# Patient Record
Sex: Female | Born: 1990 | Race: Black or African American | Hispanic: No | Marital: Single | Smoking: Former smoker
Health system: Southern US, Community
[De-identification: ages and names within clinical notes are randomized; demographics above are authoritative.]

## PROBLEM LIST (undated history)

## (undated) DIAGNOSIS — F419 Anxiety disorder, unspecified: Secondary | ICD-10-CM

## (undated) DIAGNOSIS — M722 Plantar fascial fibromatosis: Secondary | ICD-10-CM

## (undated) DIAGNOSIS — E282 Polycystic ovarian syndrome: Secondary | ICD-10-CM

## (undated) DIAGNOSIS — K219 Gastro-esophageal reflux disease without esophagitis: Secondary | ICD-10-CM

## (undated) DIAGNOSIS — M5416 Radiculopathy, lumbar region: Secondary | ICD-10-CM

## (undated) DIAGNOSIS — D649 Anemia, unspecified: Secondary | ICD-10-CM

## (undated) DIAGNOSIS — R51 Headache: Secondary | ICD-10-CM

## (undated) DIAGNOSIS — R519 Headache, unspecified: Secondary | ICD-10-CM

## (undated) DIAGNOSIS — F32A Depression, unspecified: Secondary | ICD-10-CM

## (undated) HISTORY — PX: SMALL INTESTINE SURGERY: SHX150

## (undated) HISTORY — PX: FOOT SURGERY: SHX648

## (undated) HISTORY — PX: WISDOM TOOTH EXTRACTION: SHX21

## (undated) HISTORY — PX: HERNIA REPAIR: SHX51

---
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
## 2008-05-24 ENCOUNTER — Emergency Department: Payer: Self-pay | Admitting: Emergency Medicine

## 2008-07-10 ENCOUNTER — Emergency Department: Payer: Self-pay | Admitting: Emergency Medicine

## 2008-09-01 ENCOUNTER — Emergency Department: Payer: Self-pay | Admitting: Emergency Medicine

## 2009-07-21 ENCOUNTER — Emergency Department: Payer: Self-pay | Admitting: Emergency Medicine

## 2009-07-21 ENCOUNTER — Emergency Department: Payer: Self-pay | Admitting: Internal Medicine

## 2009-08-03 ENCOUNTER — Ambulatory Visit: Payer: Self-pay | Admitting: Family Medicine

## 2009-08-24 ENCOUNTER — Ambulatory Visit: Payer: Self-pay | Admitting: Physician Assistant

## 2009-09-09 ENCOUNTER — Emergency Department: Payer: Self-pay | Admitting: Emergency Medicine

## 2009-11-28 ENCOUNTER — Emergency Department: Payer: Self-pay | Admitting: Emergency Medicine

## 2010-02-13 ENCOUNTER — Observation Stay: Payer: Self-pay | Admitting: Advanced Practice Midwife

## 2010-03-30 ENCOUNTER — Inpatient Hospital Stay: Payer: Self-pay | Admitting: Obstetrics and Gynecology

## 2010-07-08 ENCOUNTER — Emergency Department (HOSPITAL_COMMUNITY): Payer: Medicaid Other

## 2010-07-08 ENCOUNTER — Emergency Department (HOSPITAL_COMMUNITY)
Admission: EM | Admit: 2010-07-08 | Discharge: 2010-07-08 | Disposition: A | Discharge status: Home / Self Care | Payer: Medicaid Other | Attending: Emergency Medicine | Admitting: Emergency Medicine

## 2010-07-08 DIAGNOSIS — R059 Cough, unspecified: Secondary | ICD-10-CM | POA: Insufficient documentation

## 2010-07-08 DIAGNOSIS — R079 Chest pain, unspecified: Secondary | ICD-10-CM | POA: Insufficient documentation

## 2010-07-08 DIAGNOSIS — IMO0001 Reserved for inherently not codable concepts without codable children: Secondary | ICD-10-CM | POA: Insufficient documentation

## 2010-07-08 DIAGNOSIS — J40 Bronchitis, not specified as acute or chronic: Secondary | ICD-10-CM | POA: Insufficient documentation

## 2010-07-08 DIAGNOSIS — R070 Pain in throat: Secondary | ICD-10-CM | POA: Insufficient documentation

## 2010-07-08 DIAGNOSIS — R05 Cough: Secondary | ICD-10-CM | POA: Insufficient documentation

## 2010-07-08 DIAGNOSIS — J3489 Other specified disorders of nose and nasal sinuses: Secondary | ICD-10-CM | POA: Insufficient documentation

## 2010-07-08 LAB — RAPID STREP SCREEN (MED CTR MEBANE ONLY): Streptococcus, Group A Screen (Direct): NEGATIVE

## 2010-08-15 ENCOUNTER — Emergency Department: Payer: Self-pay | Admitting: Internal Medicine

## 2011-03-03 ENCOUNTER — Emergency Department: Payer: Self-pay | Admitting: Emergency Medicine

## 2011-03-03 LAB — CBC
HCT: 40.2 % (ref 35.0–47.0)
HGB: 13.4 g/dL (ref 12.0–16.0)
MCV: 87 fL (ref 80–100)
RDW: 15.2 % — ABNORMAL HIGH (ref 11.5–14.5)
WBC: 13.7 10*3/uL — ABNORMAL HIGH (ref 3.6–11.0)

## 2011-03-03 LAB — URINALYSIS, COMPLETE
Bilirubin,UR: NEGATIVE
Blood: NEGATIVE
Ketone: NEGATIVE
Ph: 6 (ref 4.5–8.0)
Protein: 30
Squamous Epithelial: 2

## 2011-03-03 LAB — PREGNANCY, URINE: Pregnancy Test, Urine: NEGATIVE m[IU]/mL

## 2011-03-03 LAB — COMPREHENSIVE METABOLIC PANEL
Anion Gap: 10 (ref 7–16)
BUN: 9 mg/dL (ref 7–18)
Bilirubin,Total: 0.2 mg/dL (ref 0.2–1.0)
Chloride: 108 mmol/L — ABNORMAL HIGH (ref 98–107)
Creatinine: 0.69 mg/dL (ref 0.60–1.30)
EGFR (African American): >60
Potassium: 4.1 mmol/L (ref 3.5–5.1)
Sodium: 143 mmol/L (ref 136–145)
Total Protein: 7.8 g/dL (ref 6.4–8.2)

## 2011-03-03 LAB — HCG, QUANTITATIVE, PREGNANCY: Beta Hcg, Quant.: <1 m[IU]/mL — ABNORMAL LOW

## 2014-02-20 ENCOUNTER — Encounter: Payer: Self-pay | Admitting: Podiatry

## 2014-02-20 ENCOUNTER — Ambulatory Visit (INDEPENDENT_AMBULATORY_CARE_PROVIDER_SITE_OTHER): Payer: Commercial Managed Care - PPO | Admitting: Podiatry

## 2014-02-20 VITALS — BP 128/71 | HR 92 | Resp 16 | Ht 69.0 in | Wt 270.0 lb

## 2014-02-20 DIAGNOSIS — L600 Ingrowing nail: Secondary | ICD-10-CM

## 2014-02-20 MED ORDER — NEOMYCIN-POLYMYXIN-HC 3.5-10000-1 OT SOLN: OTIC | Status: DC

## 2014-02-20 NOTE — Patient Instructions (Signed)

## 2014-02-20 NOTE — Progress Notes (Signed)
   Subjective:    Patient ID: Crystal Diaz, female    DOB: Aug 10, 1990, 24 y.o.   MRN: [id number]  HPI Comments: Left great toenail has been painful for two weeks. Been peeling at it, the whole toenail hurts, the littlest pressure on it is very painful, even the sheet to touch it      Review of Systems  All other systems reviewed and are negative.      Objective:   Physical Exam: I have reviewed her past medical history medications allergies surgery social history and review of systems. Pulses are strongly palpable bilateral. Neurologic sensorium is intact per Semmes-Weinstein monofilament. Deep tendon reflexes are intact bilaterally muscle strength +5 over 5 dorsiflexion plantar flexors and inverters and everters all intrinsic musculature is intact. Orthopedic evaluation demonstrates all joints distal to the ankle level range of motion without crepitation. Cutaneous evaluation demonstrates supple well-hydrated cutis with a sharply incurvated nail margin along the tibial border of the hallux right close erythema and pain on palpation. There is no purulence no malodor no granulation tissue.        Assessment & Plan:  Assessment: Ingrown nail paronychia abscess hallux right.  Plan: Chemical matrixectomy was performed today after local anesthesia was achieved. She tolerated this procedure well. She was given both oral and written home-going instructions for the care and soaking of this toe. I will follow-up with her 1 week.  Remember to ask her about the painful lesion on the plantar aspect of the left foot.

## 2014-02-21 ENCOUNTER — Telehealth: Payer: Self-pay | Admitting: Podiatry

## 2014-02-21 NOTE — Telephone Encounter (Signed)
Spoke to [name] , she stated that her toe woke her up in the middle of the night with the toe throbbing. She soaked the toe last night in plain water and neosporin. Told ms Sarno she needs to soak the toe in betadine and also get the drops from the pharmacy, told her to take tylenol or ibuprofen for pain

## 2014-02-21 NOTE — Telephone Encounter (Signed)
Patient saw Dr. Al CorpusHyatt yesterday and is stating that her toe is still numb but that she is also in a lot of pain.

## 2014-02-27 ENCOUNTER — Ambulatory Visit: Payer: Commercial Managed Care - PPO | Admitting: Podiatry

## 2014-03-01 ENCOUNTER — Ambulatory Visit (INDEPENDENT_AMBULATORY_CARE_PROVIDER_SITE_OTHER): Payer: Commercial Managed Care - PPO | Admitting: Podiatry

## 2014-03-01 ENCOUNTER — Ambulatory Visit (INDEPENDENT_AMBULATORY_CARE_PROVIDER_SITE_OTHER): Payer: Commercial Managed Care - PPO

## 2014-03-01 VITALS — BP 134/82 | HR 106 | Resp 16

## 2014-03-01 DIAGNOSIS — M722 Plantar fascial fibromatosis: Secondary | ICD-10-CM

## 2014-03-01 DIAGNOSIS — L600 Ingrowing nail: Secondary | ICD-10-CM

## 2014-03-01 MED ORDER — METHYLPREDNISOLONE (PAK) 4 MG PO TABS: ORAL_TABLET | ORAL | Status: DC

## 2014-03-01 MED ORDER — MELOXICAM 15 MG PO TABS: 15.0000 mg | ORAL_TABLET | Freq: Every day | ORAL | Status: DC

## 2014-03-01 NOTE — Progress Notes (Signed)
Mr. Yetta BarreJones presents today for follow-up of matrixectomy hallux right. She denies fever chills nausea vomiting muscle aches and pains. Continues to sent to an Betadine in warm water. She continues to apply Cortisporin otic twice daily and cover. She's also complaining of bilateral heel pain. She states this been going on for quite some time and seems to be intractable.  Objective: Vital signs are stable she's alert and oriented 3 pulses are palpable bilateral pain. Hallux right does demonstrate a well healing surgical to right. No erythema no granulation tissue no malodor. She does have pain on palpation medial calcaneal tubercles bilateral. Radiographic evaluation does demonstrate a soft tissue increase in density at the plantar fascial calcaneal insertion site. This is indicative of plantar fasciitis.  Assessment: Well-healing surgical to hallux right. Plantar fasciitis bilateral.  Plan: We discussed the etiology pathology conservative versus surgical therapies. At this point I injected her bilateral heels today with Kenalog and local anesthetic. Dispensed the plantar fascial brace bilaterally and a night splint to her worst foot. We did also discussed stretching exercises ice therapy shoe gear modifications. We wrote a prescription for a Medrol Dosepak to be followed by meloxicam. For her right great toe we will discontinue Betadine and start with Epson salt warm water soaks covered in the day and leave open at night. She will continue Cortisporin Otic. I will follow-up with her in 1 month.

## 2014-03-29 ENCOUNTER — Ambulatory Visit: Payer: Commercial Managed Care - PPO | Admitting: Podiatry

## 2015-02-28 ENCOUNTER — Encounter: Payer: Self-pay | Admitting: Physician Assistant

## 2015-02-28 ENCOUNTER — Ambulatory Visit: Payer: Self-pay | Admitting: Physician Assistant

## 2015-02-28 VITALS — BP 124/80 | HR 84 | Temp 98.5°F

## 2015-02-28 DIAGNOSIS — R05 Cough: Secondary | ICD-10-CM

## 2015-02-28 DIAGNOSIS — R059 Cough, unspecified: Secondary | ICD-10-CM

## 2015-02-28 LAB — POCT INFLUENZA A/B
INFLUENZA B, POC: NEGATIVE
Influenza A, POC: NEGATIVE

## 2015-02-28 MED ORDER — AZITHROMYCIN 250 MG PO TABS: ORAL_TABLET | ORAL | Status: DC

## 2015-02-28 MED ORDER — HYDROCOD POLST-CPM POLST ER 10-8 MG/5ML PO SUER: 5.0000 mL | Freq: Two times a day (BID) | ORAL | Status: DC | PRN

## 2015-02-28 NOTE — Progress Notes (Signed)
S: C/o runny nose and congestion with dry cough, some chest tightness,  for 3 days, ?fever, chills, denies cp/sob, v/d; mucus was green this am but clear throughout the day, cough is sporadic,   Using otc meds: mucinex  O: PE: vitals wnl, nad, perrl eomi, normocephalic, tms dull, nasal mucosa red and swollen, throat injected, neck supple no lymph, lungs c t a, cv rrr, neuro intact, flu swab neg  A:  Acute  uri   P: zpack, tussionex; nrdrink fluids, continue regular meds , use otc meds of choice, return if not improving in 5 days, return earlier if worsening

## 2015-05-01 DIAGNOSIS — H52223 Regular astigmatism, bilateral: Secondary | ICD-10-CM | POA: Diagnosis not present

## 2015-06-19 ENCOUNTER — Encounter: Payer: Self-pay | Admitting: Obstetrics and Gynecology

## 2015-10-17 DIAGNOSIS — Z7689 Persons encountering health services in other specified circumstances: Secondary | ICD-10-CM | POA: Diagnosis not present

## 2015-10-17 DIAGNOSIS — Z01419 Encounter for gynecological examination (general) (routine) without abnormal findings: Secondary | ICD-10-CM | POA: Diagnosis not present

## 2015-10-18 DIAGNOSIS — Z13220 Encounter for screening for lipoid disorders: Secondary | ICD-10-CM | POA: Diagnosis not present

## 2015-10-18 DIAGNOSIS — Z131 Encounter for screening for diabetes mellitus: Secondary | ICD-10-CM | POA: Diagnosis not present

## 2015-10-18 DIAGNOSIS — Z1321 Encounter for screening for nutritional disorder: Secondary | ICD-10-CM | POA: Diagnosis not present

## 2015-10-18 DIAGNOSIS — Z113 Encounter for screening for infections with a predominantly sexual mode of transmission: Secondary | ICD-10-CM | POA: Diagnosis not present

## 2015-10-31 DIAGNOSIS — E282 Polycystic ovarian syndrome: Secondary | ICD-10-CM | POA: Diagnosis not present

## 2015-11-01 DIAGNOSIS — Z1321 Encounter for screening for nutritional disorder: Secondary | ICD-10-CM | POA: Diagnosis not present

## 2015-11-01 DIAGNOSIS — Z131 Encounter for screening for diabetes mellitus: Secondary | ICD-10-CM | POA: Diagnosis not present

## 2015-11-01 DIAGNOSIS — Z136 Encounter for screening for cardiovascular disorders: Secondary | ICD-10-CM | POA: Diagnosis not present

## 2015-11-01 DIAGNOSIS — Z113 Encounter for screening for infections with a predominantly sexual mode of transmission: Secondary | ICD-10-CM | POA: Diagnosis not present

## 2015-11-01 DIAGNOSIS — Z13220 Encounter for screening for lipoid disorders: Secondary | ICD-10-CM | POA: Diagnosis not present

## 2015-11-06 DIAGNOSIS — E282 Polycystic ovarian syndrome: Secondary | ICD-10-CM | POA: Diagnosis not present

## 2015-11-06 DIAGNOSIS — Z3049 Encounter for surveillance of other contraceptives: Secondary | ICD-10-CM | POA: Diagnosis not present

## 2016-02-05 DIAGNOSIS — E282 Polycystic ovarian syndrome: Secondary | ICD-10-CM | POA: Diagnosis not present

## 2016-02-05 DIAGNOSIS — E559 Vitamin D deficiency, unspecified: Secondary | ICD-10-CM | POA: Diagnosis not present

## 2016-02-11 DIAGNOSIS — Z3009 Encounter for other general counseling and advice on contraception: Secondary | ICD-10-CM | POA: Diagnosis not present

## 2016-05-19 DIAGNOSIS — Z309 Encounter for contraceptive management, unspecified: Secondary | ICD-10-CM | POA: Diagnosis not present

## 2016-06-12 DIAGNOSIS — H52223 Regular astigmatism, bilateral: Secondary | ICD-10-CM | POA: Diagnosis not present

## 2016-06-26 ENCOUNTER — Ambulatory Visit: Payer: Medicaid Other | Admitting: Internal Medicine

## 2016-07-01 ENCOUNTER — Ambulatory Visit: Payer: Self-pay | Admitting: Internal Medicine

## 2016-08-07 DIAGNOSIS — R102 Pelvic and perineal pain: Secondary | ICD-10-CM | POA: Diagnosis not present

## 2016-08-07 DIAGNOSIS — E559 Vitamin D deficiency, unspecified: Secondary | ICD-10-CM | POA: Diagnosis not present

## 2016-08-07 DIAGNOSIS — D509 Iron deficiency anemia, unspecified: Secondary | ICD-10-CM | POA: Diagnosis not present

## 2016-09-10 DIAGNOSIS — N912 Amenorrhea, unspecified: Secondary | ICD-10-CM | POA: Diagnosis not present

## 2016-09-18 DIAGNOSIS — Z3009 Encounter for other general counseling and advice on contraception: Secondary | ICD-10-CM | POA: Diagnosis not present

## 2016-09-23 DIAGNOSIS — N921 Excessive and frequent menstruation with irregular cycle: Secondary | ICD-10-CM | POA: Diagnosis not present

## 2016-11-04 DIAGNOSIS — R635 Abnormal weight gain: Secondary | ICD-10-CM | POA: Diagnosis not present

## 2016-12-11 ENCOUNTER — Other Ambulatory Visit (HOSPITAL_COMMUNITY): Payer: Self-pay | Admitting: Surgery

## 2016-12-11 ENCOUNTER — Other Ambulatory Visit: Payer: Self-pay | Admitting: Surgery

## 2016-12-17 ENCOUNTER — Ambulatory Visit
Admission: RE | Admit: 2016-12-17 | Discharge: 2016-12-17 | Disposition: A | Discharge status: Home / Self Care | Payer: 59 | Source: Ambulatory Visit | Attending: Surgery | Admitting: Surgery

## 2016-12-17 ENCOUNTER — Encounter: Payer: Self-pay | Admitting: Dietician

## 2016-12-17 ENCOUNTER — Encounter: Payer: 59 | Attending: Surgery | Admitting: Dietician

## 2016-12-17 VITALS — Ht 70.0 in | Wt 313.2 lb

## 2016-12-17 DIAGNOSIS — E282 Polycystic ovarian syndrome: Secondary | ICD-10-CM | POA: Diagnosis not present

## 2016-12-17 DIAGNOSIS — F17200 Nicotine dependence, unspecified, uncomplicated: Secondary | ICD-10-CM | POA: Insufficient documentation

## 2016-12-17 DIAGNOSIS — K449 Diaphragmatic hernia without obstruction or gangrene: Secondary | ICD-10-CM | POA: Insufficient documentation

## 2016-12-17 DIAGNOSIS — Z6841 Body Mass Index (BMI) 40.0 and over, adult: Secondary | ICD-10-CM | POA: Insufficient documentation

## 2016-12-17 DIAGNOSIS — N2889 Other specified disorders of kidney and ureter: Secondary | ICD-10-CM | POA: Insufficient documentation

## 2016-12-17 DIAGNOSIS — Z713 Dietary counseling and surveillance: Secondary | ICD-10-CM | POA: Diagnosis not present

## 2016-12-17 DIAGNOSIS — Z800 Family history of malignant neoplasm of digestive organs: Secondary | ICD-10-CM | POA: Diagnosis not present

## 2016-12-17 DIAGNOSIS — Z01818 Encounter for other preprocedural examination: Secondary | ICD-10-CM | POA: Diagnosis not present

## 2016-12-17 NOTE — Progress Notes (Signed)
Nutritional Assessment   Date: 12/17/16    Proposed Surgery: Roux-en-Y  Re: Crystal Diaz    DOB:  March 25, 1990   MRN: [id number] MD: Elgie Collard RD:  Clint Bolder RD, LDN  Height _0   Weight 313.2lb  BMI 44.94  Upper IBW/% UIBW 152%  Patient's goal weight 200#  Medical History Obesity, GERD, see chart  Medications/Supplements Phentermine  Previous Surgeries Wisdom teeth  Drug Allergies None known per patient  Food Allergies None known per patient  Drink Alcohol Rarely  Smoke Cigarettes Occasionally, trying to quit  Physical Activity Not at this time but plans to start in the future  Weight History Childhood: overweight but lean, describes always being larger and weighing than "everybody else" in her classes Adolescence: overweight Early adulthood: first attempt at weight loss via [employer] diet and exercise program. Was losing weight on this program until she became pregnant; stopped stopped exercising at this time  Dieting/Weight Loss History First attempt at weight reduction was in her early 77s, joined [employer] and started exercising. Reported some weight loss success until she became pregnant, at which time she stopped the program and stopped exercising. Unable to lose weight after pregnancy. 2015 started seeing a weight loss doctor who prescribed Phentermine and meal plan. Co-pay became too large and she stopped going. Current MD has restarted her on Phentermine. Currently trying to follow a low-carb diet but admits that this past month she has returned to some less- nutritious eating habits. She feels that no matter what she tries she is unable to lose weight and does not have the energy to exercise at her current weight.   Dietary Recall/Food and Fluid: Ms Dalto lives with her son and fiancee.  She and her fiancee perform the food shopping and her fiancee prepares most meals.  Dining out: 7 meals/ week  Typical daily eating pattern: 1-2 meals and 1-2 snacks Breakfast: ice  cream, bacon or bacon sandwich, eggs, [employer]ik[employer] yogurt Snack: nuts, chips 6-10x/month Lunch: skips or has a snack item like chips or ice cream.  Snack: n/a Supper: fiancee has been cooking at home. Doesn't always finish the meal due to lack of hunger. Foods are often fried but they are trying to use the air fry more often. Typical items include Mac n' cheese, gravy and other "soul foods" + vegetables Snack: ice cream Beverages: water, some soda, sweet tea  Psychosocial:                                                                                                                    She denies binge eating, emotional eating or other disordered eating behaviors. Does report feelings of depression due to her weight.  Instruction:  Ms. Carns has been instructed on the following: . Pre-op dietary changes, particularly beverage choices and eliminating caffeine and carbonated beverages.  . Pre-op liver reduction diet.  . Overview of post-op diet changes, including importance of hydration and adequate protein. Marland Kitchen She was commended for the changes she has made thus far  She was provided nutrition education materials including: Pre-op goals, Pre-op diet for liver reduction, Protein drink options . She has researched this procedure and its complications by: confiding in friends who have had weight loss surgery . She voices understanding of the instruction listed above  Summary: Ms. Hacker appears very motivated for surgery, as she has already started to make diet and lifestyle changes to achieve weight loss.  She will continue to work on quitting smoking, eating more regularly and eliminating sugar sweetened beverages. Due to these factors and solid support from friends and family, from a nutrition standpoint, she is ready to proceed with the bariatric surgery process.                                                                                                                   Plan: The patient is requested to participate in pre- and post-operative classes, as well as a post-op RD visit 2-3 weeks after surgery.

## 2017-01-21 DIAGNOSIS — Z01419 Encounter for gynecological examination (general) (routine) without abnormal findings: Secondary | ICD-10-CM | POA: Diagnosis not present

## 2017-01-21 DIAGNOSIS — Z124 Encounter for screening for malignant neoplasm of cervix: Secondary | ICD-10-CM | POA: Diagnosis not present

## 2017-01-27 ENCOUNTER — Ambulatory Visit (INDEPENDENT_AMBULATORY_CARE_PROVIDER_SITE_OTHER): Payer: 59 | Admitting: Psychiatry

## 2017-01-27 DIAGNOSIS — F509 Eating disorder, unspecified: Secondary | ICD-10-CM

## 2017-01-29 ENCOUNTER — Ambulatory Visit (INDEPENDENT_AMBULATORY_CARE_PROVIDER_SITE_OTHER): Payer: 59 | Admitting: Psychiatry

## 2017-01-29 DIAGNOSIS — F509 Eating disorder, unspecified: Secondary | ICD-10-CM | POA: Diagnosis not present

## 2017-02-27 ENCOUNTER — Encounter: Payer: 59 | Attending: Surgery

## 2017-02-27 VITALS — Wt 314.5 lb

## 2017-02-27 DIAGNOSIS — E282 Polycystic ovarian syndrome: Secondary | ICD-10-CM | POA: Insufficient documentation

## 2017-02-27 DIAGNOSIS — F17200 Nicotine dependence, unspecified, uncomplicated: Secondary | ICD-10-CM | POA: Insufficient documentation

## 2017-02-27 DIAGNOSIS — Z713 Dietary counseling and surveillance: Secondary | ICD-10-CM | POA: Insufficient documentation

## 2017-02-27 DIAGNOSIS — Z6841 Body Mass Index (BMI) 40.0 and over, adult: Secondary | ICD-10-CM | POA: Diagnosis not present

## 2017-02-27 DIAGNOSIS — Z800 Family history of malignant neoplasm of digestive organs: Secondary | ICD-10-CM | POA: Insufficient documentation

## 2017-02-27 NOTE — Progress Notes (Signed)
Pre-Operative Nutrition Class:  Appt start time: 0900   End time:  1000.  Patient was seen on 02/27/17 for Pre-Operative Bariatric Surgery Education at [facility]   Surgery date: 03/17/17 Surgery type: Roux-en-Y Start weight at [facility]: 313.2# Weight today: 314.5.0#   Samples given per MNT protocol. Patient educated on appropriate usage: Cele[employer] Vitamins Multivitamin Lot # not listed on packaging Exp: not listed on packaging  [employer]ele[employer] Vitamins Calcium Citrate Lot # not listed on packaging Exp: not listed on packaging  [place] Lot # [id number] Exp: 06/08/17  [employer] Protein Clear Lot # [id number] Exp: 12/31/17  The following the learning objectives were met by the patient during this course:  Identify Pre-Op Dietary Goals and will begin 2 weeks pre-operatively  Identify appropriate sources of fluids and proteins   State protein recommendations and appropriate sources pre and post-operatively  Identify Post-Operative Dietary Goals and will follow for 2 weeks post-operatively  Identify appropriate multivitamin and calcium sources  Describe the need for physical activity post-operatively and will follow MD recommendations  State when to call healthcare provider regarding medication questions or post-operative complications  Handouts given during class include:  Pre-Op Bariatric Surgery Diet Handout  Protein Shake Handout  Post-Op Bariatric Surgery Nutrition Handout  [facility] Program Information Flyer  Support Group Information Flyer  [facility] Bariatric Supplements Price List  Follow-Up Plan: Patient will follow-up at [facility] 2 weeks post operatively for diet advancement per MD.

## 2017-03-04 ENCOUNTER — Ambulatory Visit: Payer: Self-pay | Admitting: Surgery

## 2017-03-04 DIAGNOSIS — Z0000 Encounter for general adult medical examination without abnormal findings: Secondary | ICD-10-CM | POA: Diagnosis not present

## 2017-03-04 NOTE — H&P (Signed)
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  She has had recent diagnosis of GERD and reflux.  She also has some polycystic ovary issues.  We have discussed sleeve and a pass in detail and she chose to move forward with a gastric bypass.  This is scheduled for March 5.  Informed consent was obtained and questions were answered in the office when she was seen on February 20. No past medical history on file.  No past surgical history on file.  Current Outpatient Medications  Medication Sig Dispense Refill  . azithromycin (ZITHROMAX) 250 MG tablet 2 pills today then 1 pill a day for 4 days (Patient not taking: Reported on 12/17/2016) 6 tablet 0  . chlorpheniramine-HYDROcodone (TUSSIONEX PENNKINETIC ER) 10-8 MG/5ML SUER Take 5 mLs by mouth every 12 (twelve) hours as needed for cough. (Patient not taking: Reported on 12/17/2016) 150 mL 0  . ibuprofen (ADVIL,MOTRIN) 200 MG tablet Take 200 mg by mouth every 8 (eight) hours as needed for mild pain.     . MedroxyPROGESTERone Acetate 150 MG/ML SUSY Inject 150 mg into the skin every 3 (three) months.   3  . meloxicam (MOBIC) 15 MG tablet Take 1 tablet (15 mg total) by mouth daily. (Patient not taking: Reported on 02/28/2015) 30 tablet 3  . methylPREDNIsolone (MEDROL DOSPACK) 4 MG tablet follow package directions 21 tablet 0  . neomycin-polymyxin-hydrocortisone (CORTISPORIN) otic solution 1- 2 drops  to the toe after soaking twice daily (Patient not taking: Reported on 02/28/2015) 10 mL 1   No current facility-administered medications for this visit.    Patient has no known allergies. No family history on file. Social History:   reports that she has been smoking.  She has quit using smokeless tobacco. Her alcohol and drug histories are not on file.   REVIEW OF SYSTEMS : Negative except for  GERD and POS  Physical Exam:   There were no vitals taken for this visit. There is no height or weight on file to calculate BMI.  Gen:  WDWN AAF NAD  Neurological: Alert and oriented to person, place, and time. Motor and sensory function is grossly intact  Head: Normocephalic and atraumatic.  Eyes: Conjunctivae are normal. Pupils are equal, round, and reactive to light. No scleral icterus.  Neck: Normal range of motion. Neck supple. No tracheal deviation or thyromegaly present.  Cardiovascular:  SR without murmurs or gallops.  No carotid bruits Breast:  Not examined Respiratory: Effort normal.  No respiratory distress. No chest wall tenderness. Breath sounds normal.  No wheezes, rales or rhonchi.  Abdomen:  nontender and obese GU:  Not examined Musculoskeletal: Normal range of motion. Extremities are nontender. No cyanosis, edema or clubbing noted Lymphadenopathy: No cervical, preauricular, postauricular or axillary adenopathy is present Skin: Skin is warm and dry. No rash noted. No diaphoresis. No erythema. No pallor. Pscyh: Normal mood and affect. Behavior is normal. Judgment and thought content normal.   LABORATORY RESULTS: No results found for this or any previous visit (from the past 48 hour(s)).   RADIOLOGY RESULTS: No results found.  Problem List: There are no active problems to display for this patient.   Assessment & Plan: Morbid obesity BMI 44 for lap Roux en Y gastric bypass    Matt B. Daphine DeutscherMartin, MD, [facility]  [facility] [phone]  beeper [phone]  03/04/2017 11:20 AM

## 2017-03-04 NOTE — H&P (View-Only) (Signed)
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  She has had recent diagnosis of GERD and reflux.  She also has some polycystic ovary issues.  We have discussed sleeve and a pass in detail and she chose to move forward with a gastric bypass.  This is scheduled for March 5.  Informed consent was obtained and questions were answered in the office when she was seen on February 20. No past medical history on file.  No past surgical history on file.  Current Outpatient Medications  Medication Sig Dispense Refill  . azithromycin (ZITHROMAX) 250 MG tablet 2 pills today then 1 pill a day for 4 days (Patient not taking: Reported on 12/17/2016) 6 tablet 0  . chlorpheniramine-HYDROcodone (TUSSIONEX PENNKINETIC ER) 10-8 MG/5ML SUER Take 5 mLs by mouth every 12 (twelve) hours as needed for cough. (Patient not taking: Reported on 12/17/2016) 150 mL 0  . ibuprofen (ADVIL,MOTRIN) 200 MG tablet Take 200 mg by mouth every 8 (eight) hours as needed for mild pain.     . MedroxyPROGESTERone Acetate 150 MG/ML SUSY Inject 150 mg into the skin every 3 (three) months.   3  . meloxicam (MOBIC) 15 MG tablet Take 1 tablet (15 mg total) by mouth daily. (Patient not taking: Reported on 02/28/2015) 30 tablet 3  . methylPREDNIsolone (MEDROL DOSPACK) 4 MG tablet follow package directions 21 tablet 0  . neomycin-polymyxin-hydrocortisone (CORTISPORIN) otic solution 1- 2 drops  to the toe after soaking twice daily (Patient not taking: Reported on 02/28/2015) 10 mL 1   No current facility-administered medications for this visit.    Patient has no known allergies. No family history on file. Social History:   reports that she has been smoking.  She has quit using smokeless tobacco. Her alcohol and drug histories are not on file.   REVIEW OF SYSTEMS : Negative except for  GERD and POS  Physical Exam:   There were no vitals taken for this visit. There is no height or weight on file to calculate BMI.  Gen:  WDWN AAF NAD  Neurological: Alert and oriented to person, place, and time. Motor and sensory function is grossly intact  Head: Normocephalic and atraumatic.  Eyes: Conjunctivae are normal. Pupils are equal, round, and reactive to light. No scleral icterus.  Neck: Normal range of motion. Neck supple. No tracheal deviation or thyromegaly present.  Cardiovascular:  SR without murmurs or gallops.  No carotid bruits Breast:  Not examined Respiratory: Effort normal.  No respiratory distress. No chest wall tenderness. Breath sounds normal.  No wheezes, rales or rhonchi.  Abdomen:  nontender and obese GU:  Not examined Musculoskeletal: Normal range of motion. Extremities are nontender. No cyanosis, edema or clubbing noted Lymphadenopathy: No cervical, preauricular, postauricular or axillary adenopathy is present Skin: Skin is warm and dry. No rash noted. No diaphoresis. No erythema. No pallor. Pscyh: Normal mood and affect. Behavior is normal. Judgment and thought content normal.   LABORATORY RESULTS: No results found for this or any previous visit (from the past 48 hour(s)).   RADIOLOGY RESULTS: No results found.  Problem List: There are no active problems to display for this patient.   Assessment & Plan: Morbid obesity BMI 44 for lap Roux en Y gastric bypass    Matt B. Daphine DeutscherMartin, MD, [facility]  [facility] [phone]  beeper [phone]  03/04/2017 11:20 AM

## 2017-03-09 ENCOUNTER — Encounter (HOSPITAL_COMMUNITY): Payer: Self-pay

## 2017-03-09 NOTE — Pre-Procedure Instructions (Signed)
The following are in epic:  KUB 12/17/2016 CXR 12/17/2016

## 2017-03-09 NOTE — Patient Instructions (Signed)
Your procedure is scheduled on: Tuesday, March 17, 2017   Surgery Time:  7:30AM-11:30AM   Report to [facility] [place] Main  Entrance   Arrive by 5:30 AM  pick up the phone at the front desk dial [phone], have a seat in the lobby and a staff member from Short stay will come and escort you to Short Stay Department   Call this number if you have problems the morning of surgery [phone]   Do not eat food or drink liquids :After Midnight.   Do NOT smoke after Midnight   Complete one Ensure drink the morning of surgery 3 hours prior to scheduled surgery ( 4:30AM).   Take these medicines the morning of surgery with A SIP OF WATER: None              You may not have any metal on your body including hair pins, jewelry, and body piercings             Do not wear make-up, lotions, powders, perfumes/cologne, or deodorant             Do not wear nail polish.  Do not shave  48 hours prior to surgery.               Do not bring valuables to the hospital. [facility] IS NOT             RESPONSIBLE   FOR VALUABLES.   Contacts, dentures or bridgework may not be worn into surgery.   Leave suitcase in the car. After surgery it may be brought to your room.   Special Instructions: Bring a copy of your healthcare power of attorney and living will documents         the day of surgery if you haven't scanned them in before.              Please read over the following fact sheets you were given:   [facility] - Preparing for Surgery Before surgery, you can play an important role.  Because skin is not sterile, your skin needs to be as free of germs as possible.  You can reduce the number of germs on your skin by washing with CHG (chlorahexidine gluconate) soap before surgery.  CHG is an antiseptic cleaner which kills germs and bonds with the skin to continue killing germs even after washing. Please DO NOT use if you have an allergy to CHG or antibacterial soaps.  If your skin becomes  reddened/irritated stop using the CHG and inform your nurse when you arrive at Short Stay. Do not shave (including legs and underarms) for at least 48 hours prior to the first CHG shower.  You may shave your face/neck.  Please follow these instructions carefully:  1.  Shower with CHG Soap the night before surgery and the  morning of surgery.  2.  If you choose to wash your hair, wash your hair first as usual with your normal  shampoo.  3.  After you shampoo, rinse your hair and body thoroughly to remove the shampoo.                             4.  Use CHG as you would any other liquid soap.  You can apply chg directly to the skin and wash.  Gently with a scrungie or clean washcloth.  5.  Apply the CHG Soap to your body ONLY FROM  THE NECK DOWN.   Do   not use on face/ open                           Wound or open sores. Avoid contact with eyes, ears mouth and   genitals (private parts).                       Wash face,  Genitals (private parts) with your normal soap.             6.  Wash thoroughly, paying special attention to the area where your    surgery  will be performed.  7.  Thoroughly rinse your body with warm water from the neck down.  8.  DO NOT shower/wash with your normal soap after using and rinsing off the CHG Soap.                9.  Pat yourself dry with a clean towel.            10.  Wear clean pajamas.            11.  Place clean sheets on your bed the night of your first shower and do not  sleep with pets. Day of Surgery : Do not apply any lotions/deodorants the morning of surgery.  Please wear clean clothes to the hospital/surgery center.  FAILURE TO FOLLOW THESE INSTRUCTIONS MAY RESULT IN THE CANCELLATION OF YOUR SURGERY  PATIENT SIGNATURE_________________________________  NURSE SIGNATURE__________________________________  ________________________________________________________________________   Crystal MireIncentive Spirometer  An incentive spirometer is a tool that can help  keep your lungs clear and active. This tool measures how well you are filling your lungs with each breath. Taking long deep breaths may help reverse or decrease the chance of developing breathing (pulmonary) problems (especially infection) following:  A long period of time when you are unable to move or be active. BEFORE THE PROCEDURE   If the spirometer includes an indicator to show your best effort, your nurse or respiratory therapist will set it to a desired goal.  If possible, sit up straight or lean slightly forward. Try not to slouch.  Hold the incentive spirometer in an upright position. INSTRUCTIONS FOR USE  1. Sit on the edge of your bed if possible, or sit up as far as you can in bed or on a chair. 2. Hold the incentive spirometer in an upright position. 3. Breathe out normally. 4. Place the mouthpiece in your mouth and seal your lips tightly around it. 5. Breathe in slowly and as deeply as possible, raising the piston or the ball toward the top of the column. 6. Hold your breath for 3-5 seconds or for as long as possible. Allow the piston or ball to fall to the bottom of the column. 7. Remove the mouthpiece from your mouth and breathe out normally. 8. Rest for a few seconds and repeat Steps 1 through 7 at least 10 times every 1-2 hours when you are awake. Take your time and take a few normal breaths between deep breaths. 9. The spirometer may include an indicator to show your best effort. Use the indicator as a goal to work toward during each repetition. 10. After each set of 10 deep breaths, practice coughing to be sure your lungs are clear. If you have an incision (the cut made at the time of surgery), support your incision when coughing by placing a pillow or rolled up  towels firmly against it. Once you are able to get out of bed, walk around indoors and cough well. You may stop using the incentive spirometer when instructed by your caregiver.  RISKS AND COMPLICATIONS  Take your  time so you do not get dizzy or light-headed.  If you are in pain, you may need to take or ask for pain medication before doing incentive spirometry. It is harder to take a deep breath if you are having pain. AFTER USE  Rest and breathe slowly and easily.  It can be helpful to keep track of a log of your progress. Your caregiver can provide you with a simple table to help with this. If you are using the spirometer at home, follow these instructions: SEEK MEDICAL CARE IF:   You are having difficultly using the spirometer.  You have trouble using the spirometer as often as instructed.  Your pain medication is not giving enough relief while using the spirometer.  You develop fever of 100.5 F (38.1 C) or higher. SEEK IMMEDIATE MEDICAL CARE IF:   You cough up bloody sputum that had not been present before.  You develop fever of 102 F (38.9 C) or greater.  You develop worsening pain at or near the incision site. MAKE SURE YOU:   Understand these instructions.  Will watch your condition.  Will get help right away if you are not doing well or get worse. Document Released: 05/12/2006 Document Revised: 03/24/2011 Document Reviewed: 07/13/2006 Exit[employer] Patient Information 2014 Exit[employer], [place].   ________________________________________________________________________  WHAT IS A BLOOD TRANSFUSION? Blood Transfusion Information  A transfusion is the replacement of blood or some of its parts. Blood is made up of multiple cells which provide different functions.  Red blood cells carry oxygen and are used for blood loss replacement.  White blood cells fight against infection.  Platelets control bleeding.  Plasma helps clot blood.  Other blood products are available for specialized needs, such as hemophilia or other clotting disorders. BEFORE THE TRANSFUSION  Who gives blood for transfusions?   Healthy volunteers who are fully evaluated to make sure their blood is safe. This  is blood bank blood. Transfusion therapy is the safest it has ever been in the practice of medicine. Before blood is taken from a donor, a complete history is taken to make sure that person has no history of diseases nor engages in risky social behavior (examples are intravenous drug use or sexual activity with multiple partners). The donor's travel history is screened to minimize risk of transmitting infections, such as malaria. The donated blood is tested for signs of infectious diseases, such as HIV and hepatitis. The blood is then tested to be sure it is compatible with you in order to minimize the chance of a transfusion reaction. If you or a relative donates blood, this is often done in anticipation of surgery and is not appropriate for emergency situations. It takes many days to process the donated blood. RISKS AND COMPLICATIONS Although transfusion therapy is very safe and saves many lives, the main dangers of transfusion include:   Getting an infectious disease.  Developing a transfusion reaction. This is an allergic reaction to something in the blood you were given. Every precaution is taken to prevent this. The decision to have a blood transfusion has been considered carefully by your caregiver before blood is given. Blood is not given unless the benefits outweigh the risks. AFTER THE TRANSFUSION  Right after receiving a blood transfusion, you will usually feel much  better and more energetic. This is especially true if your red blood cells have gotten low (anemic). The transfusion raises the level of the red blood cells which carry oxygen, and this usually causes an energy increase.  The nurse administering the transfusion will monitor you carefully for complications. HOME CARE INSTRUCTIONS  No special instructions are needed after a transfusion. You may find your energy is better. Speak with your caregiver about any limitations on activity for underlying diseases you may have. SEEK  MEDICAL CARE IF:   Your condition is not improving after your transfusion.  You develop redness or irritation at the intravenous (IV) site. SEEK IMMEDIATE MEDICAL CARE IF:  Any of the following symptoms occur over the next 12 hours:  Shaking chills.  You have a temperature by mouth above 102 F (38.9 C), not controlled by medicine.  Chest, back, or muscle pain.  People around you feel you are not acting correctly or are confused.  Shortness of breath or difficulty breathing.  Dizziness and fainting.  You get a rash or develop hives.  You have a decrease in urine output.  Your urine turns a dark color or changes to pink, red, or brown. Any of the following symptoms occur over the next 10 days:  You have a temperature by mouth above 102 F (38.9 C), not controlled by medicine.  Shortness of breath.  Weakness after normal activity.  The white part of the eye turns yellow (jaundice).  You have a decrease in the amount of urine or are urinating less often.  Your urine turns a dark color or changes to pink, red, or brown. Document Released: 12/28/1999 Document Revised: 03/24/2011 Document Reviewed: 08/16/2007 [facility] Patient Information 2014 [place], [place].  _______________________________________________________________________

## 2017-03-10 ENCOUNTER — Other Ambulatory Visit: Payer: Self-pay

## 2017-03-10 ENCOUNTER — Encounter (HOSPITAL_COMMUNITY): Payer: Self-pay

## 2017-03-10 ENCOUNTER — Encounter (HOSPITAL_COMMUNITY)
Admission: RE | Admit: 2017-03-10 | Discharge: 2017-03-10 | Disposition: A | Discharge status: Home / Self Care | Payer: 59 | Source: Ambulatory Visit | Attending: Surgery | Admitting: Surgery

## 2017-03-10 DIAGNOSIS — Z01812 Encounter for preprocedural laboratory examination: Secondary | ICD-10-CM | POA: Diagnosis not present

## 2017-03-10 HISTORY — DX: Radiculopathy, lumbar region: M54.16

## 2017-03-10 HISTORY — DX: Anemia, unspecified: D64.9

## 2017-03-10 HISTORY — DX: Polycystic ovarian syndrome: E28.2

## 2017-03-10 HISTORY — DX: Plantar fascial fibromatosis: M72.2

## 2017-03-10 HISTORY — DX: Gastro-esophageal reflux disease without esophagitis: K21.9

## 2017-03-10 HISTORY — DX: Anxiety disorder, unspecified: F41.9

## 2017-03-10 HISTORY — DX: Headache: R51

## 2017-03-10 HISTORY — DX: Headache, unspecified: R51.9

## 2017-03-10 LAB — CBC WITH DIFFERENTIAL/PLATELET
Basophils Absolute: 0 10*3/uL (ref 0.0–0.1)
Basophils Relative: 0 %
EOS ABS: 0 10*3/uL (ref 0.0–0.7)
EOS PCT: 0 %
HCT: 38.1 % (ref 36.0–46.0)
Hemoglobin: 12.8 g/dL (ref 12.0–15.0)
LYMPHS ABS: 2.9 10*3/uL (ref 0.7–4.0)
Lymphocytes Relative: 22 %
MCH: 28.5 pg (ref 26.0–34.0)
MCHC: 33.6 g/dL (ref 30.0–36.0)
MCV: 84.9 fL (ref 78.0–100.0)
Monocytes Absolute: 0.9 10*3/uL (ref 0.1–1.0)
Monocytes Relative: 6 %
Neutro Abs: 9.6 10*3/uL — ABNORMAL HIGH (ref 1.7–7.7)
Neutrophils Relative %: 72 %
Platelets: 341 10*3/uL (ref 150–400)
RBC: 4.49 MIL/uL (ref 3.87–5.11)
RDW: 15 % (ref 11.5–15.5)
WBC: 13.4 10*3/uL — AB (ref 4.0–10.5)

## 2017-03-10 LAB — COMPREHENSIVE METABOLIC PANEL
ALT: 18 U/L (ref 14–54)
ANION GAP: 11 (ref 5–15)
AST: 24 U/L (ref 15–41)
Albumin: 4 g/dL (ref 3.5–5.0)
Alkaline Phosphatase: 49 U/L (ref 38–126)
BUN: 15 mg/dL (ref 6–20)
CHLORIDE: 108 mmol/L (ref 101–111)
CO2: 22 mmol/L (ref 22–32)
CREATININE: 0.82 mg/dL (ref 0.44–1.00)
Calcium: 9.4 mg/dL (ref 8.9–10.3)
GFR calc non Af Amer: >60 mL/min (ref >60)
Glucose, Bld: 92 mg/dL (ref 65–99)
Potassium: 4 mmol/L (ref 3.5–5.1)
Sodium: 141 mmol/L (ref 135–145)
Total Bilirubin: 0.6 mg/dL (ref 0.3–1.2)
Total Protein: 7.8 g/dL (ref 6.5–8.1)

## 2017-03-10 LAB — ABO/RH: ABO/RH(D): B POS

## 2017-03-16 NOTE — Anesthesia Preprocedure Evaluation (Signed)
Anesthesia Evaluation  Patient identified by MRN, date of birth, ID band Patient awake    Reviewed: Allergy & Precautions, H&P , Patient's Chart, lab work & pertinent test results, reviewed documented beta blocker date and time   Airway Mallampati: II  TM Distance: >3 FB Neck ROM: full    Dental no notable dental hx.    Pulmonary former smoker,    Pulmonary exam normal breath sounds clear to auscultation       Cardiovascular  Rhythm:regular Rate:Normal     Neuro/Psych    GI/Hepatic   Endo/Other    Renal/GU      Musculoskeletal   Abdominal   Peds  Hematology   Anesthesia Other Findings   Reproductive/Obstetrics                             Anesthesia Physical Anesthesia Plan  ASA: III  Anesthesia Plan: General   Post-op Pain Management:    Induction: Intravenous  PONV Risk Score and Plan: 3 and Scopolamine patch - Pre-op, Dexamethasone, Ondansetron and Treatment may vary due to age or medical condition  Airway Management Planned: Oral ETT  Additional Equipment:   Intra-op Plan:   Post-operative Plan: Extubation in OR  Informed Consent: I have reviewed the patients History and Physical, chart, labs and discussed the procedure including the risks, benefits and alternatives for the proposed anesthesia with the patient or authorized representative who has indicated his/her understanding and acceptance.   Dental Advisory Given  Plan Discussed with: CRNA and Surgeon  Anesthesia Plan Comments: (  Consider Glide)        Anesthesia Quick Evaluation

## 2017-03-17 ENCOUNTER — Encounter (HOSPITAL_COMMUNITY)
Admission: RE | Disposition: A | Discharge status: Home / Self Care | Payer: Self-pay | Source: Ambulatory Visit | Attending: Surgery

## 2017-03-17 ENCOUNTER — Inpatient Hospital Stay (HOSPITAL_COMMUNITY): Payer: 59 | Admitting: Anesthesiology

## 2017-03-17 ENCOUNTER — Encounter (HOSPITAL_COMMUNITY): Payer: Self-pay | Admitting: *Deleted

## 2017-03-17 ENCOUNTER — Inpatient Hospital Stay (HOSPITAL_COMMUNITY)
Admission: RE | Admit: 2017-03-17 | Discharge: 2017-03-19 | DRG: 621 | Disposition: A | Discharge status: Home / Self Care | Payer: 59 | Source: Ambulatory Visit | Attending: Surgery | Admitting: Surgery

## 2017-03-17 ENCOUNTER — Other Ambulatory Visit: Payer: Self-pay

## 2017-03-17 DIAGNOSIS — Z9884 Bariatric surgery status: Secondary | ICD-10-CM

## 2017-03-17 DIAGNOSIS — E282 Polycystic ovarian syndrome: Secondary | ICD-10-CM | POA: Diagnosis present

## 2017-03-17 DIAGNOSIS — Z87891 Personal history of nicotine dependence: Secondary | ICD-10-CM

## 2017-03-17 DIAGNOSIS — Z6841 Body Mass Index (BMI) 40.0 and over, adult: Secondary | ICD-10-CM

## 2017-03-17 DIAGNOSIS — K219 Gastro-esophageal reflux disease without esophagitis: Secondary | ICD-10-CM | POA: Diagnosis present

## 2017-03-17 DIAGNOSIS — Z79899 Other long term (current) drug therapy: Secondary | ICD-10-CM

## 2017-03-17 DIAGNOSIS — G4733 Obstructive sleep apnea (adult) (pediatric): Secondary | ICD-10-CM | POA: Diagnosis not present

## 2017-03-17 HISTORY — PX: GASTRIC ROUX-EN-Y: SHX5262

## 2017-03-17 LAB — CBC
HCT: 35.7 % — ABNORMAL LOW (ref 36.0–46.0)
HEMOGLOBIN: 12 g/dL (ref 12.0–15.0)
MCH: 28.4 pg (ref 26.0–34.0)
MCHC: 33.6 g/dL (ref 30.0–36.0)
MCV: 84.4 fL (ref 78.0–100.0)
PLATELETS: 311 10*3/uL (ref 150–400)
RBC: 4.23 MIL/uL (ref 3.87–5.11)
RDW: 15.2 % (ref 11.5–15.5)
WBC: 19.6 10*3/uL — AB (ref 4.0–10.5)

## 2017-03-17 LAB — TYPE AND SCREEN
ABO/RH(D): B POS
ANTIBODY SCREEN: NEGATIVE

## 2017-03-17 LAB — CREATININE, SERUM
Creatinine, Ser: 0.81 mg/dL (ref 0.44–1.00)
GFR calc non Af Amer: >60 mL/min (ref >60)

## 2017-03-17 LAB — PREGNANCY, URINE: PREG TEST UR: NEGATIVE

## 2017-03-17 SURGERY — LAPAROSCOPIC ROUX-EN-Y GASTRIC BYPASS WITH UPPER ENDOSCOPY
Anesthesia: General | Site: Abdomen

## 2017-03-17 MED ORDER — SCOPOLAMINE 1 MG/3DAYS TD PT72
1.0000 | MEDICATED_PATCH | TRANSDERMAL | Status: DC
  Administered 2017-03-17: 1.5 mg via TRANSDERMAL
  Filled 2017-03-17: qty 1

## 2017-03-17 MED ORDER — KETAMINE HCL 10 MG/ML IJ SOLN
INTRAMUSCULAR | Status: AC
  Filled 2017-03-17: qty 1

## 2017-03-17 MED ORDER — TISSEEL VH 10 ML EX KIT
PACK | CUTANEOUS | Status: DC | PRN
  Administered 2017-03-17: 1

## 2017-03-17 MED ORDER — FENTANYL CITRATE (PF) 100 MCG/2ML IJ SOLN
INTRAMUSCULAR | Status: AC
  Filled 2017-03-17: qty 2

## 2017-03-17 MED ORDER — HEPARIN SODIUM (PORCINE) 5000 UNIT/ML IJ SOLN
5000.0000 [IU] | INTRAMUSCULAR | Status: AC
  Administered 2017-03-17: 5000 [IU] via SUBCUTANEOUS
  Filled 2017-03-17: qty 1

## 2017-03-17 MED ORDER — CHLORHEXIDINE GLUCONATE CLOTH 2 % EX PADS: 6.0000 | MEDICATED_PAD | Freq: Once | CUTANEOUS | Status: DC

## 2017-03-17 MED ORDER — MORPHINE SULFATE (PF) 4 MG/ML IV SOLN
1.0000 mg | INTRAVENOUS | Status: DC | PRN
  Administered 2017-03-17: 2 mg via INTRAVENOUS
  Administered 2017-03-17: 3 mg via INTRAVENOUS
  Administered 2017-03-17 – 2017-03-18 (×3): 2 mg via INTRAVENOUS
  Filled 2017-03-17 (×4): qty 1

## 2017-03-17 MED ORDER — LIDOCAINE HCL 2 % IJ SOLN
INTRAMUSCULAR | Status: AC
  Filled 2017-03-17: qty 20

## 2017-03-17 MED ORDER — PROPOFOL 10 MG/ML IV BOLUS
INTRAVENOUS | Status: DC | PRN
  Administered 2017-03-17: 300 mg via INTRAVENOUS

## 2017-03-17 MED ORDER — PROPOFOL 10 MG/ML IV BOLUS
INTRAVENOUS | Status: AC
  Filled 2017-03-17: qty 20

## 2017-03-17 MED ORDER — KCL IN DEXTROSE-NACL 20-5-0.45 MEQ/L-%-% IV SOLN
INTRAVENOUS | Status: DC
  Administered 2017-03-17 – 2017-03-19 (×6): via INTRAVENOUS
  Filled 2017-03-17 (×5): qty 1000

## 2017-03-17 MED ORDER — ACETAMINOPHEN 500 MG PO TABS
1000.0000 mg | ORAL_TABLET | ORAL | Status: AC
  Administered 2017-03-17: 1000 mg via ORAL
  Filled 2017-03-17: qty 2

## 2017-03-17 MED ORDER — OXYCODONE HCL 5 MG/5ML PO SOLN
5.0000 mg | ORAL | Status: DC | PRN
  Administered 2017-03-18 (×3): 10 mg via ORAL
  Administered 2017-03-18: 5 mg via ORAL
  Administered 2017-03-19 (×3): 10 mg via ORAL
  Filled 2017-03-17 (×3): qty 10
  Filled 2017-03-17: qty 5
  Filled 2017-03-17 (×3): qty 10

## 2017-03-17 MED ORDER — FENTANYL CITRATE (PF) 250 MCG/5ML IJ SOLN
INTRAMUSCULAR | Status: AC
  Filled 2017-03-17: qty 5

## 2017-03-17 MED ORDER — ONDANSETRON HCL 4 MG/2ML IJ SOLN: 4.0000 mg | INTRAMUSCULAR | Status: DC | PRN

## 2017-03-17 MED ORDER — ACETAMINOPHEN 160 MG/5ML PO SOLN
650.0000 mg | ORAL | Status: DC | PRN
  Administered 2017-03-18 – 2017-03-19 (×3): 650 mg via ORAL
  Filled 2017-03-17 (×3): qty 20.3

## 2017-03-17 MED ORDER — BUPIVACAINE LIPOSOME 1.3 % IJ SUSP
20.0000 mL | Freq: Once | INTRAMUSCULAR | Status: AC
Start: 2017-03-17 — End: 2017-03-17
  Administered 2017-03-17: 20 mL
  Filled 2017-03-17: qty 20

## 2017-03-17 MED ORDER — ONDANSETRON HCL 4 MG/2ML IJ SOLN
INTRAMUSCULAR | Status: DC | PRN
  Administered 2017-03-17: 4 mg via INTRAVENOUS

## 2017-03-17 MED ORDER — CELECOXIB 200 MG PO CAPS
400.0000 mg | ORAL_CAPSULE | ORAL | Status: AC
  Administered 2017-03-17: 400 mg via ORAL
  Filled 2017-03-17: qty 2

## 2017-03-17 MED ORDER — 0.9 % SODIUM CHLORIDE (POUR BTL) OPTIME
TOPICAL | Status: DC | PRN
  Administered 2017-03-17: 1000 mL

## 2017-03-17 MED ORDER — SODIUM CHLORIDE 0.9 % IJ SOLN
INTRAMUSCULAR | Status: DC | PRN
  Administered 2017-03-17: 10 mL

## 2017-03-17 MED ORDER — CEFOTETAN DISODIUM-DEXTROSE 2-2.08 GM-%(50ML) IV SOLR
2.0000 g | INTRAVENOUS | Status: AC
  Administered 2017-03-17: 2 g via INTRAVENOUS
  Filled 2017-03-17: qty 50

## 2017-03-17 MED ORDER — ROCURONIUM BROMIDE 10 MG/ML (PF) SYRINGE
PREFILLED_SYRINGE | INTRAVENOUS | Status: AC
  Filled 2017-03-17: qty 5

## 2017-03-17 MED ORDER — LABETALOL HCL 5 MG/ML IV SOLN
INTRAVENOUS | Status: DC | PRN
  Administered 2017-03-17 (×2): 2.5 mg via INTRAVENOUS

## 2017-03-17 MED ORDER — LABETALOL HCL 5 MG/ML IV SOLN
INTRAVENOUS | Status: AC
  Filled 2017-03-17: qty 4

## 2017-03-17 MED ORDER — GABAPENTIN 250 MG/5ML PO SOLN
200.0000 mg | Freq: Two times a day (BID) | ORAL | Status: DC
  Administered 2017-03-17 – 2017-03-19 (×5): 200 mg via ORAL
  Filled 2017-03-17 (×4): qty 5

## 2017-03-17 MED ORDER — LACTATED RINGERS IV SOLN
INTRAVENOUS | Status: DC | PRN
  Administered 2017-03-17 (×2): via INTRAVENOUS
  Administered 2017-03-17: 12:00:00

## 2017-03-17 MED ORDER — FENTANYL CITRATE (PF) 100 MCG/2ML IJ SOLN
25.0000 ug | INTRAMUSCULAR | Status: DC | PRN
  Administered 2017-03-17: 50 ug via INTRAVENOUS
  Administered 2017-03-17: 25 ug via INTRAVENOUS

## 2017-03-17 MED ORDER — FENTANYL CITRATE (PF) 250 MCG/5ML IJ SOLN
INTRAMUSCULAR | Status: DC | PRN
  Administered 2017-03-17 (×4): 50 ug via INTRAVENOUS
  Administered 2017-03-17: 100 ug via INTRAVENOUS
  Administered 2017-03-17 (×3): 50 ug via INTRAVENOUS

## 2017-03-17 MED ORDER — FENTANYL CITRATE (PF) 100 MCG/2ML IJ SOLN
INTRAMUSCULAR | Status: AC
  Filled 2017-03-17: qty 4

## 2017-03-17 MED ORDER — PREMIER PROTEIN SHAKE
2.0000 [oz_av] | ORAL | Status: DC
  Administered 2017-03-18 – 2017-03-19 (×15): 2 [oz_av] via ORAL

## 2017-03-17 MED ORDER — DEXAMETHASONE SODIUM PHOSPHATE 10 MG/ML IJ SOLN
INTRAMUSCULAR | Status: DC | PRN
  Administered 2017-03-17: 7 mg via INTRAVENOUS

## 2017-03-17 MED ORDER — MORPHINE SULFATE (PF) 4 MG/ML IV SOLN
INTRAVENOUS | Status: AC
  Filled 2017-03-17: qty 1

## 2017-03-17 MED ORDER — LACTATED RINGERS IR SOLN
Status: DC | PRN
  Administered 2017-03-17: 2000 mL

## 2017-03-17 MED ORDER — SUGAMMADEX SODIUM 500 MG/5ML IV SOLN
INTRAVENOUS | Status: AC
  Filled 2017-03-17: qty 5

## 2017-03-17 MED ORDER — ONDANSETRON HCL 4 MG/2ML IJ SOLN
INTRAMUSCULAR | Status: AC
  Filled 2017-03-17: qty 2

## 2017-03-17 MED ORDER — LIDOCAINE 2% (20 MG/ML) 5 ML SYRINGE
INTRAMUSCULAR | Status: AC
  Filled 2017-03-17: qty 5

## 2017-03-17 MED ORDER — ACETAMINOPHEN 10 MG/ML IV SOLN
INTRAVENOUS | Status: AC
  Filled 2017-03-17: qty 100

## 2017-03-17 MED ORDER — MIDAZOLAM HCL 5 MG/5ML IJ SOLN
INTRAMUSCULAR | Status: DC | PRN
  Administered 2017-03-17: 2 mg via INTRAVENOUS

## 2017-03-17 MED ORDER — ACETAMINOPHEN 10 MG/ML IV SOLN
1000.0000 mg | Freq: Once | INTRAVENOUS | Status: DC | PRN
  Administered 2017-03-17: 1000 mg via INTRAVENOUS

## 2017-03-17 MED ORDER — DEXAMETHASONE SODIUM PHOSPHATE 4 MG/ML IJ SOLN: 4.0000 mg | INTRAMUSCULAR | Status: DC

## 2017-03-17 MED ORDER — FENTANYL CITRATE (PF) 100 MCG/2ML IJ SOLN
25.0000 ug | INTRAMUSCULAR | Status: AC | PRN
  Administered 2017-03-17: 50 ug via INTRAVENOUS
  Administered 2017-03-17 (×6): 25 ug via INTRAVENOUS

## 2017-03-17 MED ORDER — ROCURONIUM BROMIDE 50 MG/5ML IV SOSY
PREFILLED_SYRINGE | INTRAVENOUS | Status: DC | PRN
  Administered 2017-03-17 (×2): 20 mg via INTRAVENOUS
  Administered 2017-03-17: 30 mg via INTRAVENOUS
  Administered 2017-03-17: 70 mg via INTRAVENOUS
  Administered 2017-03-17 (×3): 20 mg via INTRAVENOUS

## 2017-03-17 MED ORDER — SUCCINYLCHOLINE CHLORIDE 200 MG/10ML IV SOSY
PREFILLED_SYRINGE | INTRAVENOUS | Status: AC
  Filled 2017-03-17: qty 10

## 2017-03-17 MED ORDER — APREPITANT 40 MG PO CAPS
40.0000 mg | ORAL_CAPSULE | ORAL | Status: AC
  Administered 2017-03-17: 40 mg via ORAL
  Filled 2017-03-17: qty 1

## 2017-03-17 MED ORDER — PANTOPRAZOLE SODIUM 40 MG IV SOLR
40.0000 mg | Freq: Every day | INTRAVENOUS | Status: DC
  Administered 2017-03-17 – 2017-03-18 (×2): 40 mg via INTRAVENOUS
  Filled 2017-03-17 (×2): qty 40

## 2017-03-17 MED ORDER — GABAPENTIN 300 MG PO CAPS
300.0000 mg | ORAL_CAPSULE | ORAL | Status: AC
  Administered 2017-03-17: 300 mg via ORAL
  Filled 2017-03-17: qty 1

## 2017-03-17 MED ORDER — LIDOCAINE 2% (20 MG/ML) 5 ML SYRINGE
INTRAMUSCULAR | Status: DC | PRN
  Administered 2017-03-17: 100 mg via INTRAVENOUS

## 2017-03-17 MED ORDER — KETAMINE HCL 10 MG/ML IJ SOLN
INTRAMUSCULAR | Status: DC | PRN
  Administered 2017-03-17: 33 mg via INTRAVENOUS

## 2017-03-17 MED ORDER — LIDOCAINE 2% (20 MG/ML) 5 ML SYRINGE
INTRAMUSCULAR | Status: DC | PRN
  Administered 2017-03-17: 1.5 mg/kg/h via INTRAVENOUS

## 2017-03-17 MED ORDER — TISSEEL VH 10 ML EX KIT
PACK | CUTANEOUS | Status: AC
  Filled 2017-03-17: qty 1

## 2017-03-17 MED ORDER — SODIUM CHLORIDE 0.9 % IJ SOLN
INTRAMUSCULAR | Status: AC
  Filled 2017-03-17: qty 20

## 2017-03-17 MED ORDER — DEXAMETHASONE SODIUM PHOSPHATE 10 MG/ML IJ SOLN
INTRAMUSCULAR | Status: AC
  Filled 2017-03-17: qty 1

## 2017-03-17 MED ORDER — HEPARIN SODIUM (PORCINE) 5000 UNIT/ML IJ SOLN
5000.0000 [IU] | Freq: Three times a day (TID) | INTRAMUSCULAR | Status: DC
  Administered 2017-03-17 – 2017-03-19 (×6): 5000 [IU] via SUBCUTANEOUS
  Filled 2017-03-17 (×5): qty 1

## 2017-03-17 MED ORDER — MIDAZOLAM HCL 2 MG/2ML IJ SOLN
INTRAMUSCULAR | Status: AC
  Filled 2017-03-17: qty 2

## 2017-03-17 MED ORDER — SUGAMMADEX SODIUM 500 MG/5ML IV SOLN
INTRAVENOUS | Status: DC | PRN
  Administered 2017-03-17: 300 mg via INTRAVENOUS

## 2017-03-17 SURGICAL SUPPLY — 65 items
APPLICATOR COTTON TIP 6IN STRL (MISCELLANEOUS) ×4 IMPLANT
APPLIER CLIP ROT 10 11.4 M/L (STAPLE)
APPLIER CLIP ROT 13.4 12 LRG (CLIP)
BENZOIN TINCTURE PRP APPL 2/3 (GAUZE/BANDAGES/DRESSINGS) IMPLANT
BLADE SURG 15 STRL LF DISP TIS (BLADE) ×1 IMPLANT
BLADE SURG 15 STRL SS (BLADE) ×1
CABLE HIGH FREQUENCY MONO STRZ (ELECTRODE) IMPLANT
CLIP APPLIE ROT 10 11.4 M/L (STAPLE) IMPLANT
CLIP APPLIE ROT 13.4 12 LRG (CLIP) IMPLANT
CLIP SUT LAPRA TY ABSORB (SUTURE) ×2 IMPLANT
DERMABOND ADVANCED (GAUZE/BANDAGES/DRESSINGS) ×1
DERMABOND ADVANCED .7 DNX12 (GAUZE/BANDAGES/DRESSINGS) ×1 IMPLANT
DEVICE SUT QUICK LOAD TK 5 (STAPLE) ×4 IMPLANT
DEVICE SUT TI-KNOT TK 5X26 (MISCELLANEOUS) ×2 IMPLANT
DEVICE SUTURE ENDOST 10MM (ENDOMECHANICALS) ×2 IMPLANT
DISSECTOR BLUNT TIP ENDO 5MM (MISCELLANEOUS) ×2 IMPLANT
DRAIN PENROSE 18X1/4 LTX STRL (WOUND CARE) ×2 IMPLANT
GAUZE SPONGE 4X4 12PLY STRL (GAUZE/BANDAGES/DRESSINGS) IMPLANT
GAUZE SPONGE 4X4 16PLY XRAY LF (GAUZE/BANDAGES/DRESSINGS) ×2 IMPLANT
GLOVE BIOGEL M 8.0 STRL (GLOVE) ×2 IMPLANT
GOWN STRL REUS W/TWL XL LVL3 (GOWN DISPOSABLE) ×8 IMPLANT
HANDLE STAPLE EGIA 4 XL (STAPLE) ×2 IMPLANT
HOVERMATT SINGLE USE (MISCELLANEOUS) ×2 IMPLANT
KIT BASIN OR (CUSTOM PROCEDURE TRAY) ×2 IMPLANT
KIT GASTRIC LAVAGE 34FR ADT (SET/KITS/TRAYS/PACK) ×2 IMPLANT
MARKER SKIN DUAL TIP RULER LAB (MISCELLANEOUS) ×2 IMPLANT
NEEDLE SPNL 22GX3.5 QUINCKE BK (NEEDLE) ×2 IMPLANT
PACK CARDIOVASCULAR III (CUSTOM PROCEDURE TRAY) ×2 IMPLANT
RELOAD EGIA 45 MED/THCK PURPLE (STAPLE) IMPLANT
RELOAD EGIA 45 TAN VASC (STAPLE) IMPLANT
RELOAD EGIA 60 MED/THCK PURPLE (STAPLE) ×4 IMPLANT
RELOAD EGIA 60 TAN VASC (STAPLE) IMPLANT
RELOAD ENDO STITCH 2.0 (ENDOMECHANICALS) ×9
RELOAD TRI 45 ART MED THCK PUR (STAPLE) IMPLANT
RELOAD TRI 60 ART MED THCK PUR (STAPLE) ×4 IMPLANT
SCISSORS LAP 5X45 EPIX DISP (ENDOMECHANICALS) ×2 IMPLANT
SEALANT SURGICAL APPL DUAL CAN (MISCELLANEOUS) ×2 IMPLANT
SET IRRIG TUBING LAPAROSCOPIC (IRRIGATION / IRRIGATOR) ×2 IMPLANT
SHEARS HARMONIC ACE PLUS 45CM (MISCELLANEOUS) ×2 IMPLANT
SLEEVE ADV FIXATION 12X100MM (TROCAR) ×4 IMPLANT
SLEEVE ADV FIXATION 5X100MM (TROCAR) IMPLANT
SOLUTION ANTI FOG 6CC (MISCELLANEOUS) ×2 IMPLANT
STAPLER VISISTAT 35W (STAPLE) ×2 IMPLANT
STRIP CLOSURE SKIN 1/2X4 (GAUZE/BANDAGES/DRESSINGS) IMPLANT
SUT RELOAD ENDO STITCH 2 48X1 (ENDOMECHANICALS) ×5
SUT RELOAD ENDO STITCH 2.0 (ENDOMECHANICALS) ×4
SUT SURGIDAC NAB ES-9 0 48 120 (SUTURE) ×2 IMPLANT
SUT VIC AB 2-0 SH 27 (SUTURE) ×1
SUT VIC AB 2-0 SH 27X BRD (SUTURE) ×1 IMPLANT
SUT VIC AB 4-0 SH 18 (SUTURE) ×2 IMPLANT
SUTURE RELOAD END STTCH 2 48X1 (ENDOMECHANICALS) ×5 IMPLANT
SUTURE RELOAD ENDO STITCH 2.0 (ENDOMECHANICALS) ×4 IMPLANT
SYR 10ML ECCENTRIC (SYRINGE) ×2 IMPLANT
SYR 20CC LL (SYRINGE) ×4 IMPLANT
SYR 50ML LL SCALE MARK (SYRINGE) ×2 IMPLANT
TOWEL OR 17X26 10 PK STRL BLUE (TOWEL DISPOSABLE) ×4 IMPLANT
TOWEL OR NON WOVEN STRL DISP B (DISPOSABLE) ×2 IMPLANT
TRAY FOLEY W/METER SILVER 16FR (SET/KITS/TRAYS/PACK) ×2 IMPLANT
TROCAR ADV FIXATION 12X100MM (TROCAR) ×2 IMPLANT
TROCAR ADV FIXATION 5X100MM (TROCAR) ×2 IMPLANT
TROCAR BLADELESS OPT 5 100 (ENDOMECHANICALS) ×2 IMPLANT
TROCAR XCEL 12X100 BLDLESS (ENDOMECHANICALS) ×2 IMPLANT
TUBING CONNECTING 10 (TUBING) ×4 IMPLANT
TUBING ENDO SMARTCAP PENTAX (MISCELLANEOUS) ×2 IMPLANT
TUBING INSUF HEATED (TUBING) ×2 IMPLANT

## 2017-03-17 NOTE — Anesthesia Postprocedure Evaluation (Signed)
Anesthesia Post Note  Patient: Crystal CritchleySherika M Diaz  Procedure(s) Performed: LAPAROSCOPIC ROUX-EN-Y GASTRIC BYPASS WITH HIATAL HERNIA REPAIR AND UPPER ENDOSCOPY (N/A Abdomen)     Patient location during evaluation: PACU Anesthesia Type: General Level of consciousness: awake and alert Pain management: pain level controlled Vital Signs Assessment: post-procedure vital signs reviewed and stable Respiratory status: spontaneous breathing, nonlabored ventilation, respiratory function stable and patient connected to nasal cannula oxygen Cardiovascular status: blood pressure returned to baseline and stable Postop Assessment: no apparent nausea or vomiting Anesthetic complications: no    Last Vitals:  Vitals:   03/17/17 1500 03/17/17 1515  BP: (!) 144/88 (!) 142/89  Pulse: (!) 102 94  Resp: 18 20  Temp: 37.1 C 36.7 C  SpO2: 100% 100%    Last Pain:  Vitals:   03/17/17 1515  TempSrc: Oral  PainSc:                  Jiles GarterJACKSON,[name] EDWARD

## 2017-03-17 NOTE — Interval H&P Note (Signed)
History and Physical Interval Note:  03/17/2017 7:02 AM  Crystal Diaz  has presented today for surgery, with the diagnosis of MORBID OBESITY  The various methods of treatment have been discussed with the patient and family. After consideration of risks, benefits and other options for treatment, the patient has consented to  Procedure(s): LAPAROSCOPIC ROUX-EN-Y GASTRIC BYPASS WITH UPPER ENDOSCOPY (N/A) as a surgical intervention .  The patient's history has been reviewed, patient examined, no change in status, stable for surgery.  I have reviewed the patient's chart and labs.  Questions were answered to the patient's satisfaction.     Valarie MerinoMatthew B Sussie Minor

## 2017-03-17 NOTE — Transfer of Care (Signed)
Immediate Anesthesia Transfer of Care Note  Patient: Crystal CritchleySherika M Diaz  Procedure(s) Performed: LAPAROSCOPIC ROUX-EN-Y GASTRIC BYPASS WITH HIATAL HERNIA REPAIR AND UPPER ENDOSCOPY (N/A Abdomen)  Patient Location: PACU  Anesthesia Type:General  Level of Consciousness: awake and alert   Airway & Oxygen Therapy: Patient Spontanous Breathing, Patient connected to face mask oxygen and c/o pain, whimpering.  Post-op Assessment: Report given to RN and Post -op Vital signs reviewed and stable  Post vital signs: Reviewed and stable  Last Vitals:  Vitals:   03/17/17 0609  BP: 132/82  Pulse: 100  Resp: 18  Temp: 36.6 C  SpO2: 100%    Last Pain:  Vitals:   03/17/17 0609  TempSrc: Oral  PainSc: 6       Patients Stated Pain Goal: 4 (03/17/17 16100609)  Complications: No apparent anesthesia complications

## 2017-03-17 NOTE — Anesthesia Procedure Notes (Signed)
Procedure Name: Intubation Date/Time: 03/17/2017 7:32 AM Performed by: Maxwell Caul, CRNA Pre-anesthesia Checklist: Patient identified, Emergency Drugs available, Suction available and Patient being monitored Patient Re-evaluated:Patient Re-evaluated prior to induction Oxygen Delivery Method: Circle system utilized Preoxygenation: Pre-oxygenation with 100% oxygen Induction Type: IV induction Ventilation: Mask ventilation without difficulty Laryngoscope Size: Mac and 4 Grade View: Grade I Tube type: Oral Tube size: 7.5 mm Number of attempts: 1 Airway Equipment and Method: Stylet and Oral airway Placement Confirmation: ETT inserted through vocal cords under direct vision,  positive ETCO2 and breath sounds checked- equal and bilateral Secured at: 21 cm Tube secured with: Tape Dental Injury: Teeth and Oropharynx as per pre-operative assessment

## 2017-03-17 NOTE — Progress Notes (Signed)
Patient anxious and panicky when BNC came into room.  Family at bedside.  Patient  states she could not take deep breath and was in pain.  We did some slow breaths together and cleared the room. Patient sats at 100% on 2 LNC. Anxiety decreased. Discussed breathing techniques with patient when feeling anxious.  Discussed post op day goals with patient including ambulation, IS, diet progression, pain, and nausea control.  Bedside RN made aware of patient pain. Questions answered.

## 2017-03-17 NOTE — Op Note (Signed)
Preoperative diagnosis: Roux-en-Y gastric bypass Postoperative diagnosis: Same   Procedure: Upper endoscopy   Surgeon: Berna Buehelsea A Delcia Spitzley, M.D.  Anesthesia: Gen.   Indications for procedure: This patient was undergoing a Roux-en-Y gastric bypass.   Description of procedure: The endoscopy was placed in the mouth and into the oropharynx and under endoscopic vision it was advanced to the esophagogastric junction. The pouch was insufflated and no bleeding or bubbles were seen. The GEJ was identified at 35cm from the teeth. The GJ anastomosis was identified at 40cm. It was patent and hemostatic. No bleeding or leaks were detected. The scope was withdrawn without difficulty.   Berna Buehelsea A Deandra Gadson, M.D. General, Bariatric, & Minimally Invasive Surgery [facility]

## 2017-03-17 NOTE — Op Note (Signed)
Surgeon: Pollyann SavoyMatt B. Daphine DeutscherMartin, MD, FACS Asst:  Phylliss Blakeshelsea Connor, MD Anesthesia: General endotracheal Drains: None  Procedure: Laparoscopic Roux en Y gastric bypass with 40 cm BP limb and 100 cm Roux limb, antecolic, antegastric, candy cane to the left.  Closure of Peterson's defect. Upper endoscopy.   Description of Procedure:  [facility] and given general anesthesia.  The abdomen was prepped with [employer] and draped sterilely.  A time out was performed.    The operation began by identifying the ligament of Treitz. I measured 40 cm downstream and divided the bowel with a 6 cm Covidian stapler.  I sutured a Penrose drain along the Roux limb end.  I measured a 1 meter (100 cm) Roux limb and then placed the distal bowels to the BP limb side by side and performed a stapled jejunojejunostomy. The common defect was closed from either end with 4-0 Vicryl using the Endo Stitch. The mesenteric defect was closed with a running 2-0 silk using the Endo Stitch. Tisseel was applied to the suture line.  The omentum was divided with the harmonic scalpel.  The Nathanson retractor was inserted in the left lateral segment of liver was retracted. The foregut dissection ensued.  Because of her GER Maricela CuretLaura Peters, CRNA passed the balloon tip calibration tube and with 10 cc of air this was positive.  A posterior dissection was performed and a single suture was placed with the Endostitch and secured with a Ty Knot.  The calibration tube test was repeated and was then negative.  The pouch was created with purple loads of the Covidien stapler (6 cm) the first with TRS* and then without and the rest with TRS.   The Roux limb was then brought up with the candycane pointed left and a back row of sutures of 2-0 Vicryl were placed. I opened along the right side of each structure and inserted the 4.5 cm stapler to create the gastrojejunostomy. The common defect was closed from either end with 2-0 Vicryl and a second  row was placed anterior to that the Ewald tube acting as a stent across the anastomosis. The Penrose drain was removed. Peterson's defect was closed with 2-0 silk.   Endoscopy was performed by Dr. Fredricka Bonineonnor and no bubbles were seen and the anastomosis was visulized.    The incisions were injected with Exparel and were closed with 4-0 Monocryl and Dermabond.    The patient was taken to the recovery room in satisfactory condition.  Matt B. Daphine DeutscherMartin, MD, FACS

## 2017-03-17 NOTE — Discharge Instructions (Signed)
° ° ° °GASTRIC BYPASS/SLEEVE ° Home Care Instructions ° ° These instructions are to help you care for yourself when you go home. ° °Call: If you have any problems. °• Call [phone] and ask for the surgeon on call °• If you need immediate help, come to the ER at [facility].  °• Tell the ER staff that you are a new post-op gastric bypass or gastric sleeve patient °  °Signs and symptoms to report: • Severe vomiting or nausea °o If you cannot keep down clear liquids for longer than 1 day, call your surgeon  °• Abdominal pain that does not get better after taking your pain medication °• Fever over 100.4° F with chills °• Heart beating over 100 beats a minute °• Shortness of breath at rest °• Chest pain °•  Redness, swelling, drainage, or foul odor at incision (surgical) sites °•  If your incisions open or pull apart °• Swelling or pain in calf (lower leg) °• Diarrhea (Loose bowel movements that happen often), frequent watery, uncontrolled bowel movements °• Constipation, (no bowel movements for 3 days) if this happens: Pick one °o Milk of Magnesia, 2 tablespoons by mouth, 3 times a day for 2 days if needed °o Stop taking Milk of Magnesia once you have a bowel movement °o Call your doctor if constipation continues °Or °o Miralax  (instead of Milk of Magnesia) following the label instructions °o Stop taking Miralax once you have a bowel movement °o Call your doctor if constipation continues °• Anything you think is not normal °  °Normal side effects after surgery: • Unable to sleep at night or unable to focus °• Irritability or moody °• Being tearful (crying) or depressed °These are common complaints, possibly related to your anesthesia medications that put you to sleep, stress of surgery, and change in lifestyle.  This usually goes away a few weeks after surgery.  If these feelings continue, call your primary care doctor. °  °Wound Care: You may have surgical glue, steri-strips, or staples over your incisions after  surgery °• Surgical glue:  Looks like a clear film over your incisions and will wear off a little at a time °• Steri-strips: Strips of tape over your incisions. You may notice a yellowish color on the skin under the steri-strips. This is used to make the   steri-strips stick better. Do not pull the steri-strips off - let them fall off °• Staples: Staples may be removed before you leave the hospital °o If you go home with staples, call [facility], [phone] at for an appointment with your surgeon’s nurse to have staples removed 10 days after surgery. °• Showering: You may shower two (2) days after your surgery unless your surgeon tells you differently °o Wash gently around incisions with warm soapy water, rinse well, and gently pat dry  °o No tub baths until staples are removed, steri-strips fall off or glue is gone.  °  °Medications: • Medications should be liquid or crushed if larger than the size of a dime °• Extended release pills (medication that release a little bit at a time through the day) should NOT be crushed or cut. (examples include XL, ER, DR, SR) °• Depending on the size and number of medications you take, you may need to space (take a few throughout the day)/change the time you take your medications so that you do not over-fill your pouch (smaller stomach) °• Make sure you follow-up with your primary care doctor to   make medication changes needed during rapid weight loss and life-style changes °• If you have diabetes, follow up with the doctor that orders your diabetes medication(s) within one week after surgery and check your blood sugar regularly. °• Do not drive while taking prescription pain medication  °• It is ok to take Tylenol by the bottle instructions with your pain medicine or instead of your pain medicine as needed.  DO NOT TAKE NSAIDS (EXAMPLES OF NSAIDS:  IBUPROFREN/ NAPROXEN)  °Diet:                    First 2 Weeks ° You will see the dietician t about two (2) weeks  after your surgery. The dietician will increase the types of foods you can eat if you are handling liquids well: °• If you have severe vomiting or nausea and cannot keep down clear liquids lasting longer than 1 day, call your surgeon @ ([phone]) °Protein Shake °• Drink at least 2 ounces of shake 5-6 times per day °• Each serving of protein shakes (usually 8 - 12 ounces) should have: °o 15 grams of protein  °o And no more than 5 grams of carbohydrate  °• Goal for protein each day: °o Men = 80 grams per day °o Women = 60 grams per day °• Protein powder may be added to fluids such as non-fat milk or Lactaid milk or unsweetened Soy/Almond milk (limit to 35 grams added protein powder per serving) ° °Hydration °• Slowly increase the amount of water and other clear liquids as tolerated (See Acceptable Fluids) °• Slowly increase the amount of protein shake as tolerated  °•  Sip fluids slowly and throughout the day.  Do not use straws. °• May use sugar substitutes in small amounts (no more than 6 - 8 packets per day; i.e. Splenda) ° °Fluid Goal °• The first goal is to drink at least 8 ounces of protein shake/drink per day (or as directed by the nutritionist); some examples of protein shakes are Syntrax Nectar, Adkins Advantage, EAS Edge HP, and Unjury. See handout from pre-op Bariatric Education Class: °o Slowly increase the amount of protein shake you drink as tolerated °o You may find it easier to slowly sip shakes throughout the day °o It is important to get your proteins in first °• Your fluid goal is to drink 64 - 100 ounces of fluid daily °o It may take a few weeks to build up to this °• 32 oz (or more) should be clear liquids  °And  °• 32 oz (or more) should be full liquids (see below for examples) °• Liquids should not contain sugar, caffeine, or carbonation ° °Clear Liquids: °• Water or Sugar-free flavored water (i.e. Fruit H2O, Propel) °• Decaffeinated coffee or tea (sugar-free) °• Crystal Lite, Wyler’s Lite,  Minute Maid Lite °• Sugar-free Jell-O °• Bouillon or broth °• Sugar-free Popsicle:   *Less than 20 calories each; Limit 1 per day ° °Full Liquids: °Protein Shakes/Drinks + 2 choices per day of other full liquids °• Full liquids must be: °o No More Than 15 grams of Carbs per serving  °o No More Than 3 grams of Fat per serving °• Strained low-fat cream soup (except Cream of Potato or Tomato) °• Non-Fat milk °• Fat-free Lactaid Milk °• Unsweetened Soy Or Unsweetened Almond Milk °• Low Sugar yogurt (Dannon Lite & Fit, Greek yogurt; Oikos Triple Zero; Chobani Simply 100; Yoplait 100 calorie Greek - No Fruit on the Bottom) ° °  °Vitamins   and Minerals • Start 1 day after surgery unless otherwise directed by your surgeon °• 2 Chewable Bariatric Specific Multivitamin / Multimineral Supplement with iron (Example: Bariatric Advantage Multi EA) °• Chewable Calcium with Vitamin D-3 °(Example: 3 Chewable Calcium Plus 600 with Vitamin D-3) °o Take 500 mg three (3) times a day for a total of 1500 mg each day °o Do not take all 3 doses of calcium at one time as it may cause constipation, and you can only absorb 500 mg  at a time  °o Do not mix multivitamins containing iron with calcium supplements; take 2 hours apart °• Menstruating women and those with a history of anemia (a blood disease that causes weakness) may need extra iron °o Talk with your doctor to see if you need more iron °• Do not stop taking or change any vitamins or minerals until you talk to your dietitian or surgeon °• Your Dietitian and/or surgeon must approve all vitamin and mineral supplements °  °Activity and Exercise: Limit your physical activity as instructed by your doctor.  It is important to continue walking at home.  During this time, use these guidelines: °• Do not lift anything greater than ten (10) pounds for at least two (2) weeks °• Do not go back to work or drive until your surgeon says you can °• You may have sex when you feel comfortable  °o It is  VERY important for female patients to use a reliable birth control method; fertility often increases after surgery  °o All hormonal birth control will be ineffective for 30 days after surgery due to medications given during surgery a barrier method must be used. °o Do not get pregnant for at least 18 months °• Start exercising as soon as your doctor tells you that you can °o Make sure your doctor approves any physical activity °• Start with a simple walking program °• Walk 5-15 minutes each day, 7 days per week.  °• Slowly increase until you are walking 30-45 minutes per day °Consider joining our BELT program. [phone] or email belt@[employer] °  °Special Instructions Things to remember: °• Use your CPAP when sleeping if this applies to you ° °• [facility] has two free Bariatric Surgery Support Groups that meet monthly °o The 3rd Thursday of each month, 6 pm, [facility]  °o The 2nd Friday of each month, 11:45 am in the private dining room in the basement of [facility] °• It is very important to keep all follow up appointments with your surgeon, dietitian, primary care physician, and behavioral health practitioner °• Routine follow up schedule with your surgeon include appointments at 2-3 weeks, 6-8 weeks, 6 months, and 1 year at a minimum.  Your surgeon may request to see you more often.   °o After the first year, please follow up with your bariatric surgeon and dietitian at least once a year in order to maintain best weight loss results °[facility]: [phone] °[facility]: [phone] °Bariatric Nurse Coordinator: [phone] °  °   Reviewed and Endorsed  °by [facility] Patient Education Committee, June, 2016 °Edits Approved: Aug, 2018 ° ° ° °

## 2017-03-18 LAB — CBC WITH DIFFERENTIAL/PLATELET
Basophils Absolute: 0 10*3/uL (ref 0.0–0.1)
Basophils Relative: 0 %
Eosinophils Absolute: 0 10*3/uL (ref 0.0–0.7)
Eosinophils Relative: 0 %
HEMATOCRIT: 34.2 % — AB (ref 36.0–46.0)
HEMOGLOBIN: 11.3 g/dL — AB (ref 12.0–15.0)
LYMPHS ABS: 3.4 10*3/uL (ref 0.7–4.0)
Lymphocytes Relative: 18 %
MCH: 28.3 pg (ref 26.0–34.0)
MCHC: 33 g/dL (ref 30.0–36.0)
MCV: 85.5 fL (ref 78.0–100.0)
Monocytes Absolute: 1.7 10*3/uL — ABNORMAL HIGH (ref 0.1–1.0)
Monocytes Relative: 9 %
NEUTROS ABS: 13.4 10*3/uL — AB (ref 1.7–7.7)
NEUTROS PCT: 73 %
Platelets: 308 10*3/uL (ref 150–400)
RBC: 4 MIL/uL (ref 3.87–5.11)
RDW: 15.3 % (ref 11.5–15.5)
WBC: 18.5 10*3/uL — ABNORMAL HIGH (ref 4.0–10.5)

## 2017-03-18 NOTE — Plan of Care (Signed)
Nutrition Education Note  Received consult for diet education per DROP protocol. Pt expressed interest in liquid forms of the multivitamin with iron.  Discussed 2 week post op diet with pt. Emphasized that liquids must be non carbonated, non caffeinated, and sugar free. Fluid goals discussed. Pt to follow up with outpatient bariatric RD for further diet progression after 2 weeks. Multivitamins and minerals also reviewed. Teach back method used, pt expressed understanding, expect good compliance.   Diet: First 2 Weeks  You will see the nutritionist about two (2) weeks after your surgery. The nutritionist will increase the types of foods you can eat if you are handling liquids well:  If you have severe vomiting or nausea and cannot handle clear liquids lasting longer than 1 day, call your surgeon  Protein Shake  Drink at least 2 ounces of shake 5-6 times per day  Each serving of protein shakes (usually 8 - 12 ounces) should have a minimum of:  15 grams of protein  And no more than 5 grams of carbohydrate  Goal for protein each day:  Men = 80 grams per day  Women = 60 grams per day  Protein powder may be added to fluids such as non-fat milk or Lactaid milk or Soy milk (limit to 35 grams added protein powder per serving)   Hydration  Slowly increase the amount of water and other clear liquids as tolerated (See Acceptable Fluids)  Slowly increase the amount of protein shake as tolerated  Sip fluids slowly and throughout the day  May use sugar substitutes in small amounts (no more than 6 - 8 packets per day; i.e. Splenda)   Fluid Goal  The first goal is to drink at least 8 ounces of protein shake/drink per day (or as directed by the nutritionist); some examples of protein shakes are Premier Protein, ITT IndustriesSyntrax Nectar, Dillard'sdkins Advantage, EAS Edge HP, and Unjury. See handout from pre-op Bariatric Education Class:  Slowly increase the amount of protein shake you drink as tolerated  You may find it  easier to slowly sip shakes throughout the day  It is important to get your proteins in first  Your fluid goal is to drink 64 - 100 ounces of fluid daily  It may take a few weeks to build up to this  32 oz (or more) should be clear liquids  And  32 oz (or more) should be full liquids (see below for examples)  Liquids should not contain sugar, caffeine, or carbonation   Clear Liquids:  Water or Sugar-free flavored water (i.e. Fruit H2O, Propel)  Decaffeinated coffee or tea (sugar-free)  Crystal Lite, Wyler?s Lite, Minute Maid Lite  Sugar-free Jell-O  Bouillon or broth  Sugar-free Popsicle: *Less than 20 calories each; Limit 1 per day   Full Liquids:  Protein Shakes/Drinks + 2 choices per day of other full liquids  Full liquids must be:  No More Than 12 grams of Carbs per serving  No More Than 3 grams of Fat per serving  Strained low-fat cream soup  Non-Fat milk  Fat-free Lactaid Milk  Sugar-free yogurt (Dannon Lite & Fit, AustriaGreek yogurt, Oikos Zero)   Crystal FrancoLindsey Deval Mroczka, MS, RD, LDN [facility] Clinical Dietitian Pager: [phone] After Hours Pager: [phone]

## 2017-03-18 NOTE — Progress Notes (Signed)
Patient alert and oriented, Post op day 1.  Provided support and encouragement.  Encouraged pulmonary toilet, ambulation and small sips of liquids.  All questions answered.  Will continue to monitor. 

## 2017-03-18 NOTE — Progress Notes (Signed)
Patient ID: Crystal Diaz, female   DOB: 01-04-1991, 27 y.o.   MRN: [id number] [facility] Progress Note:   1 Day Post-Op  Subjective: Mental status is clear.  Slept OK and had been up walking some Objective: Vital signs in last 24 hours: Temp:  [98 F (36.7 C)-99.1 F (37.3 C)] 98.4 F (36.9 C) (03/06 1316) Pulse Rate:  [77-102] 81 (03/06 1316) Resp:  [16-20] 16 (03/06 1316) BP: (123-144)/(68-89) 134/87 (03/06 1316) SpO2:  [96 %-100 %] 96 % (03/06 1316) Weight:  [139.5 kg (307 lb 8.7 oz)] 139.5 kg (307 lb 8.7 oz) (03/06 0453)  Intake/Output from previous day: 03/05 0701 - 03/06 0700 In: 4066.7 [P.O.:450; I.V.:3066.7; IV Piggyback:550] Out: 7408 [Urine:3150; Blood:25] Intake/Output this shift: No intake/output data recorded.  Physical Exam: Work of breathing is not labored.  Incisions covered with Dermabond  Lab Results:  Results for orders placed or performed during the hospital encounter of 03/17/17 (from the past 48 hour(s))  Pregnancy, urine STAT morning of surgery     Status: None   Collection Time: 03/17/17  5:50 AM  Result Value Ref Range   Preg Test, Ur NEGATIVE NEGATIVE    Comment:        THE SENSITIVITY OF THIS METHODOLOGY IS >20 mIU/mL. Performed at [facility], [place] [address], [place], [place] [address]   CBC     Status: Abnormal   Collection Time: 03/17/17  4:15 PM  Result Value Ref Range   WBC 19.6 (H) 4.0 - 10.5 K/uL   RBC 4.23 3.87 - 5.11 MIL/uL   Hemoglobin 12.0 12.0 - 15.0 g/dL   HCT 35.7 (L) 36.0 - 46.0 %   MCV 84.4 78.0 - 100.0 fL   MCH 28.4 26.0 - 34.0 pg   MCHC 33.6 30.0 - 36.0 g/dL   RDW 15.2 11.5 - 15.5 %   Platelets 311 150 - 400 K/uL    Comment: Performed at [facility], [place] [address] [place], [place] [address]  Creatinine, serum     Status: None   Collection Time: 03/17/17  4:15 PM  Result Value Ref Range   Creatinine, Ser 0.81 0.44 - 1.00 mg/dL   GFR calc non Af Amer >60 >60 mL/min    GFR calc Af Amer >60 >60 mL/min    Comment: (NOTE) The eGFR has been calculated using the CKD EPI equation. This calculation has not been validated in all clinical situations. eGFR's persistently <60 mL/min signify possible Chronic Kidney Disease. Performed at [facility], [place] [address], [place], [place] [address]   CBC WITH DIFFERENTIAL     Status: Abnormal   Collection Time: 03/18/17  5:10 AM  Result Value Ref Range   WBC 18.5 (H) 4.0 - 10.5 K/uL   RBC 4.00 3.87 - 5.11 MIL/uL   Hemoglobin 11.3 (L) 12.0 - 15.0 g/dL   HCT 34.2 (L) 36.0 - 46.0 %   MCV 85.5 78.0 - 100.0 fL   MCH 28.3 26.0 - 34.0 pg   MCHC 33.0 30.0 - 36.0 g/dL   RDW 15.3 11.5 - 15.5 %   Platelets 308 150 - 400 K/uL   Neutrophils Relative % 73 %   Neutro Abs 13.4 (H) 1.7 - 7.7 K/uL   Lymphocytes Relative 18 %   Lymphs Abs 3.4 0.7 - 4.0 K/uL   Monocytes Relative 9 %   Monocytes Absolute 1.7 (H) 0.1 - 1.0 K/uL   Eosinophils Relative 0 %   Eosinophils  Absolute 0.0 0.0 - 0.7 K/uL   Basophils Relative 0 %   Basophils Absolute 0.0 0.0 - 0.1 K/uL    Comment: Performed at [facility], [place] [address], [place], [place] [address]    Radiology/Results: No results found.  Anti-infectives: Anti-infectives (From admission, onward)   Start     Dose/Rate Route Frequency Ordered Stop   03/17/17 0549  cefoTEtan in Dextrose 5% (CEFOTAN) IVPB 2 g     2 g Intravenous On call to O.R. 03/17/17 0549 03/17/17 0808      Assessment/Plan: Problem List: Patient Active Problem List   Diagnosis Date Noted  . 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   1 Day Post-Op    LOS: 1 day   Matt B. Hassell Done, MD, [facility] [phone] beeper [phone]  03/18/2017 2:59 PM

## 2017-03-19 LAB — CBC WITH DIFFERENTIAL/PLATELET
BASOS ABS: 0 10*3/uL (ref 0.0–0.1)
BASOS PCT: 0 %
Eosinophils Absolute: 0.1 10*3/uL (ref 0.0–0.7)
Eosinophils Relative: 1 %
HEMATOCRIT: 34.9 % — AB (ref 36.0–46.0)
HEMOGLOBIN: 11.2 g/dL — AB (ref 12.0–15.0)
Lymphocytes Relative: 25 %
Lymphs Abs: 2.8 10*3/uL (ref 0.7–4.0)
MCH: 27.8 pg (ref 26.0–34.0)
MCHC: 32.1 g/dL (ref 30.0–36.0)
MCV: 86.6 fL (ref 78.0–100.0)
Monocytes Absolute: 1.3 10*3/uL — ABNORMAL HIGH (ref 0.1–1.0)
Monocytes Relative: 11 %
NEUTROS ABS: 7.3 10*3/uL (ref 1.7–7.7)
NEUTROS PCT: 63 %
Platelets: 301 10*3/uL (ref 150–400)
RBC: 4.03 MIL/uL (ref 3.87–5.11)
RDW: 15.4 % (ref 11.5–15.5)
WBC: 11.4 10*3/uL — ABNORMAL HIGH (ref 4.0–10.5)

## 2017-03-19 NOTE — Progress Notes (Signed)
Discharge instructions discussed with patient and family, verbalized agreement and understanding 

## 2017-03-19 NOTE — Progress Notes (Signed)
Nutrition Note  Followed-up with patient regarding question about liquid forms of the bariatric multivitamin. No liquid bariatric multivitamins are available that provide the recommended amounts of vitamin/minerals needed. Patient expressed understanding. Provided strategies to help take chewable MVI such as mixing with shake or yogurt.  Crystal FrancoLindsey Jemila Camille, MS, RD, LDN [facility] Clinical Dietitian Pager: [phone] After Hours Pager: [phone]

## 2017-03-19 NOTE — Consult Note (Signed)
   [facility]   03/19/2017  Alvin CritchleySherika M Faith 1990/03/20 [id number]   Spoke with Ms. Yetta BarreJones at bedside, prior to hospital discharge, on behalf of [facility] Care Management program for [place] employees/dependents with [facility] [employer] [facility].   Made Ms. Yetta BarreJones aware that she will receive post hospital discharge call.   Provided 24-hr nurse advice line magnet.   Appreciative of visit.   Raiford NobleAtika Lena Gores, MSN-Ed, RN,BSN [facility] Hospital Liaison [phone]

## 2017-03-19 NOTE — Discharge Summary (Signed)
Physician Discharge Summary  Patient ID: Alvin CritchleySherika M Wegman MRN: [id number] DOB/AGE: 11-Dec-1990 27 y.o.  PCP: Cassell ClementKnowles-Jonas, Lynde, MD  Admit date: 03/17/2017 Discharge date: 03/19/2017  Admission Diagnoses:  Morbid obesity   Discharge Diagnoses:  same  Principal Problem:   post lap roux en Y gastric bypass Active Problems:   S/P gastric bypass   Surgery:  Lap roux en Y gastric bypass  Discharged Condition: improved  Hospital Course:   Had surgery on Tuesday.  Begun on liquids and advanced until discharge .  Up and about.  REady for discharge on Thursday.    Consults: none  Significant Diagnostic Studies: none    Discharge Exam: Blood pressure (!) 121/95, pulse 89, temperature 98.4 F (36.9 C), temperature source Oral, resp. rate 18, height 5\' 9"  (1.753 m), weight (!) 140.3 kg (309 lb 3.2 oz), SpO2 94 %. Incisions OK  Disposition: 01-Home or Self Care  Discharge Instructions    Ambulate hourly while awake   Complete by:  As directed    Call MD for:  difficulty breathing, headache or visual disturbances   Complete by:  As directed    Call MD for:  persistant dizziness or light-headedness   Complete by:  As directed    Call MD for:  persistant nausea and vomiting   Complete by:  As directed    Call MD for:  redness, tenderness, or signs of infection (pain, swelling, redness, odor or green/yellow discharge around incision site)   Complete by:  As directed    Call MD for:  severe uncontrolled pain   Complete by:  As directed    Call MD for:  temperature >101 F   Complete by:  As directed    Diet bariatric full liquid   Complete by:  As directed    Incentive spirometry   Complete by:  As directed    Perform hourly while awake      Follow-up Information    [facility] Go on 04/01/2017.   Specialty:  General Surgery Why:  at 0930 with Dr Wenda LowMatt Gawain Crombie.  Please arrive 15 minutes early for your appointment Contact information: [address] [place] [address] [phone]        [facility] Follow up.   Specialty:  General Surgery Contact information: [address] [place] [address] [phone]           Signed: Valarie MerinoMatthew B Nelson Noone 03/19/2017, 1:20 PM

## 2017-03-19 NOTE — Progress Notes (Signed)
Patient alert and oriented, Post op day 2.  Provided support and encouragement.  Encouraged pulmonary toilet, ambulation and small sips of liquids. Tolerating protein shakes.  All questions answered.  Will continue to monitor.

## 2017-03-19 NOTE — Progress Notes (Signed)
Patient alert and oriented, pain is controlled. Patient is tolerating fluids, advanced to protein shake today, patient is tolerating well. Reviewed Gastric Bypass discharge instructions with patient and patient is able to articulate understanding. Provided information on BELT program, Support Group and [facility] outpatient pharmacy. All questions answered, will continue to monitor.    

## 2017-03-23 ENCOUNTER — Encounter: Payer: Self-pay | Admitting: *Deleted

## 2017-03-23 ENCOUNTER — Other Ambulatory Visit: Payer: Self-pay | Admitting: *Deleted

## 2017-03-23 ENCOUNTER — Telehealth (HOSPITAL_COMMUNITY): Payer: Self-pay

## 2017-03-23 NOTE — Telephone Encounter (Signed)
Patient called to discuss post bariatric surgery follow up questions.  See below:  1.  Tell me about your pain and pain management?taking pain medication regularly, called office for refill.  Encouraged patient to use tylenol.  Pain described as when drinking a cramping muscular pain,  Some c/o stiff neck encouraged to alternate heat/ice.  Medication does relieve discomfort  2.  Let's talk about fluid intake.  How much total fluid are you taking in?38 ounces of fluid.  Enouraged patient to try to get in water and protein before adding in cream soups etc to help with fluid volume  3.  How much protein have you taken in the last 2 days?60 grams of proten  4.  Have you had nausea?  Tell me about when have experienced nausea and what you did to help?denies nausea  5.  Has the frequency or color changed with your urine?urinating frequenly light in color  6.  Tell me what your incisions look like?look good  7.  Have you been passing gas? BM?had BM Saturday, discussed Miralax and MOM for constipation  8.  If a problem or question were to arise who would you call?  Do you know contact numbers for [facility], [facility], and [facility]?patient aware of how to call for all services  9.  How has the walking going?ambulating frequently out and about today  10.  How are your vitamins and calcium going?  How are you taking them?taking vtiamins twice per day and calcium 3x per day

## 2017-03-23 NOTE — Patient Outreach (Signed)
[facility]) Care Management  03/23/2017  Crystal CritchleySherika M Talavera Sep 11, 1990 [id number]   Subjective: Telephone call to patient's home / mobile number, spoke with patient, and HIPAA verified.  [facility]ed [facility]MR Transition of care follow up, patient voiced understanding, and is in agreement to follow up.  Patient is doing okay, having more pain on the left side than expected, and has spoken with the surgeons office.   Surgeon's office has increased pain medications, given recommendations regarding liquid diet, and pain management.   Patient advised to call back if recommendation did not decrease pain and is aware of signs / symptoms to report to MD.  Patient states she is able to manage self care and has assistance as needed with activities of daily living / home management.  Patient voices understanding of medical diagnosis, surgery, and treatment plan.  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  Patient has questions regarding FMLA, pay while on leave, advised to follow up with [facility], and [employer] regarding questions.   Patient voiced understanding and states she will follow up.   Patient states she does not have any education material, transition of care, care coordination, disease management, disease monitoring, transportation, community resource, or pharmacy needs at this time.   States she is very appreciative of the follow up and is in agreement to receive [facility] information.      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   Patient also has polycystic ovary issues.      Assessment: Received UMR Transition of care referral on 03/06/17.    Transition of care follow up completed, no care management needs, and will proceed with case closure.       Plan: RNCM will send patient successful outreach letter, [facility] pamph[facility], and magnet. RNCM will send case closure due to follow up completed / no care management needs request to Iverson AlaminLaura Greeson at [facility].       Shannon Kirkendall H. Gardiner Barefootooper RN, BSN, CCM [facility] Care Management [facility] Telephonic CM Phone: [phone] Fax: [phone]

## 2017-03-25 ENCOUNTER — Encounter: Payer: Self-pay | Admitting: Dietician

## 2017-03-26 ENCOUNTER — Other Ambulatory Visit: Payer: Self-pay | Admitting: *Deleted

## 2017-03-26 NOTE — Patient Outreach (Addendum)
[facility]) Care Management  03/26/2017  Crystal Diaz 11/01/1990 [id number]   Subjective: Telephone call to patient's home / mobile number, spoke with patient, and HIPAA verified.  Discussed [facility] Care Management UMR EMMI General Discharge follow up, patient voiced understanding, and is in agreement to follow up.  States she remembers speaking with this RNCM in the past. Patient states she is doing good, [facility], has some anxiety regarding her surgery and the liquid diet.   States she is planning to follow up with psychiatrist through the surgeon's office that was discussed when she completed her preoperative screening, and who stated they would refer her to a psychiatrist for short term anxiety management/ treatment if needed. States she is aware of the Employee Assistance Counseling Program (EACP) and declined to reach out to this resource at this time.   Patient denies harm to self or others.   States she will follow up with surgeon's office for additional assistance as needed for full liquid diet strategies and to access behavioral health resources through bariatric surgery program.  Patient states she does not have any education material, transition of care, care coordination, disease management, disease monitoring, transportation, community resource, or pharmacy needs at this time.   States she is very appreciative of the follow up and is in agreement to receive [facility] Care Management information.     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   Patient also has polycystic ovary issues.      Assessment: Received UMR Transition of care referral on 03/06/17.   Received EMMI General Discharge Red alert for patient answering yes to the following questions: Lost interest in things, and sad/  hopeless/anxious/ empty?  Transition of care follow up completed, EMMI follow up completed, no care management needs, and will proceed with case closure.       Plan: RNCM has sent patient successful outreach letter, [facility] pamphlet, and magnet. RNCM will send case closure due to follow up completed / no care management needs request to Iverson AlaminLaura Greeson at [facility] Care Management.       Crystal Lesh H. Gardiner Barefootooper RN, BSN, CCM [facility] Care Management [facility] Telephonic CM Phone: [phone] Fax: [phone]

## 2017-04-01 ENCOUNTER — Encounter: Payer: 59 | Attending: Surgery | Admitting: Dietician

## 2017-04-01 ENCOUNTER — Encounter: Payer: Self-pay | Admitting: Dietician

## 2017-04-01 VITALS — Wt 290.1 lb

## 2017-04-01 DIAGNOSIS — F17200 Nicotine dependence, unspecified, uncomplicated: Secondary | ICD-10-CM | POA: Insufficient documentation

## 2017-04-01 DIAGNOSIS — Z800 Family history of malignant neoplasm of digestive organs: Secondary | ICD-10-CM | POA: Diagnosis not present

## 2017-04-01 DIAGNOSIS — Z6841 Body Mass Index (BMI) 40.0 and over, adult: Secondary | ICD-10-CM | POA: Diagnosis not present

## 2017-04-01 DIAGNOSIS — Z713 Dietary counseling and surveillance: Secondary | ICD-10-CM | POA: Insufficient documentation

## 2017-04-01 DIAGNOSIS — E282 Polycystic ovarian syndrome: Secondary | ICD-10-CM | POA: Insufficient documentation

## 2017-04-01 DIAGNOSIS — Z9884 Bariatric surgery status: Secondary | ICD-10-CM

## 2017-04-01 NOTE — Progress Notes (Signed)
Follow-up visit:  2 Weeks Post-Operative Roux-en-Y Surgery  Medical Nutrition Therapy:  Appt start time: 1345 end time:  1430.  Primary concerns today: Post-operative Bariatric Surgery Nutrition Management.  Preferred Learning Style:  No preference indicated   Learning Readiness:   Change in progress  24-hr recall: B (AM): Premier Protein Shake Snks: Sugar-free popsicles, sugar-free yogurt, broths L (PM): Premier Protein Shake  Fluid intake: ~53oz Estimated total protein intake: 60-65g  Medications: Zofran, Protonix and Advil prn Supplementation: Bariatric Advantage MVI, usually BID. Tums Calcium 500mg  TID  Using straws: no Drinking while eating: no Hair loss: none Carbonated beverages: none N/V/D/C: tightness / pressure in upper chest and occasional nausea Dumping syndrome: none  Recent physical activity:  Light walking. She has purchased a [employer] membership and plans to start going 2-3x/wk with her friend.  Progress Towards Goal(s):  Some progress.  Patient has been having trouble swallowing her bariatric MVI. She has switched to Bariatric Advantage brand and continues to report some discomfort and nausea but continues to take them at least 2x/day. Has had no issues with Calcium supplements. She does not feel hungry but "wants to eat." She has been struggling with cooking for her 497 y/o son without being tempted to taste foods or lick the spoon. She is eager to start incorporating solid foods so that she can enjoy meal times with her son again. Plans to get a smart watch which will help her track her steps and remind her when to drink, as she sometimes struggles to meet daily fluid recommendations with the suggested sipping technique. She plans to start exercising this week now that she has had her follow up visit with her doctor.  Handouts given during visit include:  Diet Stages 3-7 Post-Op Progression Sheets  Quick tips for common complaints   Nutritional Diagnosis:   Inadequate fluid intake As related to recent bariatric surgery.  As evidenced by patient report of forgetting to drink fluids consistently throughout the day and finding it difficult to sip fluids slowly.    Intervention:  Nutrition education provided on dietary guidelines for post- bariatric surgery diet progression. Patient has entered stage III and is ready to start incorporating soft protein sources. She has had no c/o other than the occasional slight tightness to her chest.  Teaching Method Utilized: Visual Auditory  Barriers to learning/adherence to lifestyle change: none  Demonstrated degree of understanding via:  Teach Back   Monitoring/Evaluation:  Dietary intake, exercise, lap band fills, and body weight. Follow up in 2 months for 2-3 month post-op visit: May 15th.

## 2017-04-21 DIAGNOSIS — Z3049 Encounter for surveillance of other contraceptives: Secondary | ICD-10-CM | POA: Diagnosis not present

## 2017-05-15 ENCOUNTER — Telehealth: Payer: Self-pay | Admitting: Dietician

## 2017-05-15 ENCOUNTER — Encounter: Payer: Self-pay | Admitting: Dietician

## 2017-05-15 NOTE — Telephone Encounter (Signed)
Returned call to patient with concerns about multivitamins. She has been taking bariatric multivitamins as prescribed, but recently has begun having trouble tolerating them, triggering gag reflex when she tries to swallow after chewing. She will try to dissolve in 2-3oz liquid to take, and if this doesn't work, will take regular chewable multivitamins and supplement with B12, iron, and other vitamins as necessary. Encouraged patient to call back with any other questions or concerns.

## 2017-05-19 DIAGNOSIS — Z9884 Bariatric surgery status: Secondary | ICD-10-CM | POA: Diagnosis not present

## 2017-05-26 ENCOUNTER — Encounter (HOSPITAL_COMMUNITY): Payer: Self-pay

## 2017-05-27 ENCOUNTER — Ambulatory Visit: Payer: Self-pay | Admitting: Dietician

## 2017-05-27 ENCOUNTER — Telehealth: Payer: Self-pay | Admitting: Dietician

## 2017-05-27 ENCOUNTER — Other Ambulatory Visit: Payer: Self-pay | Admitting: Surgery

## 2017-05-27 ENCOUNTER — Ambulatory Visit (HOSPITAL_COMMUNITY)
Admission: RE | Admit: 2017-05-27 | Discharge: 2017-05-27 | Disposition: A | Discharge status: Home / Self Care | Payer: 59 | Source: Ambulatory Visit | Attending: Surgery | Admitting: Surgery

## 2017-05-27 DIAGNOSIS — E860 Dehydration: Secondary | ICD-10-CM | POA: Insufficient documentation

## 2017-05-27 MED ORDER — DEXTROSE 5 % IN LACTATED RINGERS IV BOLUS
1000.0000 mL | Freq: Once | INTRAVENOUS | Status: AC
  Administered 2017-05-27: 1000 mL via INTRAVENOUS

## 2017-05-27 MED ORDER — POTASSIUM CHLORIDE IN NACL 20-0.45 MEQ/L-% IV SOLN: INTRAVENOUS | Status: DC

## 2017-05-27 MED ORDER — SODIUM CHLORIDE 0.9 % IV SOLN: INTRAVENOUS | Status: DC

## 2017-05-27 MED ORDER — SODIUM CHLORIDE 0.9 % IV BOLUS: 1000.0000 mL | Freq: Once | INTRAVENOUS | Status: DC

## 2017-05-27 NOTE — Progress Notes (Signed)
PATIENT CARE CENTER NOTE  Diagnosis: Dehydration   Provider: Dr. Daphine Deutscher   Procedure: 1 liter fluid bolus   Note: Patient received bolus of 1 liter Dextrose 5% Lactated Ringers. Patient tolerated bolus well with no adverse reaction. Vitals taken and stable. Discharge information given to patient. Patient alert, oriented and ambulatory at discharge.

## 2017-05-27 NOTE — Discharge Instructions (Signed)
Today (05/27/17), you received a 1 liter bolus of fluids (D5 LR).    Dehydration, Adult Dehydration is when there is not enough fluid or water in your body. This happens when you lose more fluids than you take in. Dehydration can range from mild to very bad. It should be treated right away to keep it from getting very bad. Symptoms of mild dehydration may include:  Thirst.  Dry lips.  Slightly dry mouth.  Dry, warm skin.  Dizziness. Symptoms of moderate dehydration may include:  Very dry mouth.  Muscle cramps.  Dark pee (urine). Pee may be the color of tea.  Your body making less pee.  Your eyes making fewer tears.  Heartbeat that is uneven or faster than normal (palpitations).  Headache.  Light-headedness, especially when you stand up from sitting.  Fainting (syncope). Symptoms of very bad dehydration may include:  Changes in skin, such as: ? Cold and clammy skin. ? Blotchy (mottled) or pale skin. ? Skin that does not quickly return to normal after being lightly pinched and let go (poor skin turgor).  Changes in body fluids, such as: ? Feeling very thirsty. ? Your eyes making fewer tears. ? Not sweating when body temperature is high, such as in hot weather. ? Your body making very little pee.  Changes in vital signs, such as: ? Weak pulse. ? Pulse that is more than 100 beats a minute when you are sitting still. ? Fast breathing. ? Low blood pressure.  Other changes, such as: ? Sunken eyes. ? Cold hands and feet. ? Confusion. ? Lack of energy (lethargy). ? Trouble waking up from sleep. ? Short-term weight loss. ? Unconsciousness. Follow these instructions at home:  If told by your doctor, drink an ORS: ? Make an ORS by using instructions on the package. ? Start by drinking small amounts, about  cup (120 mL) every 5-10 minutes. ? Slowly drink more until you have had the amount that your doctor said to have.  Drink enough clear fluid to keep your  pee clear or pale yellow. If you were told to drink an ORS, finish the ORS first, then start slowly drinking clear fluids. Drink fluids such as: ? Water. Do not drink only water by itself. Doing that can make the salt (sodium) level in your body get too low (hyponatremia). ? Ice chips. ? Fruit juice that you have added water to (diluted). ? Low-calorie sports drinks.  Avoid: ? Alcohol. ? Drinks that have a lot of sugar. These include high-calorie sports drinks, fruit juice that does not have water added, and soda. ? Caffeine. ? Foods that are greasy or have a lot of fat or sugar.  Take over-the-counter and prescription medicines only as told by your doctor.  Do not take salt tablets. Doing that can make the salt level in your body get too high (hypernatremia).  Eat foods that have minerals (electrolytes). Examples include bananas, oranges, potatoes, tomatoes, and spinach.  Keep all follow-up visits as told by your doctor. This is important. Contact a doctor if:  You have belly (abdominal) pain that: ? Gets worse. ? Stays in one area (localizes).  You have a rash.  You have a stiff neck.  You get angry or annoyed more easily than normal (irritability).  You are more sleepy than normal.  You have a harder time waking up than normal.  You feel: ? Weak. ? Dizzy. ? Very thirsty.  You have peed (urinated) only a small amount of very  dark pee during 6-8 hours. Get help right away if:  You have symptoms of very bad dehydration.  You cannot drink fluids without throwing up (vomiting).  Your symptoms get worse with treatment.  You have a fever.  You have a very bad headache.  You are throwing up or having watery poop (diarrhea) and it: ? Gets worse. ? Does not go away.  You have blood or something green (bile) in your throw-up.  You have blood in your poop (stool). This may cause poop to look black and tarry.  You have not peed in 6-8 hours.  You pass out  (faint).  Your heart rate when you are sitting still is more than 100 beats a minute.  You have trouble breathing. This information is not intended to replace advice given to you by your health care provider. Make sure you discuss any questions you have with your health care provider. Document Released: 10/26/2008 Document Revised: 07/20/2015 Document Reviewed: 02/23/2015 Elsevier Interactive Patient Education  2018 [employer].

## 2017-05-27 NOTE — Telephone Encounter (Signed)
call was made regarding missed appointment today. left a message for her to call to reschedule if desired.

## 2017-06-03 ENCOUNTER — Encounter: Payer: 59 | Attending: Surgery | Admitting: Dietician

## 2017-06-03 ENCOUNTER — Encounter: Payer: Self-pay | Admitting: Dietician

## 2017-06-03 ENCOUNTER — Ambulatory Visit: Payer: Self-pay | Admitting: Dietician

## 2017-06-03 VITALS — Wt 263.8 lb

## 2017-06-03 DIAGNOSIS — Z713 Dietary counseling and surveillance: Secondary | ICD-10-CM | POA: Insufficient documentation

## 2017-06-03 DIAGNOSIS — Z6841 Body Mass Index (BMI) 40.0 and over, adult: Secondary | ICD-10-CM | POA: Diagnosis not present

## 2017-06-03 DIAGNOSIS — E282 Polycystic ovarian syndrome: Secondary | ICD-10-CM | POA: Insufficient documentation

## 2017-06-03 DIAGNOSIS — F17200 Nicotine dependence, unspecified, uncomplicated: Secondary | ICD-10-CM | POA: Diagnosis not present

## 2017-06-03 DIAGNOSIS — Z9884 Bariatric surgery status: Secondary | ICD-10-CM

## 2017-06-03 DIAGNOSIS — Z800 Family history of malignant neoplasm of digestive organs: Secondary | ICD-10-CM | POA: Diagnosis not present

## 2017-06-03 DIAGNOSIS — Z6838 Body mass index (BMI) 38.0-38.9, adult: Secondary | ICD-10-CM

## 2017-06-03 DIAGNOSIS — E6609 Other obesity due to excess calories: Secondary | ICD-10-CM

## 2017-06-03 NOTE — Progress Notes (Signed)
Follow-up visit:  8 Weeks Post-Operative Bariatric Roux-en-Y Surgery  Medical Nutrition Therapy:  Appt start time: 1300 end time:  1400.  Primary concerns today: Post-operative Bariatric Surgery Nutrition Management.  Preferred Learning Style:  No preference indicated  Learning Readiness:  Change in progress  24-hr recall: B (AM): eggs, bacon Snk (AM): nuts  L (PM): marinated chicken, green beans, broccoli, beef stew, protein shake occasionally Snk (PM): none  D (PM): similar to lunch Snk (PM): none  Fluid intake: 25-50oz Estimated total protein intake: 30-50g  Medications: Protonix prn, Zofran prn Supplementation: Calcium Carbonate, Bariatric MVI  CBW: 263.8# Starting wt at [facility]: 313.2#  Using straws: did not discuss Drinking while eating: yes, only a couple of sips Hair loss: none Carbonated beverages: none N/V/D/C: nausea, lower chest pain/pressure Dumping syndrome: none  Recent physical activity:  Did not discuss today  Progress Towards Goal(s):  In progress. Patient c/o lack of hunger and frequent pain/tightness which deters her from eating. She does not feel that she is eating or drinking enough and describes heightened anxiety around eating and thinking about eating. Fears throwing up. Received IV fluids (05/27/17) and reported feeling better x1 day. Currently she has been drinking cold water, 1/3 servings of Sunny-D and diet lemonade from [employer]. She has not had any protein drinks lately d/t them leaving a gritty feeling in her mouth, and she can no longer stomach the Premier Protein drinks but knows that she still needs to prioritize protein. She reports to be taking a few sips of fluid with meals as it helps her from gagging; denies that drinking at meal times makes her feel full. States bariatric MVI are all about the same size which causes a gag-reflex. Tried adding MVI to applesauce and doesn't care for the texture but was able to swallow without reflex. She  has joined a bariatric support group d/t feeling discouraged about her progress. She is interested in receiving counseling to address her anxiety.  Handouts given during visit include:  Post-Op Diet Stage 4 (current stage)  Bariatric Vitamins Recommendations/ Guidelines   Nutritional Diagnosis:  NI-5.11.1 Predicted suboptimal nutrient intake As related to bariatric surgery.  As evidenced by patient report of not taking vitamins as prescribed d/t inability to swallow them without gagging.    Intervention:  Nutrition education provided based on dietary guidelines for post-bariatric surgery diet progression. She is technically in stage 4 and is ready to incorporate non-starchy vegetables, however she is not currently meeting protein / fluid needs. She will try to incorporate more smoothies that include protein, vegetables and a small amount of fruit in order to consume more calories and fluids d/t never feeling hungry and frequent feelings of pain/pressure. She may try the bariatric patch vitamins after more research is done on their efficacy and safety.  Teaching Method Utilized: Visual Auditory  Barriers to learning/adherence to lifestyle change: fear of pain and emesis leading to fear of eating  Demonstrated degree of understanding via:  Teach Back   Monitoring/Evaluation:  Dietary intake, exercise, lap band fills, and body weight. Recommended to follow up in 4 months for 6 month post-op visit. Email and phone number provided to schedule this appointment.

## 2017-06-16 ENCOUNTER — Other Ambulatory Visit (HOSPITAL_COMMUNITY): Payer: Self-pay | Admitting: Surgery

## 2017-06-16 ENCOUNTER — Other Ambulatory Visit: Payer: Self-pay | Admitting: Surgery

## 2017-06-16 DIAGNOSIS — Z9884 Bariatric surgery status: Secondary | ICD-10-CM

## 2017-06-17 ENCOUNTER — Other Ambulatory Visit: Payer: Self-pay | Admitting: Surgery

## 2017-06-17 DIAGNOSIS — Z9884 Bariatric surgery status: Secondary | ICD-10-CM

## 2017-06-25 ENCOUNTER — Ambulatory Visit: Admission: RE | Admit: 2017-06-25 | Payer: 59 | Source: Ambulatory Visit

## 2017-07-28 DIAGNOSIS — H52223 Regular astigmatism, bilateral: Secondary | ICD-10-CM | POA: Diagnosis not present

## 2017-08-05 DIAGNOSIS — N921 Excessive and frequent menstruation with irregular cycle: Secondary | ICD-10-CM | POA: Diagnosis not present

## 2017-09-09 DIAGNOSIS — Z09 Encounter for follow-up examination after completed treatment for conditions other than malignant neoplasm: Secondary | ICD-10-CM | POA: Diagnosis not present

## 2017-11-05 DIAGNOSIS — Z3049 Encounter for surveillance of other contraceptives: Secondary | ICD-10-CM | POA: Diagnosis not present

## 2017-11-16 ENCOUNTER — Ambulatory Visit: Payer: 59 | Admitting: Primary Care

## 2017-11-16 DIAGNOSIS — Z0289 Encounter for other administrative examinations: Secondary | ICD-10-CM

## 2017-11-25 ENCOUNTER — Ambulatory Visit (INDEPENDENT_AMBULATORY_CARE_PROVIDER_SITE_OTHER): Payer: 59 | Admitting: Primary Care

## 2017-11-25 ENCOUNTER — Encounter: Payer: Self-pay | Admitting: Primary Care

## 2017-11-25 VITALS — BP 118/68 | HR 83 | Temp 98.2°F | Ht 69.0 in | Wt 218.8 lb

## 2017-11-25 DIAGNOSIS — Z9884 Bariatric surgery status: Secondary | ICD-10-CM

## 2017-11-25 DIAGNOSIS — F411 Generalized anxiety disorder: Secondary | ICD-10-CM | POA: Diagnosis not present

## 2017-11-25 NOTE — Assessment & Plan Note (Signed)
Following with bariatric surgery in the outpatient setting. Check labs today including CBC, vitamin B12, vitamin D, folate.  Commended her on her weight loss, encouraged her to continue.

## 2017-11-25 NOTE — Progress Notes (Signed)
Subjective:    Patient ID: Alvin CritchleySherika M Broers, female    DOB: April 10, 1990, 27 y.o.   MRN: [id number]  HPI  Ms. Yetta BarreJones is a 27 year old female who presents today to establish care and discuss the problems mentioned below. Will obtain old records.  1) Gastric Bypass: Status post lap roux en Y gastric bypass in March 2019. Weight just after bypass of 309 pounds.   She is currently following with bariatric surgery in the outpatient setting, Dr. Daphine DeutscherMartin, last visit was a few weeks ago. She is managed on pantoprazole 40 mg daily and is compliant.  She thought she was to undergo lab work, but no labs were ordered.  She has had no follow-up labs since surgery.  Wt Readings from Last 3 Encounters:  11/25/17 218 lb 12 oz (99.2 kg)  06/03/17 263 lb 12.8 oz (119.7 kg)  04/01/17 290 lb 1.6 oz (131.6 kg)   2) Anxiety: Symptoms of tearfulness, feeling anxious/nervous, daily worry, irritability, mild fatigue. Present since early 2019. She was once following with psychiatry prior to her surgery and was told that she had anxiety.   GAD 7 score of 17 and PHQ 9 score of 8 today.  She does not wish to treat anxiety with medication.  She has never seen a therapist.  Review of Systems  Constitutional: Positive for fatigue.  Eyes: Negative for visual disturbance.  Respiratory: Negative for shortness of breath.   Cardiovascular: Negative for chest pain.  Neurological: Negative for dizziness and headaches.  Psychiatric/Behavioral:       See HPI       Past Medical History:  Diagnosis Date  . Anemia   . Anxiety   . GERD (gastroesophageal reflux disease)   . Headache   . Lumbar radiculopathy   . PCOS (polycystic ovarian syndrome)   . Plantar fasciitis, bilateral      Social History   Socioeconomic History  . Marital status: Single    Spouse name: Not on file  . Number of children: Not on file  . Years of education: Not on file  . Highest education level: Not on file  Occupational History  . Not on  file  Social Needs  . Financial resource strain: Not on file  . Food insecurity:    Worry: Not on file    Inability: Not on file  . Transportation needs:    Medical: Not on file    Non-medical: Not on file  Tobacco Use  . Smoking status: Former Smoker    Packs/day: 0.25    Years: 4.00    Pack years: 1.00    Types: Cigarettes    Last attempt to quit: 01/19/2017    Years since quitting: 0.8  . Smokeless tobacco: Never Used  Substance and Sexual Activity  . Alcohol use: Yes    Alcohol/week: 0.0 standard drinks    Comment: occ  . Drug use: No  . Sexual activity: Not on file  Lifestyle  . Physical activity:    Days per week: Not on file    Minutes per session: Not on file  . Stress: Not on file  Relationships  . Social connections:    Talks on phone: Not on file    Gets together: Not on file    Attends religious service: Not on file    Active member of club or organization: Not on file    Attends meetings of clubs or organizations: Not on file    Relationship status: Not on  file  . Intimate partner violence:    Fear of current or ex partner: Not on file    Emotionally abused: Not on file    Physically abused: Not on file    Forced sexual activity: Not on file  Other Topics Concern  . Not on file  Social History Narrative   Single. In a relationship.    One son.   Works as Geographical information systems officer.    Enjoys spending time with her son, traveling.    Past Surgical History:  Procedure Laterality Date  . FOOT SURGERY Right   . 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   Procedure: LAPAROSCOPIC ROUX-EN-Y GASTRIC BYPASS WITH HIATAL HERNIA REPAIR AND UPPER ENDOSCOPY;  Surgeon: Luretha Murphy, MD;  Location: [facility];  Service: General;  Laterality: N/A;  . WISDOM TOOTH EXTRACTION      Family History  Problem Relation Age of Onset  . Diabetes Mother   . Pancreatic cancer Father 15  . Cancer Brother        Oral    No Known Allergies  Current Outpatient Medications on File Prior to  Visit  Medication Sig Dispense Refill  . calcium carbonate (TUMS - DOSED IN MG ELEMENTAL CALCIUM) 500 MG chewable tablet Chew 1 tablet by mouth 3 (three) times daily.    . MedroxyPROGESTERone Acetate 150 MG/ML SUSY Inject 150 mg into the skin every 3 (three) months.   3  . ondansetron (ZOFRAN-ODT) 4 MG disintegrating tablet   0  . pantoprazole (PROTONIX) 40 MG tablet   2   No current facility-administered medications on file prior to visit.     BP 118/68   Pulse 83   Temp 98.2 F (36.8 C) (Oral)   Ht 5\' 9"  (1.753 m)   Wt 218 lb 12 oz (99.2 kg)   SpO2 98%   BMI 32.30 kg/m    Objective:   Physical Exam  Constitutional: She appears well-nourished.  Neck: Neck supple.  Cardiovascular: Normal rate and regular rhythm.  Respiratory: Effort normal and breath sounds normal.  Skin: Skin is warm and dry.  Psychiatric: She has a normal mood and affect.           Assessment & Plan:

## 2017-11-25 NOTE — Patient Instructions (Addendum)
Stop by the lab prior to leaving today. I will notify you of your results once received.   You will be contacted regarding your referral to therapy.  Please let us know if you have not been contacted within one week.   It was a pleasure to meet you today! Please don't hesitate to call or message me with any questions. Welcome to [facility]!

## 2017-11-25 NOTE — Assessment & Plan Note (Signed)
Symptoms of anxiety since early 2019.  Some symptoms of depression.  GAD 7 score of 17 and PHQ 9 score of 8 today.  Discussed different options for treatment, she opts for therapy.  Referral placed to therapy for further evaluation and treatment.

## 2017-11-26 ENCOUNTER — Other Ambulatory Visit: Payer: Self-pay | Admitting: Primary Care

## 2017-11-26 DIAGNOSIS — E559 Vitamin D deficiency, unspecified: Secondary | ICD-10-CM

## 2017-11-26 DIAGNOSIS — E538 Deficiency of other specified B group vitamins: Secondary | ICD-10-CM

## 2017-11-26 LAB — VITAMIN D 25 HYDROXY (VIT D DEFICIENCY, FRACTURES): VITD: 11.6 ng/mL — ABNORMAL LOW (ref 30.00–100.00)

## 2017-11-26 LAB — IBC PANEL
Iron: 45 ug/dL (ref 42–145)
SATURATION RATIOS: 13.3 % — AB (ref 20.0–50.0)
TRANSFERRIN: 242 mg/dL (ref 212.0–360.0)

## 2017-11-26 LAB — CBC
HCT: 39.9 % (ref 36.0–46.0)
HEMOGLOBIN: 13.2 g/dL (ref 12.0–15.0)
MCHC: 33 g/dL (ref 30.0–36.0)
MCV: 88.4 fl (ref 78.0–100.0)
PLATELETS: 343 10*3/uL (ref 150.0–400.0)
RBC: 4.51 Mil/uL (ref 3.87–5.11)
RDW: 15.4 % (ref 11.5–15.5)
WBC: 10.9 10*3/uL — AB (ref 4.0–10.5)

## 2017-11-26 LAB — FOLATE: FOLATE: 5.7 ng/mL — AB (ref >5.9)

## 2017-11-26 LAB — VITAMIN B12: VITAMIN B 12: 136 pg/mL — AB (ref 211–911)

## 2017-11-26 MED ORDER — VITAMIN D (ERGOCALCIFEROL) 1.25 MG (50000 UNIT) PO CAPS: ORAL_CAPSULE | ORAL | 0 refills | Status: DC

## 2017-12-15 ENCOUNTER — Ambulatory Visit: Payer: 59 | Admitting: Psychology

## 2017-12-16 ENCOUNTER — Ambulatory Visit (INDEPENDENT_AMBULATORY_CARE_PROVIDER_SITE_OTHER): Payer: 59 | Admitting: *Deleted

## 2017-12-16 DIAGNOSIS — E538 Deficiency of other specified B group vitamins: Secondary | ICD-10-CM

## 2017-12-16 DIAGNOSIS — Z9884 Bariatric surgery status: Secondary | ICD-10-CM | POA: Diagnosis not present

## 2017-12-16 MED ORDER — CYANOCOBALAMIN 1000 MCG/ML IJ SOLN
1000.0000 ug | Freq: Once | INTRAMUSCULAR | Status: AC
Start: 2017-12-16 — End: 2017-12-16
  Administered 2017-12-16: 1000 ug via INTRAMUSCULAR

## 2017-12-16 NOTE — Progress Notes (Signed)
Per orders of Mayra ReelKate Clark, NP injection of Vitamin B12 ( left deltoid) given by Ileana LaddLoring, Donna Simpson. Patient tolerated injection well.

## 2018-01-14 ENCOUNTER — Ambulatory Visit (INDEPENDENT_AMBULATORY_CARE_PROVIDER_SITE_OTHER): Payer: No Typology Code available for payment source | Admitting: Psychology

## 2018-01-14 DIAGNOSIS — F411 Generalized anxiety disorder: Secondary | ICD-10-CM | POA: Diagnosis not present

## 2018-01-21 ENCOUNTER — Ambulatory Visit: Payer: No Typology Code available for payment source

## 2018-01-27 ENCOUNTER — Ambulatory Visit (INDEPENDENT_AMBULATORY_CARE_PROVIDER_SITE_OTHER): Payer: No Typology Code available for payment source | Admitting: Psychology

## 2018-01-27 DIAGNOSIS — F411 Generalized anxiety disorder: Secondary | ICD-10-CM

## 2018-02-04 ENCOUNTER — Ambulatory Visit (INDEPENDENT_AMBULATORY_CARE_PROVIDER_SITE_OTHER): Payer: No Typology Code available for payment source | Admitting: *Deleted

## 2018-02-04 DIAGNOSIS — E538 Deficiency of other specified B group vitamins: Secondary | ICD-10-CM | POA: Diagnosis not present

## 2018-02-04 MED ORDER — CYANOCOBALAMIN 1000 MCG/ML IJ SOLN
1000.0000 ug | Freq: Once | INTRAMUSCULAR | Status: AC
  Administered 2018-02-04: 1000 ug via INTRAMUSCULAR

## 2018-02-04 NOTE — Progress Notes (Signed)
Per orders of Dr. Para March (in absence of Mayra Reel), injection of  B12 1000 mcg/mL given by Anjalina Bergevin. Patient tolerated injection well.

## 2018-02-10 ENCOUNTER — Ambulatory Visit: Payer: No Typology Code available for payment source | Admitting: Psychology

## 2018-02-24 ENCOUNTER — Ambulatory Visit: Payer: Self-pay | Admitting: Psychology

## 2018-03-10 ENCOUNTER — Ambulatory Visit (INDEPENDENT_AMBULATORY_CARE_PROVIDER_SITE_OTHER): Payer: No Typology Code available for payment source

## 2018-03-10 DIAGNOSIS — E538 Deficiency of other specified B group vitamins: Secondary | ICD-10-CM | POA: Diagnosis not present

## 2018-03-10 MED ORDER — CYANOCOBALAMIN 1000 MCG/ML IJ SOLN
1000.0000 ug | Freq: Once | INTRAMUSCULAR | Status: AC
  Administered 2018-03-10: 1000 ug via INTRAMUSCULAR

## 2018-03-10 NOTE — Progress Notes (Signed)
Per orders of Vernona Rieger, NP injection of B12 given by Consuella Lose. Patient tolerated injection well. Patient is aware to come in for lab work on her next visit in March to recheck levels before next injection.

## 2018-03-18 ENCOUNTER — Ambulatory Visit (INDEPENDENT_AMBULATORY_CARE_PROVIDER_SITE_OTHER): Payer: No Typology Code available for payment source | Admitting: Primary Care

## 2018-03-18 ENCOUNTER — Encounter: Payer: Self-pay | Admitting: Primary Care

## 2018-03-18 VITALS — BP 124/80 | HR 106 | Temp 98.5°F | Ht 69.0 in | Wt 202.2 lb

## 2018-03-18 DIAGNOSIS — B9789 Other viral agents as the cause of diseases classified elsewhere: Secondary | ICD-10-CM

## 2018-03-18 DIAGNOSIS — J069 Acute upper respiratory infection, unspecified: Secondary | ICD-10-CM | POA: Diagnosis not present

## 2018-03-18 LAB — POC INFLUENZA A&B (BINAX/QUICKVUE)
INFLUENZA B, POC: NEGATIVE
Influenza A, POC: NEGATIVE

## 2018-03-18 MED ORDER — KETOROLAC TROMETHAMINE 60 MG/2ML IM SOLN
60.0000 mg | Freq: Once | INTRAMUSCULAR | Status: AC
  Administered 2018-03-18: 60 mg via INTRAMUSCULAR

## 2018-03-18 NOTE — Addendum Note (Signed)
Addended by: Tawnya Crook on: 03/18/2018 09:57 AM   Modules accepted: Orders

## 2018-03-18 NOTE — Progress Notes (Signed)
Subjective:    Patient ID: Crystal Diaz, female    DOB: 1990/09/25, 28 y.o.   MRN: [id number]  HPI  Crystal Diaz is a 28 year old female who presents today with a chief complaint of body aches.  She also reports sore throat, fever, fatigue. Symptoms began three days ago. Her fever was 102.1 three days ago, has not checked her temperature since then. Her son was febrile several days prior and has since improved. She's also feeling very anxious, doesn't feel as though she can take care of her son. She fell in her shower this morning as she felt weak.   She had her flu shot this year. 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 This made her upset. She's been taking Tylenol and Dayquil without improvement in body aches.   Wt Readings from Last 3 Encounters:  03/18/18 202 lb 4 oz (91.7 kg)  11/25/17 218 lb 12 oz (99.2 kg)  06/03/17 263 lb 12.8 oz (119.7 kg)     Review of Systems  Constitutional: Positive for chills, fatigue and fever.  HENT: Positive for congestion and rhinorrhea.   Respiratory: Positive for cough. Negative for shortness of breath.   Cardiovascular: Negative for chest pain.  Musculoskeletal: Positive for myalgias.       Past Medical History:  Diagnosis Date  . Anemia   . Anxiety   . GERD (gastroesophageal reflux disease)   . Headache   . Lumbar radiculopathy   . PCOS (polycystic ovarian syndrome)   . Plantar fasciitis, bilateral      Social History   Socioeconomic History  . Marital status: Single    Spouse name: Not on file  . Number of children: Not on file  . Years of education: Not on file  . Highest education level: Not on file  Occupational History  . Not on file  Social Needs  . Financial resource strain: Not on file  . Food insecurity:    Worry: Not on file    Inability: Not on file  . Transportation needs:    Medical: Not on file    Non-medical: Not on file  Tobacco Use  . Smoking  status: Former Smoker    Packs/day: 0.25    Years: 4.00    Pack years: 1.00    Types: Cigarettes    Last attempt to quit: 01/19/2017    Years since quitting: 1.1  . Smokeless tobacco: Never Used  Substance and Sexual Activity  . Alcohol use: Yes    Alcohol/week: 0.0 standard drinks    Comment: occ  . Drug use: No  . Sexual activity: Not on file  Lifestyle  . Physical activity:    Days per week: Not on file    Minutes per session: Not on file  . Stress: Not on file  Relationships  . Social connections:    Talks on phone: Not on file    Gets together: Not on file    Attends religious service: Not on file    Active member of club or organization: Not on file    Attends meetings of clubs or organizations: Not on file    Relationship status: Not on file  . Intimate partner violence:    Fear of current or ex partner: Not on file    Emotionally abused: Not on file    Physically abused: Not on file    Forced sexual activity: Not on file  Other  Topics Concern  . Not on file  Social History Narrative   Single. In a relationship.    One son.   Works as Geographical information systems officer.    Enjoys spending time with her son, traveling.    Past Surgical History:  Procedure Laterality Date  . FOOT SURGERY Right   . 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   Procedure: LAPAROSCOPIC ROUX-EN-Y GASTRIC BYPASS WITH HIATAL HERNIA REPAIR AND UPPER ENDOSCOPY;  Surgeon: Luretha Murphy, MD;  Location: [facility];  Service: General;  Laterality: N/A;  . WISDOM TOOTH EXTRACTION      Family History  Problem Relation Age of Onset  . Diabetes Mother   . Pancreatic cancer Father 47  . Cancer Brother        Oral    No Known Allergies  Current Outpatient Medications on File Prior to Visit  Medication Sig Dispense Refill  . calcium carbonate (TUMS - DOSED IN MG ELEMENTAL CALCIUM) 500 MG chewable tablet Chew 1 tablet by mouth 3 (three) times daily.    . MedroxyPROGESTERone Acetate 150 MG/ML SUSY Inject 150 mg into the  skin every 3 (three) months.   3  . pantoprazole (PROTONIX) 40 MG tablet   2  . Vitamin D, Ergocalciferol, (DRISDOL) 1.25 MG (50000 UT) CAPS capsule Take 1 capsule by mouth once weekly for 12 weeks. 12 capsule 0   No current facility-administered medications on file prior to visit.     BP 124/80   Pulse (!) 106   Temp 98.5 F (36.9 C) (Oral)   Ht 5\' 9"  (1.753 m)   Wt 202 lb 4 oz (91.7 kg)   SpO2 98%   BMI 29.87 kg/m    Objective:   Physical Exam  Constitutional: She appears well-nourished. She does not appear ill.  HENT:  Right Ear: Tympanic membrane and ear canal normal.  Left Ear: Tympanic membrane and ear canal normal.  Nose: Mucosal edema present. Right sinus exhibits no maxillary sinus tenderness and no frontal sinus tenderness. Left sinus exhibits no maxillary sinus tenderness and no frontal sinus tenderness.  Mouth/Throat: Oropharynx is clear and moist.  Neck: Neck supple.  Cardiovascular: Normal rate and regular rhythm.  Respiratory: Effort normal and breath sounds normal. She has no wheezes.  Skin: Skin is warm and dry.  Psychiatric:  Appears anxious            Assessment & Plan:  URI:  Cough, congestion, chills, body aches x 3 days. Appears anxious and is worried about her son. Exam today consistent for viral etiology.  Discussed Tylenol/Dayquil as needed. IM Toradol provided for body aches as she cannot take in oral NSAID's due to gastric bypass.  Fluids, rest, follow up PRN.  Doreene Nest, NP

## 2018-03-18 NOTE — Patient Instructions (Signed)
Your symptoms are representative of a viral illness which will resolve on its own over time. Our goal is to treat your symptoms in order to aid your body in the healing process and to make you more comfortable.   You can try a few things over the counter for your symptoms:  Cough: Delsym or Robitussin (get the off brand, works just as well) Chest Congestion: Mucinex (plain) Nasal Congestion/Ear Pressure/Sinus Pressure: Try using Flonase (fluticasone) nasal spray. Instill 1 spray in each nostril twice daily. This can be purchased over the counter. Tylenol: Body aches, fevers, headache.  Please schedule an appointment if you are not improving after one full week of symptoms, start running fevers consistently over 101, experience shortness of breath with or without wheezing.  It was a pleasure to see you today!

## 2018-03-23 ENCOUNTER — Ambulatory Visit (INDEPENDENT_AMBULATORY_CARE_PROVIDER_SITE_OTHER): Payer: No Typology Code available for payment source | Admitting: Primary Care

## 2018-03-23 ENCOUNTER — Encounter: Payer: Self-pay | Admitting: Primary Care

## 2018-03-23 VITALS — BP 116/72 | HR 73 | Temp 98.2°F | Ht 69.0 in | Wt 204.0 lb

## 2018-03-23 DIAGNOSIS — Z309 Encounter for contraceptive management, unspecified: Secondary | ICD-10-CM | POA: Insufficient documentation

## 2018-03-23 DIAGNOSIS — Z30013 Encounter for initial prescription of injectable contraceptive: Secondary | ICD-10-CM

## 2018-03-23 DIAGNOSIS — Z9884 Bariatric surgery status: Secondary | ICD-10-CM | POA: Diagnosis not present

## 2018-03-23 DIAGNOSIS — E538 Deficiency of other specified B group vitamins: Secondary | ICD-10-CM | POA: Diagnosis not present

## 2018-03-23 DIAGNOSIS — F411 Generalized anxiety disorder: Secondary | ICD-10-CM

## 2018-03-23 DIAGNOSIS — Z0000 Encounter for general adult medical examination without abnormal findings: Secondary | ICD-10-CM | POA: Insufficient documentation

## 2018-03-23 DIAGNOSIS — K219 Gastro-esophageal reflux disease without esophagitis: Secondary | ICD-10-CM | POA: Diagnosis not present

## 2018-03-23 DIAGNOSIS — Z0001 Encounter for general adult medical examination with abnormal findings: Secondary | ICD-10-CM | POA: Insufficient documentation

## 2018-03-23 HISTORY — DX: Encounter for contraceptive management, unspecified: Z30.9

## 2018-03-23 LAB — LIPID PANEL
Cholesterol: 100 mg/dL (ref 0–200)
HDL: 29.3 mg/dL — ABNORMAL LOW (ref >39.00)
LDL Cholesterol: 59 mg/dL (ref 0–99)
NONHDL: 70.69
Total CHOL/HDL Ratio: 3
Triglycerides: 58 mg/dL (ref 0.0–149.0)
VLDL: 11.6 mg/dL (ref 0.0–40.0)

## 2018-03-23 LAB — CBC
HCT: 39.9 % (ref 36.0–46.0)
Hemoglobin: 13.4 g/dL (ref 12.0–15.0)
MCHC: 33.5 g/dL (ref 30.0–36.0)
MCV: 87.3 fl (ref 78.0–100.0)
Platelets: 255 10*3/uL (ref 150.0–400.0)
RBC: 4.57 Mil/uL (ref 3.87–5.11)
RDW: 14 % (ref 11.5–15.5)
WBC: 5.4 10*3/uL (ref 4.0–10.5)

## 2018-03-23 LAB — COMPREHENSIVE METABOLIC PANEL
ALT: 13 U/L (ref 0–35)
AST: 15 U/L (ref 0–37)
Albumin: 4 g/dL (ref 3.5–5.2)
Alkaline Phosphatase: 45 U/L (ref 39–117)
BUN: 10 mg/dL (ref 6–23)
CO2: 26 mEq/L (ref 19–32)
Calcium: 9.1 mg/dL (ref 8.4–10.5)
Chloride: 108 mEq/L (ref 96–112)
Creatinine, Ser: 0.62 mg/dL (ref 0.40–1.20)
GFR: 136.66 mL/min (ref >60.00)
Glucose, Bld: 80 mg/dL (ref 70–99)
POTASSIUM: 4.1 meq/L (ref 3.5–5.1)
Sodium: 141 mEq/L (ref 135–145)
Total Bilirubin: 0.5 mg/dL (ref 0.2–1.2)
Total Protein: 6.6 g/dL (ref 6.0–8.3)

## 2018-03-23 LAB — POCT URINE PREGNANCY: Preg Test, Ur: NEGATIVE

## 2018-03-23 LAB — IBC PANEL
IRON: 126 ug/dL (ref 42–145)
Saturation Ratios: 42.5 % (ref 20.0–50.0)
TRANSFERRIN: 212 mg/dL (ref 212.0–360.0)

## 2018-03-23 LAB — TSH: TSH: 0.77 u[IU]/mL (ref 0.35–4.50)

## 2018-03-23 LAB — VITAMIN B12: Vitamin B-12: 611 pg/mL (ref 211–911)

## 2018-03-23 LAB — VITAMIN D 25 HYDROXY (VIT D DEFICIENCY, FRACTURES): VITD: 22.46 ng/mL — ABNORMAL LOW (ref 30.00–100.00)

## 2018-03-23 LAB — FOLATE: FOLATE: 9 ng/mL (ref >5.9)

## 2018-03-23 MED ORDER — SERTRALINE HCL 25 MG PO TABS: 25.0000 mg | ORAL_TABLET | Freq: Every day | ORAL | 1 refills | Status: DC

## 2018-03-23 NOTE — Progress Notes (Signed)
Subjective:    Patient ID: [name], female    DOB: 31-Jan-1990, 28 y.o.   MRN: [id number]  HPI  Ms. Zielsdorf is a 28 year old female who presents today for complete physical. She would also like to discuss ongoing anxiety and would like to get back on Depo Provera injections.   Immunizations: -Tetanus: Completed in 2013 -Influenza: Completed this season   Diet: She endorses a poor diet. Breakfast: Yogurt Lunch: Take out food, sometimes skips Dinner: Pasta, vegetables, chicken, casseroles Snacks: Chips, candy, nuts Desserts: Daily  Beverages: Semi-sweet tea, little water  Exercise: She is not exercising Eye exam: Completed in 2019 Dental exam: Completes semi-annually  Pap Smear: Completed in 2020  BP Readings from Last 3 Encounters:  03/23/18 116/72  03/18/18 124/80  11/25/17 118/68     Review of Systems  Constitutional: Negative for unexpected weight change.  HENT: Negative for rhinorrhea.   Respiratory: Negative for cough and shortness of breath.   Cardiovascular: Negative for chest pain.  Gastrointestinal: Negative for constipation and diarrhea.  Genitourinary: Positive for menstrual problem. Negative for difficulty urinating.       Wanting to get back on Depo Provera inections  Musculoskeletal: Positive for arthralgias.       Achy to lower back, hips for the last 4-5 months.   Skin: Negative for rash.  Allergic/Immunologic: Negative for environmental allergies.  Neurological: Negative for dizziness, numbness and headaches.  Psychiatric/Behavioral:       Continued anxiety symptoms, some improvement with therapy but still has daily worry, trouble sleeping, palpitations.        Past Medical History:  Diagnosis Date  . Anemia   . Anxiety   . GERD (gastroesophageal reflux disease)   . Headache   . Lumbar radiculopathy   . PCOS (polycystic ovarian syndrome)   . Plantar fasciitis, bilateral      Social History   Socioeconomic History  . Marital  status: Single    Spouse name: Not on file  . Number of children: Not on file  . Years of education: Not on file  . Highest education level: Not on file  Occupational History  . Not on file  Social Needs  . Financial resource strain: Not on file  . Food insecurity:    Worry: Not on file    Inability: Not on file  . Transportation needs:    Medical: Not on file    Non-medical: Not on file  Tobacco Use  . Smoking status: Former Smoker    Packs/day: 0.25    Years: 4.00    Pack years: 1.00    Types: Cigarettes    Last attempt to quit: 01/19/2017    Years since quitting: 1.1  . Smokeless tobacco: Never Used  Substance and Sexual Activity  . Alcohol use: Yes    Alcohol/week: 0.0 standard drinks    Comment: occ  . Drug use: No  . Sexual activity: Not on file  Lifestyle  . Physical activity:    Days per week: Not on file    Minutes per session: Not on file  . Stress: Not on file  Relationships  . Social connections:    Talks on phone: Not on file    Gets together: Not on file    Attends religious service: Not on file    Active member of club or organization: Not on file    Attends meetings of clubs or organizations: Not on file    Relationship status: Not on  file  . Intimate partner violence:    Fear of current or ex partner: Not on file    Emotionally abused: Not on file    Physically abused: Not on file    Forced sexual activity: Not on file  Other Topics Concern  . Not on file  Social History Narrative   Single. In a relationship.    One son.   Works as Geographical information systems officer.    Enjoys spending time with her son, traveling.    Past Surgical History:  Procedure Laterality Date  . FOOT SURGERY Right   . 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   Procedure: LAPAROSCOPIC ROUX-EN-Y GASTRIC BYPASS WITH HIATAL HERNIA REPAIR AND UPPER ENDOSCOPY;  Surgeon: Luretha Murphy, MD;  Location: [facility];  Service: General;  Laterality: N/A;  . WISDOM TOOTH EXTRACTION      Family History    Problem Relation Age of Onset  . Diabetes Mother   . Pancreatic cancer Father 41  . Cancer Brother        Oral    No Known Allergies  Current Outpatient Medications on File Prior to Visit  Medication Sig Dispense Refill  . calcium carbonate (TUMS - DOSED IN MG ELEMENTAL CALCIUM) 500 MG chewable tablet Chew 1 tablet by mouth 3 (three) times daily.    . MedroxyPROGESTERone Acetate 150 MG/ML SUSY Inject 150 mg into the skin every 3 (three) months.   3  . pantoprazole (PROTONIX) 40 MG tablet   2  . Vitamin D, Ergocalciferol, (DRISDOL) 1.25 MG (50000 UT) CAPS capsule Take 1 capsule by mouth once weekly for 12 weeks. 12 capsule 0   No current facility-administered medications on file prior to visit.     BP 116/72   Pulse 73   Temp 98.2 F (36.8 C) (Oral)   Ht 5\' 9"  (1.753 m)   Wt 204 lb (92.5 kg)   SpO2 99%   BMI 30.13 kg/m    Objective:   Physical Exam  Constitutional: She is oriented to person, place, and time. She appears well-nourished.  HENT:  Mouth/Throat: No oropharyngeal exudate.  Eyes: Pupils are equal, round, and reactive to light. EOM are normal.  Neck: Neck supple. No thyromegaly present.  Cardiovascular: Normal rate and regular rhythm.  Respiratory: Effort normal and breath sounds normal.  GI: Soft. Bowel sounds are normal. There is no abdominal tenderness.  Musculoskeletal: Normal range of motion.  Neurological: She is alert and oriented to person, place, and time.  Skin: Skin is warm and dry.  Psychiatric: She has a normal mood and affect.           Assessment & Plan:

## 2018-03-23 NOTE — Assessment & Plan Note (Signed)
Immunizations UTD. PAP smear UTD, follows with GYN. Recommended regular exercise, continue to work on diet. Exam unremarkable. Labs pending. Follow up in 1 year for CPE

## 2018-03-23 NOTE — Patient Instructions (Signed)
Start sertraline 25 mg (Zoloft) for anxiety. Start by taking 1/2 tablet daily for 6 days, then increase to 1 full tablet thereafter.  Stop by the lab prior to leaving today. I will notify you of your results once received.   Schedule a nurse visit for your Depo Provera injection.  Start exercising. You should be getting 150 minutes of moderate intensity exercise weekly.  It's important to improve your diet by reducing consumption of fast food, fried food, processed snack foods, sugary drinks. Increase consumption of fresh vegetables and fruits, whole grains, water.  Ensure you are drinking 64 ounces of water daily.  Follow up with your surgeon as discussed.  Schedule a follow up visit with me in 6 weeks for re-evaluation of anxiety.  It was a pleasure to see you today!   Preventive Care 18-39 Years, Female Preventive care refers to lifestyle choices and visits with your health care provider that can promote health and wellness. What does preventive care include?   A yearly physical exam. This is also called an annual well check.  Dental exams once or twice a year.  Routine eye exams. Ask your health care provider how often you should have your eyes checked.  Personal lifestyle choices, including: ? Daily care of your teeth and gums. ? Regular physical activity. ? Eating a healthy diet. ? Avoiding tobacco and drug use. ? Limiting alcohol use. ? Practicing safe sex. ? Taking vitamin and mineral supplements as recommended by your health care provider. What happens during an annual well check? The services and screenings done by your health care provider during your annual well check will depend on your age, overall health, lifestyle risk factors, and family history of disease. Counseling Your health care provider may ask you questions about your:  Alcohol use.  Tobacco use.  Drug use.  Emotional well-being.  Home and relationship well-being.  Sexual  activity.  Eating habits.  Work and work Statistician.  Method of birth control.  Menstrual cycle.  Pregnancy history. Screening You may have the following tests or measurements:  Height, weight, and BMI.  Diabetes screening. This is done by checking your blood sugar (glucose) after you have not eaten for a while (fasting).  Blood pressure.  Lipid and cholesterol levels. These may be checked every 5 years starting at age 42.  Skin check.  Hepatitis C blood test.  Hepatitis B blood test.  Sexually transmitted disease (STD) testing.  BRCA-related cancer screening. This may be done if you have a family history of breast, ovarian, tubal, or peritoneal cancers.  Pelvic exam and Pap test. This may be done every 3 years starting at age 22. Starting at age 26, this may be done every 5 years if you have a Pap test in combination with an HPV test. Discuss your test results, treatment options, and if necessary, the need for more tests with your health care provider. Vaccines Your health care provider may recommend certain vaccines, such as:  Influenza vaccine. This is recommended every year.  Tetanus, diphtheria, and acellular pertussis (Tdap, Td) vaccine. You may need a Td booster every 10 years.  Varicella vaccine. You may need this if you have not been vaccinated.  HPV vaccine. If you are 52 or younger, you may need three doses over 6 months.  Measles, mumps, and rubella (MMR) vaccine. You may need at least one dose of MMR. You may also need a second dose.  Pneumococcal 13-valent conjugate (PCV13) vaccine. You may need this if you  have certain conditions and were not previously vaccinated.  Pneumococcal polysaccharide (PPSV23) vaccine. You may need one or two doses if you smoke cigarettes or if you have certain conditions.  Meningococcal vaccine. One dose is recommended if you are age 46-21 years and a first-year college student living in a residence hall, or if you have one  of several medical conditions. You may also need additional booster doses.  Hepatitis A vaccine. You may need this if you have certain conditions or if you travel or work in places where you may be exposed to hepatitis A.  Hepatitis B vaccine. You may need this if you have certain conditions or if you travel or work in places where you may be exposed to hepatitis B.  Haemophilus influenzae type b (Hib) vaccine. You may need this if you have certain risk factors. Talk to your health care provider about which screenings and vaccines you need and how often you need them. This information is not intended to replace advice given to you by your health care provider. Make sure you discuss any questions you have with your health care provider. Document Released: 02/25/2001 Document Revised: 08/12/2016 Document Reviewed: 10/31/2014 Elsevier Interactive Patient Education  2019 [employer].

## 2018-03-23 NOTE — Assessment & Plan Note (Signed)
Doing well on pantoprazole daily. Continue same.

## 2018-03-23 NOTE — Assessment & Plan Note (Signed)
Compliant to B12 injections monthly. Repeat B12 lab pending.

## 2018-03-23 NOTE — Assessment & Plan Note (Addendum)
She will be seeing her gastric bypass surgeon next week. Also plans to meet with nutritionist.   Continue vitamin D 50,000 weekly. Continue monthly B12 injections.

## 2018-03-23 NOTE — Assessment & Plan Note (Signed)
Continued anxiety symptoms with therapy. Patient open to treatment with SSRI.  Rx for Zoloft 25 mg sent to pharmacy. Patient is to take 1/2 tablet daily for 6 days, then advance to 1 full tablet thereafter. We discussed possible side effects of headache, GI upset, drowsiness, and SI/HI. If thoughts of SI/HI develop, we discussed to present to the emergency immediately. Patient verbalized understanding.   Follow up in 6 weeks for re-evaluation.

## 2018-03-23 NOTE — Assessment & Plan Note (Addendum)
LMP 2 weeks ago, still spotting.  Last Depo Injection was in October 2019. Urine pregnancy negative today. We will resume Depo Provera injections. She has a prescription at her pharmacy now for Depo Provera and will bring in for a nurse visit for administration.

## 2018-04-12 ENCOUNTER — Telehealth: Payer: Self-pay | Admitting: Primary Care

## 2018-04-12 NOTE — Telephone Encounter (Signed)
Copied from CRM [id number]. Topic: Quick Communication - See Telephone Encounter >> Apr 12, 2018  4:31 PM Aretta Nip wrote: CRM for notification. See Telephone encounter for: 04/12/18. PT called and wants to resch this nurse visit on Thursday because she has to work. It needs however to be rescheduled for this week.  She also is wanting her DEPO ordered has requested but has not gotten a response.  Call came thru at 4:30 pm on PEC line. Please follow up.. Thanks

## 2018-04-13 ENCOUNTER — Other Ambulatory Visit (INDEPENDENT_AMBULATORY_CARE_PROVIDER_SITE_OTHER): Payer: No Typology Code available for payment source

## 2018-04-13 ENCOUNTER — Ambulatory Visit: Payer: No Typology Code available for payment source | Admitting: Psychology

## 2018-04-13 ENCOUNTER — Other Ambulatory Visit: Payer: Self-pay

## 2018-04-13 ENCOUNTER — Ambulatory Visit (INDEPENDENT_AMBULATORY_CARE_PROVIDER_SITE_OTHER): Payer: No Typology Code available for payment source

## 2018-04-13 DIAGNOSIS — E559 Vitamin D deficiency, unspecified: Secondary | ICD-10-CM

## 2018-04-13 DIAGNOSIS — Z30013 Encounter for initial prescription of injectable contraceptive: Secondary | ICD-10-CM

## 2018-04-13 DIAGNOSIS — E538 Deficiency of other specified B group vitamins: Secondary | ICD-10-CM

## 2018-04-13 LAB — VITAMIN B12: Vitamin B-12: 266 pg/mL (ref 211–911)

## 2018-04-13 LAB — VITAMIN D 25 HYDROXY (VIT D DEFICIENCY, FRACTURES): VITD: 26.34 ng/mL — ABNORMAL LOW (ref 30.00–100.00)

## 2018-04-13 MED ORDER — MEDROXYPROGESTERONE ACETATE 150 MG/ML IM SUSP
150.0000 mg | Freq: Once | INTRAMUSCULAR | Status: AC
  Administered 2018-04-13: 150 mg via INTRAMUSCULAR

## 2018-04-13 NOTE — Telephone Encounter (Signed)
Spoke to patient and was advised that she is due her Depo shot and wants to go ahead and get her B-12 level checked today while she is here. Patient stated that she has already had the 3 B-12 injections that she was to have.  Patient stated that she thinks that she has a depo injection at the pharmacy and will bring for her appointment. Appointment rescheduled for today at 3:30. Patient screened for visit. Patient has not travelled, no fever or respiratory symptoms.

## 2018-04-13 NOTE — Progress Notes (Signed)
Pt received Depo-Provera in Left Deltoid. Pt tolerated well. Next shot is due June 16-June 30. She did not bring her own medication.

## 2018-04-15 ENCOUNTER — Ambulatory Visit: Payer: Self-pay

## 2018-05-26 ENCOUNTER — Ambulatory Visit (INDEPENDENT_AMBULATORY_CARE_PROVIDER_SITE_OTHER): Payer: No Typology Code available for payment source

## 2018-05-26 DIAGNOSIS — E538 Deficiency of other specified B group vitamins: Secondary | ICD-10-CM | POA: Diagnosis not present

## 2018-05-26 MED ORDER — CYANOCOBALAMIN 1000 MCG/ML IJ SOLN
1000.0000 ug | Freq: Once | INTRAMUSCULAR | Status: AC
  Administered 2018-05-26: 1000 ug via INTRAMUSCULAR

## 2018-05-26 NOTE — Progress Notes (Signed)
Per orders of Vernona Rieger, NP, injection of Vitamin B12 given by Kizzie Ide, RN administered to R deltoid IM. Patient tolerated injection well.

## 2018-06-08 ENCOUNTER — Telehealth: Payer: Self-pay | Admitting: *Deleted

## 2018-06-08 NOTE — Telephone Encounter (Signed)
Patient left a voicemail stating that she is getting billed $150 for her injection and she can not afford this. Patient wants a call back as to what her options are?

## 2018-06-08 NOTE — Telephone Encounter (Signed)
Please notify patient that we can send the Depo Provera as a prescription if she has someone that is trained to inject the medication for her. If this isn't the case the I am happy to discuss other options virtually or in person. Let me know.

## 2018-06-09 NOTE — Telephone Encounter (Signed)
Spoken to patient and she stated that she was talking about her B12 injection not Depo. I did mention to her to call billing department. Not sure she going to take that advice. Should someone in our office help her with this? She does not want to continue the B12 injection if that is the case, she would not be able to afford it.

## 2018-06-09 NOTE — Telephone Encounter (Signed)
Please notify patient that we can stop the B12 injections. Have her transition to oral B12 1000 mcg and take this once daily, it may or may not absorb since her gastric bypass but it's worth trying. Has she tried oral B12 in the past?   If agreeable then set her up for labs in 3 months.

## 2018-06-10 ENCOUNTER — Ambulatory Visit (INDEPENDENT_AMBULATORY_CARE_PROVIDER_SITE_OTHER): Payer: No Typology Code available for payment source | Admitting: Primary Care

## 2018-06-10 DIAGNOSIS — F411 Generalized anxiety disorder: Secondary | ICD-10-CM | POA: Diagnosis not present

## 2018-06-10 MED ORDER — ESCITALOPRAM OXALATE 10 MG PO TABS: 10.0000 mg | ORAL_TABLET | Freq: Every day | ORAL | 1 refills | Status: DC

## 2018-06-10 NOTE — Telephone Encounter (Signed)
Spoke with patient regarding this, she will check with her insurance company. We may need to end up sending the B12 to her pharmacy as she does have someone trained who can administer.  She will update [place].

## 2018-06-10 NOTE — Patient Instructions (Signed)
Stop sertraline (Zoloft) 25 mg tablets for anxiety.  Start escitalopram (Lexapro) 10 mg tablets for anxiety.  Please update me in 1 month as discussed.  It was a pleasure to see you today! Mayra Reel, NP-C

## 2018-06-10 NOTE — Telephone Encounter (Signed)
Spoken and notified patient of Crystal Diaz comments. Patient stated that she cannot handle the pills well. She wants to continue the injections.

## 2018-06-10 NOTE — Progress Notes (Signed)
Subjective:    Patient ID: Crystal Diaz, female    DOB: [birth year], 28 y.o.   MRN: [id number]  HPI  Virtual Visit via Video Note  I connected with Crystal Diaz on 06/10/18 at 11:40 AM EDT by a video enabled telemedicine application and verified that I am speaking with the correct person using two identifiers.  Location: Patient: Home Provider: Office   I discussed the limitations of evaluation and management by telemedicine and the availability of in person appointments. The patient expressed understanding and agreed to proceed.  History of Present Illness:  Crystal Diaz is a 28 year old female with a history of anxiety, B12 deficiency, bariatric surgery who presents today with a chief complaint of anxiety.  She is currently managed on Zoloft 25 mg for which she started in early March 2020. Since then she's not noticed any improvement in symptoms, she thinks she's worse. Symptoms include irritability, feeling anxious, inability to hand stressful situations, decrease in patience, daily worry that has increased since Covid-19. She did have some nausea in the beginning but this quickly dissipated. Denies SI/HI.   Observations/Objective:  Alert and oriented. Appears well, not sickly. No distress. Speaking in complete sentences.   Assessment and Plan:  No improvement with Zoloft 25 mg, slightly worse now. Doubt that a dose increase will make much of a difference as she's not noticed any improvement and is worse. Switch to Lexapro 10 mg daily.  Recommended she update me in 1 month, or sooner if she has any problems. She verbalized understanding.   Follow Up Instructions:  Stop sertraline (Zoloft) 25 mg tablets for anxiety.  Start escitalopram (Lexapro) 10 mg tablets for anxiety.  Please update me in 1 month as discussed.  It was a pleasure to see you today! Mayra Reel, NP-C    I discussed the assessment and treatment plan with the patient. The patient was provided an  opportunity to ask questions and all were answered. The patient agreed with the plan and demonstrated an understanding of the instructions.   The patient was advised to call back or seek an in-person evaluation if the symptoms worsen or if the condition fails to improve as anticipated.    Doreene Nest, NP    Review of Systems  Respiratory: Negative for shortness of breath.   Cardiovascular: Negative for chest pain.  Gastrointestinal: Negative for nausea.  Neurological: Negative for headaches.  Psychiatric/Behavioral: Negative for suicidal ideas. The patient is nervous/anxious.        See HPI       Past Medical History:  Diagnosis Date  . Anemia   . Anxiety   . GERD (gastroesophageal reflux disease)   . Headache   . Lumbar radiculopathy   . PCOS (polycystic ovarian syndrome)   . Plantar fasciitis, bilateral      Social History   Socioeconomic History  . Marital status: Single    Spouse name: Not on file  . Number of children: Not on file  . Years of education: Not on file  . Highest education level: Not on file  Occupational History  . Not on file  Social Needs  . Financial resource strain: Not on file  . Food insecurity:    Worry: Not on file    Inability: Not on file  . Transportation needs:    Medical: Not on file    Non-medical: Not on file  Tobacco Use  . Smoking status: Former Smoker    Packs/day: 0.25  Years: 4.00    Pack years: 1.00    Types: Cigarettes    Last attempt to quit: 01/19/2017    Years since quitting: 1.3  . Smokeless tobacco: Never Used  Substance and Sexual Activity  . Alcohol use: Yes    Alcohol/week: 0.0 standard drinks    Comment: occ  . Drug use: No  . Sexual activity: Not on file  Lifestyle  . Physical activity:    Days per week: Not on file    Minutes per session: Not on file  . Stress: Not on file  Relationships  . Social connections:    Talks on phone: Not on file    Gets together: Not on file    Attends  religious service: Not on file    Active member of club or organization: Not on file    Attends meetings of clubs or organizations: Not on file    Relationship status: Not on file  . Intimate partner violence:    Fear of current or ex partner: Not on file    Emotionally abused: Not on file    Physically abused: Not on file    Forced sexual activity: Not on file  Other Topics Concern  . Not on file  Social History Narrative   Single. In a relationship.    One son.   Works as Geographical information systems officernursing secretary.    Enjoys spending time with her son, traveling.    Past Surgical History:  Procedure Laterality Date  . FOOT SURGERY Right   . 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 WISDOM TOOTH EXTRACTION      Family History  Problem Relation Age of Onset  . Diabetes Mother   . Pancreatic cancer Father [id number]  . Cancer Brother        Oral    No Known Allergies  Current Outpatient Medications on File Prior to Visit  Medication Sig Dispense Refill  . calcium carbonate (TUMS - DOSED IN MG ELEMENTAL CALCIUM) 500 MG chewable tablet Chew 1 tablet by mouth 3 (three) times daily.    . cyanocobalamin (,VITAMIN B-12,) 1000 MCG/ML injection Inject 1,000 mcg into the muscle every 30 (thirty) days.    . MedroxyPROGESTERone Acetate 150 MG/ML SUSY Inject 150 mg into the skin every 3 (three) months.   3  . pantoprazole (PROTONIX) 40 MG tablet   2  . Vitamin D, Ergocalciferol, (DRISDOL) 1.25 MG (50000 UT) CAPS capsule Take 1 capsule by mouth once weekly for 12 weeks. 12 capsule 0   No current facility-administered medications on file prior to visit.     There were no vitals taken for this visit.   Objective:   Physical Exam  Constitutional: She is oriented to person, place, and time. She appears well-nourished.  Respiratory: Effort  normal.  Neurological: She is alert and oriented to person, place, and time.  Psychiatric: She has a normal mood and affect.           Assessment & Plan:

## 2018-06-10 NOTE — Assessment & Plan Note (Signed)
No improvement with Zoloft 25 mg, slightly worse now. Doubt that a dose increase will make much of a difference as she's not noticed any improvement and is worse. Switch to Lexapro 10 mg daily.  Recommended she update me in 1 month, or sooner if she has any problems. She verbalized understanding.

## 2018-06-11 ENCOUNTER — Other Ambulatory Visit: Payer: Self-pay | Admitting: Primary Care

## 2018-06-11 DIAGNOSIS — E559 Vitamin D deficiency, unspecified: Secondary | ICD-10-CM

## 2018-06-11 NOTE — Telephone Encounter (Signed)
Noted, refill sent to pharmacy. 

## 2018-06-11 NOTE — Telephone Encounter (Signed)
Last prescribed on 11/26/2017. Last appointment on 06/10/2018. No future appointment

## 2018-08-25 ENCOUNTER — Other Ambulatory Visit: Payer: Self-pay

## 2018-08-25 ENCOUNTER — Ambulatory Visit (INDEPENDENT_AMBULATORY_CARE_PROVIDER_SITE_OTHER): Payer: No Typology Code available for payment source

## 2018-08-25 DIAGNOSIS — Z3042 Encounter for surveillance of injectable contraceptive: Secondary | ICD-10-CM

## 2018-08-25 LAB — POCT URINE PREGNANCY: Preg Test, Ur: NEGATIVE

## 2018-08-25 NOTE — Progress Notes (Signed)
Patient missed Depo shot injection in June. Pregnancy test performed today and it was negative. Patient is scheduled for 09/02/2018 to repeat pregnancy test again and is aware if the result is negative she can get Depo injection at that time.

## 2018-09-02 ENCOUNTER — Ambulatory Visit (INDEPENDENT_AMBULATORY_CARE_PROVIDER_SITE_OTHER): Payer: No Typology Code available for payment source

## 2018-09-02 DIAGNOSIS — E538 Deficiency of other specified B group vitamins: Secondary | ICD-10-CM | POA: Diagnosis not present

## 2018-09-02 DIAGNOSIS — Z3042 Encounter for surveillance of injectable contraceptive: Secondary | ICD-10-CM

## 2018-09-02 LAB — POCT URINE PREGNANCY: Preg Test, Ur: NEGATIVE

## 2018-09-02 MED ORDER — MEDROXYPROGESTERONE ACETATE 150 MG/ML IM SUSP
150.0000 mg | Freq: Once | INTRAMUSCULAR | Status: AC
  Administered 2018-09-02: 150 mg via INTRAMUSCULAR

## 2018-09-02 MED ORDER — CYANOCOBALAMIN 1000 MCG/ML IJ SOLN
1000.0000 ug | Freq: Once | INTRAMUSCULAR | Status: AC
  Administered 2018-09-02: 1000 ug via INTRAMUSCULAR

## 2018-09-02 NOTE — Progress Notes (Signed)
Per orders of Malena Edman, NP, injection of Vit B12 1064mcg given by Crystal Diaz. Patient tolerated injection well.  According to chart notes pt is to be getting monthly B12 injections   Written by Crystal Koch, NP on 04/14/2018 4:49 PM Hi Crystal Diaz,  Your recent lab tests have resulted and I've released them to My Chart. My comments are displayed below.   Your vitamin D level is improving which is good news!   Vitamin B12 is normal but no the lower end of normal. Continue with the B12 injections monthly.    Pt advised to keep up with monthly B12 injections. Pt states that she will have to call back to schedule for next injection for September.   Crystal Diaz, [facility] 09/02/18

## 2018-09-02 NOTE — Progress Notes (Signed)
Per orders of Alma Friendly, NP injection of medroxyprogesterone (Depo Provera) given by Diamond Nickel, RN.  Administered deep IM to Right ventrogluteal.  Administer Patient tolerated injection well.

## 2018-10-06 ENCOUNTER — Ambulatory Visit (INDEPENDENT_AMBULATORY_CARE_PROVIDER_SITE_OTHER): Payer: No Typology Code available for payment source

## 2018-10-06 DIAGNOSIS — E538 Deficiency of other specified B group vitamins: Secondary | ICD-10-CM

## 2018-10-06 MED ORDER — CYANOCOBALAMIN 1000 MCG/ML IJ SOLN
1000.0000 ug | Freq: Once | INTRAMUSCULAR | Status: AC
  Administered 2018-10-06: 1000 ug via INTRAMUSCULAR

## 2018-10-06 NOTE — Progress Notes (Signed)
Per orders of Katherine Clark, NP injection of B12 monthly given by Harjas Biggins V Brennah Quraishi. Patient tolerated injection well. 

## 2018-10-13 ENCOUNTER — Ambulatory Visit: Payer: No Typology Code available for payment source | Admitting: Primary Care

## 2018-10-18 ENCOUNTER — Ambulatory Visit: Payer: No Typology Code available for payment source | Admitting: Primary Care

## 2018-10-19 ENCOUNTER — Encounter: Payer: Self-pay | Admitting: Primary Care

## 2018-10-19 ENCOUNTER — Other Ambulatory Visit: Payer: Self-pay

## 2018-10-19 ENCOUNTER — Ambulatory Visit (INDEPENDENT_AMBULATORY_CARE_PROVIDER_SITE_OTHER): Payer: No Typology Code available for payment source | Admitting: Primary Care

## 2018-10-19 VITALS — BP 120/80 | HR 84 | Temp 97.8°F | Ht 69.0 in | Wt 199.5 lb

## 2018-10-19 DIAGNOSIS — Z113 Encounter for screening for infections with a predominantly sexual mode of transmission: Secondary | ICD-10-CM | POA: Insufficient documentation

## 2018-10-19 DIAGNOSIS — F411 Generalized anxiety disorder: Secondary | ICD-10-CM | POA: Diagnosis not present

## 2018-10-19 NOTE — Assessment & Plan Note (Signed)
No longer taking Lexapro as she didn't notice improvement. Overall doing well without medications. Discussed to notify me if she would like to reconsider treatment.  Consider Buspar vs SNRI.

## 2018-10-19 NOTE — Assessment & Plan Note (Signed)
Asymptomatic. Brother recently tested positive for HIV.  Labs pending.

## 2018-10-19 NOTE — Progress Notes (Signed)
Subjective:    Patient ID: Crystal Diaz, female    DOB: 1990/07/31, 28 y.o.   MRN: [id number]  HPI  Crystal Diaz is a 28 year old female who presents today for STD check and for follow up of anxiety.  1) Screening for STD's: She noticed a "bump" to her vaginal several months ago that has since disappeared. She has not been sexually active for six months without protection. She denies symptoms of vaginal discharge, vaginal itching, urinary frequency, dysuria. She recently found out that her brother tested positive for HIV.  2) GAD: Previously managed on Lexapro 10 mg which was initiated in late May 2020 for continued symptoms of anxiety including irritability, decreased patience, daily worry, etc despite Zoloft 25 mg which made her feel worse.   Since her last visit she's stopped taking Lexapro. She took Lexapro for about two months later as she didn't seem to notice much of a difference. She does notice intermittent symptoms of anxiety every several days, same symptoms as mentioned above.   Review of Systems  Gastrointestinal: Negative for abdominal pain.  Genitourinary: Negative for dysuria, frequency, pelvic pain and vaginal discharge.  Psychiatric/Behavioral:       See HPI       Past Medical History:  Diagnosis Date  . Anemia   . Anxiety   . GERD (gastroesophageal reflux disease)   . Headache   . Lumbar radiculopathy   . PCOS (polycystic ovarian syndrome)   . Plantar fasciitis, bilateral      Social History   Socioeconomic History  . Marital status: Single    Spouse name: Not on file  . Number of children: Not on file  . Years of education: Not on file  . Highest education level: Not on file  Occupational History  . Not on file  Social Needs  . Financial resource strain: Not on file  . Food insecurity    Worry: Not on file    Inability: Not on file  . Transportation needs    Medical: Not on file    Non-medical: Not on file  Tobacco Use  . Smoking status:  Former Smoker    Packs/day: 0.25    Years: 4.00    Pack years: 1.00    Types: Cigarettes    Quit date: 01/19/2017    Years since quitting: 1.7  . Smokeless tobacco: Never Used  Substance and Sexual Activity  . Alcohol use: Yes    Alcohol/week: 0.0 standard drinks    Comment: occ  . Drug use: No  . Sexual activity: Not on file  Lifestyle  . Physical activity    Days per week: Not on file    Minutes per session: Not on file  . Stress: Not on file  Relationships  . Social Musician on phone: Not on file    Gets together: Not on file    Attends religious service: Not on file    Active member of club or organization: Not on file    Attends meetings of clubs or organizations: Not on file    Relationship status: Not on file  . Intimate partner violence    Fear of current or ex partner: Not on file    Emotionally abused: Not on file    Physically abused: Not on file    Forced sexual activity: Not on file  Other Topics Concern  . Not on file  Social History Narrative   Single. In a relationship.  One son.   Works as Biomedical engineer.    Enjoys spending time with her son, traveling.    Past Surgical History:  Procedure Laterality Date  . FOOT SURGERY Right   . 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 WISDOM TOOTH EXTRACTION      Family History  Problem Relation Age of Onset  . Diabetes Mother   . Pancreatic cancer Father 54  . Cancer Brother        Oral    No Known Allergies  Current Outpatient Medications on File Prior to Visit  Medication Sig Dispense Refill  . calcium carbonate (TUMS - DOSED IN MG ELEMENTAL CALCIUM) 500 MG chewable tablet Chew 1 tablet by mouth 3 (three) times daily.    . cyanocobalamin (,VITAMIN B-12,) 1000 MCG/ML injection Inject 1,000 mcg into the muscle  every 30 (thirty) days.    . MedroxyPROGESTERone Acetate 150 MG/ML SUSY Inject 150 mg into the skin every 3 (three) months.   3  . pantoprazole (PROTONIX) 40 MG tablet Take 40 mg by mouth.   2  . Vitamin D, Ergocalciferol, (DRISDOL) 1.25 MG (50000 UT) CAPS capsule Take 1 capsule by mouth once weekly. (Patient not taking: Reported on 10/19/2018) 12 capsule 0   No current facility-administered medications on file prior to visit.     BP 120/80   Pulse 84   Temp 97.8 F (36.6 C) (Temporal)   Ht 5\' 9"  (1.753 m)   Wt 199 lb 8 oz (90.5 kg)   SpO2 98%   BMI 29.46 kg/m    Objective:   Physical Exam  Constitutional: She appears well-nourished.  Neck: Neck supple.  Cardiovascular: Normal rate and regular rhythm.  Respiratory: Effort normal and breath sounds normal.  Skin: Skin is warm and dry.  Psychiatric: She has a normal mood and affect.           Assessment & Plan:

## 2018-10-19 NOTE — Patient Instructions (Signed)
Stop by the lab prior to leaving today. I will notify you of your results once received.   It was a pleasure to see you today!  

## 2018-10-21 LAB — HSV 1/2 AB (IGM), IFA W/RFLX TITER
HSV 1 IgM Screen: NEGATIVE
HSV 2 IgM Screen: NEGATIVE

## 2018-10-21 LAB — TRICHOMONAS VAGINALIS, PROBE AMP: Trichomonas vaginalis RNA: NOT DETECTED

## 2018-10-21 LAB — HSV(HERPES SIMPLEX VRS) I + II AB-IGG
HAV 1 IGG,TYPE SPECIFIC AB: 30.8 index — ABNORMAL HIGH
HSV 2 IGG,TYPE SPECIFIC AB: 11 index — ABNORMAL HIGH

## 2018-10-21 LAB — RPR: RPR Ser Ql: NONREACTIVE

## 2018-10-21 LAB — HEPATITIS C ANTIBODY
Hepatitis C Ab: NONREACTIVE
SIGNAL TO CUT-OFF: 0.03 (ref <1.00)

## 2018-10-21 LAB — C. TRACHOMATIS/N. GONORRHOEAE RNA
C. trachomatis RNA, TMA: NOT DETECTED
N. gonorrhoeae RNA, TMA: NOT DETECTED

## 2018-10-21 LAB — HIV ANTIBODY (ROUTINE TESTING W REFLEX): HIV 1&2 Ab, 4th Generation: NONREACTIVE

## 2018-10-22 NOTE — Telephone Encounter (Signed)
Message left for patient to return my call.  

## 2018-10-22 NOTE — Telephone Encounter (Signed)
Best number [phone]  Pt would like a call back about her my chart message

## 2018-10-25 NOTE — Telephone Encounter (Signed)
Spoken to patient and try my best to explain patient about herpes. Patient had a few questions and concerns.

## 2018-10-25 NOTE — Telephone Encounter (Signed)
Best number [phone]  Pt returned your call

## 2018-10-25 NOTE — Telephone Encounter (Signed)
Called patient again due to she was not sure if she remembers all the questions that she wanted to ask this morning. No further questions per patient unless have outbreak

## 2018-10-27 ENCOUNTER — Encounter (HOSPITAL_COMMUNITY): Payer: Self-pay

## 2018-11-09 ENCOUNTER — Ambulatory Visit: Payer: No Typology Code available for payment source

## 2018-11-12 ENCOUNTER — Telehealth: Payer: Self-pay | Admitting: Primary Care

## 2018-11-12 NOTE — Telephone Encounter (Signed)
Please notify patient that it's fine for her to come in on November 11th as scheduled. I would like to recheck her B12 in December as scheduled.

## 2018-11-12 NOTE — Telephone Encounter (Signed)
Patient missed appointment for b12 on Tuesday. She asked if rescheduling would be best, next opening is nov 11th. She stated if she could pick up a b12 to give to herself. Or just reschedule. Patient also wanted to know if missing this will mess up her b12 schedule.

## 2018-11-15 NOTE — Telephone Encounter (Signed)
Please advise. Patient has her depo schedule on 11/24/2018 and B12 on 12/14/2018. Do you want her to keep as is and recheck the same day on 12/14/2018?

## 2018-11-15 NOTE — Telephone Encounter (Signed)
Patient called about b-12 shot.  Please call patient back at [phone].

## 2018-11-15 NOTE — Telephone Encounter (Signed)
Yes, lab draw prior to injection. Thanks.

## 2018-11-16 NOTE — Telephone Encounter (Signed)
Patient called her insurance to find out why she's being charged for her b-12 shot.  Insurance told her it's being coded wrong and she thinks they said it's being coded under medical.

## 2018-11-16 NOTE — Telephone Encounter (Signed)
Left patient a detail message regarding coding. Inform patient that the code we use is for vitamin B12 deficiency and it is medical. However, if patient would like to call insurance again and let us know what code is acceptable for her case then we will change it.

## 2018-11-16 NOTE — Telephone Encounter (Signed)
Spoken to patient and was able to schedule lab appt prior to B12 injection on 12/14/2018

## 2018-11-17 DIAGNOSIS — L600 Ingrowing nail: Secondary | ICD-10-CM

## 2018-11-18 ENCOUNTER — Ambulatory Visit: Payer: Commercial Managed Care - PPO | Admitting: Podiatry

## 2018-11-22 ENCOUNTER — Telehealth: Payer: Self-pay | Admitting: Primary Care

## 2018-11-22 NOTE — Telephone Encounter (Signed)
Completed and handed to Chan. 

## 2018-11-22 NOTE — Telephone Encounter (Signed)
Patient called.  Patient spoke to her benefits. They told her [employer]a [employer] needs to fill out an appeal form.  The insurance doesn't know why patient's on b-12 shot. The insurance won't pay for the b-12 shots. Patient will fax appeal form.

## 2018-11-22 NOTE — Telephone Encounter (Signed)
I have completed most of the form and place in [employer] for more input.

## 2018-11-22 NOTE — Telephone Encounter (Signed)
Noted  

## 2018-11-23 NOTE — Telephone Encounter (Signed)
Spoken to patient. I have given the okay from Anda Kraft to give patient's B12 and Depo on Thursday. Patient have been notified the need of lab before injection.

## 2018-11-23 NOTE — Telephone Encounter (Signed)
Faxed as requested

## 2018-11-23 NOTE — Telephone Encounter (Signed)
Called patient to confirm her nurse visit Thursday 11/25/18. Patient stated that she wanted to make sure that she is to get her B-12 injection also that day. Patient requested that this message go back to [place] to confirm the B-12 injection also.

## 2018-11-24 ENCOUNTER — Ambulatory Visit: Payer: No Typology Code available for payment source

## 2018-11-25 ENCOUNTER — Other Ambulatory Visit (INDEPENDENT_AMBULATORY_CARE_PROVIDER_SITE_OTHER): Payer: No Typology Code available for payment source

## 2018-11-25 ENCOUNTER — Ambulatory Visit (INDEPENDENT_AMBULATORY_CARE_PROVIDER_SITE_OTHER): Payer: No Typology Code available for payment source | Admitting: *Deleted

## 2018-11-25 ENCOUNTER — Other Ambulatory Visit: Payer: Self-pay

## 2018-11-25 DIAGNOSIS — K909 Intestinal malabsorption, unspecified: Secondary | ICD-10-CM | POA: Diagnosis not present

## 2018-11-25 DIAGNOSIS — Z3042 Encounter for surveillance of injectable contraceptive: Secondary | ICD-10-CM | POA: Diagnosis not present

## 2018-11-25 DIAGNOSIS — E538 Deficiency of other specified B group vitamins: Secondary | ICD-10-CM

## 2018-11-25 MED ORDER — CYANOCOBALAMIN 1000 MCG/ML IJ SOLN
1000.0000 ug | Freq: Once | INTRAMUSCULAR | Status: AC
  Administered 2018-11-25: 15:00:00 1000 ug via INTRAMUSCULAR

## 2018-11-25 MED ORDER — MEDROXYPROGESTERONE ACETATE 150 MG/ML IM SUSP
150.0000 mg | Freq: Once | INTRAMUSCULAR | Status: AC
  Administered 2018-11-25: 150 mg via INTRAMUSCULAR

## 2018-11-25 NOTE — Progress Notes (Signed)
Per orders of Webb Silversmith, NP (for Allie Bossier, NP), injection of B12 1000 mcg/mL and medroxyprogesterone (DEPO-PROVERA) injection 150 mg given by Gwendolen Hewlett. Patient tolerated injection well.

## 2018-11-26 LAB — VITAMIN B12: Vitamin B-12: 249 pg/mL (ref 211–911)

## 2018-12-02 ENCOUNTER — Telehealth: Payer: Self-pay | Admitting: Primary Care

## 2018-12-02 NOTE — Telephone Encounter (Signed)
Patient called today to change time of appointment that she said she had on 12/1 I advised patient she did not have appointment that day that it looked like Vallarie Mare cancelled the appointment on 11/10.  Patient stated that I needed to find the reason in her chart why it was cancelled because that answer was not good enough for her. I looked in her past appointments and patient had visit done on 11/12 so I explained the visit was cancelled on 12/1 because it may have been to soon to have next injection.  Patient stated well that's a better answer so now can I make the next appointment. Asked the patient is she knew when she was due for next injection. She stated "No you should know all that information its in my chart"  [place] were on the phone at the time of call so I could not get over to them to see if they could look into her chart  I advised the patient I would send a message to Vallarie Mare just to verify when the next injection date would be and we would contact her back as soon as we had an answer. Patient did not like the answer " NO! I will hold while you find out the answer, I do not have time for this"  Explained that they were in with patients and I can send the message straight back and we can call as soon as we find out. Patient proceeded to tell me that it was ridiculous and that she doesn't have time to wait until a call back , who else can help Vallarie Mare or who could the message be sent to and that can sit right here on hold and wait until I go figure out the answer.   I told patient I would let her speak to a supervisor.  Placed her on hold and Had Kaser speak with her

## 2018-12-02 NOTE — Telephone Encounter (Signed)
Noted and greatly appreciate this!

## 2018-12-02 NOTE — Telephone Encounter (Signed)
I spoke with the patient regarding her B12 injections. She is concerned that she was asked if she missed a B12 injection and feels like we should no if she missed an appointment. She was also concerned that she was given different information regarding her Depo.  She stated one person told her the injection had to go in her hip but she received the injection in her arm. She felt like the staff was not on the same page with the information that is being relayed. Going forward she wants to make sure there is consistency with her care. She wants to schedule her B12 shots at one time so that she does not have to call the office each month. She also wants to know how this affects her Depo.   Please advise.

## 2018-12-02 NOTE — Telephone Encounter (Signed)
I called and got patient scheduled for 6 months of B12 and her next 2 depo shots. By the time I finished she was much happier. She was just a little overwhelmed because it is so much and she wants to make sure she has everything scheduled the way it should be.

## 2018-12-02 NOTE — Telephone Encounter (Signed)
Depo Provera injection is clinically approved to be administered DEEP IM to either the dorsal/ventrogluteal or deltoid muscle.  The key here is that deep IM injections should be administered only in a deltoid muscle if the muscle/arm size is large enough and that is patient specific.   I imagine this is where the confusion is coming from. I have trained staff on the administration of this but will remind them and ensure that we are adequately educating patients on the difference between deciding to administer it either in the hip/buttocks area or using the deltoid arm muscle.   Thank you for making me aware of this concern.

## 2018-12-02 NOTE — Telephone Encounter (Signed)
Crystal Diaz, please take note of the Depo Provera injection inconsistency. Looks like we need some education surrounding proper injection sites.  Raquel Sarna, will please get this patient set up for B12 injections monthly for the next six months? Thanks!

## 2018-12-13 ENCOUNTER — Ambulatory Visit (INDEPENDENT_AMBULATORY_CARE_PROVIDER_SITE_OTHER): Payer: No Typology Code available for payment source | Admitting: Psychology

## 2018-12-13 ENCOUNTER — Telehealth: Payer: Self-pay

## 2018-12-13 DIAGNOSIS — F411 Generalized anxiety disorder: Secondary | ICD-10-CM

## 2018-12-13 NOTE — Telephone Encounter (Signed)
Patient does not have an appointment at [facility] at [facility]. This was for behavior health. Will forward message.

## 2018-12-13 NOTE — Telephone Encounter (Signed)
[facility] Night - Client Nonclinical Telephone Record AccessNurse Client [facility] Night - Client Client Site [place] - Night Physician [facility] - PHYSICIAN, NOT LISTED- MD Contact Type Call Who Is Calling Patient / Member / Family / Caregiver Caller Name [place] Phone Number [phone] Patient Name Crystal Diaz Patient DOB 19-Oct-1990 Call Type Message Only Information Provided Reason for Call Request for General Office Information Initial Comment Has 8am virtual appt, having trouble accessing the appt. Pls call back Additional Comment Disp. Time Disposition Final User 12/13/2018 8:00:09 AM General Information Provided Yes Donato Heinz Call Closed By: Donato Heinz Transaction Date/Time: 12/13/2018 7:57:23 AM (ET)

## 2018-12-14 ENCOUNTER — Other Ambulatory Visit: Payer: No Typology Code available for payment source

## 2018-12-14 ENCOUNTER — Ambulatory Visit: Payer: No Typology Code available for payment source

## 2018-12-24 ENCOUNTER — Telehealth: Payer: Self-pay

## 2018-12-24 NOTE — Telephone Encounter (Signed)
Left message to call back to be screened prior to nurse visit on 12/29/2018

## 2018-12-27 ENCOUNTER — Telehealth: Payer: Self-pay

## 2018-12-27 NOTE — Telephone Encounter (Signed)
Pt left v/m requesting cb from [facility] about test results. Pt did not specify which test results.

## 2018-12-28 NOTE — Telephone Encounter (Signed)
Left detail message for patient to call back. Not sure what test result patient is referral to. B12?

## 2018-12-28 NOTE — Telephone Encounter (Signed)
Best number [phone]  Pt returned your call

## 2018-12-29 ENCOUNTER — Other Ambulatory Visit: Payer: Self-pay

## 2018-12-29 ENCOUNTER — Encounter: Payer: Self-pay | Admitting: Podiatry

## 2018-12-29 ENCOUNTER — Ambulatory Visit (INDEPENDENT_AMBULATORY_CARE_PROVIDER_SITE_OTHER): Payer: No Typology Code available for payment source

## 2018-12-29 ENCOUNTER — Ambulatory Visit: Payer: No Typology Code available for payment source | Admitting: Podiatry

## 2018-12-29 DIAGNOSIS — L600 Ingrowing nail: Secondary | ICD-10-CM

## 2018-12-29 DIAGNOSIS — E538 Deficiency of other specified B group vitamins: Secondary | ICD-10-CM

## 2018-12-29 MED ORDER — CYANOCOBALAMIN 1000 MCG/ML IJ SOLN
1000.0000 ug | Freq: Once | INTRAMUSCULAR | Status: AC
  Administered 2018-12-29: 1000 ug via INTRAMUSCULAR

## 2018-12-29 MED ORDER — NEOMYCIN-POLYMYXIN-HC 1 % OT SOLN: OTIC | 1 refills | Status: DC

## 2018-12-29 NOTE — Progress Notes (Signed)
Per orders of Katherine Clark, NP, injection of B12 given by Raffaela Ladley Y.  Patient tolerated injection well.  

## 2018-12-29 NOTE — Progress Notes (Signed)
She presents today chief complaint of painful ingrown toenails tibial borders of the hallux bilaterally.  She states that they have been bothering her for quite some time and she like to have them removed.  ROS: Denies fever chills nausea vomiting muscle aches pains calf pain back pain chest pain shortness of breath.  Objective: Vital signs are stable alert and oriented x3 pulses are strong palpable neurologic sensorium is intact deep and reflexes are intact muscle strength normal symmetrical.  Orthopedic evaluation straits all joints distal ankle full range of motion no crepitation.  Cutaneous evaluation demonstrates sharp incurvated nail margin along the tibial border of the hallux bilateral exquisitely tender on palpation.  No purulence no malodor.  Assessment: Ingrown toenail tibial border of the hallux bilateral.  Plan: Discussed etiology pathology conservative surgical therapies.  Performed chemical matrixectomy tibial border the hallux bilateral.  Tolerated procedure well without complications.  She was provided both oral and written home-going structures for the care and soaking of the foot as well as a prescription for Corticosporin otic.  We will follow-up with her in 2 weeks.  Roselind Messier D.P.M.

## 2018-12-29 NOTE — Telephone Encounter (Signed)
Spoken to patient and it sound like she is having a lot of questions regarding the herpes results that were tested a few months ago.  Recommended that patient to come in the office and discuss with Crystal Diaz since she have some additional questions that would be best for Crystal Diaz to answer.

## 2018-12-29 NOTE — Patient Instructions (Signed)

## 2019-01-03 ENCOUNTER — Telehealth: Payer: Self-pay | Admitting: Primary Care

## 2019-01-03 NOTE — Telephone Encounter (Signed)
Crystal Diaz, were you working on this with her?

## 2019-01-03 NOTE — Telephone Encounter (Signed)
Patient called in regards to [place] sending out an appeal form to her insurance company for her b12.  She stated that she spoke with insurance and on that appeal form she stated that it made it sound like it needed to say a life abstaining procedure  Patient is not sure if this was already on the form or something that needed to be added. She is still receiving bills for the b12

## 2019-01-04 NOTE — Telephone Encounter (Signed)
Patient is returning your call.  

## 2019-01-04 NOTE — Telephone Encounter (Signed)
Message left for patient to return my call.  

## 2019-01-17 NOTE — Telephone Encounter (Signed)
Will discuss with patient at appointment on 01/19/2019

## 2019-01-19 ENCOUNTER — Ambulatory Visit: Payer: No Typology Code available for payment source | Admitting: Primary Care

## 2019-01-26 ENCOUNTER — Other Ambulatory Visit: Payer: Self-pay

## 2019-01-26 ENCOUNTER — Ambulatory Visit (INDEPENDENT_AMBULATORY_CARE_PROVIDER_SITE_OTHER): Payer: No Typology Code available for payment source | Admitting: Podiatry

## 2019-01-26 ENCOUNTER — Encounter: Payer: Self-pay | Admitting: Podiatry

## 2019-01-26 DIAGNOSIS — L600 Ingrowing nail: Secondary | ICD-10-CM

## 2019-01-26 DIAGNOSIS — Z9889 Other specified postprocedural states: Secondary | ICD-10-CM

## 2019-01-26 NOTE — Progress Notes (Signed)
She presents today for follow-up of her nail procedure.  As she refers to her hallux bilaterally.  States that is a little sore still draining some but doing well.  Objective: Vital signs are stable she is alert and oriented x3 there is mild edema no erythema cellulitis drainage or odor postinflammatory hyperpigmentation is present.  Assessment: Well-healing surgical toes.  Plan: Continue to soak every other day Epson salts and warm water follow-up with me if these do not going to heal completely.

## 2019-01-26 NOTE — Patient Instructions (Signed)

## 2019-02-01 ENCOUNTER — Telehealth: Payer: Self-pay

## 2019-02-01 NOTE — Telephone Encounter (Signed)
Pt has a nurse visit apt tomorrow for B12 injection. Need to screen pt for covid symptoms and advise injection will be curbside. LVM for pt to call

## 2019-02-02 ENCOUNTER — Other Ambulatory Visit: Payer: Self-pay

## 2019-02-02 ENCOUNTER — Ambulatory Visit (INDEPENDENT_AMBULATORY_CARE_PROVIDER_SITE_OTHER): Payer: No Typology Code available for payment source

## 2019-02-02 DIAGNOSIS — E538 Deficiency of other specified B group vitamins: Secondary | ICD-10-CM

## 2019-02-02 MED ORDER — CYANOCOBALAMIN 1000 MCG/ML IJ SOLN
1000.0000 ug | Freq: Once | INTRAMUSCULAR | Status: AC
  Administered 2019-02-02: 1000 ug via INTRAMUSCULAR

## 2019-02-02 NOTE — Progress Notes (Signed)
Per orders of NP Katherine Clark, injection of vit B12 given by Idonna Heeren. Patient tolerated injection well.  

## 2019-02-15 ENCOUNTER — Telehealth: Payer: Self-pay

## 2019-02-15 NOTE — Telephone Encounter (Signed)
Pt requesting this office f/u with her insurance to see why she is being charged for her B12 injections. Her insurance told her that they need something from this office but she could not say what it was. She said the injections should be covered because it is at her PCP office.  Advised that [name] has already sent information to her insurance that reported she could not take oral B12 because she does not absorb it. Pt reports that is not enough and she thinks they need it to say it is life sustaining.  She would like to know what will happen to her if she does not take the injections. Advised this nurse would f/u and get back to her. Pt verbalized understanding.

## 2019-02-15 NOTE — Telephone Encounter (Signed)
Contacted pt's insurance and spoke with Randa Evens who reports the only diagnosis that is covered for B12 injections is anemia. They would need a letter and the diagnosis changed on the order.   Advised PCP and she recommended pt have apt.  Contacted pt and scheduled her for VV tomorrow morning and then she can have her NV tomorrow afternoon and get labs at the same time. Pt appreciative and verbalized understanding.

## 2019-02-15 NOTE — Telephone Encounter (Signed)
Number used to contact insurance was [phone].  Insurance gave fax number of [phone] and an email for correspondence of [email or web address]  If a letter is written for this pt it can be faxed or emailed to number and email above.

## 2019-02-16 ENCOUNTER — Ambulatory Visit (INDEPENDENT_AMBULATORY_CARE_PROVIDER_SITE_OTHER): Payer: No Typology Code available for payment source | Admitting: *Deleted

## 2019-02-16 ENCOUNTER — Other Ambulatory Visit (INDEPENDENT_AMBULATORY_CARE_PROVIDER_SITE_OTHER): Payer: No Typology Code available for payment source

## 2019-02-16 ENCOUNTER — Ambulatory Visit (INDEPENDENT_AMBULATORY_CARE_PROVIDER_SITE_OTHER): Payer: No Typology Code available for payment source | Admitting: Primary Care

## 2019-02-16 ENCOUNTER — Other Ambulatory Visit: Payer: Self-pay

## 2019-02-16 DIAGNOSIS — E559 Vitamin D deficiency, unspecified: Secondary | ICD-10-CM | POA: Diagnosis not present

## 2019-02-16 DIAGNOSIS — E538 Deficiency of other specified B group vitamins: Secondary | ICD-10-CM

## 2019-02-16 DIAGNOSIS — Z9884 Bariatric surgery status: Secondary | ICD-10-CM | POA: Diagnosis not present

## 2019-02-16 DIAGNOSIS — Z3042 Encounter for surveillance of injectable contraceptive: Secondary | ICD-10-CM | POA: Diagnosis not present

## 2019-02-16 DIAGNOSIS — K909 Intestinal malabsorption, unspecified: Secondary | ICD-10-CM

## 2019-02-16 MED ORDER — MEDROXYPROGESTERONE ACETATE 150 MG/ML IM SUSP
150.0000 mg | Freq: Once | INTRAMUSCULAR | Status: AC
  Administered 2019-02-16: 16:00:00 150 mg via INTRAMUSCULAR

## 2019-02-16 NOTE — Assessment & Plan Note (Signed)
Compliant mostly to monthly injections, has several bills for having injections completed in our office. Will contact insurance company to find out how we can get these visits covered.  I do believe that she needs continued B12 injections given her bypass and the lack of significant increase in B12 with monthly injections.  Other labs pending including Folate, CBC, B12, Vitamin D, TSH, IBC panel.  Once labs return we will plan to send a script for B12 injections to her pharmacy.

## 2019-02-16 NOTE — Progress Notes (Signed)
Subjective:    Patient ID: Crystal Diaz, female    DOB: 12-12-1990, 29 y.o.   MRN: [id number]  HPI  Virtual Visit via Video Note  I connected with Crystal Diaz on 02/16/19 at 10:40 AM EST by a video enabled telemedicine application and verified that I am speaking with the correct person using two identifiers.  Location: Patient: Home Provider: Office   I discussed the limitations of evaluation and management by telemedicine and the availability of in person appointments. The patient expressed understanding and agreed to proceed.  History of Present Illness:  Ms. Greulich is a 29 year old female with a history of gastric bypass, vitamin B12 deficiency, anemia, GERD who presents today for follow up of B12 deficiency.  Chronic since checked by our team in November 2019. She underwent lap roux en Y bypass, following with Dr. Hassell Done through [facility] in March 2019, due for annual follow up tomorrow. Deficiency was suspected to be secondary to bypass.   History of "borderline anemia" prior to gastric bypass, no formal diagnosis of anemia. During our initial visit in November 2019 she hadn't undergone recent lab testing so we completed a panel of vitamins, CBC, etc. She was found to be very deficient in vitamin B12 with normal RBC's on CBC and borderline low iron. She was initiated on monthly B12 injections. She's been compliant to B12 injections nearly every month in late 2019 and 2020 but has been getting "bills" for clinical nurse visits due to monthly injections.  She has contacted her insurance company who has told her that a diagnosis of B12 deficiency wasn't suitable which was why she was receiving bills. Our lead nurse called her insurance yesterday and was told that the only diagnosis they will cover is "anemia".   Her last B12 level was 249 in November 2020. She is due for repeat labs today. She is compliant to her bariatric vitamin. She works at [employer] and is surrounded by  nurses who can administer her B12 injections. She feels comfortable with [place] sending prescriptions for B12 to her pharmacy.   Observations/Objective:  Alert and oriented. Appears well, not sickly. No distress. Speaking in complete sentences.  Assessment and Plan:  See problem based charting.  Follow Up Instructions:  Come by for labs today as discussed.  Follow up with Dr. Hassell Done as scheduled.  It was a pleasure to see you today! Allie Bossier, NP-C    I discussed the assessment and treatment plan with the patient. The patient was provided an opportunity to ask questions and all were answered. The patient agreed with the plan and demonstrated an understanding of the instructions.   The patient was advised to call back or seek an in-person evaluation if the symptoms worsen or if the condition fails to improve as anticipated.   Pleas Koch, NP    Review of Systems  Constitutional: Negative for fatigue.  Respiratory: Negative for shortness of breath.   Cardiovascular: Negative for chest pain.  Skin: Negative for color change.       Past Medical History:  Diagnosis Date  . Anemia   . Anxiety   . GERD (gastroesophageal reflux disease)   . Headache   . Lumbar radiculopathy   . PCOS (polycystic ovarian syndrome)   . Plantar fasciitis, bilateral      Social History   Socioeconomic History  . Marital status: Single    Spouse name: Not on file  . Number of children: Not on file  .  Years of education: Not on file  . Highest education level: Not on file  Occupational History  . Not on file  Tobacco Use  . Smoking status: Former Smoker    Packs/day: 0.25    Years: 4.00    Pack years: 1.00    Types: Cigarettes    Quit date: 01/19/2017    Years since quitting: 2.0  . Smokeless tobacco: Never Used  Substance and Sexual Activity  . Alcohol use: Yes    Alcohol/week: 0.0 standard drinks    Comment: occ  . Drug use: No  . Sexual activity: Not on file  Other  Topics Concern  . Not on file  Social History Narrative   Single. In a relationship.    One son.   Works as Geographical information systems officer.    Enjoys spending time with her son, traveling.   Social Determinants of Health   Financial Resource Strain:   . Difficulty of Paying Living Expenses: Not on file  Food Insecurity:   . Worried About Programme researcher, broadcasting/film/video in the Last Year: Not on file  . Ran Out of Food in the Last Year: Not on file  Transportation Needs:   . Lack of Transportation (Medical): Not on file  . Lack of Transportation (Non-Medical): Not on file  Physical Activity:   . Days of Exercise per Week: Not on file  . Minutes of Exercise per Session: Not on file  Stress:   . Feeling of Stress : Not on file  Social Connections:   . Frequency of Communication with Friends and Family: Not on file  . Frequency of Social Gatherings with Friends and Family: Not on file  . Attends Religious Services: Not on file  . Active Member of Clubs or Organizations: Not on file  . Attends Banker Meetings: Not on file  . Marital Status: Not on file  Intimate Partner Violence:   . Fear of Current or Ex-Partner: Not on file  . Emotionally Abused: Not on file  . Physically Abused: Not on file  . Sexually Abused: Not on file    Past Surgical History:  Procedure Laterality Date  . FOOT SURGERY Right   . 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   Procedure: LAPAROSCOPIC ROUX-EN-Y GASTRIC BYPASS WITH HIATAL HERNIA REPAIR AND UPPER ENDOSCOPY;  Surgeon: Luretha Murphy, MD;  Location: [facility];  Service: General;  Laterality: N/A;  . WISDOM TOOTH EXTRACTION      Family History  Problem Relation Age of Onset  . Diabetes Mother   . Pancreatic cancer Father 49  . Cancer Brother        Oral    No Known Allergies  Current Outpatient Medications on File Prior to Visit  Medication Sig Dispense Refill  . calcium carbonate (TUMS - DOSED IN MG ELEMENTAL CALCIUM) 500 MG chewable tablet Chew 1 tablet by  mouth 3 (three) times daily.    . cyanocobalamin (,VITAMIN B-12,) 1000 MCG/ML injection Inject 1,000 mcg into the muscle every 30 (thirty) days.    . MedroxyPROGESTERone Acetate 150 MG/ML SUSY Inject 150 mg into the skin every 3 (three) months.   3  . NEOMYCIN-POLYMYXIN-HYDROCORTISONE (CORTISPORIN) 1 % SOLN OTIC solution Apply 1-2 drops to toe BID after soaking 10 mL 1  . pantoprazole (PROTONIX) 40 MG tablet Take 40 mg by mouth.   2  . Vitamin D, Ergocalciferol, (DRISDOL) 1.25 MG (50000 UT) CAPS capsule Take 1 capsule by mouth once weekly. (Patient not taking: Reported on 02/16/2019) 12 capsule  0   No current facility-administered medications on file prior to visit.    There were no vitals taken for this visit.   Objective:   Physical Exam  Constitutional: She is oriented to person, place, and time. She appears well-nourished.  Respiratory: Effort normal.  Neurological: She is alert and oriented to person, place, and time.  Psychiatric: She has a normal mood and affect.           Assessment & Plan:

## 2019-02-16 NOTE — Assessment & Plan Note (Signed)
Following with [facility], appointment scheduled for tomorrow.

## 2019-02-16 NOTE — Patient Instructions (Signed)
  Come by for labs today as discussed.  Follow up with Dr. Daphine Deutscher as scheduled.  It was a pleasure to see you today! Mayra Reel, NP-C

## 2019-02-16 NOTE — Progress Notes (Signed)
Per orders of Vernona Rieger, NP, injection of Depo Provera 150mg /mL given by , Crystal Diaz. Patient tolerated injection well.  Next due date window is 4/12-5/5

## 2019-02-17 LAB — BASIC METABOLIC PANEL
BUN: 8 mg/dL (ref 6–23)
CO2: 25 mEq/L (ref 19–32)
Calcium: 9.1 mg/dL (ref 8.4–10.5)
Chloride: 110 mEq/L (ref 96–112)
Creatinine, Ser: 0.71 mg/dL (ref 0.40–1.20)
GFR: 116.17 mL/min (ref >60.00)
Glucose, Bld: 62 mg/dL — ABNORMAL LOW (ref 70–99)
Potassium: 3.6 mEq/L (ref 3.5–5.1)
Sodium: 141 mEq/L (ref 135–145)

## 2019-02-17 LAB — CBC
HCT: 39.9 % (ref 36.0–46.0)
Hemoglobin: 13 g/dL (ref 12.0–15.0)
MCHC: 32.6 g/dL (ref 30.0–36.0)
MCV: 91 fl (ref 78.0–100.0)
Platelets: 289 10*3/uL (ref 150.0–400.0)
RBC: 4.39 Mil/uL (ref 3.87–5.11)
RDW: 14.2 % (ref 11.5–15.5)
WBC: 10.6 10*3/uL — ABNORMAL HIGH (ref 4.0–10.5)

## 2019-02-17 LAB — IBC + FERRITIN
Ferritin: 21 ng/mL (ref 10.0–291.0)
Iron: 67 ug/dL (ref 42–145)
Saturation Ratios: 21.6 % (ref 20.0–50.0)
Transferrin: 222 mg/dL (ref 212.0–360.0)

## 2019-02-17 LAB — VITAMIN B12: Vitamin B-12: 410 pg/mL (ref 211–911)

## 2019-02-17 LAB — TSH: TSH: 0.47 u[IU]/mL (ref 0.35–4.50)

## 2019-02-17 LAB — FOLATE: Folate: 7.6 ng/mL (ref >5.9)

## 2019-02-17 LAB — VITAMIN D 25 HYDROXY (VIT D DEFICIENCY, FRACTURES): VITD: 14.88 ng/mL — ABNORMAL LOW (ref 30.00–100.00)

## 2019-02-22 NOTE — Progress Notes (Signed)
Approved.  

## 2019-03-08 ENCOUNTER — Telehealth: Payer: Self-pay

## 2019-03-08 DIAGNOSIS — K909 Intestinal malabsorption, unspecified: Secondary | ICD-10-CM

## 2019-03-08 DIAGNOSIS — E538 Deficiency of other specified B group vitamins: Secondary | ICD-10-CM

## 2019-03-08 MED ORDER — "SYRINGE/NEEDLE (DISP) 23G X 1"" 1 ML MISC": 0 refills | Status: DC

## 2019-03-08 MED ORDER — CYANOCOBALAMIN 1000 MCG/ML IJ SOLN: 1000.0000 ug | INTRAMUSCULAR | 1 refills | Status: DC

## 2019-03-08 NOTE — Telephone Encounter (Signed)
Contacted pt to screen her for covid for upcoming NV for B12. Pt said she is not sure what she is doing and she thought she was going to do the injections but hasn't heard anything from this office. Pt got upset on the phone and reported she was having an anxiety attack and requested help. Advised pt she needs an apt to discuss with PCP. Pt agreed but could not have apt until 2/25. Scheduled pt and advised if anything is needed before apt to contact this office.

## 2019-03-08 NOTE — Telephone Encounter (Signed)
Thank you. Yes, she will now have one of the nurses at her occupation administer these injections due to lack of insurance support having them done in our office. She and I discussed this last month as insurance does not seem to cover in our office. I will send an updated prescription for her to resume monthly with her next dose due now. Will you communicate this to her? She also needs a repeat B12 lab for 6 months, will you have Irving Burton set her up for this?

## 2019-03-09 ENCOUNTER — Ambulatory Visit: Payer: No Typology Code available for payment source

## 2019-03-09 NOTE — Telephone Encounter (Signed)
Noted, will discuss during upcoming visit. 

## 2019-03-10 ENCOUNTER — Ambulatory Visit (INDEPENDENT_AMBULATORY_CARE_PROVIDER_SITE_OTHER): Payer: No Typology Code available for payment source | Admitting: Primary Care

## 2019-03-10 ENCOUNTER — Encounter: Payer: Self-pay | Admitting: Primary Care

## 2019-03-10 ENCOUNTER — Other Ambulatory Visit: Payer: Self-pay

## 2019-03-10 ENCOUNTER — Telehealth: Payer: Self-pay

## 2019-03-10 VITALS — BP 116/70 | HR 104 | Temp 97.4°F | Ht 69.0 in | Wt 198.2 lb

## 2019-03-10 DIAGNOSIS — E538 Deficiency of other specified B group vitamins: Secondary | ICD-10-CM

## 2019-03-10 DIAGNOSIS — F411 Generalized anxiety disorder: Secondary | ICD-10-CM

## 2019-03-10 DIAGNOSIS — K909 Intestinal malabsorption, unspecified: Secondary | ICD-10-CM | POA: Diagnosis not present

## 2019-03-10 MED ORDER — HYDROXYZINE HCL 10 MG PO TABS: 10.0000 mg | ORAL_TABLET | Freq: Two times a day (BID) | ORAL | 0 refills | Status: DC | PRN

## 2019-03-10 NOTE — Patient Instructions (Signed)
You may take the hydroxyzine 10 mg as needed for anxiety. Start with 1 tablet, may increase to 2 tablets if needed.  Please reach out to me anytime!  It was a pleasure to see you today!

## 2019-03-10 NOTE — Telephone Encounter (Signed)
Pt in today for OV with PCP and also needs education on self injection of B12.  Educated pt with sterile water and syringe and needle. No injection was given. Pt performed return demonstration. Answered all questions. Advised if any further questions to contact office or talk to pharmacist. Pt verbalized understanding.

## 2019-03-10 NOTE — Progress Notes (Signed)
Subjective:    Patient ID: Crystal Diaz, female    DOB: October 24, 1990, 29 y.o.   MRN: [id number]  HPI  This visit occurred during the SARS-CoV-2 public health emergency.  Safety protocols were in place, including screening questions prior to the visit, additional usage of staff PPE, and extensive cleaning of exam room while observing appropriate contact time as indicated for disinfecting solutions.   Crystal Diaz is a 29 year old female with a history of vitamin B12 deficiency, GERD, gastric bypass, anxiety disorder who presents today to discuss anxiety.   She would like to discuss anxiety. Symptoms of feeling anxious, worry, difficulty managing stressful situations, feeling overwhelmed. She lost her mother in January 2021, has been dealing with a lot regarding her mother's estate. Her son has noticed her mother is being "mean".   She is in therapy, has had four sessions total, sometimes feels more anxious during sessions. She was once managed on Lexapro and Zoloft, didn't feel as they were effective. She also doesn't want to take something on a daily basis.   BP Readings from Last 3 Encounters:  03/10/19 116/70  10/19/18 120/80  03/23/18 116/72     Review of Systems  Respiratory: Negative for shortness of breath.   Cardiovascular: Negative for chest pain.  Neurological: Negative for headaches.  Psychiatric/Behavioral:       See HPI       Past Medical History:  Diagnosis Date  . Anemia   . Anxiety   . GERD (gastroesophageal reflux disease)   . Headache   . Lumbar radiculopathy   . PCOS (polycystic ovarian syndrome)   . Plantar fasciitis, bilateral      Social History   Socioeconomic History  . Marital status: Single    Spouse name: Not on file  . Number of children: Not on file  . Years of education: Not on file  . Highest education level: Not on file  Occupational History  . Not on file  Tobacco Use  . Smoking status: Former Smoker    Packs/day: 0.25    Years:  4.00    Pack years: 1.00    Types: Cigarettes    Quit date: 01/19/2017    Years since quitting: 2.1  . Smokeless tobacco: Never Used  Substance and Sexual Activity  . Alcohol use: Yes    Alcohol/week: 0.0 standard drinks    Comment: occ  . Drug use: No  . Sexual activity: Not on file  Other Topics Concern  . Not on file  Social History Narrative   Single. In a relationship.    One son.   Works as Geographical information systems officer.    Enjoys spending time with her son, traveling.   Social Determinants of Health   Financial Resource Strain:   . Difficulty of Paying Living Expenses: Not on file  Food Insecurity:   . Worried About Programme researcher, broadcasting/film/video in the Last Year: Not on file  . Ran Out of Food in the Last Year: Not on file  Transportation Needs:   . Lack of Transportation (Medical): Not on file  . Lack of Transportation (Non-Medical): Not on file  Physical Activity:   . Days of Exercise per Week: Not on file  . Minutes of Exercise per Session: Not on file  Stress:   . Feeling of Stress : Not on file  Social Connections:   . Frequency of Communication with Friends and Family: Not on file  . Frequency of Social Gatherings with Friends  and Family: Not on file  . Attends Religious Services: Not on file  . Active Member of Clubs or Organizations: Not on file  . Attends Archivist Meetings: Not on file  . Marital Status: Not on file  Intimate Partner Violence:   . Fear of Current or Ex-Partner: Not on file  . Emotionally Abused: Not on file  . Physically Abused: Not on file  . Sexually Abused: Not on file    Past Surgical History:  Procedure Laterality Date  . FOOT SURGERY Right   . 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   Procedure: LAPAROSCOPIC ROUX-EN-Y GASTRIC BYPASS WITH HIATAL HERNIA REPAIR AND UPPER ENDOSCOPY;  Surgeon: Johnathan Hausen, MD;  Location: [facility];  Service: General;  Laterality: N/A;  . WISDOM TOOTH EXTRACTION      Family History  Problem Relation Age of Onset    . Diabetes Mother   . Pancreatic cancer Father 59  . Cancer Brother        Oral    No Known Allergies  Current Outpatient Medications on File Prior to Visit  Medication Sig Dispense Refill  . calcium carbonate (TUMS - DOSED IN MG ELEMENTAL CALCIUM) 500 MG chewable tablet Chew 1 tablet by mouth 3 (three) times daily.    . cyanocobalamin (,VITAMIN B-12,) 1000 MCG/ML injection Inject 1 mL (1,000 mcg total) into the muscle every 30 (thirty) days. 3 mL 1  . MedroxyPROGESTERone Acetate 150 MG/ML SUSY Inject 150 mg into the skin every 3 (three) months.   3  . NEOMYCIN-POLYMYXIN-HYDROCORTISONE (CORTISPORIN) 1 % SOLN OTIC solution Apply 1-2 drops to toe BID after soaking 10 mL 1  . pantoprazole (PROTONIX) 40 MG tablet Take 40 mg by mouth.   2  . SYRINGE/NEEDLE, DISP, 1 ML 23G X 1" 1 ML MISC Use once monthly with vitamin B12 vial. 6 each 0  . Vitamin D, Ergocalciferol, (DRISDOL) 1.25 MG (50000 UT) CAPS capsule Take 1 capsule by mouth once weekly. (Patient not taking: Reported on 03/10/2019) 12 capsule 0   No current facility-administered medications on file prior to visit.    BP 116/70   Pulse (!) 104   Temp (!) 97.4 F (36.3 C) (Temporal)   Ht 5\' 9"  (1.753 m)   Wt 198 lb 4 oz (89.9 kg)   SpO2 98%   BMI 29.28 kg/m    Objective:   Physical Exam  Constitutional: She appears well-nourished.  Cardiovascular: Normal rate and regular rhythm.  Respiratory: Effort normal and breath sounds normal.  Musculoskeletal:     Cervical back: Neck supple.  Skin: Skin is warm and dry.  Psychiatric:  Appears anxious            Assessment & Plan:

## 2019-03-10 NOTE — Assessment & Plan Note (Signed)
Will now be administering B12 injections at home as insurance will not cover having them done in our office. Our nurse will be demonstrating proper technique for administration today.   Rx sent to pharmacy.

## 2019-03-10 NOTE — Assessment & Plan Note (Signed)
Evident today, also going through a lot with her recent mother's passing. I recommended we try another medication for daily anxiety symptoms as she does have chronic anxiety along now with situational anxiety, she declines.   She would like something PRN to use for anxiety. Rx for hydroxyzine sent to pharmacy, drowsiness precautions provided. She will update.

## 2019-03-16 NOTE — Telephone Encounter (Signed)
Contacted pt to inquire if she had performed an injection yet. Pt said she has tried and put it off but it is due today and she is going to try again. Advised if any help or encouragement is needed to contact the office and we can talk it through. Advised also that pt needs a 6 month lab apt to recheck B12. Pt said she would call back when she had her schedule to make lab apt. Pt appreciative and verbalized understanding.

## 2019-03-17 NOTE — Telephone Encounter (Signed)
Pt called to report she did give herself the injection and just wanted this nurse to know. Pt expressed appreciation for support. Advised if anything else is needed to contact this office. Pt verbalized understanding.

## 2019-04-04 DIAGNOSIS — F411 Generalized anxiety disorder: Secondary | ICD-10-CM

## 2019-04-06 MED ORDER — ESCITALOPRAM OXALATE 5 MG PO TABS: 5.0000 mg | ORAL_TABLET | Freq: Every day | ORAL | 1 refills | Status: DC

## 2019-04-08 NOTE — Telephone Encounter (Signed)
FYI. She will be scheduling for Tdap.

## 2019-04-12 ENCOUNTER — Ambulatory Visit (INDEPENDENT_AMBULATORY_CARE_PROVIDER_SITE_OTHER): Payer: No Typology Code available for payment source

## 2019-04-12 ENCOUNTER — Other Ambulatory Visit: Payer: Self-pay

## 2019-04-12 DIAGNOSIS — Z23 Encounter for immunization: Secondary | ICD-10-CM

## 2019-04-13 ENCOUNTER — Ambulatory Visit: Payer: No Typology Code available for payment source

## 2019-04-21 ENCOUNTER — Telehealth: Payer: Self-pay | Admitting: *Deleted

## 2019-04-21 NOTE — Telephone Encounter (Signed)
"  I called there yesterday and no one called me back."  The nurse is assisting a doctor at the moment.  Is there anything I can help you with.  "Are you a nurse?"  I am not a nurse but I'm a CMA.  "I want to talk to a nurse."  She's not available now, it may take a while for her to answer.  "I'll just hold on line.  I have called several times.  It's hard to get a person."  I'll let her know you are waiting.  I can't allow you to wait on hold.  It's tying up my phone line.  As I mentioned before Angie cannot answer at the moment.  She's assisting a doctor.  I'm a Scientist, forensic.  I can try and answer your question.  "I had an ingrown toenail procedure done a couple of months ago.  My toenail is still sore.  It hurts for a shoe to touch it.  What do you think is going on?  What should I do?"  You may have a nail spicule.  The chemical that is used to treat the toenail is not 100% effective, nails can grow back.  A little piece of the nail may have grown back and is causing the pain.  Would you like to schedule an appointment with Dr. Al Corpus for a nail check?  "No, I'll just look up what you said.  I'll keep an eye on it.  If it gets worse, I'll give you a call back.  Thank you for your help."

## 2019-04-25 ENCOUNTER — Telehealth: Payer: Self-pay

## 2019-04-25 NOTE — Telephone Encounter (Signed)
Pt called this morning requesting info about syringes she received at the pharmacy for B12 injections. Answered all questions and advised if any other concerns to contact office. Pt appreciative and verbalized understanding.

## 2019-05-11 ENCOUNTER — Other Ambulatory Visit: Payer: Self-pay

## 2019-05-11 ENCOUNTER — Ambulatory Visit: Payer: No Typology Code available for payment source

## 2019-05-12 ENCOUNTER — Ambulatory Visit (INDEPENDENT_AMBULATORY_CARE_PROVIDER_SITE_OTHER): Payer: No Typology Code available for payment source

## 2019-05-12 DIAGNOSIS — Z3042 Encounter for surveillance of injectable contraceptive: Secondary | ICD-10-CM | POA: Diagnosis not present

## 2019-05-12 MED ORDER — MEDROXYPROGESTERONE ACETATE 150 MG/ML IM SUSP
150.0000 mg | Freq: Once | INTRAMUSCULAR | Status: AC
  Administered 2019-05-12: 150 mg via INTRAMUSCULAR

## 2019-05-12 NOTE — Progress Notes (Signed)
Per orders of Nicki Reaper, NP in Roe Coombs, NP absence, injection of medroxyprogesterone given by Sherrie George. Patient tolerated injection well.

## 2019-05-18 ENCOUNTER — Ambulatory Visit: Payer: No Typology Code available for payment source

## 2019-05-23 ENCOUNTER — Other Ambulatory Visit: Payer: Self-pay | Admitting: Primary Care

## 2019-05-23 DIAGNOSIS — E559 Vitamin D deficiency, unspecified: Secondary | ICD-10-CM

## 2019-06-09 ENCOUNTER — Ambulatory Visit (INDEPENDENT_AMBULATORY_CARE_PROVIDER_SITE_OTHER): Payer: No Typology Code available for payment source | Admitting: Psychology

## 2019-06-09 DIAGNOSIS — F411 Generalized anxiety disorder: Secondary | ICD-10-CM

## 2019-06-22 ENCOUNTER — Ambulatory Visit: Payer: No Typology Code available for payment source

## 2019-07-14 ENCOUNTER — Telehealth: Payer: Self-pay | Admitting: Primary Care

## 2019-07-14 NOTE — Telephone Encounter (Signed)
We can repeat B12 anytime now. Lab appointment is fine. I asked her to reach out to her gastric bypass surgeon in February, see my chart message question. Did she ever reach out?

## 2019-07-14 NOTE — Telephone Encounter (Signed)
Patient called in regards to her b12  She stated that she has been giving herself these at home and wanted to know if or when you wanted her to come back in for a follow up and labs to see how it is going

## 2019-07-14 NOTE — Telephone Encounter (Signed)
Lab appointment have been scheduled on 07/26/2019. No, she does know why she would need to, she knows why needs the B12.

## 2019-07-15 ENCOUNTER — Other Ambulatory Visit: Payer: Self-pay | Admitting: Primary Care

## 2019-07-15 DIAGNOSIS — K909 Intestinal malabsorption, unspecified: Secondary | ICD-10-CM

## 2019-07-15 DIAGNOSIS — E538 Deficiency of other specified B group vitamins: Secondary | ICD-10-CM

## 2019-07-26 ENCOUNTER — Other Ambulatory Visit: Payer: Self-pay

## 2019-07-26 ENCOUNTER — Other Ambulatory Visit (INDEPENDENT_AMBULATORY_CARE_PROVIDER_SITE_OTHER): Payer: No Typology Code available for payment source

## 2019-07-26 DIAGNOSIS — K909 Intestinal malabsorption, unspecified: Secondary | ICD-10-CM

## 2019-07-26 DIAGNOSIS — E538 Deficiency of other specified B group vitamins: Secondary | ICD-10-CM | POA: Diagnosis not present

## 2019-07-26 LAB — VITAMIN B12: Vitamin B-12: 273 pg/mL (ref 211–911)

## 2019-08-10 ENCOUNTER — Other Ambulatory Visit: Payer: Self-pay

## 2019-08-10 ENCOUNTER — Ambulatory Visit (INDEPENDENT_AMBULATORY_CARE_PROVIDER_SITE_OTHER): Payer: No Typology Code available for payment source

## 2019-08-10 ENCOUNTER — Telehealth: Payer: Self-pay

## 2019-08-10 DIAGNOSIS — Z3042 Encounter for surveillance of injectable contraceptive: Secondary | ICD-10-CM | POA: Diagnosis not present

## 2019-08-10 MED ORDER — MEDROXYPROGESTERONE ACETATE 150 MG/ML IM SUSP
150.0000 mg | Freq: Once | INTRAMUSCULAR | Status: AC
  Administered 2019-08-10: 150 mg via INTRAMUSCULAR

## 2019-08-10 NOTE — Progress Notes (Signed)
Per orders of Mayra Reel, NP, injection of depo-provera given by Sherrie George. Patient tolerated injection well.

## 2019-08-10 NOTE — Telephone Encounter (Signed)
Pt has apt for depo-provero on 8/3. Last inj was 4/29 so she will be due between 7/15/ and 7/29.  Contacted pt and advised. Pt is going out of town tomorrow. Advised to come in now. Pt will be here in 15 min.  Added pt to nurse schedule

## 2019-08-16 ENCOUNTER — Ambulatory Visit: Payer: No Typology Code available for payment source

## 2019-08-26 MED FILL — NICOTINE 2 MG CHEWING GUM: 2 | 15 days supply | Qty: 110 | Fill #0

## 2019-09-13 ENCOUNTER — Other Ambulatory Visit: Payer: Self-pay | Admitting: Primary Care

## 2019-09-13 DIAGNOSIS — E559 Vitamin D deficiency, unspecified: Secondary | ICD-10-CM

## 2019-09-13 NOTE — Telephone Encounter (Signed)
Vitamin D refill.  Last seen 02/2019.  Last filled 05/2019.  Should patient be taking 5,000 or 50,000, please advise.

## 2019-09-13 NOTE — Telephone Encounter (Signed)
Can we get her in for a CPE so I can go over everything and update routine labs? No rush, but sometime before the end of the year?  I don't believe that she is taking the vitamin D, find out what she is taking.

## 2019-09-14 ENCOUNTER — Telehealth: Payer: Self-pay

## 2019-09-14 NOTE — Telephone Encounter (Signed)
Patient informed that vit d will be discussed at her appointment.

## 2019-09-14 NOTE — Telephone Encounter (Signed)
Please notify patient that that is fine, I see she has an appointment scheduled for next week, we will recheck vitamin D at that time.  Once I have her lab back we can send in the weekly vitamin D.

## 2019-09-14 NOTE — Telephone Encounter (Signed)
Pt called back about Vit D. She says she would like to take Vit D weekly instead of daily Vit D. Please rx to [facility].  Please call [phone] with any question.

## 2019-09-21 ENCOUNTER — Ambulatory Visit: Payer: No Typology Code available for payment source | Admitting: Primary Care

## 2019-10-27 ENCOUNTER — Other Ambulatory Visit: Payer: Self-pay

## 2019-10-27 ENCOUNTER — Encounter: Payer: Self-pay | Admitting: Primary Care

## 2019-10-27 ENCOUNTER — Ambulatory Visit (INDEPENDENT_AMBULATORY_CARE_PROVIDER_SITE_OTHER): Payer: No Typology Code available for payment source | Admitting: Primary Care

## 2019-10-27 ENCOUNTER — Other Ambulatory Visit: Payer: Self-pay | Admitting: Primary Care

## 2019-10-27 VITALS — BP 116/74 | HR 84 | Temp 98.3°F | Ht 69.0 in | Wt 215.0 lb

## 2019-10-27 DIAGNOSIS — E559 Vitamin D deficiency, unspecified: Secondary | ICD-10-CM | POA: Diagnosis not present

## 2019-10-27 DIAGNOSIS — F411 Generalized anxiety disorder: Secondary | ICD-10-CM | POA: Diagnosis not present

## 2019-10-27 DIAGNOSIS — E538 Deficiency of other specified B group vitamins: Secondary | ICD-10-CM

## 2019-10-27 DIAGNOSIS — K909 Intestinal malabsorption, unspecified: Secondary | ICD-10-CM

## 2019-10-27 MED ORDER — VITAMIN D (ERGOCALCIFEROL) 1.25 MG (50000 UNIT) PO CAPS: ORAL_CAPSULE | ORAL | 1 refills | Status: DC

## 2019-10-27 MED ORDER — CYANOCOBALAMIN 1000 MCG/ML IJ SOLN: 1000.0000 ug | INTRAMUSCULAR | 1 refills | Status: DC

## 2019-10-27 MED ORDER — SERTRALINE HCL 50 MG PO TABS: 50.0000 mg | ORAL_TABLET | Freq: Every day | ORAL | 1 refills | Status: DC

## 2019-10-27 NOTE — Assessment & Plan Note (Signed)
Compliant most months. Refills sent to pharmacy. Will check B12 at next visit.

## 2019-10-27 NOTE — Assessment & Plan Note (Addendum)
Uncontrolled, ready for treatment. Did not give Zoloft enough time to take effect last time. She will be inquiring about FMLA from her employer.  Rx for Zoloft 50 mg sent to pharmacy. She is also scheduled to meet with therapy.  Patient is to take 1/2 tablet daily for 8 days, then advance to 1 full tablet thereafter. We discussed possible side effects of headache, GI upset, drowsiness, and SI/HI. If thoughts of SI/HI develop, we discussed to present to the emergency immediately. Patient verbalized understanding.   Follow up in 6 weeks for re-evaluation.

## 2019-10-27 NOTE — Patient Instructions (Signed)
Start sertraline (Zoloft) 50 mg for anxiety. Start by taking 1/2 tablet daily for about one week, then increase to 1 full tablet thereafter.Marland Kitchen  Update me regarding your FMLA request.  Please schedule a follow up visit for 6 weeks for follow up of anxiety/depression.  It was a pleasure to see you today!

## 2019-10-27 NOTE — Progress Notes (Signed)
Subjective:    Patient ID: Crystal Diaz, female    DOB: [birth year], 29 y.o.   MRN: [id number]  HPI  This visit occurred during the SARS-CoV-2 public health emergency.  Safety protocols were in place, including screening questions prior to the visit, additional usage of staff PPE, and extensive cleaning of exam room while observing appropriate contact time as indicated for disinfecting solutions.   Crystal Diaz is a 28 year old female with a history of vitamin B12 deficiency from gastric bypass, GAD, GERD who presents today to discuss anxiety.  Chronic for years, intermittent, increased over the last year since the passing of Crystal Diaz. She's completed sessions of therapy and is scheduled to see Crystal therapist soon. Symptoms include feeling nervous/anxious, panic attack recently, worry, irritability.   She has also tried medications such as Zoloft and Lexapro but didn't feel that they were effective, she only took these medications for a total of three weeks. She believes the Lexapro may have caused some harmful thoughts.   GAD 7 score of 18 and PHQ 9 score of 10 today.  She is managed on monthly B12 injections, missed September 2021. She gets anxious about giving injections to herself so she's having a nurse at work administer.   She is needing FMLA to cover Crystal position at work as Crystal son gets on the school bus at the time she has to be at work. She is also considering taking some time off to gain control of Crystal anxiety. She has not spoken to HR yet.   Review of Systems  Respiratory: Negative for shortness of breath.   Cardiovascular: Negative for chest pain.  Neurological: Negative for headaches.  Psychiatric/Behavioral: The patient is nervous/anxious.        See HPI       Past Medical History:  Diagnosis Date  . Anemia   . Anxiety   . GERD (gastroesophageal reflux disease)   . Headache   . Lumbar radiculopathy   . PCOS (polycystic ovarian syndrome)   . Plantar fasciitis,  bilateral      Social History   Socioeconomic History  . Marital status: Single    Spouse name: Not on file  . Number of children: Not on file  . Years of education: Not on file  . Highest education level: Not on file  Occupational History  . Not on file  Tobacco Use  . Smoking status: Former Smoker    Packs/day: 0.25    Years: 4.00    Pack years: 1.00    Types: Cigarettes    Quit date: 01/19/2017    Years since quitting: 2.7  . Smokeless tobacco: Never Used  Vaping Use  . Vaping Use: Never used  Substance and Sexual Activity  . Alcohol use: Yes    Alcohol/week: 0.0 standard drinks    Comment: occ  . Drug use: No  . Sexual activity: Not on file  Other Topics Concern  . Not on file  Social History Narrative   Single. In a relationship.    One son.   Works as Geographical information systems officer.    Enjoys spending time with Crystal son, traveling.   Social Determinants of Health   Financial Resource Strain:   . Difficulty of Paying Living Expenses: Not on file  Food Insecurity:   . Worried About Programme researcher, broadcasting/film/video in the Last Year: Not on file  . Ran Out of Food in the Last Year: Not on file  Transportation Needs:   .  Lack of Transportation (Medical): Not on file  . Lack of Transportation (Non-Medical): Not on file  Physical Activity:   . Days of Exercise per Week: Not on file  . Minutes of Exercise per Session: Not on file  Stress:   . Feeling of Stress : Not on file  Social Connections:   . Frequency of Communication with Friends and Family: Not on file  . Frequency of Social Gatherings with Friends and Family: Not on file  . Attends Religious Services: Not on file  . Active Member of Clubs or Organizations: Not on file  . Attends Banker Meetings: Not on file  . Marital Status: Not on file  Intimate Partner Violence:   . Fear of Current or Ex-Partner: Not on file  . Emotionally Abused: Not on file  . Physically Abused: Not on file  . Sexually Abused: Not on file     Past Surgical History:  Procedure Laterality Date  . FOOT SURGERY Right   . 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   Procedure: LAPAROSCOPIC ROUX-EN-Y GASTRIC BYPASS WITH HIATAL HERNIA REPAIR AND UPPER ENDOSCOPY;  Surgeon: Luretha Murphy, MD;  Location: [facility];  Service: General;  Laterality: N/A;  . WISDOM TOOTH EXTRACTION      Family History  Problem Relation Age of Onset  . Diabetes Diaz   . Pancreatic cancer Father 78  . Cancer Brother        Oral    No Known Allergies  Current Outpatient Medications on File Prior to Visit  Medication Sig Dispense Refill  . cyanocobalamin (,VITAMIN B-12,) 1000 MCG/ML injection Inject 1 mL (1,000 mcg total) into the muscle every 30 (thirty) days. 3 mL 1  . MedroxyPROGESTERone Acetate 150 MG/ML SUSY Inject 150 mg into the skin every 3 (three) months.   3  . pantoprazole (PROTONIX) 40 MG tablet Take 40 mg by mouth.   2  . SYRINGE/NEEDLE, DISP, 1 ML 23G X 1" 1 ML MISC Use once monthly with vitamin B12 vial. 6 each 0   No current facility-administered medications on file prior to visit.    BP 116/74   Pulse 84   Temp 98.3 F (36.8 C) (Temporal)   Ht 5\' 9"  (1.753 m)   Wt 215 lb (97.5 kg)   SpO2 97%   BMI 31.75 kg/m    Objective:   Physical Exam Cardiovascular:     Rate and Rhythm: Normal rate and regular rhythm.  Pulmonary:     Effort: Pulmonary effort is normal.     Breath sounds: Normal breath sounds.  Musculoskeletal:     Cervical back: Neck supple.  Skin:    General: Skin is warm and dry.  Psychiatric:        Mood and Affect: Mood normal.            Assessment & Plan:

## 2019-11-03 DIAGNOSIS — R319 Hematuria, unspecified: Secondary | ICD-10-CM

## 2019-11-04 NOTE — Telephone Encounter (Signed)
fmla paperwork in [place] in [employer] with pt she stated she wanted continuous leave from 01/01/20 to 01/15/20.  She also wanted intermittent leave starting 11/10/19 for appointment with anita pardo.  Her first appointment is 11/11/19.  She is thinking she might need to see anita 2 x month.

## 2019-11-04 NOTE — Telephone Encounter (Signed)
Paperwork placed on Robin's desk, complete.

## 2019-11-07 NOTE — Telephone Encounter (Signed)
Paperwork faxed °

## 2019-11-10 ENCOUNTER — Other Ambulatory Visit (INDEPENDENT_AMBULATORY_CARE_PROVIDER_SITE_OTHER): Payer: No Typology Code available for payment source

## 2019-11-10 DIAGNOSIS — R319 Hematuria, unspecified: Secondary | ICD-10-CM | POA: Diagnosis not present

## 2019-11-10 LAB — POC URINALSYSI DIPSTICK (AUTOMATED)
Blood, UA: POSITIVE
Glucose, UA: POSITIVE — AB
Ketones, UA: POSITIVE
Leukocytes, UA: NEGATIVE
Nitrite, UA: NEGATIVE
Protein, UA: POSITIVE — AB
Spec Grav, UA: >=1.03 — AB (ref 1.010–1.025)
Urobilinogen, UA: 1 E.U./dL
pH, UA: 5.5 (ref 5.0–8.0)

## 2019-11-11 ENCOUNTER — Ambulatory Visit (INDEPENDENT_AMBULATORY_CARE_PROVIDER_SITE_OTHER): Payer: No Typology Code available for payment source | Admitting: Psychology

## 2019-11-11 DIAGNOSIS — F411 Generalized anxiety disorder: Secondary | ICD-10-CM

## 2019-11-11 LAB — URINE CULTURE
MICRO NUMBER:: 11131422
Result:: NO GROWTH
SPECIMEN QUALITY:: ADEQUATE

## 2019-11-11 NOTE — Telephone Encounter (Signed)
Left message asking pt to call office See robin/alice

## 2019-11-11 NOTE — Telephone Encounter (Signed)
Hold for paperwork

## 2019-11-15 ENCOUNTER — Telehealth: Payer: Self-pay | Admitting: Primary Care

## 2019-11-15 NOTE — Telephone Encounter (Signed)
Paperwork faxed °

## 2019-11-15 NOTE — Telephone Encounter (Signed)
fmla paperwork in [employer]  In box  2 forms  [employer] wanted intermittent and continuous Leave on separate forms

## 2019-11-15 NOTE — Telephone Encounter (Signed)
Completed and placed on Robins desk. 

## 2019-11-16 NOTE — Telephone Encounter (Signed)
Copy for pt Copy for pt  sent my chart

## 2019-11-25 ENCOUNTER — Ambulatory Visit: Payer: No Typology Code available for payment source | Admitting: Psychology

## 2019-11-30 ENCOUNTER — Ambulatory Visit: Payer: No Typology Code available for payment source | Admitting: Primary Care

## 2019-12-02 ENCOUNTER — Encounter: Payer: Self-pay | Admitting: Primary Care

## 2019-12-02 ENCOUNTER — Ambulatory Visit (INDEPENDENT_AMBULATORY_CARE_PROVIDER_SITE_OTHER): Payer: No Typology Code available for payment source | Admitting: Primary Care

## 2019-12-02 ENCOUNTER — Other Ambulatory Visit: Payer: Self-pay

## 2019-12-02 VITALS — BP 116/72 | HR 97 | Temp 97.6°F | Ht 69.0 in | Wt 222.0 lb

## 2019-12-02 DIAGNOSIS — Z30013 Encounter for initial prescription of injectable contraceptive: Secondary | ICD-10-CM

## 2019-12-02 DIAGNOSIS — N939 Abnormal uterine and vaginal bleeding, unspecified: Secondary | ICD-10-CM | POA: Diagnosis not present

## 2019-12-02 LAB — POCT URINE PREGNANCY: Preg Test, Ur: NEGATIVE

## 2019-12-02 MED ORDER — MEDROXYPROGESTERONE ACETATE 150 MG/ML IM SUSP
150.0000 mg | Freq: Once | INTRAMUSCULAR | Status: AC
  Administered 2019-12-02: 150 mg via INTRAMUSCULAR

## 2019-12-02 NOTE — Patient Instructions (Addendum)
You will be contacted regarding your ultrasound.  Please let us know if you have not been contacted within two weeks.   We will be in touch regarding your swab results.  It was a pleasure to see you today!

## 2019-12-02 NOTE — Addendum Note (Signed)
Addended by: Donnamarie Poag on: 12/02/2019 09:05 AM   Modules accepted: Orders

## 2019-12-02 NOTE — Progress Notes (Signed)
Subjective:    Patient ID: Crystal Diaz, female    DOB: Nov 10, 1990, 30 y.o.   MRN: [id number]  HPI  This visit occurred during the SARS-CoV-2 public health emergency.  Safety protocols were in place, including screening questions prior to the visit, additional usage of staff PPE, and extensive cleaning of exam room while observing appropriate contact time as indicated for disinfecting solutions.   Crystal Diaz is a 29 year old female with a history of gastric bypass, vitamin B12 deficiency, GAD, GERD who presents today to discus vaginal bleeding.  She initially noticed vaginal bleeding about two months ago. She's noticed an episode of bleeding on three occasions, sometimes a few days after intercourse. The bleeding is light, has to wear a tampon at times, which will last about one week. Also with inermittent left groin pain that occurs during spotting.   She is managed on Depo Provera since the age of 43, has never bleed or had menses until a few months ago. She is overdue for her Depo Provera injection, was due 11/09/19. She typically never misses her doses. She was last sexually active a few weeks ago, unprotected intercourse.   Review of Systems  Constitutional: Negative for fever.  Gastrointestinal: Negative for abdominal pain and nausea.  Genitourinary: Positive for vaginal bleeding. Negative for dysuria, flank pain and frequency.       Left pelvic cramping       Past Medical History:  Diagnosis Date  . Anemia   . Anxiety   . GERD (gastroesophageal reflux disease)   . Headache   . Lumbar radiculopathy   . PCOS (polycystic ovarian syndrome)   . Plantar fasciitis, bilateral      Social History   Socioeconomic History  . Marital status: Single    Spouse name: Not on file  . Number of children: Not on file  . Years of education: Not on file  . Highest education level: Not on file  Occupational History  . Not on file  Tobacco Use  . Smoking status: Former Smoker     Packs/day: 0.25    Years: 4.00    Pack years: 1.00    Types: Cigarettes    Quit date: 01/19/2017    Years since quitting: 2.8  . Smokeless tobacco: Never Used  Vaping Use  . Vaping Use: Never used  Substance and Sexual Activity  . Alcohol use: Yes    Alcohol/week: 0.0 standard drinks    Comment: occ  . Drug use: No  . Sexual activity: Not on file  Other Topics Concern  . Not on file  Social History Narrative   Single. In a relationship.    One son.   Works as Geographical information systems officer.    Enjoys spending time with her son, traveling.   Social Determinants of Health   Financial Resource Strain:   . Difficulty of Paying Living Expenses: Not on file  Food Insecurity:   . Worried About Programme researcher, broadcasting/film/video in the Last Year: Not on file  . Ran Out of Food in the Last Year: Not on file  Transportation Needs:   . Lack of Transportation (Medical): Not on file  . Lack of Transportation (Non-Medical): Not on file  Physical Activity:   . Days of Exercise per Week: Not on file  . Minutes of Exercise per Session: Not on file  Stress:   . Feeling of Stress : Not on file  Social Connections:   . Frequency of Communication with Friends  and Family: Not on file  . Frequency of Social Gatherings with Friends and Family: Not on file  . Attends Religious Services: Not on file  . Active Member of Clubs or Organizations: Not on file  . Attends Banker Meetings: Not on file  . Marital Status: Not on file  Intimate Partner Violence:   . Fear of Current or Ex-Partner: Not on file  . Emotionally Abused: Not on file  . Physically Abused: Not on file  . Sexually Abused: Not on file    Past Surgical History:  Procedure Laterality Date  . FOOT SURGERY Right   . 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   Procedure: LAPAROSCOPIC ROUX-EN-Y GASTRIC BYPASS WITH HIATAL HERNIA REPAIR AND UPPER ENDOSCOPY;  Surgeon: Luretha Murphy, MD;  Location: [facility];  Service: General;  Laterality: N/A;  . WISDOM TOOTH  EXTRACTION      Family History  Problem Relation Age of Onset  . Diabetes Mother   . Pancreatic cancer Father 77  . Cancer Brother        Oral    No Known Allergies  Current Outpatient Medications on File Prior to Visit  Medication Sig Dispense Refill  . cyanocobalamin (,VITAMIN B-12,) 1000 MCG/ML injection Inject 1 mL (1,000 mcg total) into the muscle every 30 (thirty) days. 3 mL 1  . MedroxyPROGESTERone Acetate 150 MG/ML SUSY Inject 150 mg into the skin every 3 (three) months.   3  . nicotine polacrilex (NICORETTE) 2 MG gum Take by mouth.    . pantoprazole (PROTONIX) 40 MG tablet Take 40 mg by mouth.   2  . sertraline (ZOLOFT) 50 MG tablet Take 1 tablet (50 mg total) by mouth daily. For anxiety and depression. 30 tablet 1  . SYRINGE/NEEDLE, DISP, 1 ML 23G X 1" 1 ML MISC Use once monthly with vitamin B12 vial. 6 each 0  . Vitamin D, Ergocalciferol, (DRISDOL) 1.25 MG (50000 UNIT) CAPS capsule Take 1 capsule by mouth once weekly for 12 weeks. 12 capsule 1   No current facility-administered medications on file prior to visit.    BP 116/72   Pulse 97   Temp 97.6 F (36.4 C) (Temporal)   Ht 5\' 9"  (1.753 m)   Wt 222 lb (100.7 kg)   SpO2 99%   BMI 32.78 kg/m    Objective:   Physical Exam Cardiovascular:     Rate and Rhythm: Normal rate.  Pulmonary:     Effort: Pulmonary effort is normal.  Abdominal:     General: Abdomen is flat.     Palpations: Abdomen is soft.  Genitourinary:    Labia:        Right: No lesion.        Left: No lesion.      Vagina: Bleeding present. No tenderness.     Cervix: Cervical bleeding present. No discharge or erythema.     Comments: Scant amount of dark bleeding from cervix.  Musculoskeletal:     Cervical back: Neck supple.  Skin:    General: Skin is warm and dry.            Assessment & Plan:

## 2019-12-02 NOTE — Assessment & Plan Note (Signed)
Acute for the last 2 months, although did miss her most recent Depo Provera injection that was due in late October.   Given bleeding, will obtain transvaginal/pelvic ultrasound. Wet prep and STD testing completed and pending.  Await results.

## 2019-12-05 ENCOUNTER — Ambulatory Visit: Payer: No Typology Code available for payment source | Admitting: Psychology

## 2019-12-05 LAB — C. TRACHOMATIS/N. GONORRHOEAE RNA
C. trachomatis RNA, TMA: NOT DETECTED
N. gonorrhoeae RNA, TMA: NOT DETECTED

## 2019-12-05 LAB — WET PREP BY MOLECULAR PROBE
Candida species: NOT DETECTED
MICRO NUMBER:: 11226763
SPECIMEN QUALITY:: ADEQUATE
Trichomonas vaginosis: NOT DETECTED

## 2019-12-06 ENCOUNTER — Ambulatory Visit: Payer: No Typology Code available for payment source

## 2019-12-06 ENCOUNTER — Other Ambulatory Visit: Payer: Self-pay

## 2019-12-06 ENCOUNTER — Ambulatory Visit
Admission: RE | Admit: 2019-12-06 | Discharge: 2019-12-06 | Disposition: A | Discharge status: Home / Self Care | Payer: No Typology Code available for payment source | Source: Ambulatory Visit | Attending: Primary Care | Admitting: Primary Care

## 2019-12-06 DIAGNOSIS — N939 Abnormal uterine and vaginal bleeding, unspecified: Secondary | ICD-10-CM

## 2019-12-09 ENCOUNTER — Other Ambulatory Visit: Payer: Self-pay

## 2019-12-09 ENCOUNTER — Ambulatory Visit
Admission: RE | Admit: 2019-12-09 | Discharge: 2019-12-09 | Disposition: A | Discharge status: Home / Self Care | Payer: No Typology Code available for payment source | Source: Ambulatory Visit | Attending: Primary Care | Admitting: Primary Care

## 2019-12-09 DIAGNOSIS — N939 Abnormal uterine and vaginal bleeding, unspecified: Secondary | ICD-10-CM | POA: Diagnosis not present

## 2019-12-12 DIAGNOSIS — B9689 Other specified bacterial agents as the cause of diseases classified elsewhere: Secondary | ICD-10-CM

## 2019-12-14 ENCOUNTER — Other Ambulatory Visit: Payer: Self-pay | Admitting: Primary Care

## 2019-12-14 MED ORDER — METRONIDAZOLE 500 MG PO TABS: 500.0000 mg | ORAL_TABLET | Freq: Two times a day (BID) | ORAL | 0 refills | Status: DC

## 2020-01-03 ENCOUNTER — Ambulatory Visit: Payer: No Typology Code available for payment source | Admitting: Psychology

## 2020-02-06 ENCOUNTER — Other Ambulatory Visit: Payer: Self-pay | Admitting: Primary Care

## 2020-02-06 ENCOUNTER — Ambulatory Visit (INDEPENDENT_AMBULATORY_CARE_PROVIDER_SITE_OTHER): Payer: No Typology Code available for payment source | Admitting: Primary Care

## 2020-02-06 ENCOUNTER — Other Ambulatory Visit: Payer: Self-pay

## 2020-02-06 ENCOUNTER — Encounter: Payer: Self-pay | Admitting: Primary Care

## 2020-02-06 VITALS — BP 100/70 | HR 71 | Temp 97.5°F | Ht 69.0 in | Wt 224.0 lb

## 2020-02-06 DIAGNOSIS — E559 Vitamin D deficiency, unspecified: Secondary | ICD-10-CM

## 2020-02-06 DIAGNOSIS — K909 Intestinal malabsorption, unspecified: Secondary | ICD-10-CM

## 2020-02-06 DIAGNOSIS — Z9884 Bariatric surgery status: Secondary | ICD-10-CM

## 2020-02-06 DIAGNOSIS — E538 Deficiency of other specified B group vitamins: Secondary | ICD-10-CM | POA: Diagnosis not present

## 2020-02-06 DIAGNOSIS — F411 Generalized anxiety disorder: Secondary | ICD-10-CM | POA: Diagnosis not present

## 2020-02-06 DIAGNOSIS — K219 Gastro-esophageal reflux disease without esophagitis: Secondary | ICD-10-CM

## 2020-02-06 DIAGNOSIS — Z3042 Encounter for surveillance of injectable contraceptive: Secondary | ICD-10-CM

## 2020-02-06 LAB — COMPREHENSIVE METABOLIC PANEL
ALT: 12 U/L (ref 0–35)
AST: 13 U/L (ref 0–37)
Albumin: 4.2 g/dL (ref 3.5–5.2)
Alkaline Phosphatase: 56 U/L (ref 39–117)
BUN: 9 mg/dL (ref 6–23)
CO2: 26 mEq/L (ref 19–32)
Calcium: 9.5 mg/dL (ref 8.4–10.5)
Chloride: 106 mEq/L (ref 96–112)
Creatinine, Ser: 0.69 mg/dL (ref 0.40–1.20)
GFR: 115.19 mL/min (ref >60.00)
Glucose, Bld: 85 mg/dL (ref 70–99)
Potassium: 4.3 mEq/L (ref 3.5–5.1)
Sodium: 139 mEq/L (ref 135–145)
Total Bilirubin: 0.4 mg/dL (ref 0.2–1.2)
Total Protein: 6.9 g/dL (ref 6.0–8.3)

## 2020-02-06 LAB — VITAMIN D 25 HYDROXY (VIT D DEFICIENCY, FRACTURES): VITD: 15.95 ng/mL — ABNORMAL LOW (ref 30.00–100.00)

## 2020-02-06 LAB — FOLATE: Folate: 8.3 ng/mL (ref >5.9)

## 2020-02-06 LAB — CBC
HCT: 40 % (ref 36.0–46.0)
Hemoglobin: 13.3 g/dL (ref 12.0–15.0)
MCHC: 33.3 g/dL (ref 30.0–36.0)
MCV: 89.1 fl (ref 78.0–100.0)
Platelets: 285 10*3/uL (ref 150.0–400.0)
RBC: 4.49 Mil/uL (ref 3.87–5.11)
RDW: 14.5 % (ref 11.5–15.5)
WBC: 7.3 10*3/uL (ref 4.0–10.5)

## 2020-02-06 LAB — IBC + FERRITIN
Ferritin: 8.6 ng/mL — ABNORMAL LOW (ref 10.0–291.0)
Iron: 123 ug/dL (ref 42–145)
Saturation Ratios: 28.8 % (ref 20.0–50.0)
Transferrin: 305 mg/dL (ref 212.0–360.0)

## 2020-02-06 LAB — VITAMIN B12: Vitamin B-12: 279 pg/mL (ref 211–911)

## 2020-02-06 MED ORDER — PANTOPRAZOLE SODIUM 40 MG PO TBEC: 40.0000 mg | DELAYED_RELEASE_TABLET | Freq: Every day | ORAL | 3 refills | Status: DC

## 2020-02-06 MED ORDER — SERTRALINE HCL 50 MG PO TABS: 50.0000 mg | ORAL_TABLET | Freq: Every day | ORAL | 3 refills | Status: DC

## 2020-02-06 NOTE — Assessment & Plan Note (Signed)
Will update labs. Continue pantoprazole 40 mg.

## 2020-02-06 NOTE — Assessment & Plan Note (Signed)
On chronic PPI therapy, pantoprazole 40 mg, indefinitely since bypass surgery.  Refill sent to pharmacy.

## 2020-02-06 NOTE — Patient Instructions (Addendum)
Resume sertraline (Zoloft) 50 mg. Start with 1/2 tablet daily for about one week, then increase to 1 full tablet thereafter.  Please update me in about one month regarding your anxiety.  Stop by the lab prior to leaving today. I will notify you of your results once received.   You are due for your pap smear, please schedule this at your convenience.   It was a pleasure to see you today!

## 2020-02-06 NOTE — Progress Notes (Signed)
Subjective:    Patient ID: Crystal Diaz, female    DOB: 1990-10-30, 30 y.o.   MRN: [id number]  HPI  This visit occurred during the SARS-CoV-2 public health emergency.  Safety protocols were in place, including screening questions prior to the visit, additional usage of staff PPE, and extensive cleaning of exam room while observing appropriate contact time as indicated for disinfecting solutions.   Crystal Diaz is a 30 year old female with a history of vitamin B12 deficiency, gastric bypass, GAD who presents today for follow up. She is also needing a refill of her pantoprazole.   1) GAD: Currently prescribed sertraline 50 mg for which she's been taking for the past several months. Overall she's noticed improvement on Zoloft, but does have episodes of anxiety that occurs 2-3 times weekly. She ran out of her Zoloft about one week ago.    She has noticed a difference since being off of her Zoloft, had an episode at work last week. Symptoms include worrying, dwelling, feeling anxious.   2) S/P Gastric Bypass/Vitamin B12 Deficiency: Currently managed on B12 injections for which she completes monthly, but has been inconsistent over the last several months. 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 Her last injection was in late December 2021.  She is also needing a refill of her pantoprazole 40 mg for which she takes daily and is to take indefinitely after her gastric bypass surgery.  She does follow with her surgeon once annually.  3) Depo Provera: Currently managed on Depo Provera. Compliant to injections every 3 months, doing well for the most part. She does have spotting intermittently. She is due for a pap smear, does not see GYN.  Review of Systems  Respiratory: Negative for shortness of breath.   Cardiovascular: Negative for chest pain.  Psychiatric/Behavioral: The patient is nervous/anxious.        Past Medical History:  Diagnosis Date  . Anemia   .  Anxiety   . GERD (gastroesophageal reflux disease)   . Headache   . Lumbar radiculopathy   . PCOS (polycystic ovarian syndrome)   . Plantar fasciitis, bilateral      Social History   Socioeconomic History  . Marital status: Single    Spouse name: Not on file  . Number of children: Not on file  . Years of education: Not on file  . Highest education level: Not on file  Occupational History  . Not on file  Tobacco Use  . Smoking status: Former Smoker    Packs/day: 0.25    Years: 4.00    Pack years: 1.00    Types: Cigarettes    Quit date: 01/19/2017    Years since quitting: 3.0  . Smokeless tobacco: Never Used  Vaping Use  . Vaping Use: Never used  Substance and Sexual Activity  . Alcohol use: Yes    Alcohol/week: 0.0 standard drinks    Comment: occ  . Drug use: No  . Sexual activity: Not on file  Other Topics Concern  . Not on file  Social History Narrative   Single. In a relationship.    One son.   Works as Geographical information systems officer.    Enjoys spending time with her son, traveling.   Social Determinants of Health   Financial Resource Strain: Not on file  Food Insecurity: Not on file  Transportation Needs: Not on file  Physical Activity: Not on file  Stress: Not on file  Social Connections:  Not on file  Intimate Partner Violence: Not on file    Past Surgical History:  Procedure Laterality Date  . FOOT SURGERY Right   . 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   Procedure: LAPAROSCOPIC ROUX-EN-Y GASTRIC BYPASS WITH HIATAL HERNIA REPAIR AND UPPER ENDOSCOPY;  Surgeon: Luretha Murphy, MD;  Location: [facility];  Service: General;  Laterality: N/A;  . WISDOM TOOTH EXTRACTION      Family History  Problem Relation Age of Onset  . Diabetes Mother   . Pancreatic cancer Father 61  . Cancer Brother        Oral    No Known Allergies  Current Outpatient Medications on File Prior to Visit  Medication Sig Dispense Refill  . cyanocobalamin (,VITAMIN B-12,) 1000 MCG/ML injection Inject  1 mL (1,000 mcg total) into the muscle every 30 (thirty) days. 3 mL 1  . MedroxyPROGESTERone Acetate 150 MG/ML SUSY Inject 150 mg into the skin every 3 (three) months.   3  . SYRINGE/NEEDLE, DISP, 1 ML 23G X 1" 1 ML MISC Use once monthly with vitamin B12 vial. 6 each 0  . Vitamin D, Ergocalciferol, (DRISDOL) 1.25 MG (50000 UNIT) CAPS capsule Take 1 capsule by mouth once weekly for 12 weeks. 12 capsule 1   No current facility-administered medications on file prior to visit.    BP 100/70 (BP Location: Left Arm, Patient Position: Sitting, Cuff Size: Large)   Pulse 71   Temp (!) 97.5 F (36.4 C)   Ht 5\' 9"  (1.753 m)   Wt 224 lb (101.6 kg)   SpO2 95%   BMI 33.08 kg/m    Objective:   Physical Exam Constitutional:      Appearance: She is well-nourished.  Cardiovascular:     Rate and Rhythm: Normal rate and regular rhythm.  Pulmonary:     Effort: Pulmonary effort is normal.     Breath sounds: Normal breath sounds.  Musculoskeletal:     Cervical back: Neck supple.  Skin:    General: Skin is warm and dry.  Psychiatric:        Mood and Affect: Mood and affect and mood normal.            Assessment & Plan:

## 2020-02-06 NOTE — Assessment & Plan Note (Addendum)
Doing well on Depo-Provera injections.  Pap smear due, she will set up a visit for this year.  Continue Depo-Provera injections every 3 months.

## 2020-02-06 NOTE — Addendum Note (Signed)
Addended by: Eual Fines on: 02/06/2020 03:42 PM   Modules accepted: Orders

## 2020-02-06 NOTE — Assessment & Plan Note (Signed)
Improved for Zoloft 30 mg, but has been out for about a week now.  Discussed that she needs to notify me if she is ever running low and does not have a refill available.  Refill provided for Zoloft 50 mg, discussed to start with half tablet daily for a few days then increase to 1 full tablet.  We may also need to increase her dose further to 75 mg or 100 mg, she will update in about a month.

## 2020-02-06 NOTE — Assessment & Plan Note (Signed)
Inconsistent use of B12 injections monthly over the last several months, but plans on more consistency now.  Repeat B12 level pending. We will refill B12 injections once lab returns.

## 2020-02-07 ENCOUNTER — Other Ambulatory Visit: Payer: Self-pay | Admitting: Primary Care

## 2020-02-07 DIAGNOSIS — E538 Deficiency of other specified B group vitamins: Secondary | ICD-10-CM

## 2020-02-07 DIAGNOSIS — K909 Intestinal malabsorption, unspecified: Secondary | ICD-10-CM

## 2020-02-07 MED ORDER — SERTRALINE HCL 50 MG PO TABS: 50.0000 mg | ORAL_TABLET | Freq: Every day | ORAL | 3 refills | Status: DC

## 2020-02-07 MED ORDER — PANTOPRAZOLE SODIUM 40 MG PO TBEC: 40.0000 mg | DELAYED_RELEASE_TABLET | Freq: Every day | ORAL | 3 refills | Status: DC

## 2020-02-07 NOTE — Addendum Note (Signed)
Addended by: Eual Fines on: 02/07/2020 07:19 AM   Modules accepted: Orders

## 2020-04-10 ENCOUNTER — Ambulatory Visit (INDEPENDENT_AMBULATORY_CARE_PROVIDER_SITE_OTHER): Payer: No Typology Code available for payment source | Admitting: Psychology

## 2020-04-10 DIAGNOSIS — F411 Generalized anxiety disorder: Secondary | ICD-10-CM

## 2020-04-13 ENCOUNTER — Telehealth: Payer: Self-pay

## 2020-04-13 DIAGNOSIS — N939 Abnormal uterine and vaginal bleeding, unspecified: Secondary | ICD-10-CM

## 2020-04-13 NOTE — Telephone Encounter (Signed)
Crystal Diaz Pt  Pt called to report that she has noticed some dark blood again on the tip of her tampon and would like to go ahead and go to GYN... work up was done back in Nov with Trans vag [place]....   pt has [facility] insurance and needs referral to be placed for abnormal uterine bleeding... info given for [facility] given to pt so she can call to schedule appointment

## 2020-04-14 NOTE — Telephone Encounter (Signed)
Referral placed.

## 2020-04-14 NOTE — Addendum Note (Signed)
Addended by: Lorre Munroe on: 04/14/2020 08:32 AM   Modules accepted: Orders

## 2020-04-17 ENCOUNTER — Other Ambulatory Visit: Payer: Self-pay

## 2020-04-17 ENCOUNTER — Other Ambulatory Visit: Payer: Self-pay | Admitting: Primary Care

## 2020-04-17 DIAGNOSIS — F411 Generalized anxiety disorder: Secondary | ICD-10-CM

## 2020-04-17 MED ORDER — SERTRALINE HCL 50 MG PO TABS
50.0000 mg | ORAL_TABLET | Freq: Every day | ORAL | 0 refills | Status: DC
  Filled 2020-04-17: qty 90, 90d supply, fill #0

## 2020-04-18 ENCOUNTER — Other Ambulatory Visit: Payer: Self-pay

## 2020-04-23 ENCOUNTER — Ambulatory Visit (INDEPENDENT_AMBULATORY_CARE_PROVIDER_SITE_OTHER): Payer: No Typology Code available for payment source | Admitting: Psychology

## 2020-04-23 DIAGNOSIS — F411 Generalized anxiety disorder: Secondary | ICD-10-CM | POA: Diagnosis not present

## 2020-04-24 ENCOUNTER — Other Ambulatory Visit: Payer: Self-pay

## 2020-05-08 ENCOUNTER — Ambulatory Visit (INDEPENDENT_AMBULATORY_CARE_PROVIDER_SITE_OTHER): Payer: No Typology Code available for payment source | Admitting: Psychology

## 2020-05-08 DIAGNOSIS — F411 Generalized anxiety disorder: Secondary | ICD-10-CM

## 2020-05-14 ENCOUNTER — Encounter: Payer: No Typology Code available for payment source | Admitting: Family Medicine

## 2020-05-15 ENCOUNTER — Encounter: Payer: No Typology Code available for payment source | Admitting: Primary Care

## 2020-05-22 ENCOUNTER — Encounter: Payer: No Typology Code available for payment source | Admitting: Obstetrics & Gynecology

## 2020-05-23 ENCOUNTER — Ambulatory Visit (INDEPENDENT_AMBULATORY_CARE_PROVIDER_SITE_OTHER): Payer: No Typology Code available for payment source | Admitting: Psychology

## 2020-05-23 DIAGNOSIS — F411 Generalized anxiety disorder: Secondary | ICD-10-CM

## 2020-06-06 ENCOUNTER — Ambulatory Visit (INDEPENDENT_AMBULATORY_CARE_PROVIDER_SITE_OTHER): Payer: No Typology Code available for payment source | Admitting: Psychology

## 2020-06-06 DIAGNOSIS — F411 Generalized anxiety disorder: Secondary | ICD-10-CM

## 2020-06-07 ENCOUNTER — Ambulatory Visit (INDEPENDENT_AMBULATORY_CARE_PROVIDER_SITE_OTHER): Payer: No Typology Code available for payment source | Admitting: Obstetrics and Gynecology

## 2020-06-07 ENCOUNTER — Other Ambulatory Visit: Payer: Self-pay

## 2020-06-07 ENCOUNTER — Encounter: Payer: Self-pay | Admitting: Obstetrics and Gynecology

## 2020-06-07 VITALS — BP 108/71 | HR 89 | Ht 69.0 in | Wt 225.4 lb

## 2020-06-07 DIAGNOSIS — N83201 Unspecified ovarian cyst, right side: Secondary | ICD-10-CM

## 2020-06-07 DIAGNOSIS — N921 Excessive and frequent menstruation with irregular cycle: Secondary | ICD-10-CM | POA: Diagnosis not present

## 2020-06-07 NOTE — Progress Notes (Signed)
HPI:      [name] is a 30 y.o. No obstetric history on file. who LMP was No LMP recorded. Patient has had an injection.  Subjective:   She presents today because she has been bleeding off and on for the last month.  She states that she has been on Depo for more than 10 years and that over the last 2 years (after her Roux-en-Y surgery) she has had intermittent bleeding despite the Depo.  Unfortunately over the last month this bleeding has become a daily occurrence.  She does state that her bleeding seems to occur 1 to 2 days after intercourse.  Is not a large amount of bleeding but does have an odor to it. She was not aware of bone loss issues with Depo-Provera after long-term use.  She is open to other forms of birth control although absorption of OCPs with her Roux-en-Y may not be ideal. She previously tried Implanon and had it removed for irregular bleeding.  She did previously have a Mirena but reports that it was malpositioned and had to be removed. She has no concerns regarding STDs. An ultrasound in November showed a 5 cm simple ovarian cyst.  Endometrial lining was 2 mm.    Hx: The following portions of the patient's history were reviewed and updated as appropriate:             She  has a past medical history of Anemia, Anxiety, GERD (gastroesophageal reflux disease), Headache, Lumbar radiculopathy, PCOS (polycystic ovarian syndrome), and Plantar fasciitis, bilateral. She does not have any pertinent problems on file. 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 Her family history includes Cancer in her brother; Diabetes in her mother; Pancreatic cancer (age of onset: 42) in her father. She  reports that she quit smoking about 3 years ago. Her smoking use included cigarettes. She has a 1.00 pack-year smoking history. She has never used smokeless tobacco. She reports current alcohol use. She  reports that she does not use drugs. She has a current medication list which includes the following prescription(s): cyanocobalamin, medroxyprogesterone acetate, pantoprazole, sertraline, syringe/needle (disp) 1 ml, and vitamin d (ergocalciferol). She has No Known Allergies.       Review of Systems:  Review of Systems  Constitutional: Denied constitutional symptoms, night sweats, recent illness, fatigue, fever, insomnia and weight loss.  Eyes: Denied eye symptoms, eye pain, photophobia, vision change and visual disturbance.  Ears/Nose/Throat/Neck: Denied ear, nose, throat or neck symptoms, hearing loss, nasal discharge, sinus congestion and sore throat.  Cardiovascular: Denied cardiovascular symptoms, arrhythmia, chest pain/pressure, edema, exercise intolerance, orthopnea and palpitations.  Respiratory: Denied pulmonary symptoms, asthma, pleuritic pain, productive sputum, cough, dyspnea and wheezing.  Gastrointestinal: Denied, gastro-esophageal reflux, melena, nausea and vomiting.  Genitourinary: See HPI for additional information.  Musculoskeletal: Denied musculoskeletal symptoms, stiffness, swelling, muscle weakness and myalgia.  Dermatologic: Denied dermatology symptoms, rash and scar.  Neurologic: Denied neurology symptoms, dizziness, headache, neck pain and syncope.  Psychiatric: Denied psychiatric symptoms, anxiety and depression.  Endocrine: Denied endocrine symptoms including hot flashes and night sweats.   Meds:   Current Outpatient Medications on File Prior to Visit  Medication Sig Dispense Refill  . cyanocobalamin (,VITAMIN B-12,) 1000 MCG/ML injection INJECT 1 ML (1,000 MCG TOTAL) INTO THE MUSCLE EVERY 30 (THIRTY) DAYS. 3 mL 1  . MedroxyPROGESTERone Acetate 150 MG/ML SUSY Inject 150 mg into the skin every 3 (three) months.  3  . pantoprazole (PROTONIX) 40 MG tablet Take 1 tablet (40 mg total) by mouth daily. 90 tablet 3  . sertraline (ZOLOFT) 50 MG tablet Take 1 tablet (50 mg  total) by mouth daily. For anxiety and depression. 90 tablet 0  . SYRINGE/NEEDLE, DISP, 1 ML 23G X 1" 1 ML MISC Use once monthly with vitamin B12 vial. 6 each 0  . Vitamin D, Ergocalciferol, (DRISDOL) 1.25 MG (50000 UNIT) CAPS capsule TAKE 1 CAPSULE BY MOUTH ONCE WEEKLY FOR 12 WEEKS. 12 capsule 1   No current facility-administered medications on file prior to visit.       The pregnancy intention screening data noted above was reviewed. Potential methods of contraception were discussed. The patient elected to proceed with Hormonal Injection.     Objective:     Vitals:   06/07/20 1333  BP: 108/71  Pulse: 89   Filed Weights   06/07/20 1333  Weight: 225 lb 6.4 oz (102.2 kg)              Physical examination   Pelvic:   Vulva: Normal appearance.  No lesions.  Vagina: No lesions or abnormalities noted.  Support: Normal pelvic support.  Urethra No masses tenderness or scarring.  Meatus Normal size without lesions or prolapse.  Cervix: Normal appearance.  No lesions.  Anus: Normal exam.  No lesions.  Perineum: Normal exam.  No lesions.     Assessment:    No obstetric history on file. Patient Active Problem List   Diagnosis Date Noted  . Abnormal uterine bleeding 12/02/2019  . Screening for STD (sexually transmitted disease) 10/19/2018  . Encounter for birth control 03/23/2018  . GERD (gastroesophageal reflux disease) 03/23/2018  . Preventative health care 03/23/2018  . Vitamin B12 deficiency due to intestinal malabsorption 12/16/2017  . GAD (generalized anxiety disorder) 11/25/2017  . 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 Breakthrough bleeding on depo provera   2. Cyst of right ovary     No obvious cause of her bleeding at this time.  Cannot really relate it to her Roux-en-Y despite the timing.   Plan:            1.  Patient would like to consider the option of Mirena IUD instead of Depo-Provera.  I believe this will give her body a chance to recover from the Depo  and will be a different form of birth control that hopefully does not allow any breakthrough bleeding.  2.  Follow-up ultrasound ordered to check on ovarian cyst. Orders No orders of the defined types were placed in this encounter.   No orders of the defined types were placed in this encounter.     F/U  No follow-ups on file. I spent 32 minutes involved in the care of this patient preparing to see the patient by obtaining and reviewing her medical history (including labs, imaging tests and prior procedures), documenting clinical information in the electronic health record (EHR), counseling and coordinating care plans, writing and sending prescriptions, ordering tests or procedures and directly communicating with the patient by discussing pertinent items from her history and physical exam as well as detailing my assessment and plan as noted above so that she has an informed understanding.  All of her questions were answered.  Elonda Husky, M.D. 06/07/2020 2:11 PM

## 2020-06-08 ENCOUNTER — Encounter: Payer: Self-pay | Admitting: Primary Care

## 2020-06-08 ENCOUNTER — Other Ambulatory Visit: Payer: Self-pay

## 2020-06-08 ENCOUNTER — Ambulatory Visit (INDEPENDENT_AMBULATORY_CARE_PROVIDER_SITE_OTHER): Payer: No Typology Code available for payment source | Admitting: Primary Care

## 2020-06-08 VITALS — BP 108/72 | HR 79 | Temp 98.1°F | Ht 69.0 in | Wt 224.0 lb

## 2020-06-08 DIAGNOSIS — Z0000 Encounter for general adult medical examination without abnormal findings: Secondary | ICD-10-CM | POA: Diagnosis not present

## 2020-06-08 DIAGNOSIS — E538 Deficiency of other specified B group vitamins: Secondary | ICD-10-CM | POA: Diagnosis not present

## 2020-06-08 DIAGNOSIS — K909 Intestinal malabsorption, unspecified: Secondary | ICD-10-CM | POA: Diagnosis not present

## 2020-06-08 DIAGNOSIS — N939 Abnormal uterine and vaginal bleeding, unspecified: Secondary | ICD-10-CM

## 2020-06-08 DIAGNOSIS — F411 Generalized anxiety disorder: Secondary | ICD-10-CM | POA: Diagnosis not present

## 2020-06-08 DIAGNOSIS — E559 Vitamin D deficiency, unspecified: Secondary | ICD-10-CM

## 2020-06-08 DIAGNOSIS — K219 Gastro-esophageal reflux disease without esophagitis: Secondary | ICD-10-CM

## 2020-06-08 LAB — CBC
HCT: 38 % (ref 36.0–46.0)
Hemoglobin: 12.7 g/dL (ref 12.0–15.0)
MCHC: 33.5 g/dL (ref 30.0–36.0)
MCV: 87.9 fl (ref 78.0–100.0)
Platelets: 286 10*3/uL (ref 150.0–400.0)
RBC: 4.32 Mil/uL (ref 3.87–5.11)
RDW: 14.7 % (ref 11.5–15.5)
WBC: 7 10*3/uL (ref 4.0–10.5)

## 2020-06-08 LAB — COMPREHENSIVE METABOLIC PANEL
ALT: 13 U/L (ref 0–35)
AST: 16 U/L (ref 0–37)
Albumin: 3.8 g/dL (ref 3.5–5.2)
Alkaline Phosphatase: 53 U/L (ref 39–117)
BUN: 13 mg/dL (ref 6–23)
CO2: 25 mEq/L (ref 19–32)
Calcium: 8.9 mg/dL (ref 8.4–10.5)
Chloride: 107 mEq/L (ref 96–112)
Creatinine, Ser: 0.68 mg/dL (ref 0.40–1.20)
GFR: 115.32 mL/min (ref >60.00)
Glucose, Bld: 84 mg/dL (ref 70–99)
Potassium: 4.1 mEq/L (ref 3.5–5.1)
Sodium: 138 mEq/L (ref 135–145)
Total Bilirubin: 0.5 mg/dL (ref 0.2–1.2)
Total Protein: 6.7 g/dL (ref 6.0–8.3)

## 2020-06-08 LAB — LIPID PANEL
Cholesterol: 117 mg/dL (ref 0–200)
HDL: 42.6 mg/dL (ref >39.00)
LDL Cholesterol: 66 mg/dL (ref 0–99)
NonHDL: 74.44
Total CHOL/HDL Ratio: 3
Triglycerides: 41 mg/dL (ref 0.0–149.0)
VLDL: 8.2 mg/dL (ref 0.0–40.0)

## 2020-06-08 LAB — VITAMIN B12: Vitamin B-12: 289 pg/mL (ref 211–911)

## 2020-06-08 LAB — VITAMIN D 25 HYDROXY (VIT D DEFICIENCY, FRACTURES): VITD: 20.28 ng/mL — ABNORMAL LOW (ref 30.00–100.00)

## 2020-06-08 MED ORDER — VENLAFAXINE HCL ER 37.5 MG PO CP24
37.5000 mg | ORAL_CAPSULE | Freq: Every day | ORAL | 1 refills | Status: DC
  Filled 2020-06-08: qty 30, 30d supply, fill #0

## 2020-06-08 NOTE — Assessment & Plan Note (Signed)
Compliant to B12 injections monthly, repeat B12 pending today.

## 2020-06-08 NOTE — Assessment & Plan Note (Signed)
Immunizations UTD. Pap smear UTD, follows with GYN.  Discussed the importance of a healthy diet and regular exercise in order for weight loss, and to reduce the risk of any potential medical problems.  Exam today as noted. Labs pending.

## 2020-06-08 NOTE — Assessment & Plan Note (Addendum)
Chronic and continued, no improvement despite 100 mg for which she increased on her own.   Discussed options, she failed Lexapro 10 mg. Will try venlafaxine ER 37.5 mg, stop Zoloft 100 mg. Continue with therapy.   She will update in 4 weeks.

## 2020-06-08 NOTE — Assessment & Plan Note (Signed)
Doing well on pantoprazole 40 mg.  Continue same.  

## 2020-06-08 NOTE — Progress Notes (Signed)
Subjective:    Patient ID: Crystal Diaz, female    DOB: 1990-10-29, 30 y.o.   MRN: [id number]  HPI  Crystal Diaz is a very pleasant 30 y.o. female who presents today for complete physical.  She continues to experience grief from her mother, anxiety, worrying. She increased the dose of her Zoloft to 100 mg about one month ago, no improvement since then. She's tried Lexapro in the past She is seeing her therapist every 2 weeks, just began again 2 months ago.   Immunizations: -Tetanus: 2021 -Influenza: Completed this season  -Covid-19: Completed 2 vaccines  Diet: Fair diet.  Exercise: No regular exercise.  Eye exam: Completes annually  Dental exam: Completes semi-annually   Pap Smear: UTD, due in 2023. Follows with Ob/GYN  BP Readings from Last 3 Encounters:  06/08/20 108/72  06/07/20 108/71  02/06/20 100/70     Review of Systems  Constitutional: Negative for unexpected weight change.  HENT: Negative for rhinorrhea.   Respiratory: Negative for cough and shortness of breath.   Cardiovascular: Negative for chest pain.  Gastrointestinal: Negative for constipation and diarrhea.  Genitourinary: Positive for menstrual problem. Negative for difficulty urinating.  Musculoskeletal: Negative for arthralgias.  Skin: Negative for rash.  Allergic/Immunologic: Negative for environmental allergies.  Neurological: Negative for dizziness and headaches.  Psychiatric/Behavioral: The patient is nervous/anxious.        See HPI         Past Medical History:  Diagnosis Date  . Anemia   . Anxiety   . GERD (gastroesophageal reflux disease)   . Headache   . Lumbar radiculopathy   . PCOS (polycystic ovarian syndrome)   . Plantar fasciitis, bilateral     Social History   Socioeconomic History  . Marital status: Single    Spouse name: Not on file  . Number of children: Not on file  . Years of education: Not on file  . Highest education level: Not on file  Occupational  History  . Not on file  Tobacco Use  . Smoking status: Former Smoker    Packs/day: 0.25    Years: 4.00    Pack years: 1.00    Types: Cigarettes    Quit date: 01/19/2017    Years since quitting: 3.3  . Smokeless tobacco: Never Used  Vaping Use  . Vaping Use: Never used  Substance and Sexual Activity  . Alcohol use: Yes    Alcohol/week: 0.0 standard drinks    Comment: occ  . Drug use: No  . Sexual activity: Yes    Partners: Male    Birth control/protection: Injection  Other Topics Concern  . Not on file  Social History Narrative   Single. In a relationship.    One son.   Works as Geographical information systems officer.    Enjoys spending time with her son, traveling.   Social Determinants of Health   Financial Resource Strain: Not on file  Food Insecurity: Not on file  Transportation Needs: Not on file  Physical Activity: Not on file  Stress: Not on file  Social Connections: Not on file  Intimate Partner Violence: Not on file    Past Surgical History:  Procedure Laterality Date  . FOOT SURGERY Right   . 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   Procedure: LAPAROSCOPIC ROUX-EN-Y GASTRIC BYPASS WITH HIATAL HERNIA REPAIR AND UPPER ENDOSCOPY;  Surgeon: Luretha Murphy, MD;  Location: [facility];  Service: General;  Laterality: N/A;  . WISDOM TOOTH EXTRACTION      Family  History  Problem Relation Age of Onset  . Diabetes Mother   . Pancreatic cancer Father 44  . Cancer Brother        Oral    No Known Allergies  Current Outpatient Medications on File Prior to Visit  Medication Sig Dispense Refill  . cyanocobalamin (,VITAMIN B-12,) 1000 MCG/ML injection INJECT 1 ML (1,000 MCG TOTAL) INTO THE MUSCLE EVERY 30 (THIRTY) DAYS. 3 mL 1  . MedroxyPROGESTERone Acetate 150 MG/ML SUSY Inject 150 mg into the skin every 3 (three) months.   3  . pantoprazole (PROTONIX) 40 MG tablet Take 1 tablet (40 mg total) by mouth daily. 90 tablet 3  . sertraline (ZOLOFT) 50 MG tablet Take 1 tablet (50 mg total) by mouth  daily. For anxiety and depression. 90 tablet 0  . SYRINGE/NEEDLE, DISP, 1 ML 23G X 1" 1 ML MISC Use once monthly with vitamin B12 vial. 6 each 0  . Vitamin D, Ergocalciferol, (DRISDOL) 1.25 MG (50000 UNIT) CAPS capsule TAKE 1 CAPSULE BY MOUTH ONCE WEEKLY FOR 12 WEEKS. 12 capsule 1   No current facility-administered medications on file prior to visit.    There were no vitals taken for this visit. Objective:   Physical Exam HENT:     Right Ear: Tympanic membrane and ear canal normal.     Left Ear: Tympanic membrane and ear canal normal.     Nose: Nose normal.  Eyes:     Conjunctiva/sclera: Conjunctivae normal.     Pupils: Pupils are equal, round, and reactive to light.  Neck:     Thyroid: No thyromegaly.  Cardiovascular:     Rate and Rhythm: Normal rate and regular rhythm.     Heart sounds: No murmur heard.   Pulmonary:     Effort: Pulmonary effort is normal.     Breath sounds: Normal breath sounds. No rales.  Abdominal:     General: Bowel sounds are normal.     Palpations: Abdomen is soft.     Tenderness: There is no abdominal tenderness.  Musculoskeletal:        General: Normal range of motion.     Cervical back: Neck supple.  Lymphadenopathy:     Cervical: No cervical adenopathy.  Skin:    General: Skin is warm and dry.     Findings: No rash.  Neurological:     Mental Status: She is alert and oriented to person, place, and time.     Cranial Nerves: No cranial nerve deficit.     Deep Tendon Reflexes: Reflexes are normal and symmetric.  Psychiatric:        Mood and Affect: Mood normal.           Assessment & Plan:      This visit occurred during the SARS-CoV-2 public health emergency.  Safety protocols were in place, including screening questions prior to the visit, additional usage of staff PPE, and extensive cleaning of exam room while observing appropriate contact time as indicated for disinfecting solutions.

## 2020-06-08 NOTE — Assessment & Plan Note (Signed)
Following with GYN, has not had Depo Provera in over 3+ months. She is contemplating Mirena IUD.

## 2020-06-08 NOTE — Assessment & Plan Note (Signed)
Compliant to vitamin D 50,000 units weekly, continue same. Repeat level pending.

## 2020-06-08 NOTE — Patient Instructions (Signed)
Stop taking sertraline (Zoloft) for anxiety.  Start venlafaxine ER 37.5 mg for anxiety. Take 1 tablet by mouth once daily for anxiety.  Stop by the lab prior to leaving today. I will notify you of your results once received.   Please update me via my chart in 4 weeks.  It was a pleasure to see you today!   Preventive Care 77-32 Years Old, Female Preventive care refers to lifestyle choices and visits with your health care provider that can promote health and wellness. This includes:  A yearly physical exam. This is also called an annual wellness visit.  Regular dental and eye exams.  Immunizations.  Screening for certain conditions.  Healthy lifestyle choices, such as: ? Eating a healthy diet. ? Getting regular exercise. ? Not using drugs or products that contain nicotine and tobacco. ? Limiting alcohol use. What can I expect for my preventive care visit? Physical exam Your health care provider may check your:  Height and weight. These may be used to calculate your BMI (body mass index). BMI is a measurement that tells if you are at a healthy weight.  Heart rate and blood pressure.  Body temperature.  Skin for abnormal spots. Counseling Your health care provider may ask you questions about your:  Past medical problems.  Family's medical history.  Alcohol, tobacco, and drug use.  Emotional well-being.  Home life and relationship well-being.  Sexual activity.  Diet, exercise, and sleep habits.  Work and work Statistician.  Access to firearms.  Method of birth control.  Menstrual cycle.  Pregnancy history. What immunizations do I need? Vaccines are usually given at various ages, according to a schedule. Your health care provider will recommend vaccines for you based on your age, medical history, and lifestyle or other factors, such as travel or where you work.   What tests do I need? Blood tests  Lipid and cholesterol levels. These may be checked every 5  years starting at age 27.  Hepatitis C test.  Hepatitis B test. Screening  Diabetes screening. This is done by checking your blood sugar (glucose) after you have not eaten for a while (fasting).  STD (sexually transmitted disease) testing, if you are at risk.  BRCA-related cancer screening. This may be done if you have a family history of breast, ovarian, tubal, or peritoneal cancers.  Pelvic exam and Pap test. This may be done every 3 years starting at age 55. Starting at age 17, this may be done every 5 years if you have a Pap test in combination with an HPV test. Talk with your health care provider about your test results, treatment options, and if necessary, the need for more tests.   Follow these instructions at home: Eating and drinking  Eat a healthy diet that includes fresh fruits and vegetables, whole grains, lean protein, and low-fat dairy products.  Take vitamin and mineral supplements as recommended by your health care provider.  Do not drink alcohol if: ? Your health care provider tells you not to drink. ? You are pregnant, may be pregnant, or are planning to become pregnant.  If you drink alcohol: ? Limit how much you have to 0-1 drink a day. ? Be aware of how much alcohol is in your drink. In the [place] one drink equals one 12 oz bottle of beer (355 mL), one 5 oz glass of wine (148 mL), or one 1 oz glass of hard liquor (44 mL).   Lifestyle  Take daily care of your teeth  and gums. Brush your teeth every morning and night with fluoride toothpaste. Floss one time each day.  Stay active. Exercise for at least 30 minutes 5 or more days each week.  Do not use any products that contain nicotine or tobacco, such as cigarettes, e-cigarettes, and chewing tobacco. If you need help quitting, ask your health care provider.  Do not use drugs.  If you are sexually active, practice safe sex. Use a condom or other form of protection to prevent STIs (sexually transmitted  infections).  If you do not wish to become pregnant, use a form of birth control. If you plan to become pregnant, see your health care provider for a prepregnancy visit.  Find healthy ways to cope with stress, such as: ? Meditation, yoga, or listening to music. ? Journaling. ? Talking to a trusted person. ? Spending time with friends and family. Safety  Always wear your seat belt while driving or riding in a vehicle.  Do not drive: ? If you have been drinking alcohol. Do not ride with someone who has been drinking. ? When you are tired or distracted. ? While texting.  Wear a helmet and other protective equipment during sports activities.  If you have firearms in your house, make sure you follow all gun safety procedures.  Seek help if you have been physically or sexually abused. What's next?  Go to your health care provider once a year for an annual wellness visit.  Ask your health care provider how often you should have your eyes and teeth checked.  Stay up to date on all vaccines. This information is not intended to replace advice given to you by your health care provider. Make sure you discuss any questions you have with your health care provider. Document Revised: 08/28/2019 Document Reviewed: 09/10/2017 Elsevier Patient Education  2021 [employer].

## 2020-06-12 ENCOUNTER — Ambulatory Visit (HOSPITAL_COMMUNITY): Payer: No Typology Code available for payment source

## 2020-06-12 ENCOUNTER — Other Ambulatory Visit: Payer: Self-pay

## 2020-06-12 MED FILL — Ergocalciferol Cap 1.25 MG (50000 Unit): ORAL | 84 days supply | Qty: 12 | Fill #0 | Status: AC

## 2020-06-13 ENCOUNTER — Other Ambulatory Visit: Payer: Self-pay

## 2020-06-13 DIAGNOSIS — E538 Deficiency of other specified B group vitamins: Secondary | ICD-10-CM

## 2020-06-13 DIAGNOSIS — E559 Vitamin D deficiency, unspecified: Secondary | ICD-10-CM

## 2020-06-13 DIAGNOSIS — K909 Intestinal malabsorption, unspecified: Secondary | ICD-10-CM

## 2020-06-14 ENCOUNTER — Encounter: Payer: Self-pay | Admitting: Obstetrics and Gynecology

## 2020-06-14 ENCOUNTER — Other Ambulatory Visit: Payer: Self-pay

## 2020-06-14 ENCOUNTER — Ambulatory Visit (INDEPENDENT_AMBULATORY_CARE_PROVIDER_SITE_OTHER): Payer: No Typology Code available for payment source | Admitting: Obstetrics and Gynecology

## 2020-06-14 ENCOUNTER — Other Ambulatory Visit: Payer: Self-pay | Admitting: Surgical

## 2020-06-14 VITALS — BP 110/79 | HR 73 | Ht 69.0 in | Wt 226.7 lb

## 2020-06-14 DIAGNOSIS — Z30430 Encounter for insertion of intrauterine contraceptive device: Secondary | ICD-10-CM | POA: Diagnosis not present

## 2020-06-14 LAB — POCT URINE PREGNANCY: Preg Test, Ur: NEGATIVE

## 2020-06-14 MED ORDER — IBUPROFEN 800 MG PO TABS
800.0000 mg | ORAL_TABLET | Freq: Three times a day (TID) | ORAL | 0 refills | Status: DC | PRN
  Filled 2020-06-14: qty 10, 4d supply, fill #0

## 2020-06-14 NOTE — Progress Notes (Signed)
HPI:      [name] is a 30 y.o. No obstetric history on file. who LMP was No LMP recorded. Patient has had an injection.  Subjective:   She presents today for IUD insertion.  She is getting the IUD for both birth control and cycle control.  She has had spotting/bleeding on Depo-Provera. She states that she is a very anxious person and does not do procedures well.    Hx: The following portions of the patient's history were reviewed and updated as appropriate:             She  has a past medical history of Anemia, Anxiety, GERD (gastroesophageal reflux disease), Headache, Lumbar radiculopathy, PCOS (polycystic ovarian syndrome), and Plantar fasciitis, bilateral. She does not have any pertinent problems on file. 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 Her family history includes Cancer in her brother; Diabetes in her mother; Pancreatic cancer (age of onset: 57) in her father. She  reports that she quit smoking about 3 years ago. Her smoking use included cigarettes. She has a 1.00 pack-year smoking history. She has never used smokeless tobacco. She reports current alcohol use. She reports that she does not use drugs. She has a current medication list which includes the following prescription(s): cyanocobalamin, nystatin, pantoprazole, syringe/needle (disp) 1 ml, venlafaxine xr, and vitamin d (ergocalciferol). She has No Known Allergies.       Review of Systems:  Review of Systems  Constitutional: Denied constitutional symptoms, night sweats, recent illness, fatigue, fever, insomnia and weight loss.  Eyes: Denied eye symptoms, eye pain, photophobia, vision change and visual disturbance.  Ears/Nose/Throat/Neck: Denied ear, nose, throat or neck symptoms, hearing loss, nasal discharge, sinus congestion and sore throat.  Cardiovascular: Denied cardiovascular symptoms, arrhythmia, chest  pain/pressure, edema, exercise intolerance, orthopnea and palpitations.  Respiratory: Denied pulmonary symptoms, asthma, pleuritic pain, productive sputum, cough, dyspnea and wheezing.  Gastrointestinal: Denied, gastro-esophageal reflux, melena, nausea and vomiting.  Genitourinary: Denied genitourinary symptoms including symptomatic vaginal discharge, pelvic relaxation issues, and urinary complaints.  Musculoskeletal: Denied musculoskeletal symptoms, stiffness, swelling, muscle weakness and myalgia.  Dermatologic: Denied dermatology symptoms, rash and scar.  Neurologic: Denied neurology symptoms, dizziness, headache, neck pain and syncope.  Psychiatric: Denied psychiatric symptoms, anxiety and depression.  Endocrine: Denied endocrine symptoms including hot flashes and night sweats.   Meds:   Current Outpatient Medications on File Prior to Visit  Medication Sig Dispense Refill  . cyanocobalamin (,VITAMIN B-12,) 1000 MCG/ML injection INJECT 1 ML (1,000 MCG TOTAL) INTO THE MUSCLE EVERY 30 (THIRTY) DAYS. 3 mL 1  . nystatin (MYCOSTATIN/NYSTOP) powder SMARTSIG:1 Application Topical 2-3 Times Daily    . pantoprazole (PROTONIX) 40 MG tablet Take 1 tablet (40 mg total) by mouth daily. 90 tablet 3  . SYRINGE/NEEDLE, DISP, 1 ML 23G X 1" 1 ML MISC Use once monthly with vitamin B12 vial. 6 each 0  . venlafaxine XR (EFFEXOR XR) 37.5 MG 24 hr capsule Take 1 capsule (37.5 mg total) by mouth daily with breakfast for anxiety 30 capsule 1  . Vitamin D, Ergocalciferol, (DRISDOL) 1.25 MG (50000 UNIT) CAPS capsule TAKE 1 CAPSULE BY MOUTH ONCE WEEKLY FOR 12 WEEKS. 12 capsule 1   No current facility-administered medications on file prior to visit.    Objective:     Vitals:   06/14/20 1003  BP: 110/79  Pulse: 73    Physical examination   Pelvic:   Vulva:  Normal appearance.  No lesions.  Vagina: No lesions or abnormalities noted.  Support: Normal pelvic support.  Urethra No masses tenderness or scarring.   Meatus Normal size without lesions or prolapse.  Cervix: Normal appearance.  No lesions.  Anus: Normal exam.  No lesions.  Perineum: Normal exam.  No lesions.        Bimanual   Uterus: Normal size.  Non-tender.  Mobile.  AV.  Adnexae: No masses.  Non-tender to palpation.  Cul-de-sac: Negative for abnormality.   IUD Procedure Pt has read the booklet and signed the appropriate forms regarding the Mirena IUD.  All of her questions have been answered.   The cervix was cleansed with betadine solution.  After sounding the uterus and noting the position, the IUD was placed in the usual manner without problem.  The string was cut to the appropriate length.  It is very difficult for her to remain calm and hold still during the procedure.  Despite this I believe that the IUD was correctly placed.  Immediately after the procedure she felt pelvic pressure over the course of 5 to 10 minutes was able to calm down and her pelvic pressure decreased some.       NDC # = [id number]       Assessment:    No obstetric history on file. Patient Active Problem List   Diagnosis Date Noted  . Vitamin D deficiency 06/08/2020  . Abnormal uterine bleeding 12/02/2019  . Screening for STD (sexually transmitted disease) 10/19/2018  . Encounter for birth control 03/23/2018  . GERD (gastroesophageal reflux disease) 03/23/2018  . Preventative health care 03/23/2018  . Vitamin B12 deficiency due to intestinal malabsorption 12/16/2017  . GAD (generalized anxiety disorder) 11/25/2017  . 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 Encounter for insertion of mirena IUD       Plan:             F/U  Return in about 4 weeks (around 07/12/2020) for For IUD f/u.   She is to contact us if she continues to experience pelvic pain or other unusual pelvic symptoms.  Elonda Husky, M.D. 06/14/2020 10:39 AM

## 2020-06-14 NOTE — Progress Notes (Signed)
Spoke with patient and she is still having bad cramping. She spoke with her surgeon from the gastric by pass. They told her that she can do a few days of the Ibuprofen. She is unable to fine it in the [facility] liquid. Per Dr. Valentino Saxon I have sent in 10 Ibuprofen 800 mg.

## 2020-06-15 ENCOUNTER — Ambulatory Visit (HOSPITAL_COMMUNITY)
Admission: RE | Admit: 2020-06-15 | Discharge: 2020-06-15 | Disposition: A | Discharge status: Home / Self Care | Payer: No Typology Code available for payment source | Source: Ambulatory Visit | Attending: Obstetrics and Gynecology | Admitting: Obstetrics and Gynecology

## 2020-06-15 ENCOUNTER — Other Ambulatory Visit: Payer: Self-pay

## 2020-06-15 DIAGNOSIS — N83201 Unspecified ovarian cyst, right side: Secondary | ICD-10-CM | POA: Diagnosis present

## 2020-06-15 MED ORDER — "SYRINGE/NEEDLE (DISP) 23G X 1"" 1 ML MISC"
0 refills | Status: DC
  Filled 2020-06-15: qty 12, fill #0

## 2020-06-15 MED ORDER — CYANOCOBALAMIN 1000 MCG/ML IJ SOLN
INTRAMUSCULAR | 1 refills | Status: AC
Start: 2020-06-15 — End: 2021-06-15
  Filled 2020-06-15: qty 3, 90d supply, fill #0

## 2020-06-15 MED ORDER — VITAMIN D (ERGOCALCIFEROL) 1.25 MG (50000 UNIT) PO CAPS
50000.0000 [IU] | ORAL_CAPSULE | ORAL | 1 refills | Status: AC
  Filled 2020-06-15: qty 12, 84d supply, fill #0

## 2020-06-20 ENCOUNTER — Ambulatory Visit (INDEPENDENT_AMBULATORY_CARE_PROVIDER_SITE_OTHER): Payer: No Typology Code available for payment source | Admitting: Psychology

## 2020-06-20 DIAGNOSIS — F411 Generalized anxiety disorder: Secondary | ICD-10-CM

## 2020-06-26 ENCOUNTER — Other Ambulatory Visit: Payer: Self-pay

## 2020-07-04 ENCOUNTER — Ambulatory Visit: Payer: No Typology Code available for payment source | Admitting: Psychology

## 2020-07-12 ENCOUNTER — Telehealth: Payer: Self-pay | Admitting: Primary Care

## 2020-07-12 ENCOUNTER — Encounter: Payer: Self-pay | Admitting: Obstetrics and Gynecology

## 2020-07-12 ENCOUNTER — Other Ambulatory Visit: Payer: Self-pay

## 2020-07-12 ENCOUNTER — Ambulatory Visit (INDEPENDENT_AMBULATORY_CARE_PROVIDER_SITE_OTHER): Payer: No Typology Code available for payment source | Admitting: Obstetrics and Gynecology

## 2020-07-12 VITALS — BP 110/71 | HR 84 | Ht 69.0 in | Wt 225.0 lb

## 2020-07-12 DIAGNOSIS — N83201 Unspecified ovarian cyst, right side: Secondary | ICD-10-CM

## 2020-07-12 DIAGNOSIS — Z30431 Encounter for routine checking of intrauterine contraceptive device: Secondary | ICD-10-CM | POA: Diagnosis not present

## 2020-07-12 NOTE — Telephone Encounter (Signed)
Noted, will send to [facility] as FYI.

## 2020-07-12 NOTE — Telephone Encounter (Signed)
Paperwork amended and placed in PCP's inbox for review, sign and date  Pt requests one year extension

## 2020-07-12 NOTE — Progress Notes (Signed)
HPI:      [name] is a 30 y.o. No obstetric history on file. who LMP was No LMP recorded. Patient has had an injection.  Subjective:   She presents today for follow-up of her IUD.  She reports that she still has some spotting from the IUD but is generally getting better.  She had some pelvic cramping type pain but this also seems to be resolving.  She did undergo a pelvic ultrasound and her right ovarian cyst is now gone.  The IUD was located correctly on ultrasound.    Hx: The following portions of the patient's history were reviewed and updated as appropriate:             She  has a past medical history of Anemia, Anxiety, GERD (gastroesophageal reflux disease), Headache, Lumbar radiculopathy, PCOS (polycystic ovarian syndrome), and Plantar fasciitis, bilateral. She does not have any pertinent problems on file. 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 Her family history includes Cancer in her brother; Diabetes in her mother; Pancreatic cancer (age of onset: 69) in her father. She  reports that she quit smoking about 3 years ago. Her smoking use included cigarettes. She has a 1.00 pack-year smoking history. She has never used smokeless tobacco. She reports current alcohol use. She reports that she does not use drugs. She has a current medication list which includes the following prescription(s): cyanocobalamin, ibuprofen, nystatin, pantoprazole, syringe/needle (disp) 1 ml, venlafaxine xr, and vitamin d (ergocalciferol). She has No Known Allergies.       Review of Systems:  Review of Systems  Constitutional: Denied constitutional symptoms, night sweats, recent illness, fatigue, fever, insomnia and weight loss.  Eyes: Denied eye symptoms, eye pain, photophobia, vision change and visual disturbance.  Ears/Nose/Throat/Neck: Denied ear, nose, throat or neck symptoms, hearing loss, nasal  discharge, sinus congestion and sore throat.  Cardiovascular: Denied cardiovascular symptoms, arrhythmia, chest pain/pressure, edema, exercise intolerance, orthopnea and palpitations.  Respiratory: Denied pulmonary symptoms, asthma, pleuritic pain, productive sputum, cough, dyspnea and wheezing.  Gastrointestinal: Denied, gastro-esophageal reflux, melena, nausea and vomiting.  Genitourinary: Denied genitourinary symptoms including symptomatic vaginal discharge, pelvic relaxation issues, and urinary complaints.  Musculoskeletal: Denied musculoskeletal symptoms, stiffness, swelling, muscle weakness and myalgia.  Dermatologic: Denied dermatology symptoms, rash and scar.  Neurologic: Denied neurology symptoms, dizziness, headache, neck pain and syncope.  Psychiatric: Denied psychiatric symptoms, anxiety and depression.  Endocrine: Denied endocrine symptoms including hot flashes and night sweats.   Meds:   Current Outpatient Medications on File Prior to Visit  Medication Sig Dispense Refill   cyanocobalamin (,VITAMIN B-12,) 1000 MCG/ML injection INJECT 1 ML (1,000 MCG TOTAL) INTO THE MUSCLE EVERY 30 (THIRTY) DAYS. 3 mL 1   ibuprofen (ADVIL) 800 MG tablet Take 1 tablet (800 mg total) by mouth every 8 (eight) hours as needed. 10 tablet 0   nystatin (MYCOSTATIN/NYSTOP) powder SMARTSIG:1 Application Topical 2-3 Times Daily     pantoprazole (PROTONIX) 40 MG tablet Take 1 tablet (40 mg total) by mouth daily. 90 tablet 3   SYRINGE/NEEDLE, DISP, 1 ML 23G X 1" 1 ML MISC Use once monthly with vitamin B12 vial. 12 each 0   venlafaxine XR (EFFEXOR XR) 37.5 MG 24 hr capsule Take 1 capsule (37.5 mg total) by mouth daily with breakfast for anxiety 30 capsule 1   Vitamin D, Ergocalciferol, (DRISDOL) 1.25 MG (50000 UNIT) CAPS capsule Take 1 capsule (50,000 Units total) by  mouth once a week. 12 capsule 1   No current facility-administered medications on file prior to visit.        The pregnancy intention  screening data noted above was reviewed. Potential methods of contraception were discussed. The patient elected to proceed with IUD or IUS.     Objective:     Vitals:   07/12/20 1303  BP: 110/71  Pulse: 84   Filed Weights   07/12/20 1303  Weight: 225 lb (102.1 kg)              Physical examination   Pelvic:   Vulva: Normal appearance.  No lesions.  Vagina: No lesions or abnormalities noted.  Support: Normal pelvic support.  Urethra No masses tenderness or scarring.  Meatus Normal size without lesions or prolapse.  Cervix: Normal appearance.  No lesions. IUD strings noted at cervical os.  Anus: Normal exam.  No lesions.  Perineum: Normal exam.  No lesions.        Bimanual   Uterus: Normal size.  Non-tender.  Mobile.  AV.  Adnexae: No masses.  Non-tender to palpation.  Cul-de-sac: Negative for abnormality.     Assessment:    No obstetric history on file. 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 Surveillance of previously prescribed intrauterine contraceptive device   2. Cyst of right ovary     Cyst of ovary resolved.  Patient doing well with IUD.   Plan:            1.  Expect that spotting will resolve and cramping continue to improve.  2.  Follow-up for annual examination. Orders No orders of the defined types were placed in this encounter.   No orders of the defined types were placed in this encounter.     F/U  Return for Annual Physical. I spent 21 minutes involved in the care of this patient preparing to see the patient by  obtaining and reviewing her medical history (including labs, imaging tests and prior procedures), documenting clinical information in the electronic health record (EHR), counseling and coordinating care plans, writing and sending prescriptions, ordering tests or procedures and directly communicating with the patient by discussing pertinent items from her history and physical exam as well as detailing my assessment and plan as noted above so that she has an informed understanding.  All of her questions were answered.  Elonda Husky, M.D. 07/12/2020 1:28 PM

## 2020-07-12 NOTE — Telephone Encounter (Signed)
Crystal Diaz called in and wanted to tell [name] that she is seeing the behavorial dr and her obgyn. And the FMLA paperwork has expired and she should be receiving soon and she stated that she is seeing the therapist.

## 2020-07-12 NOTE — Telephone Encounter (Signed)
Agreed to extend for 6 months, MyChart message sent to patient.  Folder handed to [place].

## 2020-07-18 NOTE — Telephone Encounter (Signed)
Called pt to inform paperwork was completed and faxed  Copy mailed to pt  Copy for scan  Copy retained by me

## 2020-08-13 ENCOUNTER — Other Ambulatory Visit: Payer: Self-pay

## 2020-08-13 MED ORDER — CARESTART COVID-19 HOME TEST VI KIT
PACK | 0 refills | Status: DC
  Filled 2020-08-13: qty 2, 4d supply, fill #0

## 2020-08-22 DIAGNOSIS — R102 Pelvic and perineal pain: Secondary | ICD-10-CM

## 2020-08-27 ENCOUNTER — Other Ambulatory Visit: Payer: Self-pay

## 2020-09-19 ENCOUNTER — Other Ambulatory Visit: Payer: Self-pay

## 2020-09-19 ENCOUNTER — Telehealth: Payer: Self-pay | Admitting: Obstetrics and Gynecology

## 2020-09-19 ENCOUNTER — Encounter: Payer: Self-pay | Admitting: Primary Care

## 2020-09-19 ENCOUNTER — Ambulatory Visit (INDEPENDENT_AMBULATORY_CARE_PROVIDER_SITE_OTHER): Payer: No Typology Code available for payment source | Admitting: Primary Care

## 2020-09-19 DIAGNOSIS — N939 Abnormal uterine and vaginal bleeding, unspecified: Secondary | ICD-10-CM | POA: Diagnosis not present

## 2020-09-19 DIAGNOSIS — F411 Generalized anxiety disorder: Secondary | ICD-10-CM | POA: Diagnosis not present

## 2020-09-19 MED ORDER — VENLAFAXINE HCL ER 37.5 MG PO CP24
37.5000 mg | ORAL_CAPSULE | Freq: Every day | ORAL | 1 refills | Status: DC
  Filled 2020-09-19: qty 90, 90d supply, fill #0

## 2020-09-19 NOTE — Telephone Encounter (Signed)
Patient states that she is in severe pain.  Very sharpe pain.  She is scheduled for an [place] on 09/21/20 and states that she really doesn't have the money to go.  At her last [place] she had to pay 400 out of pocket.  She stated that the pain is unbearable and has not been able to work full days.

## 2020-09-19 NOTE — Assessment & Plan Note (Signed)
Continued since IUD placed several months ago. She will set up a visit with her GYN.

## 2020-09-19 NOTE — Assessment & Plan Note (Signed)
Deteriorated, also inconsistently meeting with therapy. It seems like she did notice improvement on venlafaxine ER 37.5 mg but only took for two months.  Refills provided for venlafaxine ER 37.5 mg, she will update. She will also connect with therapy to set up with a new therapist as her prior one has moved out of state.

## 2020-09-19 NOTE — Progress Notes (Signed)
Subjective:    Patient ID: Crystal Diaz, female    DOB: 1990/04/03, 30 y.o.   MRN: [id number]  HPI  Crystal Diaz is a very pleasant 30 y.o. female with a history of anxiety, gastric bypass, vitamin B12 deficiency who presents today to discuss anxiety.   Currently prescribed venlafaxine ER 37.5 mg which was prescribed in May 2022 for ongoing anxiety symptoms and grief from the passing of her mother. She didn't notice improvement on Zoloft 100 mg and has failed Lexapro in the past. Had been involved in therapy.  Today she endorses she took the venlafaxine for two months which helped some overall anxiety but not situational anxiety. She continues to grieve the loss of her mother, has been tearful at work, breaking down in front of others, doesn't feel support from her family, recent breakup with her boyfriend, not wanting to talk with others, not feeling herself. She is also now in school full time for her associates in arts and feels discouraged as she is in class with younger people.   She's also frustrated with her intermittent vaginal bleeding, has been bleeding for three weeks at a time for the last four months. She does follow with GYN, has IUD in place and bleeding started during placement. She has noticed vaginal discharge and irritation. Plans on scheduling an appointment with GYN.   She has met with her therapist infrequently, last visit was two months ago. Unfortunately her therapist has moved out of state.    Review of Systems  Genitourinary:  Positive for pelvic pain and vaginal bleeding. Negative for vaginal discharge.  Psychiatric/Behavioral:  The patient is nervous/anxious.        See HPI        Past Medical History:  Diagnosis Date   Anemia    Anxiety    GERD (gastroesophageal reflux disease)    Headache    Lumbar radiculopathy    PCOS (polycystic ovarian syndrome)    Plantar fasciitis, bilateral     Social History   Socioeconomic History   Marital status:  Single    Spouse name: Not on file   Number of children: Not on file   Years of education: Not on file   Highest education level: Not on file  Occupational History   Not on file  Tobacco Use   Smoking status: Former    Packs/day: 0.25    Years: 4.00    Pack years: 1.00    Types: Cigarettes    Quit date: 01/19/2017    Years since quitting: 3.6   Smokeless tobacco: Never  Vaping Use   Vaping Use: Never used  Substance and Sexual Activity   Alcohol use: Yes    Alcohol/week: 0.0 standard drinks    Comment: occ   Drug use: No   Sexual activity: Yes    Partners: Male    Birth control/protection: Injection  Other Topics Concern   Not on file  Social History Narrative   Single. In a relationship.    One son.   Works as Biomedical engineer.    Enjoys spending time with her son, traveling.   Social Determinants of Health   Financial Resource Strain: Not on file  Food Insecurity: Not on file  Transportation Needs: Not on file  Physical Activity: Not on file  Stress: Not on file  Social Connections: Not on file  Intimate Partner Violence: Not on file    Past Surgical History:  Procedure Laterality Date   FOOT SURGERY  Right    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   Procedure: LAPAROSCOPIC ROUX-EN-Y GASTRIC BYPASS WITH HIATAL HERNIA REPAIR AND UPPER ENDOSCOPY;  Surgeon: Johnathan Hausen, MD;  Location: [facility];  Service: General;  Laterality: N/A;   WISDOM TOOTH EXTRACTION      Family History  Problem Relation Age of Onset   Diabetes Mother    Pancreatic cancer Father 11   Cancer Brother        Oral    No Known Allergies  Current Outpatient Medications on File Prior to Visit  Medication Sig Dispense Refill   cyanocobalamin (,VITAMIN B-12,) 1000 MCG/ML injection INJECT 1 ML (1,000 MCG TOTAL) INTO THE MUSCLE EVERY 30 (THIRTY) DAYS. 3 mL 1   ibuprofen (ADVIL) 800 MG tablet Take 1 tablet (800 mg total) by mouth every 8 (eight) hours as needed. 10 tablet 0   nystatin  (MYCOSTATIN/NYSTOP) powder SMARTSIG:1 Application Topical 2-3 Times Daily     pantoprazole (PROTONIX) 40 MG tablet Take 1 tablet (40 mg total) by mouth daily. 90 tablet 3   SYRINGE/NEEDLE, DISP, 1 ML 23G X 1" 1 ML MISC Use once monthly with vitamin B12 vial. 12 each 0   Vitamin D, Ergocalciferol, (DRISDOL) 1.25 MG (50000 UNIT) CAPS capsule Take 1 capsule (50,000 Units total) by mouth once a week. 12 capsule 1   No current facility-administered medications on file prior to visit.    BP 110/78   Pulse 78   Temp 98.6 F (37 C) (Temporal)   Ht '5\' 9"'  (1.753 m)   Wt 219 lb (99.3 kg)   SpO2 99%   BMI 32.34 kg/m  Objective:   Physical Exam Cardiovascular:     Rate and Rhythm: Normal rate and regular rhythm.  Pulmonary:     Effort: Pulmonary effort is normal.     Breath sounds: Normal breath sounds.  Musculoskeletal:     Cervical back: Neck supple.  Skin:    General: Skin is warm and dry.  Psychiatric:        Mood and Affect: Mood normal.     Comments: Tearful during visit           Assessment & Plan:      This visit occurred during the SARS-CoV-2 public health emergency.  Safety protocols were in place, including screening questions prior to the visit, additional usage of staff PPE, and extensive cleaning of exam room while observing appropriate contact time as indicated for disinfecting solutions.

## 2020-09-19 NOTE — Telephone Encounter (Signed)
Patient states she does not want to pay for [place] and feels like [place] wont help. Pt says she's having severe consistent pain L side 'ovary pain', sharp pain with throbbing. Patient states if pain continues, she will go to ED but will try to wait til this Friday 9/9 to consult with physician.

## 2020-09-19 NOTE — Patient Instructions (Signed)
Resume venlafaxine ER 37.5 mg daily for anxiety.   Please set up with a new therapist.  Please update me in 4-6 weeks.  It was a pleasure to see you today!

## 2020-09-21 ENCOUNTER — Ambulatory Visit: Payer: No Typology Code available for payment source

## 2020-10-03 ENCOUNTER — Other Ambulatory Visit: Payer: Self-pay

## 2020-10-03 ENCOUNTER — Encounter: Payer: Self-pay | Admitting: Obstetrics and Gynecology

## 2020-10-03 ENCOUNTER — Ambulatory Visit (INDEPENDENT_AMBULATORY_CARE_PROVIDER_SITE_OTHER): Payer: No Typology Code available for payment source | Admitting: Obstetrics and Gynecology

## 2020-10-03 VITALS — BP 99/66 | HR 89 | Ht 69.0 in | Wt 229.8 lb

## 2020-10-03 DIAGNOSIS — Z30432 Encounter for removal of intrauterine contraceptive device: Secondary | ICD-10-CM | POA: Diagnosis not present

## 2020-10-03 DIAGNOSIS — Z975 Presence of (intrauterine) contraceptive device: Secondary | ICD-10-CM | POA: Diagnosis not present

## 2020-10-03 DIAGNOSIS — N921 Excessive and frequent menstruation with irregular cycle: Secondary | ICD-10-CM | POA: Diagnosis not present

## 2020-10-03 DIAGNOSIS — Z30011 Encounter for initial prescription of contraceptive pills: Secondary | ICD-10-CM

## 2020-10-03 NOTE — Progress Notes (Signed)
HPI:      [name] is a 30 y.o. G1P1001 who LMP was No LMP recorded. (Menstrual status: IUD).  Subjective:   She presents today requesting IUD removal.  She has had irregular bleeding on and off during the entire time she has her IUD.  It has become especially a problem in the last month where she has bled 3 out of 4 weeks.  She is not having significant pelvic cramping, pain with intercourse or other pelvic issues but the bleeding is currently unmanageable.  Of significant note patient had irregular bleeding while on Depo-Provera as well.  She has been using her IUD for both birth control and cycle control. She would like to try OCPs for birth control and cycle control.  She has taken them in the past but not for a "long time."    Hx: The following portions of the patient's history were reviewed and updated as appropriate:             She  has a past medical history of Anemia, Anxiety, GERD (gastroesophageal reflux disease), Headache, Lumbar radiculopathy, PCOS (polycystic ovarian syndrome), and Plantar fasciitis, bilateral. She does not have any pertinent problems on file. 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 Her family history includes Cancer in her brother; Diabetes in her mother; Pancreatic cancer (age of onset: 41) in her father. She  reports that she quit smoking about 3 years ago. Her smoking use included cigarettes. She has a 1.00 pack-year smoking history. She has never used smokeless tobacco. She reports current alcohol use. She reports that she does not use drugs. She has a current medication list which includes the following prescription(s): cyanocobalamin, levonorgestrel, pantoprazole, syringe/needle (disp) 1 ml, venlafaxine xr, and vitamin d (ergocalciferol). She has No Known Allergies.       Review of Systems:  Review of Systems  Constitutional: Denied constitutional  symptoms, night sweats, recent illness, fatigue, fever, insomnia and weight loss.  Eyes: Denied eye symptoms, eye pain, photophobia, vision change and visual disturbance.  Ears/Nose/Throat/Neck: Denied ear, nose, throat or neck symptoms, hearing loss, nasal discharge, sinus congestion and sore throat.  Cardiovascular: Denied cardiovascular symptoms, arrhythmia, chest pain/pressure, edema, exercise intolerance, orthopnea and palpitations.  Respiratory: Denied pulmonary symptoms, asthma, pleuritic pain, productive sputum, cough, dyspnea and wheezing.  Gastrointestinal: Denied, gastro-esophageal reflux, melena, nausea and vomiting.  Genitourinary: See HPI for additional information.  Musculoskeletal: Denied musculoskeletal symptoms, stiffness, swelling, muscle weakness and myalgia.  Dermatologic: Denied dermatology symptoms, rash and scar.  Neurologic: Denied neurology symptoms, dizziness, headache, neck pain and syncope.  Psychiatric: Denied psychiatric symptoms, anxiety and depression.  Endocrine: Denied endocrine symptoms including hot flashes and night sweats.   Meds:   Current Outpatient Medications on File Prior to Visit  Medication Sig Dispense Refill   cyanocobalamin (,VITAMIN B-12,) 1000 MCG/ML injection INJECT 1 ML (1,000 MCG TOTAL) INTO THE MUSCLE EVERY 30 (THIRTY) DAYS. 3 mL 1   levonorgestrel (MIRENA) 20 MCG/DAY IUD 1 each by Intrauterine route once.     pantoprazole (PROTONIX) 40 MG tablet Take 1 tablet (40 mg total) by mouth daily. 90 tablet 3   SYRINGE/NEEDLE, DISP, 1 ML 23G X 1" 1 ML MISC Use once monthly with vitamin B12 vial. 12 each 0   venlafaxine XR (EFFEXOR XR) 37.5 MG 24 hr capsule Take 1 capsule (37.5 mg total) by mouth daily with breakfast for anxiety 90 capsule 1  Vitamin D, Ergocalciferol, (DRISDOL) 1.25 MG (50000 UNIT) CAPS capsule Take 1 capsule (50,000 Units total) by mouth once a week. 12 capsule 1   No current facility-administered medications on file prior to  visit.    Upstream - 10/03/20 1612       Contraception Wrap Up   Current Method IUD or IUS    End Method Oral Contraceptive    Contraception Counseling Provided Yes            The pregnancy intention screening data noted above was reviewed. Potential methods of contraception were discussed. The patient elected to proceed with Oral Contraceptive.    Objective:     Vitals:   10/03/20 1535  BP: 99/66  Pulse: 89   Filed Weights   10/03/20 1535  Weight: 229 lb 12.8 oz (104.2 kg)              Physical examination   Pelvic:   Vulva: Normal appearance.  No lesions.  Vagina: No lesions or abnormalities noted.  Support: Normal pelvic support.  Urethra No masses tenderness or scarring.  Meatus Normal size without lesions or prolapse.  Cervix: Normal appearance.  No lesions. IUD strings noted at cervical os.  Anus: Normal exam.  No lesions.  Perineum: Normal exam.  No lesions.        Bimanual   Uterus: Normal size.  Non-tender.  Mobile.  AV.  Adnexae: No masses.  Non-tender to palpation.  Cul-de-sac: Negative for abnormality.   IUD Removal Strings of IUD identified and grasped.  IUD removed without problem.  Pt tolerated this well.  IUD noted to be intact.            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 Breakthrough bleeding with IUD   2. Encounter for IUD removal   3. Initiation of OCP (BCP)        Plan:            1.  Discussed other forms of birth control patient has decided upon OCPs.  OCPs The  risks /benefits of OCPs have been explained to the patient in detail.  Product literature has been given to her where appropriate.  I have instructed her in the use of OCPs.  I have explained to the patient that OCPs are not as effective for birth control during the first month of use, and that another form of contraception should be used during this time.  Both first-day start and Sunday start have been explained.  The risks and benefits of each was discussed.  She has been made aware of  the fact that in rare circumstances, other medications may affect the efficacy of OCPs.  I have answered all of her questions, and I believe that she has an understanding of the effectiveness and use of OCPs. Will start Mircette.  Orders No orders of the defined types were placed in this encounter.   No orders of the defined types were placed in this encounter.     F/U  Return in about 4 months (around 02/02/2021).  I spent 22 minutes involved in the care of this patient preparing to see the patient by obtaining and reviewing her medical history (including labs, imaging tests and prior procedures), documenting clinical information in the electronic health record (EHR), counseling and coordinating care plans, writing and sending prescriptions, ordering tests or procedures and in direct communicating with the patient and medical staff discussing pertinent items from her history and physical exam.  Elonda Husky, M.D. 10/03/2020 4:11 PM

## 2020-10-03 NOTE — Progress Notes (Signed)
Pt present due to having prolonged bleeding; cycle lasting 3 weeks(brown, dark blood with clots), vaginal irritation, and possible want the IUD removed.

## 2020-10-05 ENCOUNTER — Telehealth: Payer: Self-pay | Admitting: Obstetrics and Gynecology

## 2020-10-05 ENCOUNTER — Other Ambulatory Visit: Payer: Self-pay

## 2020-10-05 MED ORDER — DESOGESTREL-ETHINYL ESTRADIOL 0.15-0.02/0.01 MG (21/5) PO TABS
1.0000 | ORAL_TABLET | Freq: Every day | ORAL | 11 refills | Status: DC
  Filled 2020-10-05: qty 84, 84d supply, fill #0
  Filled 2021-01-03: qty 84, 84d supply, fill #1

## 2020-10-05 NOTE — Telephone Encounter (Signed)
Opt called stating that her birth control was not sent  into pharmacy- confirmed pharmacy as [facility] outpatient pharmacy.

## 2020-10-05 NOTE — Telephone Encounter (Signed)
Done. Sent patient mychart message to inform her.

## 2020-10-10 ENCOUNTER — Ambulatory Visit (INDEPENDENT_AMBULATORY_CARE_PROVIDER_SITE_OTHER): Payer: No Typology Code available for payment source | Admitting: Psychology

## 2020-10-10 DIAGNOSIS — F4323 Adjustment disorder with mixed anxiety and depressed mood: Secondary | ICD-10-CM | POA: Diagnosis not present

## 2020-10-23 ENCOUNTER — Telehealth: Payer: Self-pay | Admitting: Primary Care

## 2020-10-23 NOTE — Telephone Encounter (Signed)
Pt called in stating that she fell on her back Saturday while skating and she wants provider to order lidocaine 5% or higher. Pt buys 4% over the counter and has purchased 10 of them. Pt has appointment Friday with Dr. Daphine Deutscher her surgeon

## 2020-10-24 ENCOUNTER — Ambulatory Visit: Payer: No Typology Code available for payment source | Admitting: Psychology

## 2020-10-24 NOTE — Telephone Encounter (Signed)
I am happy to help, but patient needs office visit for this type of Rx.  She can try diclofenac gel, Tylenol 1000 mg three times daily as needed. Be sure to stretch.  I am happy to see her if she'd like to be evaluated.

## 2020-10-25 NOTE — Telephone Encounter (Signed)
Left message to return call to our office.  

## 2020-10-25 NOTE — Telephone Encounter (Signed)
Pt is wanting a call back 

## 2020-10-25 NOTE — Telephone Encounter (Signed)
Pt is returning call.  

## 2020-10-25 NOTE — Telephone Encounter (Signed)
Spoke to patient she will try recommendations she has follow up with surgeon later in the week. She will let our office know if any issues. She also did want you to know she has seen GYN and has not had any recent bleeding.

## 2020-10-25 NOTE — Telephone Encounter (Signed)
Noted and very glad to hear this!

## 2020-10-26 ENCOUNTER — Other Ambulatory Visit: Payer: Self-pay | Admitting: Surgery

## 2020-10-26 ENCOUNTER — Ambulatory Visit
Admission: RE | Admit: 2020-10-26 | Discharge: 2020-10-26 | Disposition: A | Discharge status: Home / Self Care | Payer: No Typology Code available for payment source | Source: Ambulatory Visit | Attending: Surgery | Admitting: Surgery

## 2020-10-26 ENCOUNTER — Ambulatory Visit: Payer: No Typology Code available for payment source | Admitting: Psychology

## 2020-10-26 DIAGNOSIS — W19XXXA Unspecified fall, initial encounter: Secondary | ICD-10-CM

## 2020-11-13 ENCOUNTER — Ambulatory Visit (INDEPENDENT_AMBULATORY_CARE_PROVIDER_SITE_OTHER): Payer: No Typology Code available for payment source | Admitting: Psychology

## 2020-11-13 DIAGNOSIS — F4323 Adjustment disorder with mixed anxiety and depressed mood: Secondary | ICD-10-CM

## 2020-11-20 ENCOUNTER — Ambulatory Visit (INDEPENDENT_AMBULATORY_CARE_PROVIDER_SITE_OTHER): Payer: No Typology Code available for payment source | Admitting: Psychology

## 2020-11-20 DIAGNOSIS — F4323 Adjustment disorder with mixed anxiety and depressed mood: Secondary | ICD-10-CM | POA: Diagnosis not present

## 2020-11-27 ENCOUNTER — Ambulatory Visit (INDEPENDENT_AMBULATORY_CARE_PROVIDER_SITE_OTHER): Payer: No Typology Code available for payment source | Admitting: Psychology

## 2020-11-27 DIAGNOSIS — F4323 Adjustment disorder with mixed anxiety and depressed mood: Secondary | ICD-10-CM

## 2020-12-10 ENCOUNTER — Other Ambulatory Visit: Payer: Self-pay

## 2020-12-10 MED ORDER — PANTOPRAZOLE SODIUM 40 MG PO TBEC
DELAYED_RELEASE_TABLET | ORAL | 11 refills | Status: DC
  Filled 2020-12-10: qty 30, 30d supply, fill #0
  Filled 2021-02-06: qty 30, 30d supply, fill #1
  Filled 2021-03-12: qty 30, 30d supply, fill #2
  Filled 2021-05-13: qty 90, 90d supply, fill #3

## 2020-12-12 ENCOUNTER — Ambulatory Visit: Payer: No Typology Code available for payment source | Admitting: Psychology

## 2020-12-13 ENCOUNTER — Ambulatory Visit (INDEPENDENT_AMBULATORY_CARE_PROVIDER_SITE_OTHER): Payer: No Typology Code available for payment source | Admitting: Psychology

## 2020-12-13 DIAGNOSIS — F4323 Adjustment disorder with mixed anxiety and depressed mood: Secondary | ICD-10-CM

## 2020-12-25 ENCOUNTER — Ambulatory Visit: Payer: No Typology Code available for payment source | Admitting: Primary Care

## 2020-12-25 ENCOUNTER — Other Ambulatory Visit: Payer: Self-pay

## 2020-12-25 MED ORDER — CHLORZOXAZONE 500 MG PO TABS
ORAL_TABLET | ORAL | 0 refills | Status: DC
  Filled 2020-12-25: qty 20, 5d supply, fill #0

## 2020-12-28 ENCOUNTER — Ambulatory Visit (INDEPENDENT_AMBULATORY_CARE_PROVIDER_SITE_OTHER): Payer: No Typology Code available for payment source | Admitting: Psychology

## 2020-12-28 DIAGNOSIS — F4323 Adjustment disorder with mixed anxiety and depressed mood: Secondary | ICD-10-CM | POA: Diagnosis not present

## 2020-12-28 NOTE — Progress Notes (Signed)
[facility] Counselor/Therapist Progress Note  Patient ID: Crystal Diaz, MRN: [id number],    Date: 12/28/2020  Time Spent: 50 mins  Treatment Type: Individual Therapy  Reported Symptoms: Pt presented for a follow up session, via webex video, due to the virus outbreak.  Pt granted consent for the session, stating that she is in her home with no one else present.  I shared with pt that I am in my office with no one else here either.  Previous pt of Crystal Diaz; saw her for about 3 yrs.  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    Mental Status Exam: Appearance:  Casual     Behavior: Appropriate  Motor: Normal  Speech/Language:  Clear and Coherent  Affect: Appropriate  Mood: normal  Thought process: normal  Thought content:   WNL  Sensory/Perceptual disturbances:   WNL  Orientation: oriented to person, place, time/date, and situation  Attention: Good  Concentration: Good  Memory: WNL  Fund of knowledge:  Good  Insight:   Good  Judgment:  Good  Impulse Control: Good   Risk Assessment: Danger to Self:  No Self-injurious Behavior: No Danger to Others: No Duty to Warn:no Physical Aggression / Violence:No  Access to Firearms a concern: No  Gang Involvement:No   Subjective: Pt shares that she has been troubled by her injury and how if happened.  She is disappointed that it happened and in how others have responded to her because of it.  Pt shares she they are going to the beach again on Christmas Eve and staying until the day after Christmas and she is looking forward to the trip.  Pt shares that Crystal Diaz is doing OK at this point; he was recently seen at [facility] by a PA there.  He was diagnosed with ADHD and Impulsivity  at that visit.  He also has a Therapist, sports at [facility] who is also supportive of  Crystal Diaz.  He will be on Winter Break from school for the next 2 wks.  Pt has her last exam that she will finish this evening and is looking forward to having more free time to enjoy her family for the next month.  Pt continues to interact with Crystal Diaz via video phone in the jail.  She has an appt on 12/22 with the [employer] to file the guardianship papers for Crystal Diaz.  She is still not aware of what evidence the system has against Crystal Diaz in relation to his crime he is accused of.  Pt shares that she just celebrated her 9th anniversary with the [facility] system.  She has been with Pt. Experience for the past 2 yrs.  Pt shares that she has been doing her nails as a self care activity and she is going at 3pm to get her lashes done as well.  [facility] as well.  Encouraged pt to continue with her self care activities and we will meet in 2 wks for a follow up session.  That session will be 1 day before the 2nd anniversary of her mom's passing.  Interventions: Cognitive Behavioral Therapy  Diagnosis:Adjustment disorder with mixed anxiety and depressed mood  Plan: Treatment Plan Strengths/Abilities:  Intelligent, Intuitive, Willing to participate in therapy Treatment Preferences:  Outpatient Individual  Therapy Statement of Needs:  Patient is to use CBT, mindfulness and coping skills to help manage and/or decrease symptoms associated with their diagnosis. Symptoms:  Depressed/Irritable mood, worry, social withdrawal Problems Addressed:  Depressive thoughts, Sadness, Sleep issues, etc. Long Term Goals:  Pt to reduce overall level, frequency, and intensity of the feelings of depression/anxiety as evidenced by decreased irritability, negative self talk, and helpless feelings from 6 to 7 days/week to 0 to 1 days/week, per client report, for at least 3 consecutive months.  Progress: 20% Short Term Goals:  Pt to verbally express understanding of the relationship between  feelings of depression/anxiety and their impact on thinking patterns and behaviors.  Pt to verbalize an understanding of the role that distorted thinking plays in creating fears, excessive worry, and ruminations.  Progress: 20% Target Date:  12/28/2021 Frequency:  Bi-weekly Modality:  Cognitive Behavioral Therapy Interventions by Therapist:  Therapist will use CBT, Mindfulness exercises, Coping skills and Referrals, as needed by client. Client has verbally approved this treatment plan.  Karie Kirks, [facility]

## 2021-01-01 ENCOUNTER — Other Ambulatory Visit: Payer: Self-pay

## 2021-01-01 MED ORDER — PREDNISONE 20 MG PO TABS
ORAL_TABLET | ORAL | 0 refills | Status: DC
  Filled 2021-01-01: qty 10, 5d supply, fill #0

## 2021-01-03 ENCOUNTER — Other Ambulatory Visit: Payer: Self-pay

## 2021-01-10 ENCOUNTER — Other Ambulatory Visit: Payer: Self-pay

## 2021-01-10 MED ORDER — GABAPENTIN 300 MG PO CAPS
ORAL_CAPSULE | ORAL | 0 refills | Status: DC
  Filled 2021-01-10: qty 90, 30d supply, fill #0

## 2021-01-11 ENCOUNTER — Other Ambulatory Visit: Payer: Self-pay

## 2021-01-11 ENCOUNTER — Ambulatory Visit (INDEPENDENT_AMBULATORY_CARE_PROVIDER_SITE_OTHER): Payer: No Typology Code available for payment source | Admitting: Psychology

## 2021-01-11 DIAGNOSIS — F4323 Adjustment disorder with mixed anxiety and depressed mood: Secondary | ICD-10-CM | POA: Diagnosis not present

## 2021-01-11 NOTE — Progress Notes (Signed)
[facility] Counselor/Therapist Progress Note  Patient ID: Crystal Diaz, MRN: [id number],    Date: 01/11/2021  Time Spent: 50 mins  Treatment Type: Individual Therapy  Reported Symptoms: Pt presented for a follow up session, via webex video, due to the virus outbreak.  Pt granted consent for the session, stating that she is in her home with no one else present.  I shared with pt that I am in my office with no one else here either.  Previous pt of Crystal Diaz; saw her for about 3 yrs.  Pt shares her shoulder is getting some better but she still has some healing to do.  She is scheduled to start PT next week.   Mental Status Exam: Appearance:  Casual     Behavior: Appropriate  Motor: Normal  Speech/Language:  Clear and Coherent  Affect: Appropriate  Mood: normal  Thought process: normal  Thought content:   WNL  Sensory/Perceptual disturbances:   WNL  Orientation: oriented to person, place, time/date, and situation  Attention: Good  Concentration: Good  Memory: WNL  Fund of knowledge:  Good  Insight:   Good  Judgment:  Good  Impulse Control: Good   Risk Assessment: Danger to Self:  No Self-injurious Behavior: No Danger to Others: No Duty to Warn:no Physical Aggression / Violence:No  Access to Firearms a concern: No  Gang Involvement:No   Subjective: Pt shares that she is being treated for her shoulder injury via worker's compensation.  There are lots of rules that are new to the pt, including having a case mgr go to her to doctor's appts and her PT appts.  This is a little uncomfortable for pt but she is adjusting to it.  Pt shares that her Christmas was good.  They went to the beach again and pt enjoyed their time there.  Pt did reflect on the fact that her parents are both gone and her brother is in jail.  Pt shares that she feels like she is doing a good job with all of her responsibilities at home and at work and at school.  Pt did take the guardianship  paperwork to the [employer] for her brother Crystal Diaz) on 12/22 and they have a court date on 02/05/21.  She continues to talk with Crystal Diaz regularly and it seems that Crystal Diaz is struggling with the voices he hears and pt feels somewhat disconnected from him as a result.  Pt also shares that tomorrow is the second anniversary of her mom's passing.  Encouraged pt to continue with her self care activities and we will meet in 2 wks for a follow up session.  Interventions: Cognitive Behavioral Therapy  Diagnosis:Adjustment disorder with mixed anxiety and depressed mood  Plan: Treatment Plan Strengths/Abilities:  Intelligent, Intuitive, Willing to participate in therapy Treatment Preferences:  Outpatient Individual Therapy Statement of Needs:  Patient is to use CBT, mindfulness and coping skills to help manage and/or decrease symptoms associated with their diagnosis. Symptoms:  Depressed/Irritable mood, worry, social withdrawal Problems Addressed:  Depressive thoughts, Sadness, Sleep issues, etc. Long Term Goals:  Pt to reduce overall level, frequency, and intensity of the feelings of depression/anxiety as evidenced by decreased irritability, negative self talk, and helpless feelings from 6 to 7 days/week to 0 to 1 days/week, per client report, for at least 3 consecutive months.  Progress: 20% Short Term Goals:  Pt to verbally express understanding of the relationship between feelings of depression/anxiety and their impact on thinking patterns and behaviors.  Pt to verbalize an understanding of the role that distorted thinking plays in creating fears, excessive worry, and ruminations.  Progress: 20% Target Date:  12/28/2021 Frequency:  Bi-weekly Modality:  Cognitive Behavioral Therapy Interventions by Therapist:  Therapist will use CBT, Mindfulness exercises, Coping skills and Referrals, as needed by client. Client has verbally approved this treatment plan.  Karie Kirks, [facility]

## 2021-01-15 ENCOUNTER — Encounter: Payer: Self-pay | Admitting: Physical Therapy

## 2021-01-15 ENCOUNTER — Ambulatory Visit: Payer: PRIVATE HEALTH INSURANCE | Attending: Surgical

## 2021-01-15 DIAGNOSIS — M6281 Muscle weakness (generalized): Secondary | ICD-10-CM | POA: Insufficient documentation

## 2021-01-15 DIAGNOSIS — M25511 Pain in right shoulder: Secondary | ICD-10-CM | POA: Insufficient documentation

## 2021-01-15 NOTE — Therapy (Signed)
[facility] [address] [place], [place], [address] Phone: [phone]   Fax:  [phone]  Physical Therapy Evaluation  Patient Details  Name: Crystal CritchleySherika M Conkel MRN: [id number] Date of Birth: 01-Jun-1990 No data recorded  Encounter Date: 01/15/2021   PT End of Session - 01/15/21 0957     Visit Number 1    Number of Visits 17    Date for PT Re-Evaluation 03/12/21    Authorization - Visit Number 1    Authorization - Number of Visits 10    Progress Note Due on Visit 10    PT Start Time 0845    PT Stop Time 0930    PT Time Calculation (min) 45 min    Activity Tolerance Patient limited by pain    Behavior During Therapy [facility] for tasks assessed/performed             Past Medical History:  Diagnosis Date   Anemia    Anxiety    GERD (gastroesophageal reflux disease)    Headache    Lumbar radiculopathy    PCOS (polycystic ovarian syndrome)    Plantar fasciitis, bilateral     Past Surgical History:  Procedure Laterality Date   FOOT SURGERY Right    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   Procedure: LAPAROSCOPIC ROUX-EN-Y GASTRIC BYPASS WITH HIATAL HERNIA REPAIR AND UPPER ENDOSCOPY;  Surgeon: Luretha MurphyMartin, Matthew, MD;  Location: [facility];  Service: General;  Laterality: N/A;   WISDOM TOOTH EXTRACTION      There were no vitals filed for this visit.    Subjective Assessment - 01/15/21 0842     Subjective Pt is a 31 y.o. female referred to OPPT for R shoulder/arm/ and periscapular pain. PMH includes: Anemia, anxiety, GERD, lumbar radiculopathy, Polycystic ovarian syndrome, B plantar fasciitis.    Pertinent History Pt is a 31 y.o. female referred to OPPT for R shoulder/arm/ and periscapular pain. PMH includes: Anemia, anxiety, GERD, lumbar radiculopathy, Polycystic ovarian syndrome, B plantar fasciitis. Pt reports onset of ~3 weeks ago with N/T down into RUE and hand. 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 MOI reported as an upward pull on R shoulder in axillary region causing GHJ elevation. Does reports symptoms as burning, N/T, throbbing at times in shoulder/periscapular region. Demonstrates tightness with cervial mobility but no reproduction of symptoms down RLE. typically hovers at 4/10 NPS, 7/10 NPS is worst. Currently a 6/10 NPS. Reports pain along posterior, lateral RUE onto dorsal R hand along4th and 5th finger. Reports symptoms improved with heat, prednisone. Pain is worse with shoulder mobility with getting dressed, driving, with RUE in dependent position worsens symptoms. Pt's goal is to improve shoulder pain, N/T symptoms.    Limitations House hold activities;Lifting;Standing;Walking    How long can you sit comfortably? unsure    How long can you stand comfortably? unsure    How long can you walk comfortably? unsure    Diagnostic tests none    Patient Stated Goals improve pain/symptoms    Currently in Pain? Yes    Pain Score 6     Pain Location Arm    Pain Orientation Right;Posterior;Lower;Mid;Upper    Pain Descriptors / Indicators Aching;Dull;Burning;Numbness;Sharp;Tingling;Shooting;Crying;Grimacing;Guarding    Pain Type Acute pain;Neuropathic pain    Pain Radiating Towards RUE into dorsal aspect of R hand    Pain Onset 1 to 4 weeks ago  Pain Frequency Constant    Aggravating Factors  Dependent resting of RUE, reaching/overhead use of RUE with ADL's    Pain Relieving Factors heat, medication    Effect of Pain on Daily Activities Getting dressed, bathing ADL's, overhead ADL's, work tasks    Multiple Pain Sites Yes    Pain Score 6    Pain Location Scapula    Pain Orientation Right    Pain Descriptors / Indicators Aching;Dull    Pain Type Acute pain            OBJECTIVE Patient's fund of knowledge is within normal limits for educational level.  SENSATION: Grossly intact to light touch  bilateral UE as determined by testing dermatomes C2-T2 Proprioception and hot/cold testing deferred on this date   MUSCULOSKELETAL: Tremor: None Bulk: Normal Tone: Normal  Posture: Forward head posture, rounded shoulders, L lat flexion at trunk to support R shoulder.   Palpation: TTP along R GHJ, R sided paraspinals, and middle trap/rhomoids at medial scapular border. Noted, concordant trigger points.   Strength R/L 4*/5 Shoulder flexion (anterior deltoid/pec major/coracobrachialis, axillary n. (C5/6) and musculocutaneous n. (C5-7)) 4*/5 Shoulder abduction (deltoid/supraspinatus, axillary/suprascapular n, C5) 4*/5 Elbow flexion (biceps brachii, brachialis, brachioradialis, musculoskeletal n, C5/6) 3+*/5 Elbow extension (triceps, radial n, C7) 5/5 Wrist Extension (C6/7) 5/5 Wrist Flexion (C6/7) 5/5 Finger adduction (interossei, ulnar n, T1)   AROM R/L 50 Cervical Flexion 45* Cervical Extension 30*/42* Cervical Lateral Flexion 55*/75 Cervical Rotation *Indicates pain, overpressure performed unless otherwise indicated  Shoulder abduction  86*/180  Shoulder flex 91*/180   Repeated Movements No centralization or peripheralization of symptoms with repeated cervical retraction     Passive Accessory Intervertebral Motion (PAIVM) Deferred due to acuity of pain   SPECIAL TESTS Spurlings A (ipsilateral lateral flexion/axial compression): R: Negative L: Negative Spurlings B (ipsilateral lateral flexion/contralateral rotation/axial compression): R: Negative L: Negative Distraction Test: Negative  ULTT Median: R: Not examined L: Not examined ULTT Ulnar: R: Not examined L: Not examined ULTT Radial: R: Not examined L: Not examined     Objective measurements completed on examination: See above findings.                PT Education - 01/15/21 0957     Education Details Tissue helaing guidlines, POC, HEP.    Person(s) Educated Patient    Methods  Explanation;Demonstration;Tactile cues;Verbal cues;Handout    Comprehension Verbalized understanding;Returned demonstration;Need further instruction              PT Short Term Goals - 01/15/21 1319       PT SHORT TERM GOAL #1   Title Pt will be independent with HEP to improve pain, AROM, and strength to return to PLOF.    Baseline 01/16/2020: initiated    Time 4    Period Weeks    Status New    Target Date 02/12/21               PT Long Term Goals - 01/15/21 1320       PT LONG TERM GOAL #1   Title Pt will improve FOTO score to target to demo clinically significant improvement in functional mobility.    Baseline 01/16/20: 41 with target of 60    Time 8    Period Weeks    Status New    Target Date 03/12/21      PT LONG TERM GOAL #2   Title Pt will improve shoulder AROM to at least >160 degrees to demo ability to perform  overhead ADL's.    Baseline 1/3: 91 deg elevation, 86 abduction    Time 8    Period Weeks    Status New    Target Date 03/12/21      PT LONG TERM GOAL #3   Title Pt will improve pain to < 5/10 NPS to shoulder motion to demo improved pain with functional mobility for ADL completion.    Baseline 01/15/21: Up to 7/10 NPS with shoulder motion    Time 8    Period Weeks    Status New    Target Date 03/12/21                    Plan - 01/15/21 0958     Clinical Impression Statement Pt is a pleasant 31 y.o. female presenting to OPPT for acute to subacute nerve pain from R shoulder into R hand. Pt had MOI of upward force of R shoulder at GHJ leading to R cervical, shoulder and global RUE radicular symptoms. Objective measures limited/deferred due to acuity of nerve pain and performed to pt tolerance. Pt is postured in L lat lean supporting RUE with R UT compensation. R GHJ does not display any shoulder instability with no lag or GHJ depression in sitting. Pt has normal GHJ mobility in B shoulders with no clicking, popping, excessive motion. Pt  presents with normal sensation to LT bilaterally with limited cervical mobility in extension, B lat flexion, R>L, and rotation. Cervical mobility presents with concordant neck pain but no radicular symptoms. No symptom improvement with manual cervical traction and negative Spurling's for radicular symptoms but does reproduce cervical pain. Pt limited in gross shoulder mobility in all planes on RUE due to significant pain also leading to muscular weakness from pain, LUE WFL in all planes. No significant,focal,  concerning weakness appreciated in RUE, seems to be most limited due to pain. TTP along B paraspinals with concordant trigger point snoted along R medial border of scapula at mid trap and rhomboids. 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 These impairments are limiting pt from completion of UE ADL completion such as getting dressed, bathing, job tasks with significant pain. Pt will benefit from skilled PT intervention to address these impairments to return to PLOF.    Personal Factors and Comorbidities Age;Time since onset of injury/illness/exacerbation;Comorbidity 3+;Education;Fitness;Profession    Comorbidities Anemia, anxiety, GERD, lumbar radiculopathy, Polycystic ovarian syndrome, B plantar fasciitis.    Examination-Activity Limitations Reach Overhead;Carry;Dressing;Sleep;Sit;Transfers;Toileting;Stand    Examination-Participation Restrictions Community Activity;Driving;Occupation;Yard Work    Conservation officer, historic buildings Evolving/Moderate complexity    Clinical Decision Making Moderate    Rehab Potential Good    PT Frequency 2x / week    PT Duration 8 weeks    PT Treatment/Interventions ADLs/Self Care Home Management;Aquatic Therapy;Cryotherapy;Electrical Stimulation;Moist Heat;Traction;Gait training;Functional mobility  training;Therapeutic activities;Patient/family education;Therapeutic exercise;Balance training;Neuromuscular re-education;Manual techniques;Passive range of motion;Dry needling;Spinal Manipulations;Joint Manipulations    PT Next Visit Plan Reassess HEP, isometrics maybe? STM to R cervical/periscapular region.    PT Home Exercise Plan Access Code: [email or web address]    Consulted and Agree with Plan of Care Patient             Patient will benefit from skilled therapeutic intervention in order to improve the following deficits and impairments:  Pain, Decreased mobility, Increased muscle spasms, Postural dysfunction, Decreased activity tolerance, Decreased range of motion, Decreased strength  Visit Diagnosis: Muscle weakness (generalized)  Acute pain of right shoulder     Problem List Patient Active Problem List   Diagnosis Date Noted   Vitamin D deficiency 06/08/2020   Abnormal uterine bleeding 12/02/2019   Screening for STD (sexually transmitted disease) 10/19/2018   Encounter for birth control 03/23/2018   GERD (gastroesophageal reflux disease) 03/23/2018   Preventative health care 03/23/2018   Vitamin B12 deficiency due to intestinal malabsorption 12/16/2017   GAD (generalized anxiety disorder) 11/25/2017   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 Fairly IV, PT, DPT Physical Therapist- [place]  [facility]  01/15/2021, 1:32 PM  [facility] [address] [place], [place], [id number] Phone: [phone]   Fax:  [phone]  Name: Crystal CritchleySherika M Amason MRN: [id number] Date of Birth: 14-Dec-1990

## 2021-01-17 ENCOUNTER — Ambulatory Visit: Payer: PRIVATE HEALTH INSURANCE | Attending: Surgical | Admitting: Physical Therapy

## 2021-01-17 DIAGNOSIS — M6281 Muscle weakness (generalized): Secondary | ICD-10-CM | POA: Insufficient documentation

## 2021-01-17 DIAGNOSIS — M25511 Pain in right shoulder: Secondary | ICD-10-CM | POA: Diagnosis not present

## 2021-01-17 NOTE — Therapy (Signed)
[place] PHYSICAL AND SPORTS MEDICINE [address], [place], [address] Phone: [phone]   Fax:  [phone]  Physical Therapy Treatment  Patient Details  Name: Crystal Diaz MRN: [id number] Date of Birth: December 19, 1990 No data recorded  Encounter Date: 01/17/2021   PT End of Session - 01/17/21 1442     Visit Number 2    Number of Visits 17    Date for PT Re-Evaluation 03/12/21    Authorization Type Worker's [employer]    Authorization - Visit Number 2    Authorization - Number of Visits 12    Progress Note Due on Visit 10    PT Start Time 1015    PT Stop Time 1100    PT Time Calculation (min) 45 min    Activity Tolerance Patient limited by pain    Behavior During Therapy Anxious             Past Medical History:  Diagnosis Date   Anemia    Anxiety    GERD (gastroesophageal reflux disease)    Headache    Lumbar radiculopathy    PCOS (polycystic ovarian syndrome)    Plantar fasciitis, bilateral     Past Surgical History:  Procedure Laterality Date   FOOT SURGERY Right    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   Procedure: LAPAROSCOPIC ROUX-EN-Y GASTRIC BYPASS WITH HIATAL HERNIA REPAIR AND UPPER ENDOSCOPY;  Surgeon: Johnathan Hausen, MD;  Location: [facility];  Service: General;  Laterality: N/A;   WISDOM TOOTH EXTRACTION      There were no vitals filed for this visit.   Subjective Assessment - 01/17/21 1024     Subjective Pt reports that she is still experiencing irritiation in the right shoulder and numbness and tingling down the right arm into the left pinky and ring finger.    Pertinent History Pt is a 31 y.o. female referred to OPPT for R shoulder/arm/ and periscapular pain. PMH includes: Anemia, anxiety, GERD, lumbar radiculopathy, Polycystic ovarian syndrome, B plantar fasciitis. Pt reports onset of ~3 weeks ago with N/T down into RUE and hand. 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 MOI reported as an upward pull on R shoulder in axillary region causing GHJ elevation. Does reports symptoms as burning, N/T, throbbing at times in shoulder/periscapular region. Demonstrates tightness with cervial mobility but no reproduction of symptoms down RLE. typically hovers at 4/10 NPS, 7/10 NPS is worst. Currently a 6/10 NPS. Reports pain along posterior, lateral RUE onto dorsal R hand along4th and 5th finger. Reports symptoms improved with heat, prednisone. Pain is worse with shoulder mobility with getting dressed, driving, with RUE in dependent position worsens symptoms. Pt's goal is to improve shoulder pain, N/T symptoms.    Limitations House hold activities;Lifting;Standing;Walking    How long can you sit comfortably? unsure    How long can you stand comfortably? unsure    How long can you walk comfortably? unsure    Diagnostic tests none    Patient Stated Goals improve pain/symptoms    Currently in Pain? Yes    Pain Score 6     Pain Location Arm    Pain Orientation Right;Anterior;Lower;Upper    Pain Descriptors / Indicators Aching;Burning;Numbness;Sharp;Crying;Grimacing    Pain Type Acute pain;Neuropathic pain    Pain Radiating Towards RUE into dorsal aspect of R hand and pinky and ring finger  Pain Onset 1 to 4 weeks ago    Pain Frequency Constant            THEREX:   Shoulder AAROM Abduction/Adduction Pulleys 3 x 10 Shoulder AAROM Flexion/Extension Pulleys 3 x 10    Towel Slide Shoulder Forward Flexion/Extension 1 x 10   Shoulder Flexion Isometric 5 sec hold 1 x 10  Shoulder Abduction Isometric 5 sec hold 1 x 10  MANUAL THERAPY:  Upper Trap Soft Tissue Trigger Point Release and Massage  -Increased muscular tension throughout left upper trap   Left Upper Trap Stretch               PT Education - 01/17/21 1443     Education Details form and technique for appropriate exercise     Person(s) Educated Patient    Methods Explanation;Demonstration;Verbal cues;Tactile cues;Handout    Comprehension Verbalized understanding;Returned demonstration;Verbal cues required;Tactile cues required              PT Short Term Goals - 01/17/21 1439       PT SHORT TERM GOAL #1   Title Pt will be independent with HEP to improve pain, AROM, and strength to return to PLOF.    Baseline 01/16/2020: initiated    Time 4    Period Weeks    Status On-going    Target Date 02/12/21               PT Long Term Goals - 01/17/21 1439       PT LONG TERM GOAL #1   Title Pt will improve FOTO score to target to demo clinically significant improvement in functional mobility.    Baseline 01/16/20: 41 with target of 60    Time 8    Period Weeks    Status New    Target Date 03/12/21      PT LONG TERM GOAL #2   Title Pt will improve shoulder AROM to at least >160 degrees to demo ability to perform overhead ADL's.    Baseline 1/3: 91 deg elevation, 86 abduction    Time 8    Period Weeks    Status On-going    Target Date 03/12/21      PT LONG TERM GOAL #3   Title Pt will improve pain to < 5/10 NPS to shoulder motion to demo improved pain with functional mobility for ADL completion.    Baseline 01/15/21: Up to 7/10 NPS with shoulder motion    Time 8    Period Weeks    Status On-going    Target Date 03/12/21                   Plan - 01/17/21 1434     Clinical Impression Statement Pt presents for f/u for RUE pain with radicular symptoms. She continues to remain in the acute pain phase with guarding and pain limiting her ROM. She was able to complete shoulder isometrics without an increase in her shoulder pain or numbness and tingling. Pt is compensating with decreased right shoulder function with overactivation of upper trap resulting in increased musclar tension and pain. Soft tissue work did relieve upper trap tension but increased pain response limiting amount of pressure that  can be placed on muscle. She will continue to benefit from skilled PT to reduce upper trap guarding, increase right shoulder ROM and decrease right shoulder pain to regain use of right arm for overhead tasks such as grooming and reaching for objects in cabinets for meal  prep.    Personal Factors and Comorbidities Age;Time since onset of injury/illness/exacerbation;Comorbidity 3+;Education;Fitness;Profession    Comorbidities Anemia, anxiety, GERD, lumbar radiculopathy, Polycystic ovarian syndrome, B plantar fasciitis.    Examination-Activity Limitations Reach Overhead;Carry;Dressing;Sleep;Sit;Transfers;Toileting;Stand    Examination-Participation Restrictions Community Activity;Driving;Occupation;Yard Work    Biomedical scientist Low    Rehab Potential Good    PT Frequency 2x / week    PT Duration 8 weeks    PT Treatment/Interventions ADLs/Self Care Home Management;Aquatic Therapy;Cryotherapy;Electrical Stimulation;Moist Heat;Traction;Gait training;Functional mobility training;Therapeutic activities;Patient/family education;Therapeutic exercise;Balance training;Neuromuscular re-education;Manual techniques;Passive range of motion;Dry needling;Spinal Manipulations;Joint Manipulations    PT Next Visit Plan STM to R cervical/periscapular region. Progress shoulder mobility and progress parascapular and shoulder strengthening exercises. Incorporate tennis ball self-massage.    PT Home Exercise Plan Access Code: [email or web address]    Consulted and Agree with Plan of Care Patient            HEP includes the following:   Access Code: [email or web address] URL: [email or web address] Date: 01/17/2021 Prepared by: Bradly Chris  Exercises Seated Shoulder Flexion AAROM with Pulley Behind - 1 x daily - 7 x weekly - 1 sets - 12 reps Seated Cervical Retraction - 1 x daily - 7 x weekly - 2 sets - 15 reps Seated Scapular Retraction - 1 x daily  - 7 x weekly - 2 sets - 115 reps Ulnar Nerve Flossing - 1 x daily - 3 x weekly - 2 sets - 10 reps Isometric Shoulder Flexion at Wall - 1 x daily - 3 x weekly - 3 sets - 5 reps - 5 hold Isometric Shoulder Abduction at Wall - 1 x daily - 3 x weekly - 3 sets - 5 reps - 5 hold   Patient will benefit from skilled therapeutic intervention in order to improve the following deficits and impairments:  Pain, Decreased mobility, Increased muscle spasms, Postural dysfunction, Decreased activity tolerance, Decreased range of motion, Decreased strength  Visit Diagnosis: Acute pain of right shoulder  Muscle weakness (generalized)     Problem List Patient Active Problem List   Diagnosis Date Noted   Vitamin D deficiency 06/08/2020   Abnormal uterine bleeding 12/02/2019   Screening for STD (sexually transmitted disease) 10/19/2018   Encounter for birth control 03/23/2018   GERD (gastroesophageal reflux disease) 03/23/2018   Preventative health care 03/23/2018   Vitamin B12 deficiency due to intestinal malabsorption 12/16/2017   GAD (generalized anxiety disorder) 11/25/2017   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 [place], [address] Phone: [phone]   Fax:  [phone]  Name: BURNETTA KOU MRN: [id number] Date of Birth: [birth year]

## 2021-01-21 ENCOUNTER — Ambulatory Visit: Payer: PRIVATE HEALTH INSURANCE | Admitting: Physical Therapy

## 2021-01-21 DIAGNOSIS — M6281 Muscle weakness (generalized): Secondary | ICD-10-CM

## 2021-01-21 DIAGNOSIS — M25511 Pain in right shoulder: Secondary | ICD-10-CM

## 2021-01-21 NOTE — Therapy (Signed)
[facility] [address], [place], [address] Phone: [phone]   Fax:  [phone]  Physical Therapy Treatment  Patient Details  Name: Crystal Diaz MRN: [id number] Date of Birth: [birth year] No data recorded  Encounter Date: 01/21/2021   PT End of Session - 01/21/21 1501     Visit Number 3    Number of Visits 17    Date for PT Re-Evaluation 03/12/21    Authorization Type Worker's Comp    Authorization - Visit Number 3    Authorization - Number of Visits 12    Progress Note Due on Visit 10    PT Start Time 0215    PT Stop Time [id number]    PT Time Calculation (min) 30 min    Activity Tolerance Patient limited by pain    Behavior During Therapy Anxious;WFL for tasks assessed/performed             Past Medical History:  Diagnosis Date   Anemia    Anxiety    GERD (gastroesophageal reflux disease)    Headache    Lumbar radiculopathy    PCOS (polycystic ovarian syndrome)    Plantar fasciitis, bilateral     Past Surgical History:  Procedure Laterality Date   FOOT SURGERY Right    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   Procedure: LAPAROSCOPIC ROUX-EN-Y GASTRIC BYPASS WITH HIATAL HERNIA REPAIR AND UPPER ENDOSCOPY;  Surgeon: Johnathan Hausen, MD;  Location: [facility];  Service: General;  Laterality: N/A;   WISDOM TOOTH EXTRACTION      There were no vitals filed for this visit.   Subjective Assessment - 01/21/21 1416     Subjective Pt reports that she is continuing experience pain in the back of the right shoulder and still has numbness and tingling in her fingers. Pain is still the same as last time and her muscles are constantly tensed.    Pertinent History Pt is a 31 y.o. female referred to OPPT for R shoulder/arm/ and periscapular pain. PMH includes: Anemia, anxiety, GERD, lumbar radiculopathy, Polycystic ovarian syndrome, B plantar fasciitis. Pt reports onset of ~3 weeks ago with N/T down into RUE and hand.  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 MOI reported as an upward pull on R shoulder in axillary region causing GHJ elevation. Does reports symptoms as burning, N/T, throbbing at times in shoulder/periscapular region. Demonstrates tightness with cervial mobility but no reproduction of symptoms down RLE. typically hovers at 4/10 NPS, 7/10 NPS is worst. Currently a 6/10 NPS. Reports pain along posterior, lateral RUE onto dorsal R hand along4th and 5th finger. Reports symptoms improved with heat, prednisone. Pain is worse with shoulder mobility with getting dressed, driving, with RUE in dependent position worsens symptoms. Pt's goal is to improve shoulder pain, N/T symptoms.    Limitations House hold activities;Lifting;Standing;Walking    How long can you sit comfortably? unsure    How long can you stand comfortably? unsure    How long can you walk comfortably? unsure    Diagnostic tests none    Patient Stated Goals improve pain/symptoms    Currently in Pain? Yes    Pain Score 6     Pain Location Arm    Pain Orientation Right    Pain Descriptors / Indicators Aching;Burning    Pain Type Acute pain;Neuropathic pain    Pain Onset 1 to  4 weeks ago             MANUAL THERAPY:  Suboccipital Release  Upper Trap Trigger Point Release  Upper Trap Stretch  PROM Right Shoulder Abduction   Palpation of biceps tendon TTP  -Increased tenderness with IR and ER of shoulder and resisted shoulder flexion   THEREX   Impingement Test on RUE: + Painful Arc + Neer's   Shoulder AAROM Flexion and Extension Pulleys 2 x 10  -Pt repeatedly experiences pain between 90 and 120 degrees of flexion and abduction   Supine Chin Tucks 1 x 5  -Pt continues to feel pain in neck musculature   Instructed on self massage using tennis ball against wall on lateral, anterior, and posterior portion of  shoulder.    Updated HEP and educated patient on changes to exercises which include removal of chin tucks and addition of self-massage with tennis ball.               PT Short Term Goals - 01/17/21 1439       PT SHORT TERM GOAL #1   Title Pt will be independent with HEP to improve pain, AROM, and strength to return to PLOF.    Baseline 01/16/2020: initiated    Time 4    Period Weeks    Status On-going    Target Date 02/12/21               PT Long Term Goals - 01/17/21 1439       PT LONG TERM GOAL #1   Title Pt will improve FOTO score to target to demo clinically significant improvement in functional mobility.    Baseline 01/16/20: 41 with target of 60    Time 8    Period Weeks    Status New    Target Date 03/12/21      PT LONG TERM GOAL #2   Title Pt will improve shoulder AROM to at least >160 degrees to demo ability to perform overhead ADL's.    Baseline 1/3: 91 deg elevation, 86 abduction    Time 8    Period Weeks    Status On-going    Target Date 03/12/21      PT LONG TERM GOAL #3   Title Pt will improve pain to < 5/10 NPS to shoulder motion to demo improved pain with functional mobility for ADL completion.    Baseline 01/15/21: Up to 7/10 NPS with shoulder motion    Time 8    Period Weeks    Status On-going    Target Date 03/12/21                   Plan - 01/21/21 1438     Clinical Impression Statement Pt presents for f/u for RUE pain with radiculopathy symptoms. Pt continues to experience significant pain with right shoulder overhead movement with signs and symptoms suggestive of impingement with increased pain within 60 to 120 degrees of flexion and positive neer's. This is likely related to byproduct of recent shoulder trauma. She reports relief with self-massage and will continue to benefit from pain modulation interventions at this stage in her PT. Pt had prior engagement so she had to leave 15 minutes early.  She will continue to benefit  from skilled PT to reduce upper trap guarding, increase right shoulder ROM and decrease right shoulder pain to regain use of right arm for overhead tasks such as grooming and reaching for objects in cabinets for meal prep.  Personal Factors and Comorbidities Age;Time since onset of injury/illness/exacerbation;Comorbidity 3+;Education;Fitness;Profession    Comorbidities Anemia, anxiety, GERD, lumbar radiculopathy, Polycystic ovarian syndrome, B plantar fasciitis.    Examination-Activity Limitations Reach Overhead;Carry;Dressing;Sleep;Sit;Transfers;Toileting;Stand    Examination-Participation Restrictions Community Activity;Driving;Occupation;Yard Work    Biomedical scientist Low    Rehab Potential Good    PT Frequency 2x / week    PT Duration 8 weeks    PT Treatment/Interventions ADLs/Self Care Home Management;Aquatic Therapy;Cryotherapy;Electrical Stimulation;Moist Heat;Traction;Gait training;Functional mobility training;Therapeutic activities;Patient/family education;Therapeutic exercise;Balance training;Neuromuscular re-education;Manual techniques;Passive range of motion;Dry needling;Spinal Manipulations;Joint Manipulations    PT Next Visit Plan Rhythmic stabilization and parascapular strengthening exercises    PT Home Exercise Plan Access Code: [email or web address]    Consulted and Agree with Plan of Care Patient            HEP includes the following:  Access Code: [email or web address] URL: [email or web address] Date: 01/21/2021 Prepared by: Bradly Chris  Exercises Seated Shoulder Flexion AAROM with Pulley Behind - 1 x daily - 7 x weekly - 1 sets - 12 reps Seated Scapular Retraction - 1 x daily - 7 x weekly - 2 sets - 115 reps Ulnar Nerve Flossing - 1 x daily - 3 x weekly - 2 sets - 10 reps Isometric Shoulder Flexion at Wall - 1 x daily - 3 x weekly - 3 sets - 5 reps - 5 hold Isometric Shoulder Abduction at Wall - 1 x  daily - 3 x weekly - 3 sets - 5 reps - 5 hold   Patient will benefit from skilled therapeutic intervention in order to improve the following deficits and impairments:  Pain, Decreased mobility, Increased muscle spasms, Postural dysfunction, Decreased activity tolerance, Decreased range of motion, Decreased strength  Visit Diagnosis: Acute pain of right shoulder  Muscle weakness (generalized)     Problem List Patient Active Problem List   Diagnosis Date Noted   Vitamin D deficiency 06/08/2020   Abnormal uterine bleeding 12/02/2019   Screening for STD (sexually transmitted disease) 10/19/2018   Encounter for birth control 03/23/2018   GERD (gastroesophageal reflux disease) 03/23/2018   Preventative health care 03/23/2018   Vitamin B12 deficiency due to intestinal malabsorption 12/16/2017   GAD (generalized anxiety disorder) 11/25/2017   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, [place], [address] Phone: [phone]   Fax:  [phone]  Name: ALEYNAH BLUMBERG MRN: [id number] Date of Birth: [birth year]

## 2021-01-23 ENCOUNTER — Ambulatory Visit: Payer: PRIVATE HEALTH INSURANCE | Admitting: Physical Therapy

## 2021-01-23 DIAGNOSIS — M25511 Pain in right shoulder: Secondary | ICD-10-CM

## 2021-01-23 DIAGNOSIS — M6281 Muscle weakness (generalized): Secondary | ICD-10-CM

## 2021-01-23 NOTE — Therapy (Signed)
[facility] [address] [place], [address] Phone: [phone]   Fax:  [phone]  Physical Therapy Treatment  Patient Details  Name: Crystal Diaz MRN: [id number] Date of Birth: 1990/04/19 No data recorded  Encounter Date: 01/23/2021   PT End of Session - 01/23/21 1438     Visit Number 4    Number of Visits 17    Date for PT Re-Evaluation 03/12/21    Authorization Type Worker's Comp    Authorization - Visit Number 4    Authorization - Number of Visits 12    Progress Note Due on Visit 10    PT Start Time 1415    PT Stop Time 1500    PT Time Calculation (min) 45 min    Activity Tolerance Patient limited by pain    Behavior During Therapy Anxious;WFL for tasks assessed/performed             Past Medical History:  Diagnosis Date   Anemia    Anxiety    GERD (gastroesophageal reflux disease)    Headache    Lumbar radiculopathy    PCOS (polycystic ovarian syndrome)    Plantar fasciitis, bilateral     Past Surgical History:  Procedure Laterality Date   FOOT SURGERY Right    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   Procedure: LAPAROSCOPIC ROUX-EN-Y GASTRIC BYPASS WITH HIATAL HERNIA REPAIR AND UPPER ENDOSCOPY;  Surgeon: Luretha Murphy, MD;  Location: [facility];  Service: General;  Laterality: N/A;   WISDOM TOOTH EXTRACTION      There were no vitals filed for this visit.   Subjective Assessment - 01/23/21 1418     Subjective Pt reports that her neck and shoulder pain feels slightly better since doing self massage with tennis ball. She did experience increased neck and left shoulder pain when reaching up and outward at grocery store.    Pertinent History Pt is a 31 y.o. female referred to OPPT for R shoulder/arm/ and periscapular pain. PMH includes: Anemia, anxiety, GERD, lumbar radiculopathy, Polycystic ovarian syndrome, B plantar fasciitis. Pt reports onset of ~3 weeks ago with N/T down into RUE and  hand. 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 MOI reported as an upward pull on R shoulder in axillary region causing GHJ elevation. Does reports symptoms as burning, N/T, throbbing at times in shoulder/periscapular region. Demonstrates tightness with cervial mobility but no reproduction of symptoms down RLE. typically hovers at 4/10 NPS, 7/10 NPS is worst. Currently a 6/10 NPS. Reports pain along posterior, lateral RUE onto dorsal R hand along4th and 5th finger. Reports symptoms improved with heat, prednisone. Pain is worse with shoulder mobility with getting dressed, driving, with RUE in dependent position worsens symptoms. Pt's goal is to improve shoulder pain, N/T symptoms.    Limitations House hold activities;Lifting;Standing;Walking    How long can you sit comfortably? unsure    How long can you stand comfortably? unsure    How long can you walk comfortably? unsure    Diagnostic tests none    Patient Stated Goals improve pain/symptoms    Currently in Pain? Yes    Pain Score 6     Pain Location Arm    Pain Orientation Right    Pain Descriptors / Indicators Aching;Burning    Pain Type Acute pain;Neuropathic pain    Pain Onset 1 to 4 weeks ago  Pain Score 6    Pain Location Scapula    Pain Orientation Right    Pain Descriptors / Indicators Aching;Dull    Pain Type Acute pain             MANUAL THERAPY:  Right and Left Upper Trap massage and trigger point release   THEREX:  Rhythmic Stabilization -Flex + ext x 30  -Abb + Add x 30  Seated Rows 3 x10 with Yellow TB            PT Education - 01/23/21 1437     Education Details form and technique for appropriate exercise    Person(s) Educated Patient    Methods Explanation;Demonstration;Verbal cues;Handout;Tactile cues    Comprehension Verbalized understanding;Returned demonstration;Verbal cues  required;Tactile cues required              PT Short Term Goals - 01/23/21 1439       PT SHORT TERM GOAL #1   Title Pt will be independent with HEP to improve pain, AROM, and strength to return to PLOF.    Baseline 01/16/2020: initiated    Time 4    Period Weeks    Status On-going    Target Date 02/12/21               PT Long Term Goals - 01/23/21 1439       PT LONG TERM GOAL #1   Title Pt will improve FOTO score to target to demo clinically significant improvement in functional mobility.    Baseline 01/16/20: 41 with target of 60    Time 8    Period Weeks    Status On-going    Target Date 03/12/21      PT LONG TERM GOAL #2   Title Pt will improve shoulder AROM to at least >160 degrees to demo ability to perform overhead ADL's.    Baseline 1/3: 91 deg elevation, 86 abduction    Time 8    Period Weeks    Status On-going    Target Date 03/12/21      PT LONG TERM GOAL #3   Title Pt will improve pain to < 5/10 NPS to shoulder motion to demo improved pain with functional mobility for ADL completion.    Baseline 01/15/21: Up to 7/10 NPS with shoulder motion    Time 8    Period Weeks    Status On-going    Target Date 03/12/21                   Plan - 01/23/21 1438     Clinical Impression Statement Pt presents for f/u for RUE pain with radicular symptoms. Pt exhibits progress with pain tolerance with ability to flex RUE to 90 degrees and perform light, rhythmic stabilization exercises. She continues to demonstrate guarding with soft tissue work on right upper trap, but again able to tolerate a longer session and increased touch on upper trap and shoulder musculature. She will continue to benefit from skilled PT to reduce upper trap guarding, increase right shoulder ROM and decrease right shoulder pain to regain use of right arm for overhead tasks such as grooming and reaching for objects in cabinets for meal prep.    Personal Factors and Comorbidities Age;Time  since onset of injury/illness/exacerbation;Comorbidity 3+;Education;Fitness;Profession    Comorbidities Anemia, anxiety, GERD, lumbar radiculopathy, Polycystic ovarian syndrome, B plantar fasciitis.    Examination-Activity Limitations Reach Overhead;Carry;Dressing;Sleep;Sit;Transfers;Toileting;Stand    Examination-Participation Restrictions Community Activity;Driving;Occupation;Yard Work    Company secretary  Making Evolving/Moderate complexity    Clinical Decision Making Low    Rehab Potential Good    PT Frequency 2x / week    PT Duration 8 weeks    PT Treatment/Interventions ADLs/Self Care Home Management;Aquatic Therapy;Cryotherapy;Electrical Stimulation;Moist Heat;Traction;Gait training;Functional mobility training;Therapeutic activities;Patient/family education;Therapeutic exercise;Balance training;Neuromuscular re-education;Manual techniques;Passive range of motion;Dry needling;Spinal Manipulations;Joint Manipulations    PT Next Visit Plan Continue with soft tissue work and parascapular strengthening exercises.    PT Home Exercise Plan Access Code: [email or web address]    Consulted and Agree with Plan of Care Patient            HEP includes the following:  Access Code: [email or web address] URL: [email or web address] Date: 01/23/2021 Prepared by: Ellin Goodieaniel Athol Bolds  Exercises Ulnar Nerve Flossing - 1 x daily - 3 x weekly - 2 sets - 10 reps Isometric Shoulder Flexion at Wall - 1 x daily - 3 x weekly - 3 sets - 5 reps - 5 hold Isometric Shoulder Abduction at Wall - 1 x daily - 3 x weekly - 3 sets - 5 reps - 5 hold Supine Shoulder Rhythmic Stabilization- Horizontal Abduction/Adduction - 1 x daily - 7 x weekly - 3 sets - 10 reps Supine Shoulder Rhythmic Stabilization- Flexion/Extension - 1 x daily - 7 x weekly - 3 sets - 10 reps Seated Shoulder Row with Anchored Resistance - 1 x daily - 3 x weekly - 3 sets - 10 reps    Patient will benefit from skilled therapeutic intervention in  order to improve the following deficits and impairments:  Pain, Decreased mobility, Increased muscle spasms, Postural dysfunction, Decreased activity tolerance, Decreased range of motion, Decreased strength  Visit Diagnosis: Acute pain of right shoulder  Muscle weakness (generalized)     Problem List Patient Active Problem List   Diagnosis Date Noted   Vitamin D deficiency 06/08/2020   Abnormal uterine bleeding 12/02/2019   Screening for STD (sexually transmitted disease) 10/19/2018   Encounter for birth control 03/23/2018   GERD (gastroesophageal reflux disease) 03/23/2018   Preventative health care 03/23/2018   Vitamin B12 deficiency due to intestinal malabsorption 12/16/2017   GAD (generalized anxiety disorder) 11/25/2017   S/P gastric bypass 03/17/2017   Ellin Goodieaniel Veronica Fretz PT, DPT  01/24/2021, 9:06 AM  [facility] [address] [place], [place], [id number] Phone: [phone]   Fax:  [phone]  Name: Crystal CritchleySherika M Diaz MRN: [id number] Date of Birth: 1990-01-21

## 2021-01-25 ENCOUNTER — Ambulatory Visit: Payer: No Typology Code available for payment source | Admitting: Psychology

## 2021-01-28 ENCOUNTER — Ambulatory Visit: Payer: PRIVATE HEALTH INSURANCE | Admitting: Physical Therapy

## 2021-01-30 ENCOUNTER — Encounter: Payer: Self-pay | Admitting: Physical Therapy

## 2021-01-30 ENCOUNTER — Ambulatory Visit: Payer: PRIVATE HEALTH INSURANCE | Admitting: Physical Therapy

## 2021-01-30 DIAGNOSIS — M25511 Pain in right shoulder: Secondary | ICD-10-CM

## 2021-01-30 DIAGNOSIS — M6281 Muscle weakness (generalized): Secondary | ICD-10-CM

## 2021-01-30 NOTE — Therapy (Signed)
[facility]ICINE [address] [place], [place], [address] Phone: [phone]   Fax:  [phone]  Physical Therapy Treatment  Patient Details  Name: Crystal CritchleySherika M Diaz MRN: [id number] Date of Birth: 08-Feb-1990 No data recorded  Encounter Date: 01/30/2021   PT End of Session - 01/30/21 1035     Visit Number 5    Number of Visits 17    Date for PT Re-Evaluation 03/12/21    Authorization Type Worker's Comp    Authorization - Visit Number 5    Authorization - Number of Visits 12    Progress Note Due on Visit 10    PT Start Time 1015    PT Stop Time 1100    PT Time Calculation (min) 45 min    Activity Tolerance Patient limited by pain    Behavior During Therapy Anxious;WFL for tasks assessed/performed             Past Medical History:  Diagnosis Date   Anemia    Anxiety    GERD (gastroesophageal reflux disease)    Headache    Lumbar radiculopathy    PCOS (polycystic ovarian syndrome)    Plantar fasciitis, bilateral     Past Surgical History:  Procedure Laterality Date   FOOT SURGERY Right    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   Procedure: LAPAROSCOPIC ROUX-EN-Y GASTRIC BYPASS WITH HIATAL HERNIA REPAIR AND UPPER ENDOSCOPY;  Surgeon: Luretha MurphyMartin, Matthew, MD;  Location: [facility];  Service: General;  Laterality: N/A;   WISDOM TOOTH EXTRACTION      There were no vitals filed for this visit.   Subjective Assessment - 01/30/21 1018     Subjective Pt reports that exercises are going well and that she feels that the shoulder is feeling less tense.    Pertinent History Pt is a 31 y.o. female referred to OPPT for R shoulder/arm/ and periscapular pain. PMH includes: Anemia, anxiety, GERD, lumbar radiculopathy, Polycystic ovarian syndrome, B plantar fasciitis. Pt reports onset of ~3 weeks ago with N/T down into RUE and hand. 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 MOI reported as an upward pull on R shoulder in axillary region causing GHJ elevation. Does reports symptoms as burning, N/T, throbbing at times in shoulder/periscapular region. Demonstrates tightness with cervial mobility but no reproduction of symptoms down RLE. typically hovers at 4/10 NPS, 7/10 NPS is worst. Currently a 6/10 NPS. Reports pain along posterior, lateral RUE onto dorsal R hand along4th and 5th finger. Reports symptoms improved with heat, prednisone. Pain is worse with shoulder mobility with getting dressed, driving, with RUE in dependent position worsens symptoms. Pt's goal is to improve shoulder pain, N/T symptoms.    Limitations House hold activities;Lifting;Standing;Walking    How long can you sit comfortably? unsure    How long can you stand comfortably? unsure    How long can you walk comfortably? unsure    Diagnostic tests none    Patient Stated Goals improve pain/symptoms    Currently in Pain? Yes    Pain Score 4     Pain Location Arm    Pain Orientation Right    Pain Descriptors / Indicators Burning;Aching    Pain Type Acute pain;Neuropathic pain    Pain Onset 1 to 4 weeks ago             MANUAL THERAPY   Cervical  Distraction 5 x 30 sec  Upper Trap Stretch 2 x 60 sec  Trigger Point Release Upper Traps   THEREX:   Shoulder Flexion Eccentric to 90 deg flexion 2 x 10   Biceps Stretch using Edge of Mat 3 x 30 sec             PT Short Term Goals - 01/30/21 1240       PT SHORT TERM GOAL #1   Title Pt will be independent with HEP to improve pain, AROM, and strength to return to PLOF.    Baseline 01/16/2020: initiated    Time 4    Period Weeks    Status On-going    Target Date 02/12/21               PT Long Term Goals - 01/30/21 1240       PT LONG TERM GOAL #1   Title Pt will improve FOTO score to target to demo clinically significant improvement in functional mobility.    Baseline 01/16/20: 41  with target of 60    Time 8    Period Weeks    Status On-going    Target Date 03/12/21      PT LONG TERM GOAL #2   Title Pt will improve shoulder AROM to at least >160 degrees to demo ability to perform overhead ADL's.    Baseline 1/3: 91 deg elevation, 86 abduction    Time 8    Period Weeks    Status On-going    Target Date 03/12/21      PT LONG TERM GOAL #3   Title Pt will improve pain to < 5/10 NPS to shoulder motion to demo improved pain with functional mobility for ADL completion.    Baseline 01/15/21: Up to 7/10 NPS with shoulder motion    Time 8    Period Weeks    Status On-going    Target Date 03/12/21                    Plan - 01/30/21 1044     Clinical Impression Statement Pt presents for f/u for RUE pain with radicular symptoms. Pt shows improvement shoulder ROM with symptom response with ability to flex and abduct arm to 90 degrees without an increase in shoulder pain and numbness and tingling. Right upper trap tension reduced since initial eval and pain relief with right biceps stretch. She will continue to benefit from skilled PT to reduce upper trap guarding, increase right shoulder ROM and decrease right shoulder pain to regain use of right arm for overhead tasks such as grooming and reaching for objects in cabinets for meal prep.    Personal Factors and Comorbidities Age;Time since onset of injury/illness/exacerbation;Comorbidity 3+;Education;Fitness;Profession    Comorbidities Anemia, anxiety, GERD, lumbar radiculopathy, Polycystic ovarian syndrome, B plantar fasciitis.    Examination-Activity Limitations Reach Overhead;Carry;Dressing;Sleep;Sit;Transfers;Toileting;Stand    Examination-Participation Restrictions Community Activity;Driving;Occupation;Yard Work    Public house manager Low    Rehab Potential Good    PT Frequency 2x / week    PT Duration 8 weeks    PT  Treatment/Interventions ADLs/Self Care Home Management;Aquatic Therapy;Cryotherapy;Electrical Stimulation;Moist Heat;Traction;Gait training;Functional mobility training;Therapeutic activities;Patient/family education;Therapeutic exercise;Balance training;Neuromuscular re-education;Manual techniques;Passive range of motion;Dry needling;Spinal Manipulations;Joint Manipulations    PT Next Visit Plan Parascapular Soft Tisuse Work. Along with parascapular strengthening exercises    PT Home Exercise Plan Access Code: [email or web address]    Consulted and Agree with Plan of  Care Patient            HEP includes the following:  Access Code: [email or web address] URL: [email or web address] Date: 01/30/2021 Prepared by: Ellin Goodie  Exercises Ulnar Nerve Flossing - 1 x daily - 3 x weekly - 2 sets - 10 reps Bicep Stretch at Table - 1 x daily - 7 x weekly - 1 sets - 3 reps - 60 hold Isometric Shoulder Abduction at Wall - 1 x daily - 3 x weekly - 3 sets - 5 reps - 5 hold Supine Shoulder Rhythmic Stabilization- Horizontal Abduction/Adduction - 1 x daily - 7 x weekly - 3 sets - 10 reps Supine Shoulder Rhythmic Stabilization- Flexion/Extension - 1 x daily - 7 x weekly - 3 sets - 10 reps Seated Shoulder Row with Anchored Resistance - 1 x daily - 3 x weekly - 3 sets - 10 reps Standing Eccentric Shoulder Flexion at Wall - 1 x daily - 3 x weekly - 3 sets - 10 reps   Patient will benefit from skilled therapeutic intervention in order to improve the following deficits and impairments:  Pain, Decreased mobility, Increased muscle spasms, Postural dysfunction, Decreased activity tolerance, Decreased range of motion, Decreased strength  Visit Diagnosis: Acute pain of right shoulder  Muscle weakness (generalized)     Problem List Patient Active Problem List   Diagnosis Date Noted   Vitamin D deficiency 06/08/2020   Abnormal uterine bleeding 12/02/2019   Screening for STD (sexually transmitted disease)  10/19/2018   Encounter for birth control 03/23/2018   GERD (gastroesophageal reflux disease) 03/23/2018   Preventative health care 03/23/2018   Vitamin B12 deficiency due to intestinal malabsorption 12/16/2017   GAD (generalized anxiety disorder) 11/25/2017   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 [place], [address] Phone: [phone]   Fax:  [phone]  Name: MYLENE BOW MRN: [id number] Date of Birth: 1990-07-10

## 2021-01-31 ENCOUNTER — Ambulatory Visit (INDEPENDENT_AMBULATORY_CARE_PROVIDER_SITE_OTHER): Payer: No Typology Code available for payment source | Admitting: Psychology

## 2021-01-31 ENCOUNTER — Ambulatory Visit: Payer: PRIVATE HEALTH INSURANCE | Admitting: Physical Therapy

## 2021-01-31 DIAGNOSIS — F4323 Adjustment disorder with mixed anxiety and depressed mood: Secondary | ICD-10-CM

## 2021-01-31 DIAGNOSIS — M25511 Pain in right shoulder: Secondary | ICD-10-CM

## 2021-01-31 DIAGNOSIS — M6281 Muscle weakness (generalized): Secondary | ICD-10-CM | POA: Diagnosis not present

## 2021-01-31 NOTE — Therapy (Signed)
[facility] [facility] [address], [place], [address] Phone: [phone]   Fax:  [phone]  Physical Therapy Treatment  Patient Details  Name: Crystal Diaz MRN: [id number] Date of Birth: 11/16/1990 No data recorded  Encounter Date: 01/31/2021   PT End of Session - 01/31/21 1551     Visit Number 6    Number of Visits 12    Date for PT Re-Evaluation 03/12/21    Authorization Type Worker's Comp    Authorization - Visit Number 6    Authorization - Number of Visits 12    Progress Note Due on Visit 10    PT Start Time 1545    PT Stop Time 1630    PT Time Calculation (min) 45 min    Activity Tolerance Other (comment)   Patient limited by strees   Behavior During Therapy Anxious             Past Medical History:  Diagnosis Date   Anemia    Anxiety    GERD (gastroesophageal reflux disease)    Headache    Lumbar radiculopathy    PCOS (polycystic ovarian syndrome)    Plantar fasciitis, bilateral     Past Surgical History:  Procedure Laterality Date   FOOT SURGERY Right    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   Procedure: LAPAROSCOPIC ROUX-EN-Y GASTRIC BYPASS WITH HIATAL HERNIA REPAIR AND UPPER ENDOSCOPY;  Surgeon: Luretha Murphy, MD;  Location: [facility];  Service: General;  Laterality: N/A;   WISDOM TOOTH EXTRACTION      There were no vitals filed for this visit.   Subjective Assessment - 01/31/21 1505     Subjective Pt reports increased family stress. She describes continuing to experience pain when she lowers her arms below her shoulder. She is also still experiencing N/T.    Pertinent History Pt is a 31 y.o. female referred to OPPT for R shoulder/arm/ and periscapular pain. PMH includes: Anemia, anxiety, GERD, lumbar radiculopathy, Polycystic ovarian syndrome, B plantar fasciitis. Pt reports onset of ~3 weeks ago with N/T down into RUE and hand. 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 MOI reported as an upward pull on R shoulder in axillary region causing GHJ elevation. Does reports symptoms as burning, N/T, throbbing at times in shoulder/periscapular region. Demonstrates tightness with cervial mobility but no reproduction of symptoms down RLE. typically hovers at 4/10 NPS, 7/10 NPS is worst. Currently a 6/10 NPS. Reports pain along posterior, lateral RUE onto dorsal R hand along4th and 5th finger. Reports symptoms improved with heat, prednisone. Pain is worse with shoulder mobility with getting dressed, driving, with RUE in dependent position worsens symptoms. Pt's goal is to improve shoulder pain, N/T symptoms.    Limitations House hold activities;Lifting;Standing;Walking    How long can you sit comfortably? unsure    How long can you stand comfortably? unsure    How long can you walk comfortably? unsure    Diagnostic tests none    Patient Stated Goals improve pain/symptoms    Currently in Pain? Yes    Pain Score 4     Pain Location Arm    Pain Orientation Right    Pain Descriptors / Indicators Burning;Aching    Pain Type Acute pain;Neuropathic pain    Pain Onset 1 to 4 weeks ago    Pain Frequency Constant  MANUAL THERAPY:  SCM soft tissue massage  Upper trap massage and trigger point release  Upper trap stretch 2 x 60 sec  Cervical distraction 30 sec x 5  Neck Flexor Massage   Pt reports decrease in NPS to 6 out of 10   THEREX:   Supine Shoulder flexion/extension AAROM 3 x 10  Supine Shoulder abduction/adduction AAROM 3 x 10          PT Education - 01/31/21 1551     Education Details form and technique for appropriate exercise    Person(s) Educated Patient    Methods Explanation;Demonstration;Verbal cues;Handout    Comprehension Verbalized understanding;Returned demonstration;Verbal cues required              PT Short Term Goals - 01/31/21 1557        PT SHORT TERM GOAL #1   Title Pt will be independent with HEP to improve pain, AROM, and strength to return to PLOF.    Baseline 01/16/2020: initiated    Time 4    Period Weeks    Status On-going    Target Date 02/12/21               PT Long Term Goals - 01/31/21 1557       PT LONG TERM GOAL #1   Title Pt will improve FOTO score to target to demo clinically significant improvement in functional mobility.    Baseline 01/16/20: 41 with target of 60    Time 8    Period Weeks    Status On-going    Target Date 03/12/21      PT LONG TERM GOAL #2   Title Pt will improve shoulder AROM to at least >160 degrees to demo ability to perform overhead ADL's.    Baseline 1/3: 91 deg elevation, 86 abduction    Time 8    Period Weeks    Status On-going    Target Date 03/12/21      PT LONG TERM GOAL #3   Title Pt will improve pain to < 5/10 NPS to shoulder motion to demo improved pain with functional mobility for ADL completion.    Baseline 01/15/21: Up to 7/10 NPS with shoulder motion    Time 8    Period Weeks    Status On-going    Target Date 03/12/21                   Plan - 01/31/21 1553     Clinical Impression Statement Pt presents for f/u for RUE pain with radicular symptoms. Session limited by ongoing family issues that patient is working through. Due to high level of stress, majority of session spent on soft tissue work of cervical musculature. Pt continues to experience increased RUE pain within 120 to 60 degrees of flexion that has not subsided since the beginning of PT. She did note a reducation in NPS in right shoulder at the end of session. She will continue to benefit from skilled PT to reduce upper trap guarding, increase right shoulder ROM and decrease right shoulder pain to regain use of right arm for overhead tasks such as grooming and reaching for objects in cabinets for meal prep.    Personal Factors and Comorbidities Age;Time since onset of  injury/illness/exacerbation;Comorbidity 3+;Education;Fitness;Profession    Comorbidities Anemia, anxiety, GERD, lumbar radiculopathy, Polycystic ovarian syndrome, B plantar fasciitis.    Examination-Activity Limitations Reach Overhead;Carry;Dressing;Sleep;Sit;Transfers;Toileting;Stand    Examination-Participation Restrictions Community Activity;Driving;Occupation;Yard Work    Conservation officer, historic buildingstability/Clinical Decision Making Evolving/Moderate complexity  Clinical Decision Making Low    Rehab Potential Good    PT Frequency 2x / week    PT Duration 8 weeks    PT Treatment/Interventions ADLs/Self Care Home Management;Aquatic Therapy;Cryotherapy;Electrical Stimulation;Moist Heat;Traction;Gait training;Functional mobility training;Therapeutic activities;Patient/family education;Therapeutic exercise;Balance training;Neuromuscular re-education;Manual techniques;Passive range of motion;Dry needling;Spinal Manipulations;Joint Manipulations    PT Next Visit Plan Parascapular Soft Tisuse Work. Along with parascapular strengthening exercises. Cervical mobilizations?    PT Home Exercise Plan Access Code: [email or web address]    Consulted and Agree with Plan of Care Patient             Patient will benefit from skilled therapeutic intervention in order to improve the following deficits and impairments:  Pain, Decreased mobility, Increased muscle spasms, Postural dysfunction, Decreased activity tolerance, Decreased range of motion, Decreased strength  Visit Diagnosis: Acute pain of right shoulder  Muscle weakness (generalized)     Problem List Patient Active Problem List   Diagnosis Date Noted   Vitamin D deficiency 06/08/2020   Abnormal uterine bleeding 12/02/2019   Screening for STD (sexually transmitted disease) 10/19/2018   Encounter for birth control 03/23/2018   GERD (gastroesophageal reflux disease) 03/23/2018   Preventative health care 03/23/2018   Vitamin B12 deficiency due to intestinal malabsorption  12/16/2017   GAD (generalized anxiety disorder) 11/25/2017   S/P gastric bypass 03/17/2017   Ellin Goodie PT, DPT  01/31/2021, 4:00 PM  [facility] [address], [place], [address] Phone: [phone]   Fax:  [phone]  Name: Crystal Diaz MRN: [id number] Date of Birth: Jan 04, 1991

## 2021-01-31 NOTE — Progress Notes (Signed)
[place] Counselor/Therapist Progress Note  Patient ID: Crystal Diaz, MRN: [id number],    Date: 01/31/2021  Time Spent: 50 mins  Treatment Type: Individual Therapy  Reported Symptoms: Pt presented for a follow up session, via webex video, due to the virus outbreak.  Pt granted consent for the session, stating that she is in her car in a parking lot with no one else present.  I shared with pt that I am in my office at home with no one else here either.  Previous pt of [name]; saw her for about 3 yrs.  Pt shares her shoulder is getting some better but she still has some healing to do.  She is scheduled for a PT appt right after our appt..   Mental Status Exam: Appearance:  Casual     Behavior: Appropriate  Motor: Normal  Speech/Language:  Clear and Coherent  Affect: Appropriate  Mood: normal  Thought process: normal  Thought content:   WNL  Sensory/Perceptual disturbances:   WNL  Orientation: oriented to person, place, time/date, and situation  Attention: Good  Concentration: Good  Memory: WNL  Fund of knowledge:  Good  Insight:   Good  Judgment:  Good  Impulse Control: Good   Risk Assessment: Danger to Self:  No Self-injurious Behavior: No Danger to Others: No Duty to Warn:no Physical Aggression / Violence:No  Access to Firearms a concern: No  Gang Involvement:No   Subjective: Pt shares that she learned on Monday that 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  Pt shares, "I am completely distraught about this loss."  Pt is tearful about this news.  She has a 30 day notice.  She is in a hard position because [employer] has a hiring freeze for non-patient facing positions.  Pt also shares that she "is so hurt by these decisions."  Pt, in tears, says "I just want to call my mom; I need my mom (who is deceased)."  Pt denies any suicidal ideation, plan, or intent.  "I have never been in a place where I had to find a job, like I  am now."  We talked about options inside of [employer]; she is working with a person from [place] to try find another job within [employer].   Encouraged pt to try to stay in the organization if she can and we talked through the benefits of staying.  Pt is able to calm down in moment and she acknowledges "I am so accountable and I need to be accountable through this situation."  Need to ask pt about [name] [address] date on 02/05/21 at our next session.  Encouraged pt to continue with her self care activities and we will meet next week for a follow up session.    Interventions: Cognitive Behavioral Therapy  Diagnosis:Adjustment disorder with mixed anxiety and depressed mood  Plan: Treatment Plan Strengths/Abilities:  Intelligent, Intuitive, Willing to participate in therapy Treatment Preferences:  Outpatient Individual Therapy Statement of Needs:  Patient is to use CBT, mindfulness and coping skills to help manage and/or decrease symptoms associated with their diagnosis. Symptoms:  Depressed/Irritable mood, worry, social withdrawal Problems Addressed:  Depressive thoughts, Sadness, Sleep issues, etc. Long Term Goals:  Pt to reduce overall level, frequency, and intensity of the feelings of depression/anxiety as evidenced by decreased irritability, negative self talk, and helpless feelings from 6 to 7 days/week to 0 to 1 days/week, per client report, for at least 3 consecutive months.  Progress:  20% Short Term Goals:  Pt to verbally express understanding of the relationship between feelings of depression/anxiety and their impact on thinking patterns and behaviors.  Pt to verbalize an understanding of the role that distorted thinking plays in creating fears, excessive worry, and ruminations.  Progress: 20% Target Date:  12/28/2021 Frequency:  Bi-weekly Modality:  Cognitive Behavioral Therapy Interventions by Therapist:  Therapist will use CBT, Mindfulness exercises, Coping skills and Referrals, as needed by  client. Client has verbally approved this treatment plan.  Ivan Anchors, [facility]

## 2021-02-04 ENCOUNTER — Ambulatory Visit: Payer: PRIVATE HEALTH INSURANCE

## 2021-02-04 DIAGNOSIS — M25511 Pain in right shoulder: Secondary | ICD-10-CM

## 2021-02-04 DIAGNOSIS — M6281 Muscle weakness (generalized): Secondary | ICD-10-CM

## 2021-02-04 NOTE — Therapy (Signed)
[facility] [address], [place], [address] Phone: [phone]   Fax:  [phone]  Physical Therapy Treatment  Patient Details  Name: Crystal Diaz MRN: [id number] Date of Birth: [birth year] No data recorded  Encounter Date: 02/04/2021   PT End of Session - 02/04/21 1601     Visit Number 7    Number of Visits 12    Date for PT Re-Evaluation 03/12/21    Authorization Type Worker's Comp: approved for 1 evaluation and 12 treatments    Authorization Time Period Cert [id number]    Authorization - Visit Number 6    Authorization - Number of Visits 12    Progress Note Due on Visit 10    PT Start Time [id number]    PT Stop Time 1628    PT Time Calculation (min) 40 min    Activity Tolerance Patient tolerated treatment well;No increased pain    Behavior During Therapy WFL for tasks assessed/performed             Past Medical History:  Diagnosis Date   Anemia    Anxiety    GERD (gastroesophageal reflux disease)    Headache    Lumbar radiculopathy    PCOS (polycystic ovarian syndrome)    Plantar fasciitis, bilateral     Past Surgical History:  Procedure Laterality Date   FOOT SURGERY Right    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   Procedure: LAPAROSCOPIC ROUX-EN-Y GASTRIC BYPASS WITH HIATAL HERNIA REPAIR AND UPPER ENDOSCOPY;  Surgeon: Johnathan Hausen, MD;  Location: [facility];  Service: General;  Laterality: N/A;   WISDOM TOOTH EXTRACTION      There were no vitals filed for this visit.   Subjective Assessment - 02/04/21 1551     Subjective No medical/medication updates since past session. Pt still working on HEP with success. Pt still has intermittent N/T in Rt hand volar only, still has subjective tension in Rt neck particularly with stretch or Rt neck tissue. Burning sensation in Rt shoulder area is constant, worse with attempted shoulder flexion.    Pertinent History Pt is a 31 y.o. female referred to  OPPT for R shoulder/arm/ and periscapular pain. PMH includes: Anemia, anxiety, GERD, lumbar radiculopathy, Polycystic ovarian syndrome, B plantar fasciitis. Pt reports onset of ~3 weeks ago with N/T down into RUE and hand. 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 MOI reported as an upward pull on R shoulder in axillary region causing GHJ elevation. Does reports symptoms as burning, N/T, throbbing at times in shoulder/periscapular region. Demonstrates tightness with cervial mobility but no reproduction of symptoms down RLE. typically hovers at 4/10 NPS, 7/10 NPS is worst. Currently a 6/10 NPS. Reports pain along posterior, lateral RUE onto dorsal R hand along4th and 5th finger. Reports symptoms improved with heat, prednisone. Pain is worse with shoulder mobility with getting dressed, driving, with RUE in dependent position worsens symptoms. Pt's goal is to improve shoulder pain, N/T symptoms.    Currently in Pain? Yes    Pain Score 4    posterior shoulder   Pain Location --   pulling dyscomfort            INTERVENTION: -table slides flexion x20 -assisted arm RUE neural tension screening: no change burning sensation with cervical lateral flexion, nor with wrist extension  - MFR to Rt scalenes, first rib, upper trap -  Manual trigger poitn stretch to Rt pec minor 3x60sec -P/ROM RT scapular retraction stretch in supine x60sec  -RUE bench press supine, unloaded x15 -iso,metric BUE shoulder extension into table 1x15x5secH -Cervical retraction into pillow 1x10x3secH (starting in slight flexion)      PT Education - 02/04/21 1601     Education Details Self care and regulation with HEP    Person(s) Educated Patient    Methods Explanation;Demonstration    Comprehension Verbalized understanding              PT Short Term Goals - 01/31/21 1557       PT SHORT TERM GOAL #1   Title Pt  will be independent with HEP to improve pain, AROM, and strength to return to PLOF.    Baseline 01/16/2020: initiated    Time 4    Period Weeks    Status On-going    Target Date 02/12/21               PT Long Term Goals - 01/31/21 1557       PT LONG TERM GOAL #1   Title Pt will improve FOTO score to target to demo clinically significant improvement in functional mobility.    Baseline 01/16/20: 41 with target of 60    Time 8    Period Weeks    Status On-going    Target Date 03/12/21      PT LONG TERM GOAL #2   Title Pt will improve shoulder AROM to at least >160 degrees to demo ability to perform overhead ADL's.    Baseline 1/3: 91 deg elevation, 86 abduction    Time 8    Period Weeks    Status On-going    Target Date 03/12/21      PT LONG TERM GOAL #3   Title Pt will improve pain to < 5/10 NPS to shoulder motion to demo improved pain with functional mobility for ADL completion.    Baseline 01/15/21: Up to 7/10 NPS with shoulder motion    Time 8    Period Weeks    Status On-going    Target Date 03/12/21                   Plan - 02/04/21 1620     Clinical Impression Statement Continued with POC from prior sessions and evaluation. 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 Will continue to screen moving forward for best approach- regardless, pt showing signs of improvement overall with current approach.    Personal Factors and Comorbidities Age;Time since onset of injury/illness/exacerbation;Comorbidity 3+;Education;Fitness;Profession    Comorbidities Anemia, anxiety, GERD, lumbar radiculopathy, Polycystic ovarian syndrome, B plantar fasciitis.    Examination-Activity Limitations Reach Overhead;Carry;Dressing;Sleep;Sit;Transfers;Toileting;Stand    Examination-Participation  Restrictions Community Activity;Driving;Occupation;Yard Work    Biomedical scientist Low    Rehab Potential Good    PT Frequency 2x / week    PT Duration 8 weeks    PT Treatment/Interventions ADLs/Self Care Home Management;Aquatic Therapy;Cryotherapy;Electrical Stimulation;Moist Heat;Traction;Gait training;Functional mobility training;Therapeutic activities;Patient/family education;Therapeutic exercise;Balance training;Neuromuscular re-education;Manual techniques;Passive range of motion;Dry needling;Spinal Manipulations;Joint Manipulations    PT Next Visit Plan revisit Rt scalenes and Rt pec Minor work, scapular strengthening    PT Home Exercise Plan Access Code: [email or web address]    Consulted and Agree with Plan of Care Patient  Patient will benefit from skilled therapeutic intervention in order to improve the following deficits and impairments:  Pain, Decreased mobility, Increased muscle spasms, Postural dysfunction, Decreased activity tolerance, Decreased range of motion, Decreased strength  Visit Diagnosis: Acute pain of right shoulder  Muscle weakness (generalized)     Problem List Patient Active Problem List   Diagnosis Date Noted   Vitamin D deficiency 06/08/2020   Abnormal uterine bleeding 12/02/2019   Screening for STD (sexually transmitted disease) 10/19/2018   Encounter for birth control 03/23/2018   GERD (gastroesophageal reflux disease) 03/23/2018   Preventative health care 03/23/2018   Vitamin B12 deficiency due to intestinal malabsorption 12/16/2017   GAD (generalized anxiety disorder) 11/25/2017   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, [place], [address] Phone: [phone]   Fax:  [phone]  Name: Crystal Diaz MRN: [id number] Date of Birth: 11-Oct-1990

## 2021-02-05 ENCOUNTER — Encounter: Payer: Self-pay | Admitting: Obstetrics and Gynecology

## 2021-02-05 ENCOUNTER — Other Ambulatory Visit: Payer: Self-pay

## 2021-02-05 ENCOUNTER — Ambulatory Visit (INDEPENDENT_AMBULATORY_CARE_PROVIDER_SITE_OTHER): Payer: No Typology Code available for payment source | Admitting: Obstetrics and Gynecology

## 2021-02-05 VITALS — BP 129/79 | HR 90 | Ht 69.0 in | Wt 230.0 lb

## 2021-02-05 DIAGNOSIS — Z3009 Encounter for other general counseling and advice on contraception: Secondary | ICD-10-CM

## 2021-02-05 MED ORDER — DESOGESTREL-ETHINYL ESTRADIOL 0.15-0.02/0.01 MG (21/5) PO TABS
1.0000 | ORAL_TABLET | Freq: Every day | ORAL | 2 refills | Status: DC
  Filled 2021-02-05 – 2021-04-11 (×2): qty 84, 84d supply, fill #0
  Filled 2021-07-08: qty 84, 84d supply, fill #1

## 2021-02-05 NOTE — Progress Notes (Signed)
HPI:      [name] is a 31 y.o. G1P1001 who LMP was Patient's last menstrual period was 12/28/2020 (approximate).  Subjective:   She presents today for follow-up after beginning OCPs after IUD removal.  She says she is doing well.  She occasionally misses a pill but has doubled up.  She reports her periods are very light.  She would like to continue OCPs. She is not sure when her last Pap smear was.    Hx: The following portions of the patient's history were reviewed and updated as appropriate:             She  has a past medical history of Anemia, Anxiety, GERD (gastroesophageal reflux disease), Headache, Lumbar radiculopathy, PCOS (polycystic ovarian syndrome), and Plantar fasciitis, bilateral. She does not have any pertinent problems on file. 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 Her family history includes Cancer in her brother; Diabetes in her mother; Pancreatic cancer (age of onset: 82) in her father. She  reports that she quit smoking about 4 years ago. Her smoking use included cigarettes. She has a 1.00 pack-year smoking history. She has never used smokeless tobacco. She reports current alcohol use. She reports that she does not use drugs. She has a current medication list which includes the following prescription(s): cyanocobalamin, gabapentin, pantoprazole, pantoprazole, syringe/needle (disp) 1 ml, venlafaxine xr, vitamin d (ergocalciferol), chlorzoxazone, desogestrel-ethinyl estradiol, and prednisone. She has No Known Allergies.       Review of Systems:  Review of Systems  Constitutional: Denied constitutional symptoms, night sweats, recent illness, fatigue, fever, insomnia and weight loss.  Eyes: Denied eye symptoms, eye pain, photophobia, vision change and visual disturbance.  Ears/Nose/Throat/Neck: Denied ear, nose, throat or neck symptoms, hearing loss, nasal discharge,  sinus congestion and sore throat.  Cardiovascular: Denied cardiovascular symptoms, arrhythmia, chest pain/pressure, edema, exercise intolerance, orthopnea and palpitations.  Respiratory: Denied pulmonary symptoms, asthma, pleuritic pain, productive sputum, cough, dyspnea and wheezing.  Gastrointestinal: Denied, gastro-esophageal reflux, melena, nausea and vomiting.  Genitourinary: Denied genitourinary symptoms including symptomatic vaginal discharge, pelvic relaxation issues, and urinary complaints.  Musculoskeletal: Denied musculoskeletal symptoms, stiffness, swelling, muscle weakness and myalgia.  Dermatologic: Denied dermatology symptoms, rash and scar.  Neurologic: Denied neurology symptoms, dizziness, headache, neck pain and syncope.  Psychiatric: Denied psychiatric symptoms, anxiety and depression.  Endocrine: Denied endocrine symptoms including hot flashes and night sweats.   Meds:   Current Outpatient Medications on File Prior to Visit  Medication Sig Dispense Refill   cyanocobalamin (,VITAMIN B-12,) 1000 MCG/ML injection INJECT 1 ML (1,000 MCG TOTAL) INTO THE MUSCLE EVERY 30 (THIRTY) DAYS. 3 mL 1   gabapentin (NEURONTIN) 300 MG capsule Take 1 capsule by mouth at bedtime Take 1 capsule at night x 3 days; then increase to 1 capsule twice daily x 3 days; then increase to 1 capsule three times a day 90 capsule 0   pantoprazole (PROTONIX) 40 MG tablet Take 1 tablet (40 mg total) by mouth daily. 90 tablet 3   pantoprazole (PROTONIX) 40 MG tablet Take 1 tablet by mouth daily. 30 tablet 11   SYRINGE/NEEDLE, DISP, 1 ML 23G X 1" 1 ML MISC Use once monthly with vitamin B12 vial. 12 each 0   venlafaxine XR (EFFEXOR XR) 37.5 MG 24 hr capsule Take 1 capsule (37.5 mg total) by mouth daily with breakfast for anxiety 90 capsule 1   Vitamin D, Ergocalciferol, (DRISDOL) 1.25  MG (50000 UNIT) CAPS capsule Take 1 capsule (50,000 Units total) by mouth once a week. 12 capsule 1   chlorzoxazone (PARAFON) 500  MG tablet Take one tablet by mouth every evening and at bedtime or every 6 hours as needed for spasm pain (Patient not taking: Reported on 02/05/2021) 20 tablet 0   predniSONE (DELTASONE) 20 MG tablet Take 2 tablets by mouth daily with food (Patient not taking: Reported on 02/05/2021) 10 tablet 0   No current facility-administered medications on file prior to visit.      Objective:     Vitals:   02/05/21 0853  BP: 129/79  Pulse: 90   Filed Weights   02/05/21 0853  Weight: 230 lb (104.3 kg)                        Assessment:    G1P1001 Patient Active Problem List   Diagnosis Date Noted   Vitamin D deficiency 06/08/2020   Abnormal uterine bleeding 12/02/2019   Screening for STD (sexually transmitted disease) 10/19/2018   Encounter for birth control 03/23/2018   GERD (gastroesophageal reflux disease) 03/23/2018   Preventative health care 03/23/2018   Vitamin B12 deficiency due to intestinal malabsorption 12/16/2017   GAD (generalized anxiety disorder) 11/25/2017   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 Birth control counseling     Patient doing well on OCPs.  She would like to continue.   Plan:            1.  Have counseled her regarding missing pills.  Possible fertility discussed if she misses pills.  2.  Patient to return in 6 months for annual examination and Pap smear. Orders No orders of the defined types were placed in this encounter.    Meds ordered this encounter  Medications   desogestrel-ethinyl estradiol (MIRCETTE) 0.15-0.02/0.01 MG (21/5) tablet    Sig: Take 1 tablet by mouth daily.    Dispense:  84 tablet    Refill:  2      F/U  Return in about 6 months (around 08/05/2021) for Annual Physical. I spent 21 minutes involved in the care of this patient preparing to see the patient by obtaining and reviewing her medical history (including labs, imaging tests and prior procedures), documenting clinical information in the electronic health record (EHR),  counseling and coordinating care plans, writing and sending prescriptions, ordering tests or procedures and in direct communicating with the patient and medical staff discussing pertinent items from her history and physical exam.  Finis Bud, M.D. 02/05/2021 9:07 AM

## 2021-02-06 ENCOUNTER — Ambulatory Visit: Payer: PRIVATE HEALTH INSURANCE

## 2021-02-06 ENCOUNTER — Other Ambulatory Visit: Payer: Self-pay

## 2021-02-06 DIAGNOSIS — M25511 Pain in right shoulder: Secondary | ICD-10-CM | POA: Diagnosis not present

## 2021-02-06 DIAGNOSIS — M6281 Muscle weakness (generalized): Secondary | ICD-10-CM

## 2021-02-06 NOTE — Therapy (Signed)
[facility] [address] [place], [address] Phone: [phone]   Fax:  [phone]  Physical Therapy Treatment  Patient Details  Name: Crystal Diaz MRN: [id number] Date of Birth: [birth year] No data recorded  Encounter Date: 02/06/2021   PT End of Session - 02/06/21 1619     Visit Number 8    Number of Visits 12    Date for PT Re-Evaluation 03/12/21    Authorization Type Worker's Comp: approved for 1 evaluation and 12 treatments    Authorization Time Period Cert 09/17/15-04/20/12    Authorization - Visit Number 7    Authorization - Number of Visits 12    Progress Note Due on Visit 10    PT Start Time 1337    PT Stop Time 1415    PT Time Calculation (min) 38 min    Activity Tolerance Patient tolerated treatment well;No increased pain    Behavior During Therapy WFL for tasks assessed/performed             Past Medical History:  Diagnosis Date   Anemia    Anxiety    GERD (gastroesophageal reflux disease)    Headache    Lumbar radiculopathy    PCOS (polycystic ovarian syndrome)    Plantar fasciitis, bilateral     Past Surgical History:  Procedure Laterality Date   FOOT SURGERY Right    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   Procedure: LAPAROSCOPIC ROUX-EN-Y GASTRIC BYPASS WITH HIATAL HERNIA REPAIR AND UPPER ENDOSCOPY;  Surgeon: Luretha Murphy, MD;  Location: [facility];  Service: General;  Laterality: N/A;   WISDOM TOOTH EXTRACTION      There were no vitals filed for this visit.   Subjective Assessment - 02/06/21 1339     Subjective Pt doing fine today, worse pain after less session, but improved with heat. Still somewhat flared after last session.Heat continued to help quite abit.    Pertinent History Pt is a 31 y.o. female referred to OPPT for R shoulder/arm/ and periscapular pain. PMH includes: Anemia, anxiety, GERD, lumbar radiculopathy, Polycystic ovarian syndrome, B plantar fasciitis. Pt  reports onset of ~3 weeks ago with N/T down into RUE and hand. 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 MOI reported as an upward pull on R shoulder in axillary region causing GHJ elevation. Does reports symptoms as burning, N/T, throbbing at times in shoulder/periscapular region. Demonstrates tightness with cervial mobility but no reproduction of symptoms down RLE. typically hovers at 4/10 NPS, 7/10 NPS is worst. Currently a 6/10 NPS. Reports pain along posterior, lateral RUE onto dorsal R hand along4th and 5th finger. Reports symptoms improved with heat, prednisone. Pain is worse with shoulder mobility with getting dressed, driving, with RUE in dependent position worsens symptoms. Pt's goal is to improve shoulder pain, N/T symptoms.    Currently in Pain? Yes    Pain Score 6    posterior shoulder               [facility] PT Assessment - 02/06/21 0001       ROM / Strength   AROM / PROM / Strength AROM;PROM;Strength      AROM   AROM Assessment Site Cervical    Cervical Flexion --   able to bring chin to sternum fully, no tightness   Cervical Extension 45    Cervical - Right Side Bend  3   WNL   Cervical - Left Side Bend 34   prolonged hold escalates burning in Rt UT distribution   Cervical - Right Rotation 70    Cervical - Left Rotation 60      PROM   PROM Assessment Site Shoulder    Right/Left Shoulder Right;Left    Right Shoulder Internal Rotation 45 Degrees    painful   Right Shoulder External Rotation 78 Degrees    Left Shoulder Internal Rotation 54 Degrees    Left Shoulder External Rotation 84 Degrees      Strength   Strength Assessment Site Shoulder    Right/Left Shoulder Right;Left    Right Shoulder Internal Rotation 5/5    Right Shoulder External Rotation 4+/5     Right Shoulder Horizontal ABduction 4/5    4+/5 posterior deltoid (exacerbates burning sensation); 4/5  middle trapezius   Left Shoulder Internal Rotation 5/5    Left Shoulder External Rotation 5/5    Left Shoulder Horizontal ABduction 5/5   5/5 posterio deltoid, 4+/5 middle trapezius     Palpation   Palpation comment allodynia at infraspinatus, upper trapezius, infraspinatus                     PT Education - 02/06/21 1618     Education Details Differential diagnosis and potential need for additional diagnostics to better guide interventions moving forard    Person(s) Educated Patient    Methods Explanation;Demonstration    Comprehension Verbalized understanding              PT Short Term Goals - 01/31/21 1557       PT SHORT TERM GOAL #1   Title Pt will be independent with HEP to improve pain, AROM, and strength to return to PLOF.    Baseline 01/16/2020: initiated    Time 4    Period Weeks    Status On-going    Target Date 02/12/21               PT Long Term Goals - 01/31/21 1557       PT LONG TERM GOAL #1   Title Pt will improve FOTO score to target to demo clinically significant improvement in functional mobility.    Baseline 01/16/20: 41 with target of 60    Time 8    Period Weeks    Status On-going    Target Date 03/12/21      PT LONG TERM GOAL #2   Title Pt will improve shoulder AROM to at least >160 degrees to demo ability to perform overhead ADL's.    Baseline 1/3: 91 deg elevation, 86 abduction    Time 8    Period Weeks    Status On-going    Target Date 03/12/21      PT LONG TERM GOAL #3   Title Pt will improve pain to < 5/10 NPS to shoulder motion to demo improved pain with functional mobility for ADL completion.    Baseline 01/15/21: Up to 7/10 NPS with shoulder motion    Time 8    Period Weeks    Status On-going    Target Date 03/12/21                   Plan - 02/06/21 1630     Clinical Impression Statement Pt reassessed this date to ascertain improvements in ROM, strength and limb movement tolerance. Pt remains weak,  painful, and allodynic in the Rt posterior scapular region,  continues to have Rt posterior neck pulling sensation with when moving head/neck away from scapula. Discussed initital MD assessment remarking on brachial plexus injury and latter MD assessment more centered to rotator cuff pathology, author feels more diagnosic workup would help guide future interventions going forward due to the significant difference in nature of these injuries, additionally, if plexus injury is primary MOI, location of lesion would be of utmost relevance. 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    Personal Factors and Comorbidities Age;Time since onset of injury/illness/exacerbation;Comorbidity 3+;Education;Fitness;Profession    Comorbidities Anemia, anxiety, GERD, lumbar radiculopathy, Polycystic ovarian syndrome, B plantar fasciitis.    Examination-Activity Limitations Reach Overhead;Carry;Dressing;Sleep;Sit;Transfers;Toileting;Stand    Examination-Participation Restrictions Community Activity;Driving;Occupation;Yard Work    Public house managertability/Clinical Decision Making Evolving/Moderate complexity    Clinical Decision Making Low    Rehab Potential Good    PT Frequency 2x / week    PT Duration 8 weeks    PT Treatment/Interventions ADLs/Self Care Home Management;Aquatic Therapy;Cryotherapy;Electrical Stimulation;Moist Heat;Traction;Gait training;Functional mobility training;Therapeutic activities;Patient/family education;Therapeutic exercise;Balance training;Neuromuscular re-education;Manual techniques;Passive range of motion;Dry needling;Spinal Manipulations;Joint Manipulations    PT Next Visit Plan revisit isolated shoulder strengthening as tolerated (potentitally isometrics as to avoid undue plexus stretch in a state of healing)    PT Home Exercise Plan Access Code: [email or web address]    Consulted and Agree with Plan of Care Patient             Patient will benefit from skilled therapeutic intervention in  order to improve the following deficits and impairments:  Pain, Decreased mobility, Increased muscle spasms, Postural dysfunction, Decreased activity tolerance, Decreased range of motion, Decreased strength  Visit Diagnosis: Acute pain of right shoulder  Muscle weakness (generalized)     Problem List Patient Active Problem List   Diagnosis Date Noted   Vitamin D deficiency 06/08/2020   Abnormal uterine bleeding 12/02/2019   Screening for STD (sexually transmitted disease) 10/19/2018   Encounter for birth control 03/23/2018   GERD (gastroesophageal reflux disease) 03/23/2018   Preventative health care 03/23/2018   Vitamin B12 deficiency due to intestinal malabsorption 12/16/2017   GAD (generalized anxiety disorder) 11/25/2017   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 [place], [place], [address] Phone: [phone]   Fax:  [phone]  Name: Alvin CritchleySherika M Strohman MRN: [id number] Date of Birth: [birth year]

## 2021-02-07 ENCOUNTER — Ambulatory Visit (INDEPENDENT_AMBULATORY_CARE_PROVIDER_SITE_OTHER): Payer: No Typology Code available for payment source | Admitting: Psychology

## 2021-02-07 DIAGNOSIS — F4323 Adjustment disorder with mixed anxiety and depressed mood: Secondary | ICD-10-CM

## 2021-02-07 NOTE — Progress Notes (Signed)
[facility] Counselor/Therapist Progress Note  Patient ID: Crystal Diaz, MRN: [id number],    Date: 02/07/2021  Time Spent: 50 mins  Treatment Type: Individual Therapy  Reported Symptoms: Pt presented for a follow up session, via webex video, due to the virus outbreak.  Pt granted consent for the session, stating that she is in her car with no one else present.  I shared with pt that I am in my office at home with no one else here either.  Previous pt of Crystal Diaz; saw her for about 3 yrs.    Mental Status Exam: Appearance:  Casual     Behavior: Appropriate  Motor: Normal  Speech/Language:  Clear and Coherent  Affect: Appropriate  Mood: normal  Thought process: normal  Thought content:   WNL  Sensory/Perceptual disturbances:   WNL  Orientation: oriented to person, place, time/date, and situation  Attention: Good  Concentration: Good  Memory: WNL  Fund of knowledge:  Good  Insight:   Good  Judgment:  Good  Impulse Control: Good   Risk Assessment: Danger to Self:  No Self-injurious Behavior: No Danger to Others: No Duty to Warn:no Physical Aggression / Violence:No  Access to Firearms a concern: No  Gang Involvement:No   Subjective: Pt shares that her great aunt has been staying with pt since Nov; it is helpful for pt and it is also another thing for pt to deal with.  Pt shares that her PT is going well but she is still having some pain in her shoulder; she goes back to see the doctor tomorrow.  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  Pt is excited about this opportunity to interview.  Pt is in a much better emotional space today than she was last week.  She is now specifically focused on her future now, rather than being caught in the disappointment of the loss of her job.  Pt shares that her son Crystal Diaz) is doing well in school right now; she shares he just started a new course of Crystal Diaz  for his ADHD and he is being more accountable in school.  Pt shares that Crystal Diaz had a court date on 1/24 and the judge granted her request for guardianship for Crystal Diaz.  She is hopeful that she can now get appropriate MH services for him.  Pt shares that she got her hair done and got a pedicure last week as well.  Encouraged pt to continue with her self care activities and we will meet next week for a follow up session.  Interventions: Cognitive Behavioral Therapy  Diagnosis:Adjustment disorder with mixed anxiety and depressed mood  Plan: Treatment Plan Strengths/Abilities:  Intelligent, Intuitive, Willing to participate in therapy Treatment Preferences:  Outpatient Individual Therapy Statement of Needs:  Patient is to use CBT, mindfulness and coping skills to help manage and/or decrease symptoms associated with their diagnosis. Symptoms:  Depressed/Irritable mood, worry, social withdrawal Problems Addressed:  Depressive thoughts, Sadness, Sleep issues, etc. Long Term Goals:  Pt to reduce overall level, frequency, and intensity of the feelings of depression/anxiety as evidenced by decreased irritability, negative self talk, and helpless feelings from 6 to 7 days/week to 0 to 1 days/week, per client report, for at least 3 consecutive months.  Progress: 20% Short Term Goals:  Pt to verbally express understanding of the relationship between feelings of depression/anxiety and their impact on thinking patterns and behaviors.  Pt to verbalize an understanding  of the role that distorted thinking plays in creating fears, excessive worry, and ruminations.  Progress: 20% Target Date:  12/28/2021 Frequency:  Bi-weekly Modality:  Cognitive Behavioral Therapy Interventions by Therapist:  Therapist will use CBT, Mindfulness exercises, Coping skills and Referrals, as needed by client. Client has verbally approved this treatment plan.  Crystal Diaz, [facility]

## 2021-02-11 ENCOUNTER — Ambulatory Visit: Payer: PRIVATE HEALTH INSURANCE

## 2021-02-11 ENCOUNTER — Other Ambulatory Visit: Payer: Self-pay

## 2021-02-11 DIAGNOSIS — M25511 Pain in right shoulder: Secondary | ICD-10-CM | POA: Diagnosis not present

## 2021-02-11 DIAGNOSIS — M6281 Muscle weakness (generalized): Secondary | ICD-10-CM

## 2021-02-11 NOTE — Therapy (Signed)
[facility] [address] [place], [address] Phone: [phone]   Fax:  [phone]  Physical Therapy Treatment  Patient Details  Name: Crystal Diaz MRN: [id number] Date of Birth: May 26, 1990 No data recorded  Encounter Date: 02/11/2021   PT End of Session - 02/11/21 1000     Visit Number 9    Number of Visits 12    Date for PT Re-Evaluation 03/12/21    Authorization Type Worker's Comp: approved for 1 evaluation and 12 treatments    Authorization Time Period Cert 03/19/60-09/12/49    Authorization - Visit Number 8    Authorization - Number of Visits 12    Progress Note Due on Visit 10    PT Start Time 0941   pt running late from appointment   PT Stop Time 1010    PT Time Calculation (min) 29 min    Activity Tolerance Patient tolerated treatment well;No increased pain    Behavior During Therapy WFL for tasks assessed/performed             Past Medical History:  Diagnosis Date   Anemia    Anxiety    GERD (gastroesophageal reflux disease)    Headache    Lumbar radiculopathy    PCOS (polycystic ovarian syndrome)    Plantar fasciitis, bilateral     Past Surgical History:  Procedure Laterality Date   FOOT SURGERY Right    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   Procedure: LAPAROSCOPIC ROUX-EN-Y GASTRIC BYPASS WITH HIATAL HERNIA REPAIR AND UPPER ENDOSCOPY;  Surgeon: Luretha Murphy, MD;  Location: [facility];  Service: General;  Laterality: N/A;   WISDOM TOOTH EXTRACTION      There were no vitals filed for this visit.   Subjective Assessment - 02/11/21 0945     Subjective Pt saw orthopedics on Friday, plan update for MRI of Rt shoulder and EMG study. Orthopedics wants to conitnue PT as well. Pt reports some pain 4/10 in right shoulder, still burning, fairly stable symptoms over weekend.    Pertinent History Pt is a 31 y.o. female referred to OPPT for R shoulder/arm/ and periscapular pain. PMH includes:  Anemia, anxiety, GERD, lumbar radiculopathy, Polycystic ovarian syndrome, B plantar fasciitis. Pt reports onset of ~3 weeks ago with N/T down into RUE and hand. 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 MOI reported as an upward pull on R shoulder in axillary region causing GHJ elevation. Does reports symptoms as burning, N/T, throbbing at times in shoulder/periscapular region. Demonstrates tightness with cervial mobility but no reproduction of symptoms down RLE. typically hovers at 4/10 NPS, 7/10 NPS is worst. Currently a 6/10 NPS. Reports pain along posterior, lateral RUE onto dorsal R hand along4th and 5th finger. Reports symptoms improved with heat, prednisone. Pain is worse with shoulder mobility with getting dressed, driving, with RUE in dependent position worsens symptoms. Pt's goal is to improve shoulder pain, N/T symptoms.    Limitations House hold activities;Lifting;Standing;Walking    Currently in Pain? Yes    Pain Score 4     Pain Orientation --   Rt shoulder           INTERVENTION THIS DATE:  -AA/ROM on Nustep, seat 13, arms 14, level 1, towel behind upper trunk; 5 minutes  -seated, arms crossed, cervical rotation A/ROM in tolerated range 1x15 each direction alternating sides -seated arms crossed  cervical flexion extension ROM x15 each, tolerated range -seated isometric shoulder row into plinth 20x3secH  -seated arm abdct 90 degrees ER at 0 isometric 15x30secH  -RUE standing row double red band 1x15  -seated RUE resisted ER 0-80 degrees 3lb FW   PT Education - 02/11/21 0953     Education Details gentle ROM avoiding aggravation    Person(s) Educated Patient    Methods Explanation;Demonstration    Comprehension Verbalized understanding              PT Short Term Goals - 01/31/21 1557       PT SHORT TERM GOAL #1   Title Pt will be independent with HEP to improve  pain, AROM, and strength to return to PLOF.    Baseline 01/16/2020: initiated    Time 4    Period Weeks    Status On-going    Target Date 02/12/21               PT Long Term Goals - 01/31/21 1557       PT LONG TERM GOAL #1   Title Pt will improve FOTO score to target to demo clinically significant improvement in functional mobility.    Baseline 01/16/20: 41 with target of 60    Time 8    Period Weeks    Status On-going    Target Date 03/12/21      PT LONG TERM GOAL #2   Title Pt will improve shoulder AROM to at least >160 degrees to demo ability to perform overhead ADL's.    Baseline 1/3: 91 deg elevation, 86 abduction    Time 8    Period Weeks    Status On-going    Target Date 03/12/21      PT LONG TERM GOAL #3   Title Pt will improve pain to < 5/10 NPS to shoulder motion to demo improved pain with functional mobility for ADL completion.    Baseline 01/15/21: Up to 7/10 NPS with shoulder motion    Time 8    Period Weeks    Status On-going    Target Date 03/12/21                   Plan - 02/11/21 1002     Clinical Impression Statement Gentle ROM of neck and shoulder and variable low to moderate activation of posterior shoulder. Session tolerate dwell in general, avoiding frank exacerbation of symptoms.    Personal Factors and Comorbidities Age;Time since onset of injury/illness/exacerbation;Comorbidity 3+;Education;Fitness;Profession    Comorbidities Anemia, anxiety, GERD, lumbar radiculopathy, Polycystic ovarian syndrome, B plantar fasciitis.    Examination-Activity Limitations Reach Overhead;Carry;Dressing;Sleep;Sit;Transfers;Toileting;Stand    Examination-Participation Restrictions Community Activity;Driving;Occupation;Yard Work    Public house manager Low    Rehab Potential Good    PT Frequency 2x / week    PT Duration 8 weeks    PT Treatment/Interventions ADLs/Self Care Home  Management;Aquatic Therapy;Cryotherapy;Electrical Stimulation;Moist Heat;Traction;Gait training;Functional mobility training;Therapeutic activities;Patient/family education;Therapeutic exercise;Balance training;Neuromuscular re-education;Manual techniques;Passive range of motion;Dry needling;Spinal Manipulations;Joint Manipulations    PT Next Visit Plan revisit isolated shoulder strengthening as tolerated (potentitally isometrics as to avoid undue plexus stretch in a state of healing)    PT Home Exercise Plan Access Code: [email or web address]    Consulted and Agree with Plan of Care Patient             Patient will benefit from skilled therapeutic intervention in order to improve the following deficits and impairments:  Pain, Decreased mobility, Increased muscle spasms, Postural dysfunction, Decreased activity tolerance, Decreased range of motion, Decreased strength  Visit Diagnosis: Acute pain of right shoulder  Muscle weakness (generalized)     Problem List Patient Active Problem List   Diagnosis Date Noted   Vitamin D deficiency 06/08/2020   Abnormal uterine bleeding 12/02/2019   Screening for STD (sexually transmitted disease) 10/19/2018   Encounter for birth control 03/23/2018   GERD (gastroesophageal reflux disease) 03/23/2018   Preventative health care 03/23/2018   Vitamin B12 deficiency due to intestinal malabsorption 12/16/2017   GAD (generalized anxiety disorder) 11/25/2017   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 [address] [place], [place], [address] Phone: [phone]   Fax:  [phone]  Name: Crystal CritchleySherika M Diaz MRN: [id number] Date of Birth: 1990-07-29

## 2021-02-12 ENCOUNTER — Other Ambulatory Visit: Payer: Self-pay | Admitting: Orthopedic Surgery

## 2021-02-12 ENCOUNTER — Other Ambulatory Visit (HOSPITAL_COMMUNITY): Payer: Self-pay | Admitting: Orthopedic Surgery

## 2021-02-12 DIAGNOSIS — M25511 Pain in right shoulder: Secondary | ICD-10-CM

## 2021-02-13 ENCOUNTER — Ambulatory Visit: Payer: PRIVATE HEALTH INSURANCE | Attending: Surgical

## 2021-02-13 ENCOUNTER — Other Ambulatory Visit: Payer: Self-pay

## 2021-02-13 DIAGNOSIS — M6281 Muscle weakness (generalized): Secondary | ICD-10-CM | POA: Insufficient documentation

## 2021-02-13 DIAGNOSIS — M25511 Pain in right shoulder: Secondary | ICD-10-CM | POA: Diagnosis not present

## 2021-02-13 NOTE — Therapy (Signed)
[facility] [address] [place], [place], [address] Phone: [phone]   Fax:  [phone]  Physical Therapy Treatment Physical Therapy Progress Note    Dates of reporting period  01/15/21   to   02/13/21   Patient Details  Name: Crystal Diaz MRN: [id number] Date of Birth: 12/29/1990 No data recorded  Encounter Date: 02/13/2021   PT End of Session - 02/13/21 1040     Visit Number 10    Number of Visits 12    Date for PT Re-Evaluation 03/12/21    Authorization Type Worker's Comp: approved for 1 evaluation and 12 treatments    Authorization Time Period Cert 06/21/60-9/52/841/3/23-2/28/23    Authorization - Visit Number 9    PT Start Time 1025    PT Stop Time 1055    PT Time Calculation (min) 30 min    Activity Tolerance Patient tolerated treatment well;No increased pain             Past Medical History:  Diagnosis Date   Anemia    Anxiety    GERD (gastroesophageal reflux disease)    Headache    Lumbar radiculopathy    PCOS (polycystic ovarian syndrome)    Plantar fasciitis, bilateral     Past Surgical History:  Procedure Laterality Date   FOOT SURGERY Right    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   Procedure: LAPAROSCOPIC ROUX-EN-Y GASTRIC BYPASS WITH HIATAL HERNIA REPAIR AND UPPER ENDOSCOPY;  Surgeon: Luretha MurphyMartin, Matthew, MD;  Location[facility];  Service: General;  Laterality: N/A;   WISDOM TOOTH EXTRACTION      There were no vitals filed for this visit.   Subjective Assessment - 02/13/21 1032     Subjective Pt doing well in general. Pain aroudn 4/10. Nerve study has been scheduled for next week at [facility]. MRI still not scheduled. HEP going fine at home.    Pertinent History Pt is a 31 y.o. female referred to OPPT for R shoulder/arm/ and periscapular pain. PMH includes: Anemia, anxiety, GERD, lumbar radiculopathy, Polycystic ovarian syndrome, B plantar fasciitis. Pt reports onset of ~3 weeks ago with N/T down into RUE  and hand. 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 MOI reported as an upward pull on R shoulder in axillary region causing GHJ elevation. Does reports symptoms as burning, N/T, throbbing at times in shoulder/periscapular region. Demonstrates tightness with cervial mobility but no reproduction of symptoms down RLE. typically hovers at 4/10 NPS, 7/10 NPS is worst. Currently a 6/10 NPS. Reports pain along posterior, lateral RUE onto dorsal R hand along4th and 5th finger. Reports symptoms improved with heat, prednisone. Pain is worse with shoulder mobility with getting dressed, driving, with RUE in dependent position worsens symptoms. Pt's goal is to improve shoulder pain, N/T symptoms.                [facility] - 02/13/21 0001       Observation/Other Assessments   Focus on Therapeutic Outcomes (FOTO)  63   41     ROM / Strength   AROM / PROM / Strength AROM      AROM   AROM Assessment Site Shoulder    Right/Left Shoulder Right;Left    Right Shoulder Extension 148 Degrees    pain at end-range, pain returning from elevation   Right Shoulder ABduction 150 Degrees  pain upon return   Left Shoulder Flexion 174 Degrees    Left Shoulder ABduction 165 Degrees             Therex:  -Rt Cable Row 1x10 @ 5lb, cues for eccentric control -Rt GHJ ER, elbow in 70 degrees abdct on table, 15-80 degrees, 4lb FW x10 -Rt Cable Row 1x10 @ 5lb, cues for eccentric control -Rt GHJ ER, elbow in 70 degrees abdct on table, 15-80 degrees, 4lb FW x10       PT Education - 02/13/21 1040     Education Details need to update HEP prio to schedule change    Person(s) Educated Patient    Methods Explanation    Comprehension Verbalized understanding              PT Short Term Goals - 02/13/21 1045       PT SHORT TERM GOAL #1   Title Pt will be independent with HEP to improve pain,  AROM, and strength to return to PLOF.    Baseline 01/16/2020: initiated    Time 4    Period Weeks    Status Achieved    Target Date 02/12/21               PT Long Term Goals - 02/13/21 1045       PT LONG TERM GOAL #1   Title Pt will improve FOTO score to target to demo clinically significant improvement in functional mobility.    Baseline 01/16/20: 41 with target of 60; 02/13/21: 63    Time 8    Period Weeks    Status Achieved    Target Date 03/12/21      PT LONG TERM GOAL #2   Title Pt will improve shoulder AROM to at least >160 degrees to demo ability to perform overhead ADL's.    Baseline 1/3: 91 deg elevation, 86 abduction; ~160 bilat    Time 8    Period Weeks    Status On-going    Target Date 03/12/21      PT LONG TERM GOAL #3   Title Pt will improve pain to < 5/10 NPS to shoulder motion to demo improved pain with functional mobility for ADL completion.    Baseline 01/15/21: Up to 7/10 NPS with shoulder motion; 2/1: mostly 4/10 with brief spikes    Time 8    Period Weeks    Status On-going    Target Date 03/12/21                   Plan - 02/13/21 1235     Clinical Impression Statement 10th visit summary this date- pt showing remarkable signs of progress. A/ROM of right shoulder significantly improved, but remains very painful at end range and when eccentrically lowering- still off from CL side by ~20%. Strength remains limited, as does general activity tolerance, but pt is having more consistent lower levels of pain overall, ~4/10 with brief increases. Pt will conitnue to benfit from skilled PT intervention to maximize return to PLOF in strength, ROM, and painfree tolerance to ADL/IADL.    Personal Factors and Comorbidities Age;Time since onset of injury/illness/exacerbation;Comorbidity 3+;Education;Fitness;Profession    Comorbidities Anemia, anxiety, GERD, lumbar radiculopathy, Polycystic ovarian syndrome, B plantar fasciitis.    Examination-Activity Limitations  Reach Overhead;Carry;Dressing;Sleep;Sit;Transfers;Toileting;Stand    Examination-Participation Restrictions Community Activity;Driving;Occupation;Yard Work    Stability/Clinical Decision Making Evolving/Moderate complexity    Clinical Decision Making Low    Rehab Potential Good    PT Frequency  2x / week    PT Duration 8 weeks    PT Treatment/Interventions ADLs/Self Care Home Management;Aquatic Therapy;Cryotherapy;Electrical Stimulation;Moist Heat;Traction;Gait training;Functional mobility training;Therapeutic activities;Patient/family education;Therapeutic exercise;Balance training;Neuromuscular re-education;Manual techniques;Passive range of motion;Dry needling;Spinal Manipulations;Joint Manipulations    PT Home Exercise Plan Access Code: [email or web address] (update next session to incorporate more focal shoulder extension and external rotation isometric or isotonic in a tolerated range)    Consulted and Agree with Plan of Care Patient             Patient will benefit from skilled therapeutic intervention in order to improve the following deficits and impairments:  Pain, Decreased mobility, Increased muscle spasms, Postural dysfunction, Decreased activity tolerance, Decreased range of motion, Decreased strength  Visit Diagnosis: Acute pain of right shoulder  Muscle weakness (generalized)     Problem List Patient Active Problem List   Diagnosis Date Noted   Vitamin D deficiency 06/08/2020   Abnormal uterine bleeding 12/02/2019   Screening for STD (sexually transmitted disease) 10/19/2018   Encounter for birth control 03/23/2018   GERD (gastroesophageal reflux disease) 03/23/2018   Preventative health care 03/23/2018   Vitamin B12 deficiency due to intestinal malabsorption 12/16/2017   GAD (generalized anxiety disorder) 11/25/2017   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 [place], [address] Phone: [phone]   Fax:  [phone]  Name: Crystal Diaz MRN: [id number] Date of Birth: Sep 07, 1990

## 2021-02-14 ENCOUNTER — Ambulatory Visit (INDEPENDENT_AMBULATORY_CARE_PROVIDER_SITE_OTHER): Payer: No Typology Code available for payment source | Admitting: Psychology

## 2021-02-14 DIAGNOSIS — F4323 Adjustment disorder with mixed anxiety and depressed mood: Secondary | ICD-10-CM

## 2021-02-14 NOTE — Progress Notes (Signed)
[facility] Counselor/Therapist Progress Note  Patient ID: Crystal Diaz, MRN: [id number],    Date: 02/14/2021  Time Spent: 50 mins  Treatment Type: Individual Therapy  Reported Symptoms: Pt presented for a follow up session, via webex video, due to the virus outbreak.  Pt granted consent for the session, stating that she is in her car with no one else present.  I shared with pt that I am in my office at home with no one else here either.  Previous pt of Crystal Diaz; saw her for about 3 yrs.    Mental Status Exam: Appearance:  Casual     Behavior: Appropriate  Motor: Normal  Speech/Language:  Clear and Coherent  Affect: Appropriate  Mood: normal  Thought process: normal  Thought content:   WNL  Sensory/Perceptual disturbances:   WNL  Orientation: oriented to person, place, time/date, and situation  Attention: Good  Concentration: Good  Memory: WNL  Fund of knowledge:  Good  Insight:   Good  Judgment:  Good  Impulse Control: Good   Risk Assessment: Danger to Self:  No Self-injurious Behavior: No Danger to Others: No Duty to Warn:no Physical Aggression / Violence:No  Access to Firearms a concern: No  Gang Involvement:No   Subjective: Pt shares that 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  She is getting a small raise and is appreciative of that as well.  She likes the fact that the staff there seems to want her to join their practice.  She is still struggling with the fact that her current job has been eliminated.  Pt shares that her shoulder is continuing to improve although she is still having some burning sensation in it.  She is scheduled for an EMG and an MRI next week.  Crystal Diaz has a court date coming up on 2/8 and she hopes to learn more about the case against him.  Pt shares she had a birthday this week and bought herself crab legs for the occasion.  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  Encouraged pt to continue with her self care activities and we will meet in 3 wks for a follow up session, after Crystal Diaz's court date and after she starts her new job.  Interventions: Cognitive Behavioral Therapy  Diagnosis:Adjustment disorder with mixed anxiety and depressed mood  Plan: Treatment Plan Strengths/Abilities:  Intelligent, Intuitive, Willing to participate in therapy Treatment Preferences:  Outpatient Individual Therapy Statement of Needs:  Patient is to use CBT, mindfulness and coping skills to help manage and/or decrease symptoms associated with their diagnosis. Symptoms:  Depressed/Irritable mood, worry, social withdrawal Problems Addressed:  Depressive thoughts, Sadness, Sleep issues, etc. Long Term Goals:  Pt to reduce overall level, frequency, and intensity of the feelings of depression/anxiety as evidenced by decreased irritability, negative self talk, and helpless feelings from 6 to 7 days/week to 0 to 1 days/week, per client report, for at least 3 consecutive months.  Progress: 20% Short Term Goals:  Pt to verbally express understanding of the relationship between feelings of depression/anxiety and their impact on thinking patterns and behaviors.  Pt to verbalize an understanding of the role that distorted thinking plays in creating fears, excessive worry, and ruminations.  Progress: 20% Target Date:  12/28/2021 Frequency:  Bi-weekly Modality:  Cognitive Behavioral Therapy Interventions by Therapist:  Therapist  will use CBT, Mindfulness exercises, Coping skills and Referrals, as needed by client. Client has verbally approved this treatment plan.  Karie Kirks, [facility]

## 2021-02-18 ENCOUNTER — Ambulatory Visit
Admission: RE | Admit: 2021-02-18 | Discharge: 2021-02-18 | Disposition: A | Discharge status: Home / Self Care | Payer: PRIVATE HEALTH INSURANCE | Source: Ambulatory Visit | Attending: Orthopedic Surgery | Admitting: Orthopedic Surgery

## 2021-02-18 ENCOUNTER — Ambulatory Visit: Payer: PRIVATE HEALTH INSURANCE

## 2021-02-18 ENCOUNTER — Other Ambulatory Visit: Payer: Self-pay

## 2021-02-18 DIAGNOSIS — M6281 Muscle weakness (generalized): Secondary | ICD-10-CM

## 2021-02-18 DIAGNOSIS — M25511 Pain in right shoulder: Secondary | ICD-10-CM | POA: Diagnosis present

## 2021-02-18 NOTE — Therapy (Signed)
[place] [facility] [address] [place], [address] Phone: [phone]   Fax:  [phone]  Physical Therapy Treatment  Patient Details  Name: Crystal Diaz MRN: [id number] Date of Birth: 1990/09/29 No data recorded  Encounter Date: 02/18/2021   PT End of Session - 02/18/21 1122     Visit Number 11    Number of Visits 12    Date for PT Re-Evaluation 03/12/21    Authorization Type Worker's Comp: approved for 1 evaluation and 12 treatments    Authorization Time Period Cert 05/20/81-2/54/98    Authorization - Visit Number 10    Authorization - Number of Visits 12    Progress Note Due on Visit 10    PT Start Time 1111    PT Stop Time 1140    PT Time Calculation (min) 29 min    Activity Tolerance Patient tolerated treatment well;No increased pain    Behavior During Therapy WFL for tasks assessed/performed             Past Medical History:  Diagnosis Date   Anemia    Anxiety    GERD (gastroesophageal reflux disease)    Headache    Lumbar radiculopathy    PCOS (polycystic ovarian syndrome)    Plantar fasciitis, bilateral     Past Surgical History:  Procedure Laterality Date   FOOT SURGERY Right    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   Procedure: LAPAROSCOPIC ROUX-EN-Y GASTRIC BYPASS WITH HIATAL HERNIA REPAIR AND UPPER ENDOSCOPY;  Surgeon: Luretha Murphy, MD;  Location: [facility];  Service: General;  Laterality: N/A;   WISDOM TOOTH EXTRACTION      There were no vitals filed for this visit.   Subjective Assessment - 02/18/21 1114     Subjective Pt reports doing well today. She got he rnew hot pack and had heat on shoulder over the weekend quite a bit, reports it to facilitate her ADL movement. Typical soreness after last session.    Pertinent History Pt is a 31 y.o. female referred to OPPT for R shoulder/arm/ and periscapular pain. PMH includes: Anemia, anxiety, GERD, lumbar radiculopathy, Polycystic ovarian  syndrome, B plantar fasciitis. Pt reports onset of ~3 weeks ago with N/T down into RUE and hand. 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 MOI reported as an upward pull on R shoulder in axillary region causing GHJ elevation. Does reports symptoms as burning, N/T, throbbing at times in shoulder/periscapular region. Demonstrates tightness with cervial mobility but no reproduction of symptoms down RLE. typically hovers at 4/10 NPS, 7/10 NPS is worst. Currently a 6/10 NPS. Reports pain along posterior, lateral RUE onto dorsal R hand along4th and 5th finger. Reports symptoms improved with heat, prednisone. Pain is worse with shoulder mobility with getting dressed, driving, with RUE in dependent position worsens symptoms. Pt's goal is to improve shoulder pain, N/T symptoms.             INTERVENTION THIS DATE:   -Arms crossed cervical ROM: rotation 15x bilat, extension x12  -hooklying on double towel roll x   Therex: -Rt Cable Row 1x12 @ 5lb, cues for eccentric control -Standing RUE shoulder flexion to 90 degrees  -Rt GHJ ER, elbow in 70 degrees abdct supported on table, 15-~80 degrees, 4lb FW x10 -Right shoulder ABDCT 1x15  -Rt Cable Row 1x12 @ 5lb, cues for eccentric control -Standing  RUE shoulder flexion to 90 degrees  -Rt GHJ ER, elbow in 70 degrees abdct supported on table, 15-~80 degrees, 4lb FW x10 -Right shoulder ABDCT 1x15          PT Education - 02/18/21 1121     Education Details Cues for normalized head movements.    Person(s) Educated Patient    Methods Explanation    Comprehension Verbalized understanding              PT Short Term Goals - 02/13/21 1045       PT SHORT TERM GOAL #1   Title Pt will be independent with HEP to improve pain, AROM, and strength to return to PLOF.    Baseline 01/16/2020: initiated    Time 4    Period Weeks    Status  Achieved    Target Date 02/12/21               PT Long Term Goals - 02/13/21 1045       PT LONG TERM GOAL #1   Title Pt will improve FOTO score to target to demo clinically significant improvement in functional mobility.    Baseline 01/16/20: 41 with target of 60; 02/13/21: 63    Time 8    Period Weeks    Status Achieved    Target Date 03/12/21      PT LONG TERM GOAL #2   Title Pt will improve shoulder AROM to at least >160 degrees to demo ability to perform overhead ADL's.    Baseline 1/3: 91 deg elevation, 86 abduction; ~160 bilat    Time 8    Period Weeks    Status On-going    Target Date 03/12/21      PT LONG TERM GOAL #3   Title Pt will improve pain to < 5/10 NPS to shoulder motion to demo improved pain with functional mobility for ADL completion.    Baseline 01/15/21: Up to 7/10 NPS with shoulder motion; 2/1: mostly 4/10 with brief spikes    Time 8    Period Weeks    Status On-going    Target Date 03/12/21                   Plan - 02/18/21 1124     Clinical Impression Statement Continued with targeted loading of Rt shoulder extensors and external rotators. Added in flexion and abduction against gravity this session. Symptoms stable throughout with intermittent burning deep in shoulder. Pt scheduled for MRI tonight. Pt has 2 remaining visits approved for workers comp. Pt reports progress is coming along.    Personal Factors and Comorbidities Age;Time since onset of injury/illness/exacerbation;Comorbidity 3+;Education;Fitness;Profession    Comorbidities Anemia, anxiety, GERD, lumbar radiculopathy, Polycystic ovarian syndrome, B plantar fasciitis.    Examination-Activity Limitations Reach Overhead;Carry;Dressing;Sleep;Sit;Transfers;Toileting;Stand    Examination-Participation Restrictions Community Activity;Driving;Occupation;Yard Work    Public house manager Low    Rehab Potential Good    PT  Frequency 2x / week    PT Duration 8 weeks    PT Treatment/Interventions ADLs/Self Care Home Management;Aquatic Therapy;Cryotherapy;Electrical Stimulation;Moist Heat;Traction;Gait training;Functional mobility training;Therapeutic activities;Patient/family education;Therapeutic exercise;Balance training;Neuromuscular re-education;Manual techniques;Passive range of motion;Dry needling;Spinal Manipulations;Joint Manipulations    PT Next Visit Plan revisit isolated shoulder strengthening as tolerated (potentitally isometrics as to avoid undue plexus stretch in a state of healing)    PT Home Exercise Plan Access Code: [email or web address] (update next session to incorporate more focal shoulder extension and external rotation isometric  or isotonic in a tolerated range)    Consulted and Agree with Plan of Care Patient             Patient will benefit from skilled therapeutic intervention in order to improve the following deficits and impairments:  Pain, Decreased mobility, Increased muscle spasms, Postural dysfunction, Decreased activity tolerance, Decreased range of motion, Decreased strength  Visit Diagnosis: Acute pain of right shoulder  Muscle weakness (generalized)     Problem List Patient Active Problem List   Diagnosis Date Noted   Vitamin D deficiency 06/08/2020   Abnormal uterine bleeding 12/02/2019   Screening for STD (sexually transmitted disease) 10/19/2018   Encounter for birth control 03/23/2018   GERD (gastroesophageal reflux disease) 03/23/2018   Preventative health care 03/23/2018   Vitamin B12 deficiency due to intestinal malabsorption 12/16/2017   GAD (generalized anxiety disorder) 11/25/2017   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  [place], [address] Phone: [phone]   Fax:  [phone]  Name: ESPERANSA SARABIA MRN: [id number] Date of Birth: [birth year]

## 2021-02-20 ENCOUNTER — Other Ambulatory Visit: Payer: Self-pay

## 2021-02-20 ENCOUNTER — Ambulatory Visit: Payer: PRIVATE HEALTH INSURANCE | Attending: Surgical

## 2021-02-20 DIAGNOSIS — M25511 Pain in right shoulder: Secondary | ICD-10-CM | POA: Diagnosis present

## 2021-02-20 DIAGNOSIS — M6281 Muscle weakness (generalized): Secondary | ICD-10-CM | POA: Diagnosis present

## 2021-02-20 NOTE — Therapy (Signed)
[place] [facility] [address], [place], [address] Phone: [phone]   Fax:  [phone]  Physical Therapy Treatment  Patient Details  Name: Crystal Diaz MRN: [id number] Date of Birth: 14-Jun-1990 No data recorded  Encounter Date: 02/20/2021   PT End of Session - 02/20/21 1130     Visit Number 12    Number of Visits 13    Date for PT Re-Evaluation 03/12/21    Authorization Type Worker's Comp: approved for 1 evaluation and 12 treatments    Authorization Time Period Cert [id number]    Authorization - Visit Number 11    Authorization - Number of Visits 12    Progress Note Due on Visit 20    PT Start Time 1110    PT Stop Time 1140    PT Time Calculation (min) 30 min    Activity Tolerance Patient tolerated treatment well;No increased pain    Behavior During Therapy WFL for tasks assessed/performed             Past Medical History:  Diagnosis Date   Anemia    Anxiety    GERD (gastroesophageal reflux disease)    Headache    Lumbar radiculopathy    PCOS (polycystic ovarian syndrome)    Plantar fasciitis, bilateral     Past Surgical History:  Procedure Laterality Date   FOOT SURGERY Right    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   Procedure: LAPAROSCOPIC ROUX-EN-Y GASTRIC BYPASS WITH HIATAL HERNIA REPAIR AND UPPER ENDOSCOPY;  Surgeon: Johnathan Hausen, MD;  Location: [facility];  Service: General;  Laterality: N/A;   WISDOM TOOTH EXTRACTION      There were no vitals filed for this visit.   Subjective Assessment - 02/20/21 1115     Subjective Pt doing well today, a little more sore but feels like session's strength session advances.    Pertinent History Pt is a 31yo. female referred to OPPT for R shoulder/arm/ and periscapular pain. PMH includes: Anemia, anxiety, GERD, lumbar radiculopathy, Polycystic ovarian syndrome, B plantar fasciitis. Pt reports onset of ~3 weeks ago with N/T down into RUE and hand. 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 MOI reported as an upward pull on R shoulder in axillary region causing GHJ elevation. Does reports symptoms as burning, N/T, throbbing at times in shoulder/periscapular region. Demonstrates tightness with cervial mobility but no reproduction of symptoms down RLE. typically hovers at 4/10 NPS, 7/10 NPS is worst. Currently a 6/10 NPS. Reports pain along posterior, lateral RUE onto dorsal R hand along4th and 5th finger. Reports symptoms improved with heat, prednisone. Pain is worse with shoulder mobility with getting dressed, driving, with RUE in dependent position worsens symptoms. Pt's goal is to improve shoulder pain, N/T symptoms.    Currently in Pain? Yes    Pain Score 6     Pain Location --   Rt shoulder throb              Intervention:  Rt shoulder flexion 1x15 (~80 degrees)  Rt shoulder ABDCT 1x15 (~80 degrees)  Bilat shoulder ER c Green TB 1x15x3secH  Rt shoulder bent over row 1x15@5lb    Rt shoulder flexion 1x15 (~80 degrees)  Rt shoulder ABDCT 1x15 (~80 degrees)  Bilat shoulder ER c Green TB 1x15x3secH  Rt shoulder bent over row 1x15@5lb    Rt shoulder flexion 1x15 (~80 degrees)  Rt shoulder ABDCT 1x15 (~80 degrees)  Bilat shoulder ER c Green TB 1x15x3secH  Rt shoulder bent over row 1x15@5lb             PT Education - 02/20/21 1128     Education Details reviewed updated items for HEP    Person(s) Educated Patient    Methods Explanation    Comprehension Verbalized understanding              PT Short Term Goals - 02/13/21 1045       PT SHORT TERM GOAL #1   Title Pt will be independent with HEP to improve pain, AROM, and strength to return to PLOF.    Baseline 01/16/2020: initiated    Time 4    Period Weeks    Status Achieved    Target Date 02/12/21               PT Long Term Goals - 02/13/21 1045        PT LONG TERM GOAL #1   Title Pt will improve FOTO score to target to demo clinically significant improvement in functional mobility.    Baseline 01/16/20: 41 with target of 60; 02/13/21: 63    Time 8    Period Weeks    Status Achieved    Target Date 03/12/21      PT LONG TERM GOAL #2   Title Pt will improve shoulder AROM to at least >160 degrees to demo ability to perform overhead ADL's.    Baseline 1/3: 91 deg elevation, 86 abduction; ~160 bilat    Time 8    Period Weeks    Status On-going    Target Date 03/12/21      PT LONG TERM GOAL #3   Title Pt will improve pain to < 5/10 NPS to shoulder motion to demo improved pain with functional mobility for ADL completion.    Baseline 01/15/21: Up to 7/10 NPS with shoulder motion; 2/1: mostly 4/10 with brief spikes    Time 8    Period Weeks    Status On-going    Target Date 03/12/21                   Plan - 02/20/21 1132     Clinical Impression Statement Progressed moderate resistance training in shoulder. Extension and row activities continue to be the most provoking of pain in shoulder. Symptoms stable throughout session. PT given handout for HEP update, encouraged to obtain dry beans for weight.    Personal Factors and Comorbidities Age;Time since onset of injury/illness/exacerbation;Comorbidity 3+;Education;Fitness;Profession    Comorbidities Anemia, anxiety, GERD, lumbar radiculopathy, Polycystic ovarian syndrome, B plantar fasciitis.    Examination-Activity Limitations Reach Overhead;Carry;Dressing;Sleep;Sit;Transfers;Toileting;Stand    Examination-Participation Restrictions Community Activity;Driving;Occupation;Yard Work    Biomedical scientist Low    Rehab Potential Good    PT Frequency 2x / week    PT Duration 8 weeks    PT Treatment/Interventions ADLs/Self Care Home Management;Aquatic Therapy;Cryotherapy;Electrical Stimulation;Moist  Heat;Traction;Gait training;Functional mobility training;Therapeutic activities;Patient/family education;Therapeutic exercise;Balance training;Neuromuscular re-education;Manual techniques;Passive range of motion;Dry needling;Spinal Manipulations;Joint Manipulations    PT Next Visit Plan Added in GreenTB ER (bilat) abduction c 1lb weight, 2lb flexion    Consulted and Agree with Plan of Care Patient             Patient will benefit from skilled therapeutic intervention in order to improve the following deficits and impairments:  Pain, Decreased mobility, Increased muscle spasms, Postural  dysfunction, Decreased activity tolerance, Decreased range of motion, Decreased strength  Visit Diagnosis: Acute pain of right shoulder  Muscle weakness (generalized)     Problem List Patient Active Problem List   Diagnosis Date Noted   Vitamin D deficiency 06/08/2020   Abnormal uterine bleeding 12/02/2019   Screening for STD (sexually transmitted disease) 10/19/2018   Encounter for birth control 03/23/2018   GERD (gastroesophageal reflux disease) 03/23/2018   Preventative health care 03/23/2018   Vitamin B12 deficiency due to intestinal malabsorption 12/16/2017   GAD (generalized anxiety disorder) 11/25/2017   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 [place], [address] Phone: [phone]   Fax:  [phone]  Name: Crystal Diaz MRN: [id number] Date of Birth: 10/08/1990

## 2021-02-25 ENCOUNTER — Ambulatory Visit: Payer: No Typology Code available for payment source | Admitting: Physical Therapy

## 2021-03-04 ENCOUNTER — Ambulatory Visit: Payer: PRIVATE HEALTH INSURANCE | Attending: Surgical | Admitting: Physical Therapy

## 2021-03-08 ENCOUNTER — Ambulatory Visit: Payer: No Typology Code available for payment source | Admitting: Psychology

## 2021-03-12 ENCOUNTER — Other Ambulatory Visit (HOSPITAL_BASED_OUTPATIENT_CLINIC_OR_DEPARTMENT_OTHER): Payer: Self-pay

## 2021-03-18 ENCOUNTER — Telehealth: Payer: Self-pay | Admitting: Physical Therapy

## 2021-03-18 ENCOUNTER — Ambulatory Visit: Payer: PRIVATE HEALTH INSURANCE | Attending: Surgical | Admitting: Physical Therapy

## 2021-03-18 DIAGNOSIS — M25511 Pain in right shoulder: Secondary | ICD-10-CM | POA: Insufficient documentation

## 2021-03-18 DIAGNOSIS — M6281 Muscle weakness (generalized): Secondary | ICD-10-CM | POA: Insufficient documentation

## 2021-03-18 NOTE — Telephone Encounter (Signed)
Pt reports that she was unable to make apt on time and that she plans on being at next session.  ?

## 2021-03-20 ENCOUNTER — Other Ambulatory Visit: Payer: Self-pay

## 2021-03-20 ENCOUNTER — Ambulatory Visit: Payer: PRIVATE HEALTH INSURANCE | Attending: Surgical | Admitting: Physical Therapy

## 2021-03-20 DIAGNOSIS — M6281 Muscle weakness (generalized): Secondary | ICD-10-CM | POA: Insufficient documentation

## 2021-03-20 DIAGNOSIS — M25511 Pain in right shoulder: Secondary | ICD-10-CM | POA: Diagnosis not present

## 2021-03-20 NOTE — Therapy (Signed)
[place] [facility] ?[address] ?[place], [place], [id number] ?Phone: [phone]   Fax:  [phone] ? ?Physical Therapy Treatment ? ?Patient Details  ?Name: Crystal CritchleySherika M Diaz ?MRN: [id number] ?Date of Birth: 09-24-1990 ?No data recorded ? ?Encounter Date: 03/20/2021 ? ? PT End of Session - 03/20/21 1745   ? ? Visit Number 13   ? Number of Visits 24   ? Date for PT Re-Evaluation 04/27/21   ? Authorization Type Worker's Comp: approved for 1 evaluation and 24 treatments   ? Authorization Time Period Cert 06/21/60-9/52/841/3/23-4/15/23   ? Authorization - Visit Number 13   ? Authorization - Number of Visits 24   ? Progress Note Due on Visit 20   ? PT Start Time 1730   ? PT Stop Time 1815   ? PT Time Calculation (min) 45 min   ? Activity Tolerance Patient tolerated treatment well;No increased pain   ? Behavior During Therapy [facility] for tasks assessed/performed   ? ?  ?  ? ?  ? ? ?Past Medical History:  ?Diagnosis Date  ? Anemia   ? Anxiety   ? GERD (gastroesophageal reflux disease)   ? Headache   ? Lumbar radiculopathy   ? PCOS (polycystic ovarian syndrome)   ? Plantar fasciitis, bilateral   ? ? ?Past Surgical History:  ?Procedure Laterality Date  ? FOOT SURGERY Right   ? 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 Procedure: LAPAROSCOPIC ROUX-EN-Y GASTRIC BYPASS WITH HIATAL HERNIA REPAIR AND UPPER ENDOSCOPY;  Surgeon: Luretha MurphyMartin, Matthew, MD;  Location: [facility];  Service: General;  Laterality: N/A;  ? WISDOM TOOTH EXTRACTION    ? ? ?There were no vitals filed for this visit. ? ? Subjective Assessment - 03/20/21 1742   ? ? Subjective Pt reports that she continues to feel burning sensation in her right shoulder. It has improved somewhat but she is still concerned that she is not progressing enough.   ? Pertinent History Pt is a 31yo. female referred to OPPT for R shoulder/arm/ and periscapular pain. PMH includes: Anemia, anxiety, GERD, lumbar radiculopathy, Polycystic ovarian syndrome, B plantar  fasciitis. Pt reports onset of ~3 weeks ago with N/T down into RUE and hand. 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 MOI reported as an upward pull on R shoulder in axillary region causing GHJ elevation. Does reports symptoms as burning, N/T, throbbing at times in shoulder/periscapular region. Demonstrates tightness with cervial mobility but no reproduction of symptoms down RLE. typically hovers at 4/10 NPS, 7/10 NPS is worst. Currently a 6/10 NPS. Reports pain along posterior, lateral RUE onto dorsal R hand along4th and 5th finger. Reports symptoms improved with heat, prednisone. Pain is worse with shoulder mobility with getting dressed, driving, with RUE in dependent position worsens symptoms. Pt's goal is to improve shoulder pain, N/T symptoms.   ? Currently in Pain? Yes   ? Pain Score 7    ? Pain Location Shoulder   ? Pain Orientation Right   ? Pain Descriptors / Indicators Aching;Burning   ? Pain Type Acute pain;Neuropathic pain   ? ?  ?  ? ?  ? ? ?THEREX: ? ?UBE Seat Level 13; 5 min forward + 5 min backward  ? ?Discussion about bed setup to avoid rolling onto injured side during sleep  ? ?Eccentric forward flexion with Green TB 2 x 10  ?-min  VC to decrease speed of exercise   ? ? ? ? ? ? ? ? ? ? ? ? ? ? ? ? ? ? ? ? ? ? ? ? ? ? PT Education - 03/20/21 1745   ? ? Education Details form and technique for appropriate exercise   ? Person(s) Educated Patient   ? Methods Explanation;Demonstration;Verbal cues;Handout   ? Comprehension Verbalized understanding;Returned demonstration;Verbal cues required   ? ?  ?  ? ?  ? ? ? PT Short Term Goals - 03/20/21 1750   ? ?  ? PT SHORT TERM GOAL #1  ? Title Pt will be independent with HEP to improve pain, AROM, and strength to return to PLOF.   ? Baseline 01/16/2020: initiated   ? Time 4   ? Period Weeks   ? Status Achieved   ? Target Date 02/12/21   ? ?  ?  ? ?  ? ? ? ?  PT Long Term Goals - 03/20/21 1750   ? ?  ? PT LONG TERM GOAL #1  ? Title Pt will improve FOTO score to target to demo clinically significant improvement in functional mobility.   ? Baseline 01/16/20: 41 with target of 60; 02/13/21: 63   ? Time 8   ? Period Weeks   ? Status Achieved   ? Target Date 03/12/21   ?  ? PT LONG TERM GOAL #2  ? Title Pt will improve shoulder AROM to at least >160 degrees to demo ability to perform overhead ADL's.   ? Baseline 1/3: 91 deg elevation, 86 abduction; ~160 bilat   ? Time 8   ? Period Weeks   ? Status On-going   ? Target Date 03/12/21   ?  ? PT LONG TERM GOAL #3  ? Title Pt will improve pain to < 5/10 NPS to shoulder motion to demo improved pain with functional mobility for ADL completion.   ? Baseline 01/15/21: Up to 7/10 NPS with shoulder motion; 2/1: mostly 4/10 with brief spikes   ? Time 8   ? Period Weeks   ? Status On-going   ? Target Date 03/12/21   ? ?  ?  ? ?  ? ? ? ? ? ? ? ? Plan - 03/21/21 1245   ? ? Clinical Impression Statement Pt continues to experience pain with right shoulder flexion and abduction above 90 degrees 2/2 partial tear of supraspinatus. Reviewed HEP and modified shoulder flexion exercise to include focus on eccentric control. Pt is awaiting a second opinion from orthopedist given ongoing right shoulder pain. PT will explore recommendation of pain clinic with pt next visit for support in patient's pain management.   ? Personal Factors and Comorbidities Age;Time since onset of injury/illness/exacerbation;Comorbidity 3+;Education;Fitness;Profession   ? Comorbidities Anemia, anxiety, GERD, lumbar radiculopathy, Polycystic ovarian syndrome, B plantar fasciitis.   ? Examination-Activity Limitations Reach Overhead;Carry;Dressing;Sleep;Sit;Transfers;Toileting;Stand   ? Examination-Participation Restrictions Community Activity;Driving;Occupation;Pincus Badder Work   ? Stability/Clinical Decision Making Evolving/Moderate complexity   ? Clinical Decision Making Low   ? Rehab  Potential Good   ? PT Frequency 2x / week   ? PT Duration 8 weeks   ? PT Treatment/Interventions ADLs/Self Care Home Management;Aquatic Therapy;Cryotherapy;Electrical Stimulation;Moist Heat;Traction;Gait training;Functional mobility training;Therapeutic activities;Patient/family education;Therapeutic exercise;Balance training;Neuromuscular re-education;Manual techniques;Passive range of motion;Dry needling;Spinal Manipulations;Joint Manipulations   ? PT Next Visit Plan Continue to progress right shoulder ROM and strength. Activity modification   ? PT Home Exercise Plan Access Code: [email or web address] (update next session to incorporate more  focal shoulder extension and external rotation isometric or isotonic in a tolerated range)   ? Recommended Other Services [facility]   ? Consulted and Agree with Plan of Care Patient   ? ?  ?  ? ?  ? ? ?Patient will benefit from skilled therapeutic intervention in order to improve the following deficits and impairments:  Pain, Decreased mobility, Increased muscle spasms, Postural dysfunction, Decreased activity tolerance, Decreased range of motion, Decreased strength ? ?Visit Diagnosis: ?Acute pain of right shoulder ? ?Muscle weakness (generalized) ? ? ? ? ?Problem List ?Patient Active Problem List  ? Diagnosis Date Noted  ? Vitamin D deficiency 06/08/2020  ? Abnormal uterine bleeding 12/02/2019  ? Screening for STD (sexually transmitted disease) 10/19/2018  ? Encounter for birth control 03/23/2018  ? GERD (gastroesophageal reflux disease) 03/23/2018  ? Preventative health care 03/23/2018  ? Vitamin B12 deficiency due to intestinal malabsorption 12/16/2017  ? GAD (generalized anxiety disorder) 11/25/2017  ? 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 ?Ellin Goodie PT, DPT  ?03/21/2021, 12:53 PM ? ?[place] ?[facility] ?[address] ?[place], [place], [address] ?Phone: [phone]   Fax:  [phone] ? ?Name: CARLINDA OHLSON ?MRN:  [id number] ?Date of Birth: 07/20/1990 ? ? ? ?

## 2021-03-22 ENCOUNTER — Ambulatory Visit (INDEPENDENT_AMBULATORY_CARE_PROVIDER_SITE_OTHER): Payer: No Typology Code available for payment source | Admitting: Psychology

## 2021-03-22 DIAGNOSIS — F4323 Adjustment disorder with mixed anxiety and depressed mood: Secondary | ICD-10-CM

## 2021-03-22 NOTE — Progress Notes (Signed)
[facility] Counselor/Therapist Progress Note ? ?Patient ID: Crystal Diaz, MRN: [id number],   ? ?Date: 03/22/2021 ? ?Time Spent: 50 mins ? ?Treatment Type: Individual Therapy ? ?Reported Symptoms: Pt presented for a follow up session, via webex video, due to the virus outbreak.  Pt granted consent for the session, stating that she is in her car with no one else present.  I shared with pt that I am in my office at home with no one else here either.  Previous pt of Elisha Ponder; saw her for about 3 yrs.   ? ?Mental Status Exam: ?Appearance:  Casual     ?Behavior: Appropriate  ?Motor: Normal  ?Speech/Language:  Clear and Coherent  ?Affect: Appropriate  ?Mood: normal  ?Thought process: normal  ?Thought content:   WNL  ?Sensory/Perceptual disturbances:   WNL  ?Orientation: oriented to person, place, time/date, and situation  ?Attention: Good  ?Concentration: Good  ?Memory: WNL  ?Fund of knowledge:  Good  ?Insight:   Good  ?Judgment:  Good  ?Impulse Control: Good  ? ?Risk Assessment: ?Danger to Self:  No ?Self-injurious Behavior: No ?Danger to Others: No ?Duty to Warn:no ?Physical Aggression / Violence:No  ?Access to Firearms a concern: No  ?Gang Involvement:No  ? ?Subjective: Pt shares that her arm is still bother her and she has 12 more PT visits; she does not feel very supported by her doctor in this process.  She also had a disturbing conversation with the WC case mgr and she was not happy about that.  Pt complained to the case mgr's supervisor and she felt better about that interaction.  She had another visit with the doctor after a recent MRI.  She was so upset by that doctor because he was upset with her because she was crying and he refused to do the shot in her shoulder.  Pt is still having pain in her shoulder.  She has registered a concern with [facility] about the whole situation and pt shares that that person is investigating the situation.  She is hopeful that she will heal and heal correctly.  Pt  also still settling into her new position in the [employer] at [employer].  She feels that the job I going well; "I will have 4 physicians that I will be working with.  They are telling me what a good job I am doing and what an asset I will be to the practice.  Those affirmations feels good to me."  Pt shares that [name] is getting a new Arts administrator and pt hopes to get info from them soon.  Pt shares that self care activities include getting her nails done, had an appt for her lashes, ate crab legs to celebrate her mom's birthday, etc.  Encouraged pt to continue with her self care activities and we will meet in 2 wks for a follow up session. ? ?Interventions: Cognitive Behavioral Therapy ? ?Diagnosis:Adjustment disorder with mixed anxiety and depressed mood ? ?Plan: Treatment Plan ?Strengths/Abilities:  Intelligent, Intuitive, Willing to participate in therapy ?Treatment Preferences:  Outpatient Individual Therapy ?Statement of Needs:  Patient is to use CBT, mindfulness and coping skills to help manage and/or decrease symptoms associated with their diagnosis. ?Symptoms:  Depressed/Irritable mood, worry, social withdrawal ?Problems Addressed:  Depressive thoughts, Sadness, Sleep issues, etc. ?Long Term Goals:  Pt to reduce overall level, frequency, and intensity of the feelings of depression/anxiety as evidenced by decreased irritability, negative self talk, and helpless feelings from 6 to 7 days/week to  0 to 1 days/week, per client report, for at least 3 consecutive months.  Progress: 20% ?Short Term Goals:  Pt to verbally express understanding of the relationship between feelings of depression/anxiety and their impact on thinking patterns and behaviors.  Pt to verbalize an understanding of the role that distorted thinking plays in creating fears, excessive worry, and ruminations.  Progress: 20% ?Target Date:  12/28/2021 ?Frequency:  Bi-weekly ?Modality:  Cognitive Behavioral Therapy ?Interventions by Therapist:   Therapist will use CBT, Mindfulness exercises, Coping skills and Referrals, as needed by client. ?Client has verbally approved this treatment plan. ? ?Karie Kirks, [facility] ?

## 2021-03-25 ENCOUNTER — Ambulatory Visit: Payer: PRIVATE HEALTH INSURANCE | Admitting: Physical Therapy

## 2021-03-25 ENCOUNTER — Other Ambulatory Visit: Payer: Self-pay

## 2021-03-27 ENCOUNTER — Ambulatory Visit: Payer: PRIVATE HEALTH INSURANCE | Admitting: Physical Therapy

## 2021-03-27 ENCOUNTER — Other Ambulatory Visit: Payer: Self-pay

## 2021-03-27 DIAGNOSIS — M25511 Pain in right shoulder: Secondary | ICD-10-CM

## 2021-03-27 DIAGNOSIS — M6281 Muscle weakness (generalized): Secondary | ICD-10-CM | POA: Diagnosis present

## 2021-03-27 NOTE — Therapy (Signed)
[place] ?[facility] ?[address] ?[place], [place], [address] ?Phone: [phone]   Fax:  [phone] ? ?Physical Therapy Treatment ? ?Patient Details  ?Name: Crystal CritchleySherika M Diaz ?MRN: [id number] ?Date of Birth: [birth year] ?No data recorded ? ?Encounter Date: 03/27/2021 ? ? PT End of Session - 03/27/21 1809   ? ? Visit Number 14   ? Number of Visits 24   ? Date for PT Re-Evaluation 04/27/21   ? Authorization Type Worker's Comp: approved for 1 evaluation and 24 treatments   ? Authorization Time Period Cert 03/15/42-0/10/271/3/23-4/15/23   ? Authorization - Visit Number 14   ? Authorization - Number of Visits 24   ? Progress Note Due on Visit 20   ? PT Start Time 1805   ? PT Stop Time 1845   ? PT Time Calculation (min) 40 min   ? Activity Tolerance Patient tolerated treatment well;No increased pain   ? Behavior During Therapy [facility] for tasks assessed/performed   ? ?  ?  ? ?  ? ? ?Past Medical History:  ?Diagnosis Date  ? Anemia   ? Anxiety   ? GERD (gastroesophageal reflux disease)   ? Headache   ? Lumbar radiculopathy   ? PCOS (polycystic ovarian syndrome)   ? Plantar fasciitis, bilateral   ? ? ?Past Surgical History:  ?Procedure Laterality Date  ? FOOT SURGERY Right   ? 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 Procedure: LAPAROSCOPIC ROUX-EN-Y GASTRIC BYPASS WITH HIATAL HERNIA REPAIR AND UPPER ENDOSCOPY;  Surgeon: Luretha MurphyMartin, Matthew, MD;  Location: [facility];  Service: General;  Laterality: N/A;  ? WISDOM TOOTH EXTRACTION    ? ? ?There were no vitals filed for this visit. ? ? Subjective Assessment - 03/27/21 1807   ? ? Subjective Pt reports that she has increased RUE pain from clapping at her son's game. She is taking NSAIDs to treat pain.   ? Pertinent History Pt is a 31yo. female referred to OPPT for R shoulder/arm/ and periscapular pain. PMH includes: Anemia, anxiety, GERD, lumbar radiculopathy, Polycystic ovarian syndrome, B plantar fasciitis. Pt reports onset of ~3 weeks ago with N/T down  into RUE and hand. 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 MOI reported as an upward pull on R shoulder in axillary region causing GHJ elevation. Does reports symptoms as burning, N/T, throbbing at times in shoulder/periscapular region. Demonstrates tightness with cervial mobility but no reproduction of symptoms down RLE. typically hovers at 4/10 NPS, 7/10 NPS is worst. Currently a 6/10 NPS. Reports pain along posterior, lateral RUE onto dorsal R hand along4th and 5th finger. Reports symptoms improved with heat, prednisone. Pain is worse with shoulder mobility with getting dressed, driving, with RUE in dependent position worsens symptoms. Pt's goal is to improve shoulder pain, N/T symptoms.   ? Currently in Pain? Yes   ? Pain Score 6    ? Pain Location Shoulder   ? Pain Orientation Right   ? Pain Descriptors / Indicators Aching;Burning   ? Pain Type Acute pain   ? ?  ?  ? ?  ? ?THEREX:  ? ?Shoulder Flexion/Extension AAROM 3 x 10  ?Shoulder Abduction/Adduction AAROM 3 x 10  ? ?Shoulder Flexion Eccentric Raises with YTB 1 x 10  ?-Pt reports NPS of 6 out of 10 when lowering arm down  ? ?Shoulder Flexion Eccentric Raises 3 x 10  ?-  Frontal raise with assist from LUE and eccentric phase control with  ? ?Standing Shoulder T's 2 x 10  ? ? ? ? ? ? ? ? ? ? ? ? ? ? ? PT Education - 03/27/21 1808   ? ? Education Details form and technique for appropriate exercise   ? Person(s) Educated Patient   ? Methods Explanation;Demonstration;Verbal cues;Handout   ? Comprehension Verbalized understanding;Returned demonstration;Verbal cues required   ? ?  ?  ? ?  ? ? ? PT Short Term Goals - 03/27/21 1810   ? ?  ? PT SHORT TERM GOAL #1  ? Title Pt will be independent with HEP to improve pain, AROM, and strength to return to PLOF.   ? Baseline 01/16/2020: initiated   ? Time 4   ? Period Weeks   ? Status Achieved   ? Target Date 02/12/21    ? ?  ?  ? ?  ? ? ? ? PT Long Term Goals - 03/27/21 1811   ? ?  ? PT LONG TERM GOAL #1  ? Title Pt will improve FOTO score to target to demo clinically significant improvement in functional mobility.   ? Baseline 01/16/20: 41 with target of 60; 02/13/21: 63   ? Time 8   ? Period Weeks   ? Status Achieved   ? Target Date 03/12/21   ?  ? PT LONG TERM GOAL #2  ? Title Pt will improve shoulder AROM to at least >160 degrees to demo ability to perform overhead ADL's.   ? Baseline 1/3: 91 deg elevation, 86 abduction; ~160 bilat   ? Time 8   ? Period Weeks   ? Status On-going   ? Target Date 03/12/21   ?  ? PT LONG TERM GOAL #3  ? Title Pt will improve pain to < 5/10 NPS to shoulder motion to demo improved pain with functional mobility for ADL completion.   ? Baseline 01/15/21: Up to 7/10 NPS with shoulder motion; 2/1: mostly 4/10 with brief spikes 3/15: 6/10   ? Time 8   ? Period Weeks   ? Status On-going   ? Target Date 03/12/21   ? ?  ?  ? ?  ? ? ? ? ? ? ? ? Plan - 03/27/21 1809   ? ? Clinical Impression Statement Pt exhibits improved activity tolerance to overhead RUE activity with ability to perform shoulder abduction and flexion with eccentric motion with minor pain and discomfort. Session focused on graded activity to determine exercises that pt could reliable perform without excessive pain. She is awaiting second opinion about RTC injury and she will continue with PT to continue to regain ROM and strength and to decrease pain.   ? Personal Factors and Comorbidities Age;Time since onset of injury/illness/exacerbation;Comorbidity 3+;Education;Fitness;Profession   ? Comorbidities Anemia, anxiety, GERD, lumbar radiculopathy, Polycystic ovarian syndrome, B plantar fasciitis.   ? Examination-Activity Limitations Reach Overhead;Carry;Dressing;Sleep;Sit;Transfers;Toileting;Stand   ? Examination-Participation Restrictions Community Activity;Driving;Occupation;Pincus Badder Work   ? Stability/Clinical Decision Making Evolving/Moderate  complexity   ? Clinical Decision Making Low   ? Rehab Potential Good   ? PT Frequency 2x / week   ? PT Duration 8 weeks   ? PT Treatment/Interventions ADLs/Self Care Home Management;Aquatic Therapy;Cryotherapy;Electrical Stimulation;Moist Heat;Traction;Gait training;Functional mobility training;Therapeutic activities;Patient/family education;Therapeutic exercise;Balance training;Neuromuscular re-education;Manual techniques;Passive range of motion;Dry needling;Spinal Manipulations;Joint Manipulations   ? PT Next Visit Plan Progress shoulder exercises with increased resistance or with increased ROM.   ? PT Home Exercise Plan Access Code:  [email or web address] (update next session to incorporate more focal shoulder extension and external rotation isometric or isotonic in a tolerated range)   ? Recommended Other Services Pain Management Clinic   ? Consulted and Agree with Plan of Care Patient   ? ?  ?  ? ?  ? ?HEP includes the following: ? ? ?Access Code: [email or web address] ?URL: [email or web address] ?Date: 03/27/2021 ?Prepared by: Ellin Goodie ? ?Exercises ?Bicep Stretch at Table - 1 x daily - 7 x weekly - 1 sets - 3 reps - 60 hold ?Seated Shoulder Row with Anchored Resistance - 1 x daily - 3 x weekly - 3 sets - 10 reps ?Standing Eccentric Shoulder Flexion at Wall - 1 x daily - 3 x weekly - 2 sets - 10 reps ?Shoulder Abduction with Dumbbells - Thumbs Up - 1 x daily - 3 x weekly - 2 sets - 10 reps ?Standing Shoulder Horizontal Abduction with Resistance - 1 x daily - 3 x weekly - 2 sets - 10 reps ?Patient will benefit from skilled therapeutic intervention in order to improve the following deficits and impairments:  Pain, Decreased mobility, Increased muscle spasms, Postural dysfunction, Decreased activity tolerance, Decreased range of motion, Decreased strength ? ?Visit Diagnosis: ?Acute pain of right shoulder ? ?Muscle weakness (generalized) ? ? ? ? ?Problem List ?Patient Active Problem List  ? Diagnosis Date Noted  ? Vitamin  D deficiency 06/08/2020  ? Abnormal uterine bleeding 12/02/2019  ? Screening for STD (sexually transmitted disease) 10/19/2018  ? Encounter for birth control 03/23/2018  ? GERD (gastroesophageal reflux dis

## 2021-04-01 ENCOUNTER — Other Ambulatory Visit: Payer: Self-pay

## 2021-04-01 ENCOUNTER — Ambulatory Visit: Payer: PRIVATE HEALTH INSURANCE | Admitting: Physical Therapy

## 2021-04-03 ENCOUNTER — Other Ambulatory Visit: Payer: Self-pay

## 2021-04-03 ENCOUNTER — Encounter: Payer: Self-pay | Admitting: Physical Therapy

## 2021-04-03 ENCOUNTER — Ambulatory Visit: Payer: PRIVATE HEALTH INSURANCE | Admitting: Physical Therapy

## 2021-04-03 DIAGNOSIS — M25511 Pain in right shoulder: Secondary | ICD-10-CM | POA: Diagnosis not present

## 2021-04-03 DIAGNOSIS — M6281 Muscle weakness (generalized): Secondary | ICD-10-CM

## 2021-04-03 NOTE — Therapy (Signed)
?OUTPATIENT PHYSICAL THERAPY TREATMENT NOTE ? ? ?Patient Name: Crystal Diaz ?MRN: [id number] ?DOB:May 08, 1990, 31 y.o., female ?Today's Date: 04/04/2021 ? ?PCP: Doreene Nest, NP ?REFERRING PROVIDER: Fredia Sorrow, PA-C ? ? PT End of Session - 04/04/21 1607   ? ? Visit Number 15   ? Number of Visits 24   ? Date for PT Re-Evaluation 04/27/21   ? Authorization Type Worker's Comp: approved for 1 evaluation and 24 treatments   ? Authorization Time Period Cert 0/8/65-7/84/69   ? Authorization - Visit Number 15   ? Authorization - Number of Visits 24   ? Progress Note Due on Visit 20   ? PT Start Time 1800   ? PT Stop Time 1845   ? PT Time Calculation (min) 45 min   ? Activity Tolerance Patient tolerated treatment well;No increased pain   ? Behavior During Therapy [facility] for tasks assessed/performed   ? ?  ?  ? ?  ? ? ?Past Medical History:  ?Diagnosis Date  ? Anemia   ? Anxiety   ? GERD (gastroesophageal reflux disease)   ? Headache   ? Lumbar radiculopathy   ? PCOS (polycystic ovarian syndrome)   ? Plantar fasciitis, bilateral   ? ?Past Surgical History:  ?Procedure Laterality Date  ? FOOT SURGERY Right   ? 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 Procedure: LAPAROSCOPIC ROUX-EN-Y GASTRIC BYPASS WITH HIATAL HERNIA REPAIR AND UPPER ENDOSCOPY;  Surgeon: Luretha Murphy, MD;  Location: [facility];  Service: General;  Laterality: N/A;  ? WISDOM TOOTH EXTRACTION    ? ?Patient Active Problem List  ? Diagnosis Date Noted  ? Vitamin D deficiency 06/08/2020  ? Abnormal uterine bleeding 12/02/2019  ? Screening for STD (sexually transmitted disease) 10/19/2018  ? Encounter for birth control 03/23/2018  ? GERD (gastroesophageal reflux disease) 03/23/2018  ? Preventative health care 03/23/2018  ? Vitamin B12 deficiency due to intestinal malabsorption 12/16/2017  ? GAD (generalized anxiety disorder) 11/25/2017  ? 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 ? ?REFERRING DIAG: Right shoulder pain  ? ?THERAPY DIAG:  ?Acute pain of right  shoulder ? ?Muscle weakness (generalized) ? ?PERTINENT HISTORY: Pt is a 31yo. female referred to OPPT for R shoulder/arm/ and periscapular pain. PMH includes: Anemia, anxiety, GERD, lumbar radiculopathy, Polycystic ovarian syndrome, B plantar fasciitis. Pt reports onset of ~3 weeks ago with N/T down into RUE and hand. 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 MOI reported as an upward pull on R shoulder in axillary region causing GHJ elevation. Does reports symptoms as burning, N/T, throbbing at times in shoulder/periscapular region. Demonstrates tightness with cervial mobility but no reproduction of symptoms down RLE. typically hovers at 4/10 NPS, 7/10 NPS is worst. Currently a 6/10 NPS. Reports pain along posterior, lateral RUE onto dorsal R hand along4th and 5th finger. Reports symptoms improved with heat, prednisone. Pain is worse with shoulder mobility with getting dressed, driving, with RUE in dependent position worsens symptoms. Pt's goal is to improve shoulder pain, N/T symptoms.  ? ?PRECAUTIONS: None  ? ?SUBJECTIVE: Pt reports that she continues to have a burning pain in her right shoulder especially when performing overhead exercises.  ? ?PAIN:  ?Are you having pain? Yes: NPRS scale: 6/10 ?Pain location: right shoulder, anterior  ?Pain description: burning and achy ?Aggravating factors: Reaching overhead  ?Relieving factors: Keeping arm by her side  ? ? ? ? ?TODAY'S TREATMENT:  ?  04/03/21 ? ?MANUAL  ?Right Trap Trigger Point release  ? ?THEREX  ?UBE 7 min, forward only  ?Shoulder Flexion to 75 deg 3 x 10  ?Shoulder Abduction to 90 deg 3 x 10  ?Seated Rows with Red TB 3 x 10  ? ?Discussion about ice and heat with ComfyTemp shoulder bag to decrease pain and inflammation post workout.  ? ? ?PATIENT EDUCATION: ?Education details: form and technique for appropriate exercise  ?Person educated:  Patient ?Education method: Explanation, Demonstration, Verbal cues, and Handouts ?Education comprehension: verbalized understanding, returned demonstration, and verbal cues required ? ? ?HOME EXERCISE PROGRAM: ? ? ?Access Code: [email or web address] ?URL: [email or web address] ?Date: 04/03/2021 ?Prepared by: Ellin Goodie ? ?Exercises ?- Seated Shoulder Row with Anchored Resistance  - 1 x daily - 4 x weekly - 2 sets - 10 reps ?- Shoulder Flexion to 75 deg   - 1 x daily - 7 x weekly - 3 sets - 10 reps ?- Shoulder Abduction - Thumbs Up  - 1 x daily - 7 x weekly - 3 sets - 10 reps ? ? ? ? ? PT Short Term Goals    ? ?  ? PT SHORT TERM GOAL #1  ? Title Pt will be independent with HEP to improve pain, AROM, and strength to return to PLOF.   ? Baseline 01/16/2020: initiated   ? Time 4   ? Period Weeks   ? Status Achieved   ? Target Date 02/12/21   ? ?  ?  ? ?  ? ? ? PT Long Term Goals   ? ?  ? PT LONG TERM GOAL #1  ? Title Pt will improve FOTO score to target to demo clinically significant improvement in functional mobility.   ? Baseline 01/16/20: 41 with target of 60; 02/13/21: 63   ? Time 8   ? Period Weeks   ? Status Achieved   ? Target Date 03/12/21   ?  ? PT LONG TERM GOAL #2  ? Title Pt will improve shoulder AROM to at least >160 degrees to demo ability to perform overhead ADL's.   ? Baseline 1/3: 91 deg elevation, 86 abduction; ~160 bilat   ? Time 8   ? Period Weeks   ? Status On-going   ? Target Date 03/12/21   ?  ? PT LONG TERM GOAL #3  ? Title Pt will improve pain to < 5/10 NPS to shoulder motion to demo improved pain with functional mobility for ADL completion.   ? Baseline 01/15/21: Up to 7/10 NPS with shoulder motion; 2/1: mostly 4/10 with brief spikes 3/15: 6/10   ? Time 8   ? Period Weeks   ? Status On-going   ? Target Date 03/12/21   ? ?  ?  ? ?  ? ? ? Plan - 04/04/21 1610   ? ? Clinical Impression Statement Pt exhibits improved right shoulder strength with modification to shoulder exercise with graded motion to  only maintain flexion and abduction with a smaller pain free ROM. This ROM is sufficient for her to perform her functional duties performing typing and reaching steering wheel to drive car. Pt is still awaiting second opinion for shoulder and she will continue with skilled PT to improve right shoulder ROM and strength to return to performing overhead activties within pain free.   ? Personal Factors and Comorbidities Age;Time since onset of injury/illness/exacerbation;Comorbidity 3+;Education;Fitness;Profession   ? Comorbidities Anemia, anxiety, GERD, lumbar radiculopathy, Polycystic ovarian syndrome, B plantar fasciitis.   ?  Examination-Activity Limitations Reach Overhead;Carry;Dressing;Sleep;Sit;Transfers;Toileting;Stand   ? Examination-Participation Restrictions Community Activity;Driving;Occupation;Pincus BadderYard Work   ? Stability/Clinical Decision Making Evolving/Moderate complexity   ? Clinical Decision Making Low   ? Rehab Potential Good   ? PT Frequency 2x / week   ? PT Duration 8 weeks   ? PT Treatment/Interventions ADLs/Self Care Home Management;Aquatic Therapy;Cryotherapy;Electrical Stimulation;Moist Heat;Traction;Gait training;Functional mobility training;Therapeutic activities;Patient/family education;Therapeutic exercise;Balance training;Neuromuscular re-education;Manual techniques;Passive range of motion;Dry needling;Spinal Manipulations;Joint Manipulations   ? PT Next Visit Plan Progress shoulder ROM and strength with graded activity.   ? PT Home Exercise Plan Access Code: [email or web address] (update next session to incorporate more focal shoulder extension and external rotation isometric or isotonic in a tolerated range)   ? Recommended Other Services Pain Management Clinic   ? Consulted and Agree with Plan of Care Patient   ? ?  ?  ? ?  ? ? ? ?Ellin Goodieaniel Louiza Moor PT, DPT  ?Johnn Haianiel J Corda Shutt, PT ?04/04/2021, 4:17 PM ? ?  ? ?

## 2021-04-04 ENCOUNTER — Encounter: Payer: Self-pay | Admitting: Physical Therapy

## 2021-04-05 ENCOUNTER — Ambulatory Visit (INDEPENDENT_AMBULATORY_CARE_PROVIDER_SITE_OTHER): Payer: No Typology Code available for payment source | Admitting: Psychology

## 2021-04-05 DIAGNOSIS — F4323 Adjustment disorder with mixed anxiety and depressed mood: Secondary | ICD-10-CM

## 2021-04-05 NOTE — Progress Notes (Signed)
[facility] Counselor/Therapist Progress Note ? ?Patient ID: GEANA WALTS, MRN: [id number],   ? ?Date: 04/05/2021 ? ?Time Spent: 50 mins ? ?Treatment Type: Individual Therapy ? ?Reported Symptoms: Pt presented for a follow up session, via webex video, due to the virus outbreak.  Pt granted consent for the session, stating that she is in her car with no one else present.  I shared with pt that I am in my office at home with no one else here either.  Previous pt of Elisha Ponder; saw her for about 3 yrs.   ? ?Mental Status Exam: ?Appearance:  Casual     ?Behavior: Appropriate  ?Motor: Normal  ?Speech/Language:  Clear and Coherent  ?Affect: Appropriate  ?Mood: normal  ?Thought process: normal  ?Thought content:   WNL  ?Sensory/Perceptual disturbances:   WNL  ?Orientation: oriented to person, place, time/date, and situation  ?Attention: Good  ?Concentration: Good  ?Memory: WNL  ?Fund of knowledge:  Good  ?Insight:   Good  ?Judgment:  Good  ?Impulse Control: Good  ? ?Risk Assessment: ?Danger to Self:  No ?Self-injurious Behavior: No ?Danger to Others: No ?Duty to Warn:no ?Physical Aggression / Violence:No  ?Access to Firearms a concern: No  ?Gang Involvement:No  ? 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  Pt continues to go to PT and she is waiting to find a new physician since the other doctor would not do her injection.  The [facility] nurse is still trying to find another doctor for pt.  Pt shares she is enjoying her new job; "everyone is helpful and I like them so much."  Pt shares that she has gotten her nails done, visited her grandmother last weekend ("nurtured my inner child"), wants to try meditation daily, etc.  Pt shares that she spoke with [name] today and he is doing OK.  She used his situation to use as a speech she gave for one of her classes  this week for school.  Pt feels great compassion for [name].  Pt shares that [name] is doing OK; she had to take his phone away yesterday because he took it to school and was not supposed to have it at school.  Encouraged pt to continue with her self care activities and we will meet in 2 wks for a follow up session. ? ?Interventions: Cognitive Behavioral Therapy ? ?Diagnosis:Adjustment disorder with mixed anxiety and depressed mood ? ?Plan: Treatment Plan ?Strengths/Abilities:  Intelligent, Intuitive, Willing to participate in therapy ?Treatment Preferences:  Outpatient Individual Therapy ?Statement of Needs:  Patient is to use CBT, mindfulness and coping skills to help manage and/or decrease symptoms associated with their diagnosis. ?Symptoms:  Depressed/Irritable mood, worry, social withdrawal ?Problems Addressed:  Depressive thoughts, Sadness, Sleep issues, etc. ?Long Term Goals:  Pt to reduce overall level, frequency, and intensity of the feelings of depression/anxiety as evidenced by decreased irritability, negative self talk, and helpless feelings from 6 to 7 days/week to 0 to 1 days/week, per client report, for at least 3 consecutive months.  Progress: 20% ?Short Term Goals:  Pt to verbally express understanding of the relationship between feelings of depression/anxiety and their impact on thinking patterns and behaviors.  Pt to verbalize an understanding of the role that distorted thinking plays in creating fears, excessive worry, and ruminations.  Progress: 20% ?Target Date:  12/28/2021 ?Frequency:  Bi-weekly ?Modality:  Cognitive Behavioral Therapy ?Interventions by Therapist:  Therapist will use CBT, Mindfulness exercises, Coping skills and Referrals, as needed by client. ?Client has verbally approved this treatment plan. ? ?Karie Kirks, [facility] ?

## 2021-04-08 ENCOUNTER — Ambulatory Visit: Payer: PRIVATE HEALTH INSURANCE | Admitting: Physical Therapy

## 2021-04-09 ENCOUNTER — Other Ambulatory Visit: Payer: Self-pay

## 2021-04-10 ENCOUNTER — Ambulatory Visit: Payer: No Typology Code available for payment source | Admitting: Physical Therapy

## 2021-04-11 ENCOUNTER — Other Ambulatory Visit: Payer: Self-pay

## 2021-04-15 ENCOUNTER — Other Ambulatory Visit: Payer: Self-pay

## 2021-04-15 ENCOUNTER — Encounter: Payer: No Typology Code available for payment source | Admitting: Physical Therapy

## 2021-04-17 ENCOUNTER — Ambulatory Visit: Payer: PRIVATE HEALTH INSURANCE | Attending: Surgical

## 2021-04-17 ENCOUNTER — Encounter: Payer: Self-pay | Admitting: Physical Therapy

## 2021-04-17 ENCOUNTER — Ambulatory Visit (INDEPENDENT_AMBULATORY_CARE_PROVIDER_SITE_OTHER): Payer: No Typology Code available for payment source | Admitting: Psychology

## 2021-04-17 DIAGNOSIS — F4323 Adjustment disorder with mixed anxiety and depressed mood: Secondary | ICD-10-CM

## 2021-04-17 DIAGNOSIS — M25511 Pain in right shoulder: Secondary | ICD-10-CM | POA: Insufficient documentation

## 2021-04-17 DIAGNOSIS — M6281 Muscle weakness (generalized): Secondary | ICD-10-CM | POA: Diagnosis present

## 2021-04-17 NOTE — Therapy (Signed)
?OUTPATIENT PHYSICAL THERAPY TREATMENT NOTE ? ? ?Patient Name: Crystal Diaz ?MRN: [id number] ?DOB:June 18, 1990, 31 y.o., female ?Today's Date: 04/17/2021 ? ?PCP: Doreene Nest, NP ?REFERRING PROVIDER: Fredia Sorrow, PA-C ? ? PT End of Session - 04/17/21 1753   ? ? Visit Number 16   ? Number of Visits 24   ? Date for PT Re-Evaluation 04/27/21   ? Authorization Type Worker's Comp: approved for 1 evaluation and 24 treatments   ? Authorization Time Period Cert 02/21/49-8/84/16   ? Authorization - Visit Number 16   ? Authorization - Number of Visits 24   ? Progress Note Due on Visit 20   ? PT Start Time 1749   ? PT Stop Time 1830   ? PT Time Calculation (min) 41 min   ? Activity Tolerance Patient tolerated treatment well;No increased pain   ? Behavior During Therapy [facility] for tasks assessed/performed   ? ?  ?  ? ?  ? ? ?Past Medical History:  ?Diagnosis Date  ? Anemia   ? Anxiety   ? GERD (gastroesophageal reflux disease)   ? Headache   ? Lumbar radiculopathy   ? PCOS (polycystic ovarian syndrome)   ? Plantar fasciitis, bilateral   ? ?Past Surgical History:  ?Procedure Laterality Date  ? FOOT SURGERY Right   ? 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 Procedure: LAPAROSCOPIC ROUX-EN-Y GASTRIC BYPASS WITH HIATAL HERNIA REPAIR AND UPPER ENDOSCOPY;  Surgeon: Luretha Murphy, MD;  Location: [facility];  Service: General;  Laterality: N/A;  ? WISDOM TOOTH EXTRACTION    ? ?Patient Active Problem List  ? Diagnosis Date Noted  ? Vitamin D deficiency 06/08/2020  ? Abnormal uterine bleeding 12/02/2019  ? Screening for STD (sexually transmitted disease) 10/19/2018  ? Encounter for birth control 03/23/2018  ? GERD (gastroesophageal reflux disease) 03/23/2018  ? Preventative health care 03/23/2018  ? Vitamin B12 deficiency due to intestinal malabsorption 12/16/2017  ? GAD (generalized anxiety disorder) 11/25/2017  ? 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 ? ?REFERRING DIAG: Right shoulder pain  ? ?THERAPY DIAG:  ?Acute pain of right  shoulder ? ?Muscle weakness (generalized) ? ?PERTINENT HISTORY: Pt is a 31yo. female referred to OPPT for R shoulder/arm/ and periscapular pain. PMH includes: Anemia, anxiety, GERD, lumbar radiculopathy, Polycystic ovarian syndrome, B plantar fasciitis. Pt reports onset of ~3 weeks ago with N/T down into RUE and hand. 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 MOI reported as an upward pull on R shoulder in axillary region causing GHJ elevation. Does reports symptoms as burning, N/T, throbbing at times in shoulder/periscapular region. Demonstrates tightness with cervial mobility but no reproduction of symptoms down RLE. typically hovers at 4/10 NPS, 7/10 NPS is worst. Currently a 6/10 NPS. Reports pain along posterior, lateral RUE onto dorsal R hand along4th and 5th finger. Reports symptoms improved with heat, prednisone. Pain is worse with shoulder mobility with getting dressed, driving, with RUE in dependent position worsens symptoms. Pt's goal is to improve shoulder pain, N/T symptoms.  ? ?PRECAUTIONS: None  ? ?SUBJECTIVE: Pt reports more nerve related pain than muscular pain. Thinks due to typing at work. Has been avoiding overhead motion. ?PAIN:  ?Are you having pain? Yes: NPRS scale: 4/10 ?Pain location: right shoulder, anterior  ?Pain description: burning and achy ?Aggravating factors: Reaching overhead  ?Relieving factors: Keeping arm by her side  ? ? ? ? ?TODAY'S TREATMENT:  ?  04/17/21 ?There.ex: ? UBE: L3, 7 min forward for LE warm up ? ? Standing UE ranger AAROM > 100 deg: ?  Scaption: 2x15 ?  Abduction: 2x15 ? ? Scap retractions with GTB: 3x8, mod VC's for eccentric control. ? ? Seated R shoulder ER/IR with RTB: 2x8 with towel b/t torso and elbow. Mod VC's for form/technique with exercises and eccentric control ? ?   Standing yellow body blade shoulder overhead shoulder flexion: 3x12. ? ? ?PATIENT  EDUCATION: ?Education details: form and technique for appropriate exercise  ?Person educated: Patient ?Education method: Explanation, Demonstration, Verbal cues, and Handouts ?Education comprehension: verbalized understanding, returned demonstration, and verbal cues required ? ? ?HOME EXERCISE PROGRAM: ? ? ?Access Code: [email or web address] ?URL: [email or web address] ?Date: 04/03/2021 ?Prepared by: Ellin Goodieaniel Fleming ? ?Exercises ?- Seated Shoulder Row with Anchored Resistance  - 1 x daily - 4 x weekly - 2 sets - 10 reps ?- Shoulder Flexion to 75 deg   - 1 x daily - 7 x weekly - 3 sets - 10 reps ?- Shoulder Abduction - Thumbs Up  - 1 x daily - 7 x weekly - 3 sets - 10 reps ? ? ? ? ? PT Short Term Goals    ? ?  ? PT SHORT TERM GOAL #1  ? Title Pt will be independent with HEP to improve pain, AROM, and strength to return to PLOF.   ? Baseline 01/16/2020: initiated   ? Time 4   ? Period Weeks   ? Status Achieved   ? Target Date 02/12/21   ? ?  ?  ? ?  ? ? ? PT Long Term Goals   ? ?  ? PT LONG TERM GOAL #1  ? Title Pt will improve FOTO score to target to demo clinically significant improvement in functional mobility.   ? Baseline 01/16/20: 41 with target of 60; 02/13/21: 63   ? Time 8   ? Period Weeks   ? Status Achieved   ? Target Date 03/12/21   ?  ? PT LONG TERM GOAL #2  ? Title Pt will improve shoulder AROM to at least >160 degrees to demo ability to perform overhead ADL's.   ? Baseline 1/3: 91 deg elevation, 86 abduction; ~160 bilat   ? Time 8   ? Period Weeks   ? Status On-going   ? Target Date 03/12/21   ?  ? PT LONG TERM GOAL #3  ? Title Pt will improve pain to < 5/10 NPS to shoulder motion to demo improved pain with functional mobility for ADL completion.   ? Baseline 01/15/21: Up to 7/10 NPS with shoulder motion; 2/1: mostly 4/10 with brief spikes 3/15: 6/10   ? Time 8   ? Period Weeks   ? Status On-going   ? Target Date 03/12/21   ? ?  ?  ? ?  ? ? Plan - 04/17/21 1805   ? ? Clinical Impression Statement Continuing PT  POC with focus on graded overhead mobility. Trialed AAROM with UE ranger tolerating greater ranges of motion compared to active motion with reports of some burning pain but primarily heaviness of LUE. COntinuing periscapular strength with progression of resistance with need for VC's for improving eccentric contorl due to weakness. Pt will continue to benefit from skilled services to progress overhead mobility with less pain.   ? Personal Factors and Comorbidities Age;Time since onset of injury/illness/exacerbation;Comorbidity 3+;Education;Fitness;Profession   ? Comorbidities Anemia, anxiety, GERD, lumbar radiculopathy, Polycystic ovarian syndrome, B plantar  fasciitis.   ? Examination-Activity Limitations Reach Overhead;Carry;Dressing;Sleep;Sit;Transfers;Toileting;Stand   ? Examination-Participation Restrictions Community Activity;Driving;Occupation;[employer] Work   ? Stability/Clinical Decision Making Evolving/Moderate complexity   ? Rehab Potential Good   ? PT Frequency 2x / week   ? PT Duration 8 weeks   ? PT Treatment/Interventions ADLs/Self Care Home Management;Aquatic Therapy;Cryotherapy;Electrical Stimulation;Moist Heat;Traction;Gait training;Functional mobility training;Therapeutic activities;Patient/family education;Therapeutic exercise;Balance training;Neuromuscular re-education;Manual techniques;Passive range of motion;Dry needling;Spinal Manipulations;Joint Manipulations   ? PT Next Visit Plan Progress shoulder ROM and strength with graded activity.   ? PT Home Exercise Plan Access Code: [email or web address] (update next session to incorporate more focal shoulder extension and external rotation isometric or isotonic in a tolerated range)   ? Consulted and Agree with Plan of Care Patient   ? ?  ?  ? ?  ?  ? ?Delphia Grates. Fairly IV, PT, DPT ?Physical Therapist- [place]  ?[facility]  ?04/17/2021, 6:32 PM ?

## 2021-04-17 NOTE — Progress Notes (Signed)
[place] Counselor/Therapist Progress Note ? ?Patient ID: Crystal Diaz, MRN: [id number],   ? ?Date: 04/17/2021 ? ?Time Spent: 50 mins ? ?Treatment Type: Individual Therapy ? ?Reported Symptoms: Pt presented for a follow up session, via telephone, due to the virus outbreak.  Pt granted consent for the session, stating that she is in her car with no one else present.  I shared with pt that I am in my office at home with no one else here either.  Previous pt of [name]; saw her for about 3 yrs.   ? ?Mental Status Exam: ?Appearance:  Casual     ?Behavior: Appropriate  ?Motor: Normal  ?Speech/Language:  Clear and Coherent  ?Affect: Appropriate  ?Mood: normal  ?Thought process: normal  ?Thought content:   WNL  ?Sensory/Perceptual disturbances:   WNL  ?Orientation: oriented to person, place, time/date, and situation  ?Attention: Good  ?Concentration: Good  ?Memory: WNL  ?Fund of knowledge:  Good  ?Insight:   Good  ?Judgment:  Good  ?Impulse Control: Good  ? ?Risk Assessment: ?Danger to Self:  No ?Self-injurious Behavior: No ?Danger to Others: No ?Duty to Warn:no ?Physical Aggression / Violence:No  ?Access to Firearms a concern: No  ?Gang Involvement:No  ? ?Subjective: Pt shares that she is frustrated and upset by something that happened at work today; she feels like she was being asked to do things incorrectly.  She wants to talk with her mgr about how best to accomplish the goal.  She is able to calm herself down during the session.  She shares that she has gotten her eyelashes done, got her nails done, got her eyebrows  done, etc.  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  Pt continues to do well in school; she is working on a persuasive speech now; she has already done an informational speech on her brother's condition.  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  I look forward to going to work now."  Pt talked to [place] on Sunday; he appeared to be struggling with his circumstance; he  has a new attorney and that person is looking into [name]'s mental illness.  Pt shares that [name] is doing OK.  Encouraged pt to continue with her self care activities and we will meet in 2 wks for a follow up session. ? ?Interventions: Cognitive Behavioral Therapy ? ?Diagnosis:Adjustment disorder with mixed anxiety and depressed mood ? ?Plan: Treatment Plan ?Strengths/Abilities:  Intelligent, Intuitive, Willing to participate in therapy ?Treatment Preferences:  Outpatient Individual Therapy ?Statement of Needs:  Patient is to use CBT, mindfulness and coping skills to help manage and/or decrease symptoms associated with their diagnosis. ?Symptoms:  Depressed/Irritable mood, worry, social withdrawal ?Problems Addressed:  Depressive thoughts, Sadness, Sleep issues, etc. ?Long Term Goals:  Pt to reduce overall level, frequency, and intensity of the feelings of depression/anxiety as evidenced by decreased irritability, negative self talk, and helpless feelings from 6 to 7 days/week to 0 to 1 days/week, per client report, for at least 3 consecutive months.  Progress: 20% ?Short Term Goals:  Pt to verbally express understanding of the relationship between feelings of depression/anxiety and their impact on thinking patterns and behaviors.  Pt to verbalize an understanding of the role that distorted thinking plays in creating fears, excessive worry, and ruminations.  Progress: 20% ?Target Date:  12/28/2021 ?Frequency:  Bi-weekly ?Modality:  Cognitive Behavioral Therapy ?Interventions by Therapist:  Therapist will use CBT, Mindfulness exercises, Coping skills and Referrals, as needed by client. ?  Client has verbally approved this treatment plan. ? ?Ivan Anchors, [facility] ?

## 2021-04-22 ENCOUNTER — Encounter: Payer: No Typology Code available for payment source | Admitting: Physical Therapy

## 2021-04-24 ENCOUNTER — Ambulatory Visit: Payer: PRIVATE HEALTH INSURANCE | Attending: Surgical | Admitting: Physical Therapy

## 2021-04-24 ENCOUNTER — Encounter: Payer: Self-pay | Admitting: Physical Therapy

## 2021-04-24 ENCOUNTER — Ambulatory Visit: Payer: PRIVATE HEALTH INSURANCE | Admitting: Physical Therapy

## 2021-04-24 DIAGNOSIS — M25511 Pain in right shoulder: Secondary | ICD-10-CM | POA: Insufficient documentation

## 2021-04-24 DIAGNOSIS — M6281 Muscle weakness (generalized): Secondary | ICD-10-CM | POA: Diagnosis present

## 2021-04-24 NOTE — Therapy (Addendum)
?OUTPATIENT PHYSICAL THERAPY TREATMENT NOTE/ Re-Certification  ? ?Dates of Reporting: 01/15/21-04/27/21 ? ?Patient Name: Crystal Diaz ?MRN: [id number] ?DOB:Feb 24, 1990, 31 y.o., female ?Today's Date: 04/24/2021 ? ?PCP: Pleas Koch, NP ?REFERRING PROVIDER: Sheryle Hail, PA-C ? ? PT End of Session - 04/24/21 1809   ? ? Visit Number 17   ? Number of Visits 24   ? Date for PT Re-Evaluation 04/27/21   ? Authorization Type Worker's Comp: approved for 1 evaluation and 24 treatments   ? Authorization Time Period Cert 0/1/09-04/04/53   ? Authorization - Visit Number 17   ? Authorization - Number of Visits 24   ? Progress Note Due on Visit 20   ? PT Start Time 1803   ? PT Stop Time 7322   ? PT Time Calculation (min) 42 min   ? Activity Tolerance Patient tolerated treatment well;No increased pain   ? Behavior During Therapy [facility] for tasks assessed/performed   ? ?  ?  ? ?  ? ? ?Past Medical History:  ?Diagnosis Date  ? Anemia   ? Anxiety   ? GERD (gastroesophageal reflux disease)   ? Headache   ? Lumbar radiculopathy   ? PCOS (polycystic ovarian syndrome)   ? Plantar fasciitis, bilateral   ? ?Past Surgical History:  ?Procedure Laterality Date  ? FOOT SURGERY Right   ? 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 Procedure: LAPAROSCOPIC ROUX-EN-Y GASTRIC BYPASS WITH HIATAL HERNIA REPAIR AND UPPER ENDOSCOPY;  Surgeon: Johnathan Hausen, MD;  Location: [facility];  Service: General;  Laterality: N/A;  ? WISDOM TOOTH EXTRACTION    ? ?Patient Active Problem List  ? Diagnosis Date Noted  ? Vitamin D deficiency 06/08/2020  ? Abnormal uterine bleeding 12/02/2019  ? Screening for STD (sexually transmitted disease) 10/19/2018  ? Encounter for birth control 03/23/2018  ? GERD (gastroesophageal reflux disease) 03/23/2018  ? Preventative health care 03/23/2018  ? Vitamin B12 deficiency due to intestinal malabsorption 12/16/2017  ? GAD (generalized anxiety disorder) 11/25/2017  ? 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 ? ?REFERRING DIAG: Right shoulder pain   ? ?THERAPY DIAG:  ?Acute pain of right shoulder ? ?Muscle weakness (generalized) ? ?PERTINENT HISTORY: Pt is a 31yo. female referred to OPPT for R shoulder/arm/ and periscapular pain. PMH includes: Anemia, anxiety, GERD, lumbar radiculopathy, Polycystic ovarian syndrome, B plantar fasciitis. Pt reports onset of ~3 weeks ago with N/T down into RUE and hand. 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 MOI reported as an upward pull on R shoulder in axillary region causing GHJ elevation. Does reports symptoms as burning, N/T, throbbing at times in shoulder/periscapular region. Demonstrates tightness with cervial mobility but no reproduction of symptoms down RLE. typically hovers at 4/10 NPS, 7/10 NPS is worst. Currently a 6/10 NPS. Reports pain along posterior, lateral RUE onto dorsal R hand along4th and 5th finger. Reports symptoms improved with heat, prednisone. Pain is worse with shoulder mobility with getting dressed, driving, with RUE in dependent position worsens symptoms. Pt's goal is to improve shoulder pain, N/T symptoms.  ? ?PRECAUTIONS: None  ? ?SUBJECTIVE: Pt continues to experience pain in left shoulder after reaching terminal flexion and then extending shoulder. She notes that she still has been able to consistently perform her exercises and job tasks despite pain and discomfort.  ? ?PAIN:  ?Are you having pain? Yes: NPRS scale: 5/10 ?Pain location: right shoulder, anterior  ?Pain  description: burning and achy ?Aggravating factors: Reaching overhead and when adducting arm   ?Relieving factors: Keeping arm by her side  ? ? ? ?OBJECTIVE:  ? ?TODAY'S TREATMENT:  ?04/24/21 ?Shoulder AAROM Flexion/Extension 3 x 30  ? ?Shoulder AAROM Abduction/ Adduction 3 x 30   ? ?Shoulder AROM  ?-Flexion R/L 180/180 ?-Abduction R/L 180/180  ? ?Shoulder Flexion #1 DB bilateral 3 x 10  ?Shoulder Banded T's with #1 DB  bilateral 3 x 10  ? ? ?04/17/21 ?There.ex: ? UBE: L3, 7 min forward for LE warm up ? ? Standing UE ranger AAROM > 100 deg: ?  Scaption: 2x15 ?  Abduction: 2x15 ? ? Scap retractions with GTB: 3x8, mod VC's for eccentric control. ? ? Seated R shoulder ER/IR with RTB: 2x8 with towel b/t torso and elbow. Mod VC's for form/technique with exercises and eccentric control ? ?   Standing yellow body blade shoulder overhead shoulder flexion: 3x12. ? ? ?PATIENT EDUCATION: ?Education details: form and technique for appropriate exercise  ?Person educated: Patient ?Education method: Explanation, Demonstration, Verbal cues, and Handouts ?Education comprehension: verbalized understanding, returned demonstration, and verbal cues required ? ? ?HOME EXERCISE PROGRAM: ? ? ?Access Code: [email or web address] ?URL: [email or web address] ?Date: 04/24/2021 ?Prepared by: Bradly Chris ? ?Exercises ?- Seated Shoulder Row with Anchored Resistance  - 1 x daily - 3 x weekly - 3 sets - 10 reps ?- Shoulder Abduction - Thumbs Up  - 1 x daily - 3 x weekly - 3 sets - 10 reps ?- Standing Shoulder Flexion with Resistance  - 1 x daily - 3 x weekly - 3 sets - 10 reps ?- Standing Shoulder Horizontal Abduction with Resistance  - 1 x daily - 3 x weekly - 3 sets - 10 reps ? ? ? ? PT Short Term Goals    ? ?  ? PT SHORT TERM GOAL #1  ? Title Pt will be independent with HEP to improve pain, AROM, and strength to return to PLOF.   ? Baseline 01/16/2020: initiated   ? Time 4   ? Period Weeks   ? Status Achieved   ? Target Date 02/12/21   ? ?  ?  ? ?  ? ? ? PT Long Term Goals   ? ?  ? PT LONG TERM GOAL #1  ? Title Pt will improve FOTO score to target to demo clinically significant improvement in functional mobility.   ? Baseline 01/16/20: 41 with target of 60; 02/13/21: 63   ? Time 8   ? Period Weeks   ? Status Achieved   ? Target Date 03/12/21   ?  ? PT LONG TERM GOAL #2  ? Title Pt will improve shoulder AROM to at least >160 degrees to demo ability to perform  overhead ADL's.   ? Baseline 1/3: 91 deg elevation, 86 abduction; ~160 bilat 4/12: Shoulder Flexion R/L 180/180 Shoulder Abduction R/L 180/180  ? Time 8   ? Period Weeks   ? Status Achieved  ? Target Date 03/12/21   ?  ? PT LONG TERM GOAL #3  ? Title Pt will improve pain to < 5/10 NPS to shoulder motion to demo improved pain with functional mobility for ADL completion.   ? Baseline 01/15/21: Up to 7/10 NPS with shoulder motion; 2/1: mostly 4/10 with brief spikes 3/15: 6/10 04/24/21: 5/10  ? Time 8   ? Period Weeks   ? Status On-going   ? Target Date 07/28/21   ? ?  ?  ? ?  ? ?  Plan - 04/17/21 1805   ? ? Clinical Impression Statement Pt exhibits improved shoulder AROM with ability to perform resisted shoulder flexion and abduction close to end range of motion. She has met nearly all of her goals with the exception of reducing her baseline pain to <=5/10. She will continue to benefit from skilled PT to consistently preform overhead activity with left shoulder without increased pain or discomfort and to carry out overhead movements with left shoulder with increased resistance to be able to lift objects for work and while cleaning and providing care for her child. She has remained motivated and demonstrated consistent follow through with completing her HEP. ? ?  ? Personal Factors and Comorbidities Age;Time since onset of injury/illness/exacerbation;Comorbidity 3+;Education;Fitness;Profession   ? Comorbidities Anemia, anxiety, GERD, lumbar radiculopathy, Polycystic ovarian syndrome, B plantar fasciitis.   ? Examination-Activity Limitations Reach Overhead;Carry;Dressing;Sleep;Sit;Transfers;Toileting;Stand   ? Examination-Participation Restrictions Community Activity;Driving;Occupation;[employer] Work   ? Stability/Clinical Decision Making Evolving/Moderate complexity   ? Rehab Potential Good   ? PT Frequency 2x / week   ? PT Duration 8 weeks   ? PT Treatment/Interventions ADLs/Self Care Home Management;Aquatic  Therapy;Cryotherapy;Electrical Stimulation;Moist Heat;Traction;Gait training;Functional mobility training;Therapeutic activities;Patient/family education;Therapeutic exercise;Balance training;Neuromuscular re-education;Manual techn

## 2021-04-26 ENCOUNTER — Encounter: Payer: Self-pay | Admitting: Primary Care

## 2021-04-26 ENCOUNTER — Ambulatory Visit (INDEPENDENT_AMBULATORY_CARE_PROVIDER_SITE_OTHER): Payer: No Typology Code available for payment source | Admitting: Primary Care

## 2021-04-26 DIAGNOSIS — F331 Major depressive disorder, recurrent, moderate: Secondary | ICD-10-CM

## 2021-04-26 DIAGNOSIS — F411 Generalized anxiety disorder: Secondary | ICD-10-CM

## 2021-04-26 DIAGNOSIS — N898 Other specified noninflammatory disorders of vagina: Secondary | ICD-10-CM | POA: Insufficient documentation

## 2021-04-26 DIAGNOSIS — F329 Major depressive disorder, single episode, unspecified: Secondary | ICD-10-CM | POA: Insufficient documentation

## 2021-04-26 NOTE — Progress Notes (Signed)
? ?Subjective:  ? ? Patient ID: Crystal Diaz, female    DOB: 06-06-1990, 31 y.o.   MRN: [id number] ? ?HPI ? ?Crystal Diaz is a very pleasant 31 y.o. female with a history of anxiety disorder, gastric bypass, GERD, vitamin D deficiency, vitamin B12 deficiency who presents today to discuss anxiety and depression and vaginal discharge.   ? ?1) Anxiety and Depression:  ? ?Chronic history of anxiety.  Previously following with therapy.  Previously managed on several medications at various times including venlafaxine, Zoloft, Lexapro.  History of inconsistent use of medications and follow-up with therapy. ? ?Today she endorses daily symptoms of anxiety and depression including feeling depressed, feeling down, worrying, anger, doesn't want to trust anyone. She continues to grieve the loss of her mother and misses the closeness of her family. She doesn't feel present with her son.  ? ?She has little support. Her younger brother has schizophrenia, is not medicated and has made some bad life decisions, is now in prison. She is now her younger brother's guardian. Her older brother lives out of state. Her job was eliminated in February 2023, found a new position and is trying to acclimate.  ? ?She is following with therapy regularly and has made a good connection. She has never seen a psychiatrist.  ? ?2) Vaginal Discharge: Also with foul smell to discharge with a light/grey yellow color. Symptoms began a few months ago. She is sexually active with one partner, wears condoms. Her partner does not have symptoms.  ? ?BP Readings from Last 3 Encounters:  ?04/26/21 106/72  ?02/05/21 129/79  ?10/03/20 99/66  ? ? ? ?  04/26/2021  ? 11:42 AM 02/06/2020  ? 12:01 PM 10/27/2019  ? 10:00 AM 11/25/2017  ?  2:38 PM  ?GAD 7 : Generalized Anxiety Score  ?Nervous, Anxious, on Edge 3 2 3 3   ?Control/stop worrying 3 3 3 3   ?Worry too much - different things 3 1 3 3   ?Trouble relaxing 3 1 3 2   ?Restless 2 0 0 2  ?Easily annoyed or irritable 3  1 3 2   ?Afraid - awful might happen 2 0 3 2  ?Total GAD 7 Score 19 8 18 17   ?Anxiety Difficulty Extremely difficult Somewhat difficult Extremely difficult Somewhat difficult  ? ? ?Flowsheet Row Office Visit from 04/26/2021 in [facility] [place]  ?PHQ-9 Total Score 18  ? ?  ? ? ? ?Review of Systems  ?Respiratory:  Negative for shortness of breath.   ?Cardiovascular:  Negative for chest pain.  ?Genitourinary:  Positive for vaginal discharge. Negative for dysuria, frequency and hematuria.  ?Psychiatric/Behavioral:  Positive for sleep disturbance. The patient is nervous/anxious.   ?     See HPI  ? ?   ? ? ?Past Medical History:  ?Diagnosis Date  ? Anemia   ? Anxiety   ? GERD (gastroesophageal reflux disease)   ? Headache   ? Lumbar radiculopathy   ? PCOS (polycystic ovarian syndrome)   ? Plantar fasciitis, bilateral   ? ? ?Social History  ? ?Socioeconomic History  ? Marital status: Single  ?  Spouse name: Not on file  ? Number of children: Not on file  ? Years of education: Not on file  ? Highest education level: Not on file  ?Occupational History  ? Not on file  ?Tobacco Use  ? Smoking status: Former  ?  Packs/day: 0.25  ?  Years: 4.00  ?  Pack years: 1.00  ?  Types: Cigarettes  ?  Quit date: 01/19/2017  ?  Years since quitting: 4.2  ? Smokeless tobacco: Never  ?Vaping Use  ? Vaping Use: Never used  ?Substance and Sexual Activity  ? Alcohol use: Yes  ?  Alcohol/week: 0.0 standard drinks  ?  Comment: occ  ? Drug use: No  ? Sexual activity: Yes  ?  Partners: Male  ?  Birth control/protection: I.U.D.  ?Other Topics Concern  ? Not on file  ?Social History Narrative  ? Single. In a relationship.   ? One son.  ? Works as Geographical information systems officer.   ? Enjoys spending time with her son, traveling.  ? ?Social Determinants of Health  ? ?Financial Resource Strain: Not on file  ?Food Insecurity: Not on file  ?Transportation Needs: Not on file  ?Physical Activity: Not on file  ?Stress: Not on file  ?Social Connections: Not on  file  ?Intimate Partner Violence: Not on file  ? ? ?Past Surgical History:  ?Procedure Laterality Date  ? FOOT SURGERY Right   ? 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 Procedure: LAPAROSCOPIC ROUX-EN-Y GASTRIC BYPASS WITH HIATAL HERNIA REPAIR AND UPPER ENDOSCOPY;  Surgeon: Luretha Murphy, MD;  Location: [facility];  Service: General;  Laterality: N/A;  ? WISDOM TOOTH EXTRACTION    ? ? ?Family History  ?Problem Relation Age of Onset  ? Diabetes Mother   ? Pancreatic cancer Father 5  ? Cancer Brother   ?     Oral  ? ? ?No Known Allergies ? ?Current Outpatient Medications on File Prior to Visit  ?Medication Sig Dispense Refill  ? cyanocobalamin (,VITAMIN B-12,) 1000 MCG/ML injection INJECT 1 ML (1,000 MCG TOTAL) INTO THE MUSCLE EVERY 30 (THIRTY) DAYS. 3 mL 1  ? desogestrel-ethinyl estradiol (MIRCETTE) 0.15-0.02/0.01 MG (21/5) tablet Take 1 tablet by mouth daily. 84 tablet 2  ? pantoprazole (PROTONIX) 40 MG tablet Take 1 tablet (40 mg total) by mouth daily. 90 tablet 3  ? pantoprazole (PROTONIX) 40 MG tablet Take 1 tablet by mouth daily. 30 tablet 11  ? SYRINGE/NEEDLE, DISP, 1 ML 23G X 1" 1 ML MISC Use once monthly with vitamin B12 vial. 12 each 0  ? Vitamin D, Ergocalciferol, (DRISDOL) 1.25 MG (50000 UNIT) CAPS capsule Take 1 capsule (50,000 Units total) by mouth once a week. 12 capsule 1  ? ?No current facility-administered medications on file prior to visit.  ? ? ?BP 106/72   Pulse 100   Temp 98.7 ?F (37.1 ?C) (Temporal)   Ht 5\' 9"  (1.753 m)   Wt 227 lb (103 kg)   SpO2 100%   BMI 33.52 kg/m?  ?Objective:  ? Physical Exam ?Cardiovascular:  ?   Rate and Rhythm: Normal rate and regular rhythm.  ?Pulmonary:  ?   Effort: Pulmonary effort is normal.  ?   Breath sounds: Normal breath sounds.  ?Musculoskeletal:  ?   Cervical back: Neck supple.  ?Skin: ?   General: Skin is warm and dry.  ?Psychiatric:  ?   Comments: Tearful during visit   ? ? ? ? ? ?   ?Assessment & Plan:  ? ? ? ? ?This visit occurred during the SARS-CoV-2  public health emergency.  Safety protocols were in place, including screening questions prior to the visit, additional usage of staff PPE, and extensive cleaning of exam room while observing appropriate contact time as indicated for disinfecting solutions.  ?

## 2021-04-26 NOTE — Assessment & Plan Note (Signed)
Uncontrolled. ? ?Failed treatment on numerous medications.  ? ?Continue therapy. ?Referral placed to psychiatry. ? ?40 minutes spent with patient face-to-face with patient listening to her story, discussing options for treatment, discussing prior treatment.  ?

## 2021-04-26 NOTE — Patient Instructions (Addendum)
You will be contacted regarding your referral to psychiatry.  Please let us know if you have not been contacted within two weeks.  ? ?Continue regular therapy sessions.  ? ?It was a pleasure to see you today! ? ? ?

## 2021-04-26 NOTE — Assessment & Plan Note (Signed)
Prior history of BV.  ? ?Declines vaginal exam today. ?She will schedule an appointment with her GYN. ?

## 2021-04-29 ENCOUNTER — Ambulatory Visit: Payer: No Typology Code available for payment source

## 2021-04-30 ENCOUNTER — Ambulatory Visit: Payer: No Typology Code available for payment source | Admitting: Obstetrics and Gynecology

## 2021-04-30 ENCOUNTER — Other Ambulatory Visit (HOSPITAL_COMMUNITY)
Admission: RE | Admit: 2021-04-30 | Discharge: 2021-04-30 | Disposition: A | Discharge status: Home / Self Care | Payer: No Typology Code available for payment source | Source: Ambulatory Visit | Attending: Obstetrics and Gynecology | Admitting: Obstetrics and Gynecology

## 2021-04-30 ENCOUNTER — Encounter: Payer: Self-pay | Admitting: Obstetrics and Gynecology

## 2021-04-30 DIAGNOSIS — N898 Other specified noninflammatory disorders of vagina: Secondary | ICD-10-CM | POA: Insufficient documentation

## 2021-04-30 NOTE — Progress Notes (Signed)
Patient presents today for a self-swab BV/Yeast Culture. She states her symptoms of vaginal odor and cloudy discharge started approximately 3 weeks ago. She states these are the same symptoms she experienced last year with BV. Patients questions were answered.  ?

## 2021-05-01 ENCOUNTER — Encounter: Payer: Self-pay | Admitting: Obstetrics and Gynecology

## 2021-05-01 LAB — CERVICOVAGINAL ANCILLARY ONLY
Bacterial Vaginitis (gardnerella): NEGATIVE
Candida Glabrata: NEGATIVE
Candida Vaginitis: NEGATIVE
Comment: NEGATIVE
Comment: NEGATIVE
Comment: NEGATIVE

## 2021-05-02 ENCOUNTER — Ambulatory Visit: Payer: No Typology Code available for payment source | Admitting: Psychology

## 2021-05-03 ENCOUNTER — Ambulatory Visit: Payer: No Typology Code available for payment source | Admitting: Psychology

## 2021-05-13 ENCOUNTER — Other Ambulatory Visit: Payer: Self-pay

## 2021-05-17 ENCOUNTER — Ambulatory Visit: Payer: No Typology Code available for payment source | Admitting: Psychology

## 2021-05-22 ENCOUNTER — Encounter: Payer: Self-pay | Admitting: Physical Therapy

## 2021-05-22 ENCOUNTER — Ambulatory Visit: Payer: PRIVATE HEALTH INSURANCE | Attending: Surgical | Admitting: Physical Therapy

## 2021-05-22 DIAGNOSIS — M6281 Muscle weakness (generalized): Secondary | ICD-10-CM

## 2021-05-22 DIAGNOSIS — M25511 Pain in right shoulder: Secondary | ICD-10-CM | POA: Diagnosis present

## 2021-05-22 NOTE — Addendum Note (Signed)
Addended by: Daneil Dan on: 05/22/2021 06:50 PM ? ? Modules accepted: Orders ? ?

## 2021-05-22 NOTE — Therapy (Addendum)
?OUTPATIENT PHYSICAL THERAPY TREATMENT NOTE ? ? ?Patient Name: Crystal Diaz ?MRN: [id number] ?DOB:05/07/1990, 31 y.o., female ?Today's Date: 05/22/2021 ? ?PCP: Doreene Nest, NP ?REFERRING PROVIDER: Fredia Sorrow, PA-C ? ? PT End of Session - 05/22/21 1807   ? ? Visit Number 17   ? Number of Visits 24   ? Date for PT Re-Evaluation 04/27/21   ? Authorization Type Worker's Comp: approved for 1 evaluation and 24 treatments   ? Authorization Time Period Cert 7/98/92-02/01/39   ? Authorization - Visit Number 17   ? Authorization - Number of Visits 24   ? Progress Note Due on Visit 20   ? Activity Tolerance Patient tolerated treatment well;No increased pain   ? Behavior During Therapy [facility] for tasks assessed/performed   ? ?  ?  ? ?  ? ? ?Past Medical History:  ?Diagnosis Date  ? Anemia   ? Anxiety   ? GERD (gastroesophageal reflux disease)   ? Headache   ? Lumbar radiculopathy   ? PCOS (polycystic ovarian syndrome)   ? Plantar fasciitis, bilateral   ? ?Past Surgical History:  ?Procedure Laterality Date  ? FOOT SURGERY Right   ? 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 Procedure: LAPAROSCOPIC ROUX-EN-Y GASTRIC BYPASS WITH HIATAL HERNIA REPAIR AND UPPER ENDOSCOPY;  Surgeon: Luretha Murphy, MD;  Location: [facility];  Service: General;  Laterality: N/A;  ? WISDOM TOOTH EXTRACTION    ? ?Patient Active Problem List  ? Diagnosis Date Noted  ? MDD (major depressive disorder) 04/26/2021  ? Vaginal discharge 04/26/2021  ? Vitamin D deficiency 06/08/2020  ? Abnormal uterine bleeding 12/02/2019  ? Screening for STD (sexually transmitted disease) 10/19/2018  ? Encounter for birth control 03/23/2018  ? GERD (gastroesophageal reflux disease) 03/23/2018  ? Preventative health care 03/23/2018  ? Vitamin B12 deficiency due to intestinal malabsorption 12/16/2017  ? GAD (generalized anxiety disorder) 11/25/2017  ? 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 ? ?REFERRING DIAG: Right shoulder pain  ? ?THERAPY DIAG:  ?Acute pain of right  shoulder ? ?Muscle weakness (generalized) ? ?PERTINENT HISTORY: Pt is a 31yo. female referred to OPPT for R shoulder/arm/ and periscapular pain. PMH includes: Anemia, anxiety, GERD, lumbar radiculopathy, Polycystic ovarian syndrome, B plantar fasciitis. Pt reports onset of ~3 weeks ago with N/T down into RUE and hand. 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 MOI reported as an upward pull on R shoulder in axillary region causing GHJ elevation. Does reports symptoms as burning, N/T, throbbing at times in shoulder/periscapular region. Demonstrates tightness with cervial mobility but no reproduction of symptoms down RLE. typically hovers at 4/10 NPS, 7/10 NPS is worst. Currently a 6/10 NPS. Reports pain along posterior, lateral RUE onto dorsal R hand along4th and 5th finger. Reports symptoms improved with heat, prednisone. Pain is worse with shoulder mobility with getting dressed, driving, with RUE in dependent position worsens symptoms. Pt's goal is to improve shoulder pain, N/T symptoms.  ? ?PRECAUTIONS: None  ? ?SUBJECTIVE: Pt recently had a corticosteroid in right shoulder which helped resolved right shoulder pain. She now only feels a slight discomfort in her right shoulder. She will plan on finishing remaining PT visits in her plan of care.  ? ?PAIN:  ?Are you having pain? No ?Pain location: right shoulder, anterior  ?Pain description: burning and achy ?Aggravating factors: Reaching overhead and when adducting arm   ?Relieving factors: Keeping arm  by her side  ? ? ? ? ?TODAY'S TREATMENT:  ?05/22/21  ? ?THEREX  ? ?UBE resistance 3- 5 min  ? ?Shoulder Flexion with #1 DB  ?-Reaches full 180 deg flexion with slight pain coming down into extension at 90 deg  ? ?Shoulder Abduction #1 DB 3 x 10  ?-Able to achieve 180 degrees of abduction  ? ?MANUAL  ? ?Myofascial Release of right parascapular  musculature ?-increased muscular tension in right rhomboid  ? ?04/24/21 ?Shoulder AAROM Flexion/Extension 3 x 30  ? ?Shoulder AAROM Abduction/ Adduction 3 x 30   ? ?Shoulder AROM  ?-Flexion R/L 180/180 ?-Abduction R/L 180/180  ? ?Shoulder Flexion #1 DB bilateral 3 x 10  ?Shoulder Banded T's with #1 DB bilateral 3 x 10  ? ? ?04/17/21 ?There.ex: ? UBE: L3, 7 min forward for LE warm up ? ? Standing UE ranger AAROM > 100 deg: ?  Scaption: 2x15 ?  Abduction: 2x15 ? ? Scap retractions with GTB: 3x8, mod VC's for eccentric control. ? ? Seated R shoulder ER/IR with RTB: 2x8 with towel b/t torso and elbow. Mod VC's for form/technique with exercises and eccentric control ? ?   Standing yellow body blade shoulder overhead shoulder flexion: 3x12. ? ? ?PATIENT EDUCATION: ?Education details: form and technique for appropriate exercise  ?Person educated: Patient ?Education method: Explanation, Demonstration, Verbal cues, and Handouts ?Education comprehension: verbalized understanding, returned demonstration, and verbal cues required ? ? ?HOME EXERCISE PROGRAM: ? ? ?Access Code: [email or web address] ?URL: [email or web address] ?Date: 04/24/2021 ?Prepared by: Ellin Goodie ? ?Exercises ?- Seated Shoulder Row with Anchored Resistance  - 1 x daily - 3 x weekly - 3 sets - 10 reps ?- Shoulder Abduction - Thumbs Up  - 1 x daily - 3 x weekly - 3 sets - 10 reps ?- Standing Shoulder Flexion with Resistance  - 1 x daily - 3 x weekly - 3 sets - 10 reps ?- Standing Shoulder Horizontal Abduction with Resistance  - 1 x daily - 3 x weekly - 3 sets - 10 reps ? ? ? ? PT Short Term Goals    ? ?  ? PT SHORT TERM GOAL #1  ? Title Pt will be independent with HEP to improve pain, AROM, and strength to return to PLOF.   ? Baseline 01/16/2020: initiated   ? Time 4   ? Period Weeks   ? Status Achieved   ? Target Date 02/12/21   ? ?  ?  ? ?  ? ? ? PT Long Term Goals   ? ?  ? PT LONG TERM GOAL #1  ? Title Pt will improve FOTO score to target to demo clinically  significant improvement in functional mobility.   ? Baseline 01/16/20: 41 with target of 60; 02/13/21: 63   ? Time 8   ? Period Weeks   ? Status Achieved   ? Target Date 03/12/21   ?  ? PT LONG TERM GOAL #2  ? Title Pt will improve shoulder AROM to at least >160 degrees to demo ability to perform overhead ADL's.   ? Baseline 1/3: 91 deg elevation, 86 abduction; ~160 bilat 4/12: Shoulder Flexion R/L 180/180 Shoulder Abduction R/L 180/180  ? Time 8   ? Period Weeks   ? Status Achieved  ? Target Date 03/12/21   ?  ? PT LONG TERM GOAL #3  ? Title Pt will improve pain to < 5/10 NPS to shoulder motion to demo improved pain with functional mobility for  ADL completion.   ? Baseline 01/15/21: Up to 7/10 NPS with shoulder motion; 2/1: mostly 4/10 with brief spikes 3/15: 6/10 04/24/21: 5/10  ? Time 8   ? Period Weeks   ? Status On-going   ? Target Date 07/28/21   ? ?  ?  ? ?  ? ? Plan   ? ? Clinical Impression Statement Pt exhibits a significant improvement in right shoulder AROM with ability to achieve terminal range of motion with flexion and abduction with no increase in her pain. She was able to perform all exercises without an increase in her pain. She did have increased trigger points in right parascapular musculature that were decreased with MFR with noted relief by patient. Discussed POC for remaining visits and then reassessment next session to determine progress.  ? ?  ? Personal Factors and Comorbidities Age;Time since onset of injury/illness/exacerbation;Comorbidity 3+;Education;Fitness;Profession   ? Comorbidities Anemia, anxiety, GERD, lumbar radiculopathy, Polycystic ovarian syndrome, B plantar fasciitis.   ? Examination-Activity Limitations Reach Overhead;Carry;Dressing;Sleep;Sit;Transfers;Toileting;Stand   ? Examination-Participation Restrictions Community Activity;Driving;Occupation;Pincus [employer] Work   ? Stability/Clinical Decision Making Evolving/Moderate complexity   ? Rehab Potential Good   ? PT Frequency 2x / week   ?  PT Duration 8 weeks   ? PT Treatment/Interventions ADLs/Self Care Home Management;Aquatic Therapy;Cryotherapy;Electrical Stimulation;Moist Heat;Traction;Gait training;Functional mobility training;Therapeutic activities;Patient/family educa

## 2021-05-31 ENCOUNTER — Ambulatory Visit (INDEPENDENT_AMBULATORY_CARE_PROVIDER_SITE_OTHER): Payer: No Typology Code available for payment source | Admitting: Psychology

## 2021-05-31 DIAGNOSIS — F4323 Adjustment disorder with mixed anxiety and depressed mood: Secondary | ICD-10-CM | POA: Diagnosis not present
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
## 2021-06-05 ENCOUNTER — Ambulatory Visit: Payer: PRIVATE HEALTH INSURANCE | Admitting: Physical Therapy

## 2021-06-06 ENCOUNTER — Telehealth: Payer: Self-pay | Admitting: Physical Therapy

## 2021-06-06 NOTE — Telephone Encounter (Signed)
Called pt to inquiry about missed apt. She has migraine and does not feel well. PT informed pt about limited availability of times for her appointments and that she needs to make few appointments she as left. Also advised that if she wants future PT that it would be in her best interest to choose a clinic closer to her work and home which is located close to [place]. Pt is in agreement with this plan and she will keep this in mind for her upcoming physician's appointment if she feels that she needs more PT.

## 2021-06-13 ENCOUNTER — Encounter: Payer: Self-pay | Admitting: Physical Therapy

## 2021-06-13 ENCOUNTER — Ambulatory Visit: Payer: PRIVATE HEALTH INSURANCE | Attending: Surgical | Admitting: Physical Therapy

## 2021-06-13 DIAGNOSIS — M6281 Muscle weakness (generalized): Secondary | ICD-10-CM | POA: Insufficient documentation

## 2021-06-13 DIAGNOSIS — M25511 Pain in right shoulder: Secondary | ICD-10-CM | POA: Diagnosis present

## 2021-06-13 NOTE — Therapy (Signed)
OUTPATIENT PHYSICAL THERAPY TREATMENT NOTE   Patient Name: Crystal Diaz MRN: [id number] DOB:01-29-1990, 31 y.o., female Today's Date: 06/13/2021  PCP: Doreene Nest, NP REFERRING PROVIDER: Doreene Nest, NP   PT End of Session - 05/22/21 1807     Visit Number 19   Number of Visits 24    Date for PT Re-Evaluation 04/27/21    Authorization Type Worker's Comp: approved for 1 evaluation and 24 treatments    Authorization Time Period Cert 09/12/49-7/61/60    Authorization - Visit Number 19   Authorization - Number of Visits 24    Progress Note Due on Visit 20    Activity Tolerance Patient tolerated treatment well;No increased pain    Behavior During Therapy WFL for tasks assessed/performed             Past Medical History:  Diagnosis Date   Anemia    Anxiety    GERD (gastroesophageal reflux disease)    Headache    Lumbar radiculopathy    PCOS (polycystic ovarian syndrome)    Plantar fasciitis, bilateral    Past Surgical History:  Procedure Laterality Date   FOOT SURGERY Right    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   Procedure: LAPAROSCOPIC ROUX-EN-Y GASTRIC BYPASS WITH HIATAL HERNIA REPAIR AND UPPER ENDOSCOPY;  Surgeon: Luretha Murphy, MD;  Location: [facility];  Service: General;  Laterality: N/A;   WISDOM TOOTH EXTRACTION     Patient Active Problem List   Diagnosis Date Noted   MDD (major depressive disorder) 04/26/2021   Vaginal discharge 04/26/2021   Vitamin D deficiency 06/08/2020   Abnormal uterine bleeding 12/02/2019   Screening for STD (sexually transmitted disease) 10/19/2018   Encounter for birth control 03/23/2018   GERD (gastroesophageal reflux disease) 03/23/2018   Preventative health care 03/23/2018   Vitamin B12 deficiency due to intestinal malabsorption 12/16/2017   GAD (generalized anxiety disorder) 11/25/2017   S/P gastric bypass 03/17/2017    REFERRING DIAG: Right shoulder pain   THERAPY DIAG:  Acute pain of right shoulder  Muscle  weakness (generalized)  PERTINENT HISTORY: Pt is a 31yo. female referred to OPPT for R shoulder/arm/ and periscapular pain. PMH includes: Anemia, anxiety, GERD, lumbar radiculopathy, Polycystic ovarian syndrome, B plantar fasciitis. Pt reports onset of ~3 weeks ago with N/T down into RUE and hand. 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 MOI reported as an upward pull on R shoulder in axillary region causing GHJ elevation. Does reports symptoms as burning, N/T, throbbing at times in shoulder/periscapular region. Demonstrates tightness with cervial mobility but no reproduction of symptoms down RLE. typically hovers at 4/10 NPS, 7/10 NPS is worst. Currently a 6/10 NPS. Reports pain along posterior, lateral RUE onto dorsal R hand along4th and 5th finger. Reports symptoms improved with heat, prednisone. Pain is worse with shoulder mobility with getting dressed, driving, with RUE in dependent position worsens symptoms. Pt's goal is to improve shoulder pain, N/T symptoms.   PRECAUTIONS: None   SUBJECTIVE: Pt reports continued relief of pain in right shoulder with some pain in right shoulder blade after swimming over weekend. Otherwise, she continues to note improvement with right shoulder mobility and pain response with mobility.   PAIN:  Are you having pain? No Pain location: right shoulder, anterior  Pain description: burning and achy Aggravating factors: Reaching overhead and when adducting arm   Relieving factors: Keeping arm by her side  TODAY'S TREATMENT:  06/13/21  THEREX:  UBE Seat 12 and resistance 2, 5 min  Shoulder Horizontal Abduction with YTB 3 x 10   MANUAL: Trigger point release of right rhomboid    05/22/21   THEREX   UBE resistance 3- 5 min   Shoulder Flexion with #1 DB  -Reaches full 180 deg flexion with slight pain coming down into extension at 90 deg    Shoulder Abduction #1 DB 3 x 10  -Able to achieve 180 degrees of abduction   MANUAL   Myofascial Release of right parascapular musculature -increased muscular tension in right rhomboid   04/24/21 Shoulder AAROM Flexion/Extension 3 x 30   Shoulder AAROM Abduction/ Adduction 3 x 30    Shoulder AROM  -Flexion R/L 180/180 -Abduction R/L 180/180   Shoulder Flexion #1 DB bilateral 3 x 10  Shoulder Banded T's with #1 DB bilateral 3 x 10     PATIENT EDUCATION: Education details: form and technique for appropriate exercise  Person educated: Patient Education method: Explanation, Demonstration, Verbal cues, and Handouts Education comprehension: verbalized understanding, returned demonstration, and verbal cues required   HOME EXERCISE PROGRAM:  Access Code: [email or web address] URL: [email or web address] Date: 06/13/2021 Prepared by: Ellin Goodieaniel Charmion Hapke  Exercises - Seated Shoulder Row with Anchored Resistance  - 1 x daily - 3 x weekly - 3 sets - 10 reps - Shoulder Abduction - Thumbs Up  - 1 x daily - 3 x weekly - 3 sets - 10 reps - Standing Shoulder Flexion with Resistance  - 1 x daily - 3 x weekly - 3 sets - 10 reps - Standing Shoulder Horizontal Abduction with Resistance  - 1 x daily - 3 x weekly - 3 sets - 10 reps - Doorway Rhomboid Stretch  - 1 x daily - 7 x weekly - 1 sets - 3 reps - 30 hold   PT Short Term Goals        PT SHORT TERM GOAL #1   Title Pt will be independent with HEP to improve pain, AROM, and strength to return to PLOF.    Baseline 01/16/2020: initiated    Time 4    Period Weeks    Status Achieved    Target Date 02/12/21              PT Long Term Goals       PT LONG TERM GOAL #1   Title Pt will improve FOTO score to target to demo clinically significant improvement in functional mobility.    Baseline 01/16/20: 41 with target of 60; 02/13/21: 63    Time 8    Period Weeks    Status Achieved    Target Date 03/12/21      PT LONG TERM GOAL #2    Title Pt will improve shoulder AROM to at least >160 degrees to demo ability to perform overhead ADL's.    Baseline 1/3: 91 deg elevation, 86 abduction; ~160 bilat 4/12: Shoulder Flexion R/L 180/180 Shoulder Abduction R/L 180/180   Time 8    Period Weeks    Status Achieved   Target Date 03/12/21      PT LONG TERM GOAL #3   Title Pt will improve pain to < 5/10 NPS to shoulder motion to demo improved pain with functional mobility for ADL completion.    Baseline 01/15/21: Up to 7/10 NPS with shoulder motion; 2/1: mostly 4/10 with brief spikes 3/15: 6/10 04/24/21: 5/10   Time 8    Period Weeks  Status On-going    Target Date 07/28/21             Plan     Clinical Impression Statement Pt continues to show improvement with decrease in right shoulder pain. MFR of right rhomboid with soft tissue work and then educated pt on stretching and stretching muscle to avoid future trigger points. Nearing end of POC and will reassess next session to determine progress towards goals.    Personal Factors and Comorbidities Age;Time since onset of injury/illness/exacerbation;Comorbidity 3+;Education;Fitness;Profession    Comorbidities Anemia, anxiety, GERD, lumbar radiculopathy, Polycystic ovarian syndrome, B plantar fasciitis.    Examination-Activity Limitations Reach Overhead;Carry;Dressing;Sleep;Sit;Transfers;Toileting;Stand    Examination-Participation Restrictions Community Activity;Driving;Occupation;Yard Work    Conservation officer, historic buildings Evolving/Moderate complexity    Rehab Potential Good    PT Frequency 2x / week    PT Duration 8 weeks    PT Treatment/Interventions ADLs/Self Care Home Management;Aquatic Therapy;Cryotherapy;Electrical Stimulation;Moist Heat;Traction;Gait training;Functional mobility training;Therapeutic activities;Patient/family education;Therapeutic exercise;Balance training;Neuromuscular re-education;Manual techniques;Passive range of motion;Dry needling;Spinal  Manipulations;Joint Manipulations    PT Next Visit Plan Reassess goals. Continue to progress shoulder and parascapular strengthening exercises above 90 degrees shoulder flexion and abduction. Soft tissue work    PT Home Exercise Plan Access Code: [email or web address] (update next session to incorporate more focal shoulder extension and external rotation isometric or isotonic in a tolerated range)    Consulted and Agree with Plan of Care Patient             Ellin Goodie PT, DPT  Physical Therapist- [facility]  06/13/2021, 6:08 PM

## 2021-06-17 ENCOUNTER — Ambulatory Visit: Payer: PRIVATE HEALTH INSURANCE | Admitting: Physical Therapy

## 2021-06-17 ENCOUNTER — Encounter: Payer: Self-pay | Admitting: Physical Therapy

## 2021-06-17 DIAGNOSIS — M6281 Muscle weakness (generalized): Secondary | ICD-10-CM

## 2021-06-17 DIAGNOSIS — M25511 Pain in right shoulder: Secondary | ICD-10-CM

## 2021-06-17 NOTE — Therapy (Unsigned)
OUTPATIENT PHYSICAL THERAPY TREATMENT NOTE   Patient Name: Crystal CritchleySherika M Mcalpine MRN: [id number] DOB:1990/09/21, 31 y.o., female Today's Date: 06/17/2021  PCP: Doreene Nestlark, Katherine K, NP REFERRING PROVIDER: Fredia SorrowPorterfield, Amber, PA-C   PT End of Session - 05/22/21 1807     Visit Number 19   Number of Visits 24    Date for PT Re-Evaluation 04/27/21    Authorization Type Worker's Comp: approved for 1 evaluation and 24 treatments    Authorization Time Period Cert 7/82/95-6/21/304/15/23-7/15/23    Authorization - Visit Number 19   Authorization - Number of Visits 24    Progress Note Due on Visit 20    Activity Tolerance Patient tolerated treatment well;No increased pain    Behavior During Therapy WFL for tasks assessed/performed             Past Medical History:  Diagnosis Date   Anemia    Anxiety    GERD (gastroesophageal reflux disease)    Headache    Lumbar radiculopathy    PCOS (polycystic ovarian syndrome)    Plantar fasciitis, bilateral    Past Surgical History:  Procedure Laterality Date   FOOT SURGERY Right    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   Procedure: LAPAROSCOPIC ROUX-EN-Y GASTRIC BYPASS WITH HIATAL HERNIA REPAIR AND UPPER ENDOSCOPY;  Surgeon: Luretha MurphyMartin, Matthew, MD;  Location: [facility];  Service: General;  Laterality: N/A;   WISDOM TOOTH EXTRACTION     Patient Active Problem List   Diagnosis Date Noted   MDD (major depressive disorder) 04/26/2021   Vaginal discharge 04/26/2021   Vitamin D deficiency 06/08/2020   Abnormal uterine bleeding 12/02/2019   Screening for STD (sexually transmitted disease) 10/19/2018   Encounter for birth control 03/23/2018   GERD (gastroesophageal reflux disease) 03/23/2018   Preventative health care 03/23/2018   Vitamin B12 deficiency due to intestinal malabsorption 12/16/2017   GAD (generalized anxiety disorder) 11/25/2017   S/P gastric bypass 03/17/2017    REFERRING DIAG: Right shoulder pain   THERAPY DIAG:  Acute pain of right shoulder  Muscle  weakness (generalized)  PERTINENT HISTORY: Pt is a 31yo. female referred to OPPT for R shoulder/arm/ and periscapular pain. PMH includes: Anemia, anxiety, GERD, lumbar radiculopathy, Polycystic ovarian syndrome, B plantar fasciitis. Pt reports onset of ~3 weeks ago with N/T down into RUE and hand. 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 MOI reported as an upward pull on R shoulder in axillary region causing GHJ elevation. Does reports symptoms as burning, N/T, throbbing at times in shoulder/periscapular region. Demonstrates tightness with cervial mobility but no reproduction of symptoms down RLE. typically hovers at 4/10 NPS, 7/10 NPS is worst. Currently a 6/10 NPS. Reports pain along posterior, lateral RUE onto dorsal R hand along4th and 5th finger. Reports symptoms improved with heat, prednisone. Pain is worse with shoulder mobility with getting dressed, driving, with RUE in dependent position worsens symptoms. Pt's goal is to improve shoulder pain, N/T symptoms.   PRECAUTIONS: None   SUBJECTIVE: Pt reports that only issue that she is still having is right rhomboid. She wants to work on exercises on machines in gym before trying them at gym.   PAIN:  Are you having pain? Yes, 2/10  Pain location: Right rhomboid   Pain description: burning and achy Aggravating factors: Reaching overhead and when adducting arm   Relieving factors: Keeping arm by her side      TODAY'S TREATMENT:  06/17/21 UBE 5 min seat level 13 -5 min  OMEGA Scapular Rows 3 x 10 with #15  OMEGA Lat Pulldowns 3 x 10 with #15  OMEGA Chest Press 3 x 10 with #5     06/13/21  THEREX:  UBE Seat 12 and resistance 2, 5 min  Shoulder Horizontal Abduction with YTB 3 x 10   MANUAL: Trigger point release of right rhomboid    05/22/21   THEREX   UBE resistance 3- 5 min   Shoulder Flexion with #1 DB  -Reaches full  180 deg flexion with slight pain coming down into extension at 90 deg   Shoulder Abduction #1 DB 3 x 10  -Able to achieve 180 degrees of abduction   MANUAL   Myofascial Release of right parascapular musculature -increased muscular tension in right rhomboid    PATIENT EDUCATION: Education details: form and technique for appropriate exercise  Person educated: Patient Education method: Explanation, Demonstration, Verbal cues, and Handouts Education comprehension: verbalized understanding, returned demonstration, and verbal cues required   HOME EXERCISE PROGRAM:  Access Code: [email or web address] URL: [email or web address] Date: 06/17/2021 Prepared by: Ellin Goodie  Exercises - Seated Shoulder Row with Anchored Resistance  - 1 x daily - 3 x weekly - 3 sets - 10 reps - Shoulder Abduction - Thumbs Up  - 1 x daily - 3 x weekly - 3 sets - 10 reps - Standing Shoulder Flexion with Resistance  - 1 x daily - 3 x weekly - 3 sets - 10 reps - Standing Shoulder Horizontal Abduction with Resistance  - 1 x daily - 3 x weekly - 3 sets - 10 reps - Doorway Rhomboid Stretch  - 1 x daily - 7 x weekly - 1 sets - 3 reps - 30 hold - Chest Press with Anchored Resistance  - 1 x daily - 3 x weekly - 3 sets - 10 reps   PT Short Term Goals        PT SHORT TERM GOAL #1   Title Pt will be independent with HEP to improve pain, AROM, and strength to return to PLOF.    Baseline 01/16/2020: initiated    Time 4    Period Weeks    Status Achieved    Target Date 02/12/21              PT Long Term Goals       PT LONG TERM GOAL #1   Title Pt will improve FOTO score to target to demo clinically significant improvement in functional mobility.    Baseline 01/16/20: 41 with target of 60; 02/13/21: 63    Time 8    Period Weeks    Status Achieved    Target Date 03/12/21      PT LONG TERM GOAL #2   Title Pt will improve shoulder AROM to at least >160 degrees to demo ability to perform overhead ADL's.     Baseline 1/3: 91 deg elevation, 86 abduction; ~160 bilat 4/12: Shoulder Flexion R/L 180/180 Shoulder Abduction R/L 180/180   Time 8    Period Weeks    Status Achieved   Target Date 03/12/21      PT LONG TERM GOAL #3   Title Pt will improve pain to < 5/10 NPS to shoulder motion to demo improved pain with functional mobility for ADL completion.    Baseline 01/15/21: Up to 7/10 NPS with shoulder motion; 2/1: mostly 4/10 with brief spikes 3/15: 6/10 04/24/21: 5/10   Time 8  Period Weeks    Status On-going    Target Date 07/28/21             Plan     Clinical Impression Statement Pt continues to show improvement with decrease in right shoulder pain. MFR of right rhomboid with soft tissue work and then educated pt on stretching and stretching muscle to avoid future trigger points. Nearing end of POC and will reassess next session to determine progress towards goals.    Personal Factors and Comorbidities Age;Time since onset of injury/illness/exacerbation;Comorbidity 3+;Education;Fitness;Profession    Comorbidities Anemia, anxiety, GERD, lumbar radiculopathy, Polycystic ovarian syndrome, B plantar fasciitis.    Examination-Activity Limitations Reach Overhead;Carry;Dressing;Sleep;Sit;Transfers;Toileting;Stand    Examination-Participation Restrictions Community Activity;Driving;Occupation;Yard Work    Conservation officer, historic buildings Evolving/Moderate complexity    Rehab Potential Good    PT Frequency 2x / week    PT Duration 8 weeks    PT Treatment/Interventions ADLs/Self Care Home Management;Aquatic Therapy;Cryotherapy;Electrical Stimulation;Moist Heat;Traction;Gait training;Functional mobility training;Therapeutic activities;Patient/family education;Therapeutic exercise;Balance training;Neuromuscular re-education;Manual techniques;Passive range of motion;Dry needling;Spinal Manipulations;Joint Manipulations    PT Next Visit Plan Reassess goals. Continue to progress shoulder and  parascapular strengthening exercises above 90 degrees shoulder flexion and abduction. Soft tissue work    PT Home Exercise Plan Access Code: [email or web address] (update next session to incorporate more focal shoulder extension and external rotation isometric or isotonic in a tolerated range)    Consulted and Agree with Plan of Care Patient             Ellin Goodie PT, DPT  Physical Therapist- [facility]  06/17/2021, 6:08 PM

## 2021-06-19 ENCOUNTER — Telehealth: Payer: Self-pay | Admitting: Primary Care

## 2021-06-19 ENCOUNTER — Other Ambulatory Visit: Payer: Self-pay

## 2021-06-19 DIAGNOSIS — E559 Vitamin D deficiency, unspecified: Secondary | ICD-10-CM

## 2021-06-19 DIAGNOSIS — E538 Deficiency of other specified B group vitamins: Secondary | ICD-10-CM

## 2021-06-19 NOTE — Telephone Encounter (Signed)
Pt called about a b12 shot, pt states it is on her medication list but she wants to speak with Joellen about this. Says she has not been taking it consistently, please return a call when possible, thanks.  Callback Number: [phone]

## 2021-06-19 NOTE — Telephone Encounter (Signed)
Called patient let her know we have pending refill request on both. Once reviewed by  Crystal Diaz we will call if any  labs need to be checked.

## 2021-06-20 ENCOUNTER — Other Ambulatory Visit: Payer: Self-pay

## 2021-06-20 NOTE — Telephone Encounter (Signed)
Patient needs a follow-up appointment with labs in order to continue to receive her vitamin B12 and vitamin D prescriptions.  No updated labs in 1 year.

## 2021-06-21 ENCOUNTER — Ambulatory Visit: Payer: No Typology Code available for payment source | Admitting: Psychology

## 2021-06-25 NOTE — Telephone Encounter (Signed)
Called patient she states she was seen in April she can not come back in her insurance will not pay. She is already upset because we did not bill as cpe at last visit so she had to pay. She wanted to know if she can just come in and get labs done. Patient is informed that [name] is out of the office until after 6/27 and we will reach out with instructions after that time.

## 2021-07-08 ENCOUNTER — Other Ambulatory Visit (HOSPITAL_COMMUNITY): Payer: Self-pay

## 2021-07-10 ENCOUNTER — Other Ambulatory Visit (HOSPITAL_COMMUNITY): Payer: Self-pay

## 2021-07-17 ENCOUNTER — Ambulatory Visit: Payer: No Typology Code available for payment source | Admitting: Psychology

## 2021-07-18 ENCOUNTER — Telehealth: Payer: No Typology Code available for payment source | Admitting: Nurse Practitioner

## 2021-07-18 ENCOUNTER — Other Ambulatory Visit: Payer: Self-pay

## 2021-07-18 DIAGNOSIS — W57XXXA Bitten or stung by nonvenomous insect and other nonvenomous arthropods, initial encounter: Secondary | ICD-10-CM

## 2021-07-18 DIAGNOSIS — S60869A Insect bite (nonvenomous) of unspecified wrist, initial encounter: Secondary | ICD-10-CM

## 2021-07-18 MED ORDER — PREDNISONE 20 MG PO TABS
20.0000 mg | ORAL_TABLET | Freq: Two times a day (BID) | ORAL | 0 refills | Status: AC
Start: 2021-07-18 — End: 2021-07-23
  Filled 2021-07-18: qty 10, 5d supply, fill #0

## 2021-07-18 NOTE — Progress Notes (Signed)
Virtual Visit Consent   Crystal Diaz, you are scheduled for a virtual visit with a [facility] provider today. Just as with appointments in the office, your consent must be obtained to participate. Your consent will be active for this visit and any virtual visit you may have with one of our providers in the next 365 days. If you have a MyChart account, a copy of this consent can be sent to you electronically.  As this is a virtual visit, video technology does not allow for your provider to perform a traditional examination. This may limit your provider's ability to fully assess your condition. If your provider identifies any concerns that need to be evaluated in person or the need to arrange testing (such as labs, EKG, etc.), we will make arrangements to do so. Although advances in technology are sophisticated, we cannot ensure that it will always work on either your end or our end. If the connection with a video visit is poor, the visit may have to be switched to a telephone visit. With either a video or telephone visit, we are not always able to ensure that we have a secure connection.  By engaging in this virtual visit, you consent to the provision of healthcare and authorize for your insurance to be billed (if applicable) for the services provided during this visit. Depending on your insurance coverage, you may receive a charge related to this service.  I need to obtain your verbal consent now. Are you willing to proceed with your visit today? STARNISHA BATREZ has provided verbal consent on 07/18/2021 for a virtual visit (video or telephone). Viviano Simas, FNP  Date: 07/18/2021 12:48 PM  Virtual Visit via Video Note   I, Viviano Simas, connected with  [name]  ([id number], November 05, 1990) on 07/18/21 at 12:45 PM EDT by a video-enabled telemedicine application and verified that I am speaking with the correct person using two identifiers.  Location: Patient: Virtual Visit Location Patient:  Home Provider: Virtual Visit Location Provider: Home Office   I discussed the limitations of evaluation and management by telemedicine and the availability of in person appointments. The patient expressed understanding and agreed to proceed.    History of Present Illness: Crystal Diaz is a 31 y.o. who identifies as a female who was assigned female at birth, and is being seen today after yellow jacket bites yesterday.  She has about 4 bites on her hands, she has washed with soap and water initially.  Today she is having more itching and swelling and some pain.   Denies any history of allergic response to insect bites   Problems:  Patient Active Problem List   Diagnosis Date Noted   MDD (major depressive disorder) 04/26/2021   Vaginal discharge 04/26/2021   Vitamin D deficiency 06/08/2020   Abnormal uterine bleeding 12/02/2019   Screening for STD (sexually transmitted disease) 10/19/2018   Encounter for birth control 03/23/2018   GERD (gastroesophageal reflux disease) 03/23/2018   Preventative health care 03/23/2018   Vitamin B12 deficiency due to intestinal malabsorption 12/16/2017   GAD (generalized anxiety disorder) 11/25/2017   S/P gastric bypass 03/17/2017    Allergies: No Known Allergies Medications:  Current Outpatient Medications:    desogestrel-ethinyl estradiol (MIRCETTE) 0.15-0.02/0.01 MG (21/5) tablet, Take 1 tablet by mouth daily., Disp: 84 tablet, Rfl: 2   pantoprazole (PROTONIX) 40 MG tablet, Take 1 tablet (40 mg total) by mouth daily., Disp: 90 tablet, Rfl: 3   pantoprazole (PROTONIX) 40 MG tablet, Take  1 tablet by mouth daily., Disp: 30 tablet, Rfl: 11   SYRINGE/NEEDLE, DISP, 1 ML 23G X 1" 1 ML MISC, Use once monthly with vitamin B12 vial., Disp: 12 each, Rfl: 0  Observations/Objective: Patient is well-developed, well-nourished in no acute distress.  Resting comfortably  at home.  Head is normocephalic, atraumatic.  No labored breathing.  Speech is clear and  coherent with logical content.  Patient is alert and oriented at baseline.  Bite noted to right wrist with moderate swelling to joint without noted erythema   Assessment and Plan: 1. Insect bite (nonvenomous) of wrist  - predniSONE (DELTASONE) 20 MG tablet; Take 1 tablet (20 mg total) by mouth 2 (two) times daily with a meal for 5 days.  Dispense: 10 tablet; Refill: 0    Claritin today, benadryl before bed Monitor for signs of infection Keep clean with soap and water  Topical antibiotic ointment OTC on bite as well   Follow Up Instructions: I discussed the assessment and treatment plan with the patient. The patient was provided an opportunity to ask questions and all were answered. The patient agreed with the plan and demonstrated an understanding of the instructions.  A copy of instructions were sent to the patient via MyChart unless otherwise noted below.    The patient was advised to call back or seek an in-person evaluation if the symptoms worsen or if the condition fails to improve as anticipated.  Time:  I spent 10 minutes with the patient via telehealth technology discussing the above problems/concerns.    Viviano Simas, FNP

## 2021-07-19 NOTE — Telephone Encounter (Signed)
Left message to return call to our office.  

## 2021-07-19 NOTE — Telephone Encounter (Signed)
Patient is calling in about the message below. Asked if there was an update.

## 2021-07-19 NOTE — Telephone Encounter (Signed)
Please kindly notify patient:  Her last visit with me was on 04/26/2021 for an acute visit for symptoms of anxiety and depression.  She was not due for CPE, nor did she express interest in a CPE.  Last CPE was 06/08/2020.  I am happy to see her now for CPE (as she is now due) and follow-up of labs as requested.

## 2021-07-19 NOTE — Telephone Encounter (Signed)
Patient called office and was able to get cpe set up. I have given her the number to billing for questions about last visit bill. No further action needed at this time.

## 2021-08-01 ENCOUNTER — Other Ambulatory Visit: Payer: Self-pay

## 2021-08-01 ENCOUNTER — Ambulatory Visit (HOSPITAL_BASED_OUTPATIENT_CLINIC_OR_DEPARTMENT_OTHER): Payer: No Typology Code available for payment source | Admitting: Psychiatry

## 2021-08-01 ENCOUNTER — Encounter (HOSPITAL_COMMUNITY): Payer: Self-pay | Admitting: Psychiatry

## 2021-08-01 VITALS — Wt 227.0 lb

## 2021-08-01 DIAGNOSIS — F419 Anxiety disorder, unspecified: Secondary | ICD-10-CM | POA: Diagnosis not present

## 2021-08-01 DIAGNOSIS — F331 Major depressive disorder, recurrent, moderate: Secondary | ICD-10-CM

## 2021-08-01 MED ORDER — LAMOTRIGINE 25 MG PO TABS
ORAL_TABLET | ORAL | 0 refills | Status: DC
  Filled 2021-08-01: qty 60, 30d supply, fill #0

## 2021-08-01 NOTE — Progress Notes (Addendum)
[facility] Initial Assessment Note  Patient Location: Work Provider Location: Home Office   I connected with Crystal Diaz by video and verified that I am talking with correct person using two identifiers.   I discussed the limitations, risks, security and privacy concerns of performing an evaluation and management service virtually and the availability of in person appointments. I also discussed with the patient that there may be a patient responsible charge related to this service. The patient expressed understanding and agreed to proceed.  Crystal Diaz [id number] 31 y.o.  08/01/2021 9:05 AM  Chief Complaint:  I was referred by PCP.  History of Present Illness:  Patient is 30 year old African-American, employed female who was referred from her primary care physician for med mental psychiatric symptoms.  Patient struggled with anxiety, depression, mood swings for a while.  Patient reported lately she has lots of anxiety and panic attacks, irritability, frustration and getting easily upset.  She is not able to enjoy her life.  She gets easily angry and upset with the people.  She also reported a lot of ruminative and negative thoughts.  She reported lately excessive spending because and that she does not care.  She described sometime passive and fleeting thoughts do not live but denies any active plan or intent to do it.  She admitted having a lot of hopelessness, worthlessness and crying spells.  She endorsed a lot of anger and feels that people intentionally trying to hurt her.  She endorsed having trust issues and paranoid.  She used to go to gym but lately does not feel comfortable that people are looking at her.  She also reported an incident a few weeks ago at nail salon when she got very upset after she felt that she did not get right care for her nails.  Patient told they pulled the camera and after watching that they were right she felt very embarrassed and ashamed.   She is not sleeping as good.  She endorsed there are days when she feels very happy and with full of energy and then she gets very sad, withdrawn and does not do much things.  She admitted which has spent more time with the 33 year old.  Patient reported feeling lonely, sad and missed her mother who [place] 11-[place]-2018 due to COVID.  She was very close to her mother.  Her father also died in 05-11-2011 due to stage IV pancreatic cancer.  Recently patient younger brother was arrested and currently in prison for second degree murder charge.  Patient reported brother has schizophrenia and does not take the medication.  Patient denies any hallucination, homicidal thoughts and violence but admitted her mood is all over and she does not feel comfortable around people.  Her appetite is okay.  She had gastric bypass in 05/10/17 and her weight is a stable.  However she admitted sometimes she does not take vitamins which she supposed to get it.  Patient denies PTSD, obsessive thoughts and hallucinations.  She had tried venlafaxine, Lexapro and Zoloft but they were ineffective.  Currently she is seeing a therapist Maggie Font. She admitted to occasional drinking when she is on vacation but denies daily drinking.  Patient lives with her 33 year old and recently proposed by her fianc on July 4.  She reported does not have enough support system other than grandmother who lives in [place].  Past Psychiatric History: Denies any history of suicidal attempt, inpatient treatment, psychosis, hallucination, PTSD.  Tried Zoloft, Effexor and  Lexapro from PCP but they were ineffective.  Family History  Problem Relation Age of Onset   Diabetes Mother    Pancreatic cancer Father 64   Cancer Brother        Oral   Mental illness Brother       Past Medical History:  Diagnosis Date   Anemia    Anxiety    GERD (gastroesophageal reflux disease)    Headache    Lumbar radiculopathy    PCOS (polycystic ovarian syndrome)    Plantar  fasciitis, bilateral      Traumatic Head Injury: Denies head injury  Work History; Working as Engineer, manufacturing at [employer].   Psychosocial History; Patient lives with her 19 year old son.  Her fianc recently proposed her on July 4.  Patient reported her son's father is not in their life.  Legal History; Denies any current legal issues.  History Of Abuse; Denies any h/o abuse.  Substance Abuse History; Occasional drink when on Vacations. No Binge or intoxication.   Neurologic: Headache: Yes Seizure: No Paresthesias: No   Outpatient Encounter Medications as of 08/01/2021  Medication Sig   desogestrel-ethinyl estradiol (MIRCETTE) 0.15-0.02/0.01 MG (21/5) tablet Take 1 tablet by mouth daily.   pantoprazole (PROTONIX) 40 MG tablet Take 1 tablet (40 mg total) by mouth daily.   pantoprazole (PROTONIX) 40 MG tablet Take 1 tablet by mouth daily.   SYRINGE/NEEDLE, DISP, 1 ML 23G X 1" 1 ML MISC Use once monthly with vitamin B12 vial.   No facility-administered encounter medications on file as of 08/01/2021.    No results found for this or any previous visit (from the past 2160 hour(s)).    Constitutional:  Wt 227 lb (103 kg)   BMI 33.52 kg/m    Musculoskeletal: Strength & Muscle Tone: within normal limits Gait & Station: normal Patient leans: N/A  Psychiatric Specialty Exam: Physical Exam  ROS  Weight 227 lb (103 kg).There is no height or weight on file to calculate BMI.  General Appearance: Casual  Eye Contact:  Good  Speech:  Normal Rate  Volume:  Normal  Mood:  Anxious, Depressed, Dysphoric, and Hopeless  Affect:  Constricted and Depressed  Thought Process:  Goal Directed  Orientation:  Full (Time, Place, and Person)  Thought Content:  Paranoid Ideation and Rumination  Suicidal Thoughts:  No  Homicidal Thoughts:  No  Memory:  Immediate;   Good Recent;   Good Remote;   Good  Judgement:  Fair  Insight:  Shallow  Psychomotor Activity:  Decreased   Concentration:  Concentration: Fair and Attention Span: Fair  Recall:  Good  Fund of Knowledge:  Good  Language:  Good  Akathisia:  No  Handed:  Right  AIMS (if indicated):     Assets:  Communication Skills Desire for Improvement Housing Talents/Skills Transportation  ADL's:  Intact  Cognition:  WNL  Sleep:   frequent awakening     Assessment/Plan:  Patient is a 31 year old African-American, employed female who was seen for the symptoms of irritability, anger, mood swing, depression, anxiety, negative and fleeting thoughts.  She had tried Zoloft, Effexor and Lexapro without any improvement.  I reviewed current medication, collateral medication, blood work results, psychosocial stressors.  We discussed underlying mood disorder.  Given the fact that she had tried 3 antidepressants with lack of improvement we talked about trying a mood stabilizer and patient agreed with the plan.  We will start lamotrigine 25 mg for 1 week and then 50 mg daily to help  with the mood symptoms.  I also recommend trying melatonin to help her insomnia.  I also encourage continued therapy with Maggie Font for counseling.  I discussed medication side effects that anytime if she had any rash, itching then she need to call us back immediately.  I have provided nurse line in case she has any questions or concerns or issues related to the medication.  We will follow up in 3 to 4 months.   Cleotis Nipper, MD 08/01/2021    Follow Up Instructions: I discussed the assessment and treatment plan with the patient. The patient was provided an opportunity to ask questions and all were answered. The patient agreed with the plan and demonstrated an understanding of the instructions.   The patient was advised to call back or seek an in-person evaluation if the symptoms worsen or if the condition fails to improve as anticipated.   Collaboration of Care: Primary Care Provider AEB notes are available in epic to review.    Patient/Guardian was advised Release of Information must be obtained prior to any record release in order to collaborate their care with an outside provider. Patient/Guardian was advised if they have not already done so to contact the registration department to sign all necessary forms in order for [place] to release information regarding their care.    Consent: Patient/Guardian gives verbal consent for treatment and assignment of benefits for services provided during this visit. Patient/Guardian expressed understanding and agreed to proceed.     I provided 61 minutes of non-face-to-face time during this encounter.

## 2021-08-02 ENCOUNTER — Ambulatory Visit (INDEPENDENT_AMBULATORY_CARE_PROVIDER_SITE_OTHER): Payer: No Typology Code available for payment source | Admitting: Nurse Practitioner

## 2021-08-02 ENCOUNTER — Other Ambulatory Visit: Payer: Self-pay

## 2021-08-02 VITALS — BP 100/78 | HR 101 | Temp 97.8°F | Resp 12 | Ht 69.0 in | Wt 232.2 lb

## 2021-08-02 DIAGNOSIS — M25511 Pain in right shoulder: Secondary | ICD-10-CM | POA: Diagnosis not present

## 2021-08-02 DIAGNOSIS — Z79899 Other long term (current) drug therapy: Secondary | ICD-10-CM

## 2021-08-02 DIAGNOSIS — M791 Myalgia, unspecified site: Secondary | ICD-10-CM | POA: Diagnosis not present

## 2021-08-02 LAB — POCT URINE PREGNANCY: Preg Test, Ur: NEGATIVE

## 2021-08-02 MED ORDER — KETOROLAC TROMETHAMINE 60 MG/2ML IM SOLN
60.0000 mg | Freq: Once | INTRAMUSCULAR | Status: AC
  Administered 2021-08-02: 60 mg via INTRAMUSCULAR

## 2021-08-02 MED ORDER — CYCLOBENZAPRINE HCL 5 MG PO TABS
5.0000 mg | ORAL_TABLET | Freq: Two times a day (BID) | ORAL | 0 refills | Status: DC | PRN
  Filled 2021-08-02: qty 20, 10d supply, fill #0

## 2021-08-02 NOTE — Assessment & Plan Note (Signed)
Crystal Diaz.  No recent injury to exacerbate.  Will administer Toradol 60 mg IM x1 dose.  Patient to avoid NSAIDs for 12+ hours patient had gastric bypass and should avoid oral NSAIDs altogether.  We will send in Flexeril 5 mg twice daily as needed.  Did review sedation precautions with patient

## 2021-08-02 NOTE — Patient Instructions (Signed)
Nice to see you today We gave you Toradol  in office Sent in a muscle relaxer. This can be sedating so use caution

## 2021-08-02 NOTE — Assessment & Plan Note (Signed)
Given muscle tightness and tenderness on exam will prescribe Flexeril 5 mg twice daily as needed.  Sedation precautions reviewed with patient

## 2021-08-02 NOTE — Progress Notes (Signed)
Acute Office Visit  Subjective:     Patient ID: Crystal Diaz, female    DOB: 1990/12/13, 31 y.o.   MRN: [id number]  Chief Complaint  Patient presents with   Shoulder Pain    Had injury in December 2022. Has had increased right shoulder ache and burning sensation the past 1 week. No re injury. Has been using ice and Biofreeze. Had a small tear and pocket of fluid per MRI that was done in February. Has had PT done.       Patient is in today for Right shoulder pain   Stats that she had a injury in December 2022.  She has had an MRI and completed physical therapy.  States that symptoms started approximately 1 week ago patient denies any recent or recurrent injury.  States that she has a aching and burning sensation. States that she has completed sickle therapy approx 3 months ago but does try to do her home exercises.  For further discussion the only asked that we could come up with is that she was stung by bees a couple weeks ago and states that she was flailing her arms to try to get away from the bees and get the bees away from her.  She was prescribed steroids that did not affect the shoulder discomfort    Review of Systems  Constitutional:  Negative for chills and fever.  Musculoskeletal:  Positive for joint pain.  Neurological:  Negative for tingling and weakness.        Objective:    BP 100/78   Pulse (!) 101   Temp 97.8 F (36.6 C)   Resp 12   Ht 5\' 9"  (1.753 m)   Wt 232 lb 4 oz (105.3 kg)   LMP 06/19/2021   SpO2 98%   BMI 34.30 kg/m    Physical Exam Vitals and nursing note reviewed.  Constitutional:      Appearance: Normal appearance.  Cardiovascular:     Rate and Rhythm: Normal rate and regular rhythm.     Pulses:          Radial pulses are 2+ on the right side and 2+ on the left side.     Heart sounds: Normal heart sounds.  Pulmonary:     Effort: Pulmonary effort is normal.     Breath sounds: Normal breath sounds.  Musculoskeletal:        Arms:     Comments: "+" 08/19/2021 test. Patient has good active range of motion  Skin:    General: Skin is warm.  Neurological:     Mental Status: She is alert.     Deep Tendon Reflexes:     Reflex Scores:      Bicep reflexes are 2+ on the right side and 2+ on the left side.    Comments: Bilateral upper extremity strength 5/5     Results for orders placed or performed in visit on 08/02/21  POCT urine pregnancy  Result Value Ref Range   Preg Test, Ur Negative Negative        Assessment & Plan:   Problem List Items Addressed This Visit       Other   Acute pain of right shoulder - Primary    [place].  No recent injury to exacerbate.  Will administer Toradol 60 mg IM x1 dose.  Patient to avoid NSAIDs for 12+ hours patient had gastric bypass and should avoid oral NSAIDs altogether.  We will send in Flexeril 5 mg  twice daily as needed.  Did review sedation precautions with patient      Relevant Orders   POCT urine pregnancy (Completed)   Muscle tenderness    Given muscle tightness and tenderness on exam will prescribe Flexeril 5 mg twice daily as needed.  Sedation precautions reviewed with patient      Relevant Medications   cyclobenzaprine (FLEXERIL) 5 MG tablet   High risk medication use    Childbearing age urine pregnancy test was negative okay to administer Toradol IM in office      Relevant Orders   POCT urine pregnancy (Completed)    Meds ordered this encounter  Medications   ketorolac (TORADOL) injection 60 mg   cyclobenzaprine (FLEXERIL) 5 MG tablet    Sig: Take 1 tablet (5 mg total) by mouth 2 (two) times daily as needed for muscle spasms.    Dispense:  20 tablet    Refill:  0    Order Specific Question:   Supervising Provider    Answer:   TOWER, MARNE A [[id number]    Return if symptoms worsen or fail to improve.  Audria Nine, NP

## 2021-08-02 NOTE — Assessment & Plan Note (Signed)
Childbearing age urine pregnancy test was negative okay to administer Toradol IM in office

## 2021-08-06 ENCOUNTER — Encounter: Payer: Self-pay | Admitting: Obstetrics and Gynecology

## 2021-08-06 ENCOUNTER — Ambulatory Visit (INDEPENDENT_AMBULATORY_CARE_PROVIDER_SITE_OTHER): Payer: No Typology Code available for payment source | Admitting: Obstetrics and Gynecology

## 2021-08-06 ENCOUNTER — Other Ambulatory Visit: Payer: Self-pay

## 2021-08-06 ENCOUNTER — Other Ambulatory Visit (HOSPITAL_COMMUNITY)
Admission: RE | Admit: 2021-08-06 | Discharge: 2021-08-06 | Disposition: A | Discharge status: Home / Self Care | Payer: No Typology Code available for payment source | Source: Ambulatory Visit | Attending: Obstetrics and Gynecology | Admitting: Obstetrics and Gynecology

## 2021-08-06 VITALS — BP 110/72 | HR 76 | Ht 69.0 in | Wt 232.7 lb

## 2021-08-06 DIAGNOSIS — Z124 Encounter for screening for malignant neoplasm of cervix: Secondary | ICD-10-CM | POA: Diagnosis present

## 2021-08-06 DIAGNOSIS — N898 Other specified noninflammatory disorders of vagina: Secondary | ICD-10-CM | POA: Insufficient documentation

## 2021-08-06 DIAGNOSIS — Z0000 Encounter for general adult medical examination without abnormal findings: Secondary | ICD-10-CM | POA: Insufficient documentation

## 2021-08-06 DIAGNOSIS — Z3009 Encounter for other general counseling and advice on contraception: Secondary | ICD-10-CM

## 2021-08-06 DIAGNOSIS — Z01419 Encounter for gynecological examination (general) (routine) without abnormal findings: Secondary | ICD-10-CM

## 2021-08-06 MED ORDER — DESOGESTREL-ETHINYL ESTRADIOL 0.15-0.02/0.01 MG (21/5) PO TABS
1.0000 | ORAL_TABLET | Freq: Every day | ORAL | 2 refills | Status: DC
  Filled 2021-08-06 – 2021-09-30 (×2): qty 84, 84d supply, fill #0
  Filled 2021-11-27 – 2021-12-27 (×2): qty 84, 84d supply, fill #1
  Filled 2022-03-21: qty 84, 84d supply, fill #2

## 2021-08-06 NOTE — Progress Notes (Addendum)
HPI:      [name] is a 31 y.o. G1P1001 who LMP was Patient's last menstrual period was 07/07/2021 (approximate).  Subjective:   She presents today for her annual examination.  She has had a vaginal discharge for several weeks.  She reports that she previously was tested but is not sure that that was correct.  She would like to be tested for STDs as well as general vaginitis. She is taking OCPs as directed usually having normal regular cycles.  Would like to continue OCPs.  She is considering pregnancy in approximately 1 year. She recently became engaged in is planning to be married next July.!!    Hx: The following portions of the patient's history were reviewed and updated as appropriate:             She  has a past medical history of Anemia, Anxiety, GERD (gastroesophageal reflux disease), Headache, Lumbar radiculopathy, PCOS (polycystic ovarian syndrome), and Plantar fasciitis, bilateral. She does not have any pertinent problems on file. 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 Her family history includes Cancer in her brother; Diabetes in her mother; Mental illness in her brother; Pancreatic cancer (age of onset: 45) in her father. She  reports that she quit smoking about 4 years ago. Her smoking use included cigarettes. She has a 1.00 pack-year smoking history. She has never used smokeless tobacco. She reports current alcohol use. She reports that she does not use drugs. She has a current medication list which includes the following prescription(s): cyclobenzaprine, lamotrigine, pantoprazole, and desogestrel-ethinyl estradiol. She has No Known Allergies.       Review of Systems:  Review of Systems  Constitutional: Denied constitutional symptoms, night sweats, recent illness, fatigue, fever, insomnia and weight loss.  Eyes: Denied eye symptoms, eye pain, photophobia, vision change and  visual disturbance.  Ears/Nose/Throat/Neck: Denied ear, nose, throat or neck symptoms, hearing loss, nasal discharge, sinus congestion and sore throat.  Cardiovascular: Denied cardiovascular symptoms, arrhythmia, chest pain/pressure, edema, exercise intolerance, orthopnea and palpitations.  Respiratory: Denied pulmonary symptoms, asthma, pleuritic pain, productive sputum, cough, dyspnea and wheezing.  Gastrointestinal: Denied, gastro-esophageal reflux, melena, nausea and vomiting.  Genitourinary: See HPI for additional information.  Musculoskeletal: Denied musculoskeletal symptoms, stiffness, swelling, muscle weakness and myalgia.  Dermatologic: Denied dermatology symptoms, rash and scar.  Neurologic: Denied neurology symptoms, dizziness, headache, neck pain and syncope.  Psychiatric: Denied psychiatric symptoms, anxiety and depression.  Endocrine: Denied endocrine symptoms including hot flashes and night sweats.   Meds:   Current Outpatient Medications on File Prior to Visit  Medication Sig Dispense Refill   cyclobenzaprine (FLEXERIL) 5 MG tablet Take 1 tablet (5 mg total) by mouth 2 (two) times daily as needed for muscle spasms. 20 tablet 0   lamoTRIgine (LAMICTAL) 25 MG tablet Take one tab daily for one week and than two daily 60 tablet 0   pantoprazole (PROTONIX) 40 MG tablet Take 1 tablet (40 mg total) by mouth daily. 90 tablet 3   No current facility-administered medications on file prior to visit.     Objective:     Vitals:   08/06/21 0811  BP: 110/72  Pulse: 76    Filed Weights   08/06/21 0811  Weight: 232 lb 11.2 oz (105.6 kg)            Physical examination General NAD, Conversant  HEENT Atraumatic; Op clear with mmm.  Normo-cephalic. Pupils reactive.  Anicteric sclerae  Thyroid/Neck Smooth without nodularity or enlargement. Normal ROM.  Neck Supple.  Skin No rashes, lesions or ulceration. Normal palpated skin turgor. No nodularity.  Breasts: No masses or discharge.   Symmetric.  No axillary adenopathy.  Lungs: Clear to auscultation.No rales or wheezes. Normal Respiratory effort, no retractions.  Heart: NSR.  No murmurs or rubs appreciated. No periferal edema  Abdomen: Soft.  Non-tender.  No masses.  No HSM. No hernia  Extremities: Moves all appropriately.  Normal ROM for age. No lymphadenopathy.  Neuro: Oriented to PPT.  Normal mood. Normal affect.     Pelvic:   Vulva: Normal appearance.  No lesions.  Vagina: No lesions or abnormalities noted.  Support: Normal pelvic support.  Urethra No masses tenderness or scarring.  Meatus Normal size without lesions or prolapse.  Cervix: Normal appearance.  No lesions.  Anus: Normal exam.  No lesions.  Perineum: Normal exam.  No lesions.        Bimanual   Uterus: Normal size.  Non-tender.  Mobile.  AV.  Adnexae: No masses.  Non-tender to palpation.  Cul-de-sac: Negative for abnormality.     Assessment:    G1P1001 Patient Active Problem List   Diagnosis Date Noted   Acute pain of right shoulder 08/02/2021   Muscle tenderness 08/02/2021   High risk medication use 08/02/2021   MDD (major depressive disorder) 04/26/2021   Vaginal discharge 04/26/2021   Vitamin D deficiency 06/08/2020   Abnormal uterine bleeding 12/02/2019   Screening for STD (sexually transmitted disease) 10/19/2018   Encounter for birth control 03/23/2018   GERD (gastroesophageal reflux disease) 03/23/2018   Preventative health care 03/23/2018   Vitamin B12 deficiency due to intestinal malabsorption 12/16/2017   GAD (generalized anxiety disorder) 11/25/2017   S/P gastric bypass 03/17/2017     1. Well woman exam with routine gynecological exam   2. Cervical cancer screening   3. Vaginal odor   4. Vaginal discharge   5. Birth control counseling        Plan:            1.  Basic Screening Recommendations The basic screening recommendations for asymptomatic women were discussed with the patient during her visit.  The  age-appropriate recommendations were discussed with her and the rational for the tests reviewed.  When I am informed by the patient that another primary care physician has previously obtained the age-appropriate tests and they are up-to-date, only outstanding tests are ordered and referrals given as necessary.  Abnormal results of tests will be discussed with her when all of her results are completed.  Routine preventative health maintenance measures emphasized: Exercise/Diet/Weight control, Tobacco Warnings, Alcohol/Substance use risks and Stress Management Pap performed  2.  Continue OCPs  3.  Nuswab performed  4.  Briefly discussed use of prenatal vitamins and discontinuation of OCPs when patient begins to attempt pregnancy. Orders No orders of the defined types were placed in this encounter.    Meds ordered this encounter  Medications   desogestrel-ethinyl estradiol (MIRCETTE) 0.15-0.02/0.01 MG (21/5) tablet    Sig: Take 1 tablet by mouth daily.    Dispense:  84 tablet    Refill:  2          F/U  Return in about 1 year (around 08/07/2022) for We will contact her with any abnormal test results, Annual Physical.  Elonda Husky, M.D. 08/06/2021 8:55 AM

## 2021-08-06 NOTE — Progress Notes (Signed)
Patients presents for annual exam today. She states having vaginal discharge and odor for the last month. Patient states she would like to discuss possible pregnancy for the upcoming year. Patient is due for pap smear, ordered. Patient states no other questions or concerns at this time.

## 2021-08-07 LAB — CERVICOVAGINAL ANCILLARY ONLY
Bacterial Vaginitis (gardnerella): NEGATIVE
Candida Glabrata: NEGATIVE
Candida Vaginitis: NEGATIVE
Chlamydia: NEGATIVE
Comment: NEGATIVE
Comment: NEGATIVE
Comment: NEGATIVE
Comment: NEGATIVE
Comment: NEGATIVE
Comment: NORMAL
Neisseria Gonorrhea: NEGATIVE
Trichomonas: NEGATIVE

## 2021-08-07 NOTE — Addendum Note (Signed)
Addended by: [name] on: 08/07/2021 07:54 AM   Modules accepted: Orders

## 2021-08-08 ENCOUNTER — Encounter: Payer: No Typology Code available for payment source | Admitting: Primary Care

## 2021-08-09 ENCOUNTER — Ambulatory Visit: Payer: No Typology Code available for payment source | Admitting: Psychology

## 2021-08-13 LAB — CYTOLOGY - PAP
Comment: NEGATIVE
Diagnosis: NEGATIVE
Diagnosis: REACTIVE
High risk HPV: NEGATIVE

## 2021-08-16 ENCOUNTER — Other Ambulatory Visit: Payer: No Typology Code available for payment source

## 2021-08-29 ENCOUNTER — Telehealth: Payer: Self-pay | Admitting: Primary Care

## 2021-08-29 ENCOUNTER — Other Ambulatory Visit (HOSPITAL_COMMUNITY): Payer: Self-pay | Admitting: Psychiatry

## 2021-08-29 DIAGNOSIS — F419 Anxiety disorder, unspecified: Secondary | ICD-10-CM

## 2021-08-29 DIAGNOSIS — F331 Major depressive disorder, recurrent, moderate: Secondary | ICD-10-CM

## 2021-08-29 NOTE — Telephone Encounter (Signed)
Office visit required for refills.  Please schedule.

## 2021-08-29 NOTE — Telephone Encounter (Signed)
  Encourage patient to contact the pharmacy for refills or they can request refills through [facility]  Did the patient contact the pharmacy:  yes   LAST APPOINTMENT DATE:  Please schedule appointment if longer than 1 year  NEXT APPOINTMENT DATE:  MEDICATION:pantoprazole (PROTONIX) 40 MG tablet,   Is the patient out of medication? yes  If not, how much is left?  Is this a 90 day supply: yes  PHARMACY: [facility] Phone:  [phone]  Fax:  [phone]      Let patient know to contact pharmacy at the end of the day to make sure medication is ready.  Please notify patient to allow 48-72 hours to process  CLINICAL FILLS OUT ALL BELOW:   Patient also stated that she would like a refill on B-12 monthly injections. Call back number [phone]. And it goes to  [facility] Phone:  [phone]  Fax:  [phone]     Patient also wanted to know if she can get her labs done here because there is an order at he OBG[facility].

## 2021-08-30 ENCOUNTER — Other Ambulatory Visit: Payer: Self-pay

## 2021-08-30 ENCOUNTER — Other Ambulatory Visit (HOSPITAL_COMMUNITY): Payer: Self-pay

## 2021-08-30 MED ORDER — PANTOPRAZOLE SODIUM 40 MG PO TBEC
DELAYED_RELEASE_TABLET | ORAL | 0 refills | Status: DC
  Filled 2021-08-30: qty 30, 30d supply, fill #0

## 2021-08-30 NOTE — Telephone Encounter (Signed)
Patient scheduled.

## 2021-09-04 ENCOUNTER — Encounter: Payer: No Typology Code available for payment source | Admitting: Obstetrics and Gynecology

## 2021-09-06 ENCOUNTER — Ambulatory Visit (INDEPENDENT_AMBULATORY_CARE_PROVIDER_SITE_OTHER): Payer: No Typology Code available for payment source | Admitting: Primary Care

## 2021-09-06 ENCOUNTER — Encounter (HOSPITAL_COMMUNITY): Payer: Self-pay | Admitting: Psychiatry

## 2021-09-06 ENCOUNTER — Telehealth (HOSPITAL_BASED_OUTPATIENT_CLINIC_OR_DEPARTMENT_OTHER): Payer: No Typology Code available for payment source | Admitting: Psychiatry

## 2021-09-06 ENCOUNTER — Encounter: Payer: Self-pay | Admitting: Primary Care

## 2021-09-06 ENCOUNTER — Other Ambulatory Visit: Payer: Self-pay

## 2021-09-06 VITALS — Wt 232.0 lb

## 2021-09-06 VITALS — BP 130/70 | HR 91 | Temp 98.3°F | Resp 16 | Ht 69.0 in | Wt 232.1 lb

## 2021-09-06 DIAGNOSIS — R519 Headache, unspecified: Secondary | ICD-10-CM

## 2021-09-06 DIAGNOSIS — K909 Intestinal malabsorption, unspecified: Secondary | ICD-10-CM

## 2021-09-06 DIAGNOSIS — F411 Generalized anxiety disorder: Secondary | ICD-10-CM | POA: Diagnosis not present

## 2021-09-06 DIAGNOSIS — F331 Major depressive disorder, recurrent, moderate: Secondary | ICD-10-CM | POA: Diagnosis not present

## 2021-09-06 DIAGNOSIS — F419 Anxiety disorder, unspecified: Secondary | ICD-10-CM

## 2021-09-06 DIAGNOSIS — K219 Gastro-esophageal reflux disease without esophagitis: Secondary | ICD-10-CM

## 2021-09-06 DIAGNOSIS — N939 Abnormal uterine and vaginal bleeding, unspecified: Secondary | ICD-10-CM

## 2021-09-06 DIAGNOSIS — E559 Vitamin D deficiency, unspecified: Secondary | ICD-10-CM

## 2021-09-06 DIAGNOSIS — Z9884 Bariatric surgery status: Secondary | ICD-10-CM | POA: Diagnosis not present

## 2021-09-06 DIAGNOSIS — Z0001 Encounter for general adult medical examination with abnormal findings: Secondary | ICD-10-CM | POA: Diagnosis not present

## 2021-09-06 DIAGNOSIS — E538 Deficiency of other specified B group vitamins: Secondary | ICD-10-CM

## 2021-09-06 LAB — COMPREHENSIVE METABOLIC PANEL
ALT: 9 U/L (ref 0–35)
AST: 13 U/L (ref 0–37)
Albumin: 3.9 g/dL (ref 3.5–5.2)
Alkaline Phosphatase: 41 U/L (ref 39–117)
BUN: 11 mg/dL (ref 6–23)
CO2: 28 mEq/L (ref 19–32)
Calcium: 9.2 mg/dL (ref 8.4–10.5)
Chloride: 104 mEq/L (ref 96–112)
Creatinine, Ser: 0.81 mg/dL (ref 0.40–1.20)
GFR: 95.28 mL/min (ref >60.00)
Glucose, Bld: 82 mg/dL (ref 70–99)
Potassium: 4.9 mEq/L (ref 3.5–5.1)
Sodium: 136 mEq/L (ref 135–145)
Total Bilirubin: 0.3 mg/dL (ref 0.2–1.2)
Total Protein: 7 g/dL (ref 6.0–8.3)

## 2021-09-06 LAB — CBC
HCT: 37.1 % (ref 36.0–46.0)
Hemoglobin: 12.2 g/dL (ref 12.0–15.0)
MCHC: 33 g/dL (ref 30.0–36.0)
MCV: 87.1 fl (ref 78.0–100.0)
Platelets: 285 10*3/uL (ref 150.0–400.0)
RBC: 4.25 Mil/uL (ref 3.87–5.11)
RDW: 15.7 % — ABNORMAL HIGH (ref 11.5–15.5)
WBC: 8.6 10*3/uL (ref 4.0–10.5)

## 2021-09-06 LAB — LIPID PANEL
Cholesterol: 129 mg/dL (ref 0–200)
HDL: 54.4 mg/dL (ref >39.00)
LDL Cholesterol: 62 mg/dL (ref 0–99)
NonHDL: 74.77
Total CHOL/HDL Ratio: 2
Triglycerides: 65 mg/dL (ref 0.0–149.0)
VLDL: 13 mg/dL (ref 0.0–40.0)

## 2021-09-06 LAB — TSH: TSH: 1.08 u[IU]/mL (ref 0.35–5.50)

## 2021-09-06 LAB — VITAMIN D 25 HYDROXY (VIT D DEFICIENCY, FRACTURES): VITD: 17.95 ng/mL — ABNORMAL LOW (ref 30.00–100.00)

## 2021-09-06 LAB — VITAMIN B12: Vitamin B-12: 150 pg/mL — ABNORMAL LOW (ref 211–911)

## 2021-09-06 MED ORDER — PROPRANOLOL HCL ER 80 MG PO CP24
80.0000 mg | ORAL_CAPSULE | Freq: Every day | ORAL | 0 refills | Status: DC
  Filled 2021-09-06: qty 90, 90d supply, fill #0
  Filled 2021-09-09: qty 30, 30d supply, fill #0

## 2021-09-06 MED ORDER — LAMOTRIGINE 100 MG PO TABS
100.0000 mg | ORAL_TABLET | Freq: Every day | ORAL | 1 refills | Status: DC
  Filled 2021-09-06: qty 30, 30d supply, fill #0
  Filled 2021-10-15: qty 30, 30d supply, fill #1

## 2021-09-06 MED ORDER — CLONAZEPAM 0.5 MG PO TABS
0.5000 mg | ORAL_TABLET | Freq: Every day | ORAL | 0 refills | Status: DC | PRN
  Filled 2021-09-06: qty 15, 15d supply, fill #0

## 2021-09-06 MED ORDER — PANTOPRAZOLE SODIUM 20 MG PO TBEC
20.0000 mg | DELAYED_RELEASE_TABLET | Freq: Every day | ORAL | 0 refills | Status: DC
  Filled 2021-09-06: qty 30, 30d supply, fill #0

## 2021-09-06 NOTE — Progress Notes (Signed)
Virtual Visit via Video Note  I connected with Crystal Diaz on 09/06/21 at 10:20 AM EDT by a video enabled telemedicine application and verified that I am speaking with the correct person using two identifiers.  Location: Patient: Work Provider: Economist   I discussed the limitations of evaluation and management by telemedicine and the availability of in person appointments. The patient expressed understanding and agreed to proceed.  History of Present Illness: Patient is a 31 year old African-American, employed female who was seen first time few weeks ago for her mood symptoms.  She struggled with anxiety, depression, anger, severe highs and lows and ruminative thoughts.  She was getting easily upset with the people and get angry and then there are times when she feel hopeless and worthless and having crying spells.  We started her on Lamictal 25 mg and now she is taking 50 mg daily.  Patient had tried Lexapro, Zoloft and Effexor.  Patient still have symptoms of mood lability, trust issues but denies any active or passive suicidal thoughts.  She feels sometimes hopeless and crying.  Her sleep still not good.  However she has noticed now she can think better and through when she is under stress.  She gave the example last week she was pulled by police officer and she handle situation somewhat better.  In the beginning she was very nervous, anxious, tremulous when police officer asked the ID and accidentally she gave her brother's ID.  Later she calm herself and able to find her own ID.  She was given citation for expired tag but she took care of tach same day and she does not have to pay the court fees.  She still gets emotional but no hallucination.  She reported no side effects from the Lamictal.  Patient lives with her 33 year old son who started school recently.  Patient told her fianc is very supportive and they all started medical plan for next year.  Patient is in therapy with Baker Pierini.   She denies any illegal substance use.  She occasionally drinks but denies any binge, intoxication or blackouts. Patient works at [employer] as Engineer, structural.    Past Psychiatric History: Denies any history of suicidal attempt, inpatient treatment, psychosis, hallucination, PTSD.  Tried Zoloft, Effexor and Lexapro from PCP but they were ineffective.  Psychiatric Specialty Exam: Physical Exam  Review of Systems  Weight 232 lb (105.2 kg).There is no height or weight on file to calculate BMI.  General Appearance: Casual  Eye Contact:  Fair  Speech:  Slow  Volume:  Decreased  Mood:  Anxious and Dysphoric  Affect:  Congruent  Thought Process:  Goal Directed  Orientation:  Full (Time, Place, and Person)  Thought Content:  Rumination  Suicidal Thoughts:  No  Homicidal Thoughts:  No  Memory:  Immediate;   Good Recent;   Good Remote;   Good  Judgement:  Intact  Insight:  Present  Psychomotor Activity:  Normal  Concentration:  Concentration: Good and Attention Span: Good  Recall:  Good  Fund of Knowledge:  Good  Language:  Good  Akathisia:  No  Handed:  Right  AIMS (if indicated):     Assets:  Communication Skills Desire for Improvement Housing Resilience Social Support Talents/Skills Transportation  ADL's:  Intact  Cognition:  WNL  Sleep:   fair      Assessment and Plan: Anxiety.  Major depressive disorder, recurrent.  Mood disorder NOS.  Patient is taking Lamictal 50 mg but is still  have a little symptoms.  So far she is tolerating and not having any rash or any itching.  We talk about either trying a different medication or increase the dose of the Lamictal.  Patient agree to keep on the same medication and willing to try a higher dose.  I will try 100 mg daily and reminded if she has any rash or any itching then she need to call us back.  I will also add low-dose Klonopin to help her anxiety and nervousness.  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  Encouraged to continue therapy with Baker Pierini.  Recommended to call us back if she has any question or any concern.  Follow-up in 6 to 8 weeks.  Follow Up Instructions:    I discussed the assessment and treatment plan with the patient. The patient was provided an opportunity to ask questions and all were answered. The patient agreed with the plan and demonstrated an understanding of the instructions.   The patient was advised to call back or seek an in-person evaluation if the symptoms worsen or if the condition fails to improve as anticipated.  Collaboration of Care: Primary Care Provider AEB notes are available in epic to review.  Patient/Guardian was advised Release of Information must be obtained prior to any record release in order to collaborate their care with an outside provider. Patient/Guardian was advised if they have not already done so to contact the registration department to sign all necessary forms in order for [place] to release information regarding their care.   Consent: Patient/Guardian gives verbal consent for treatment and assignment of benefits for services provided during this visit. Patient/Guardian expressed understanding and agreed to proceed.    I provided 35 minutes of non-face-to-face time during this encounter.   Cleotis Nipper, MD

## 2021-09-06 NOTE — Assessment & Plan Note (Signed)
Chronic for years, no prior imaging or evaluation.  MRI brain ordered and pending. Start propranolol ER 80 mg daily for headache prevention.   She will update in 3-4 weeks.

## 2021-09-06 NOTE — Assessment & Plan Note (Signed)
Controlled.  Will reduce to pantoprazole 20 mg daily.  She will update if she has to increase back up to the 40 mg dose.

## 2021-09-06 NOTE — Assessment & Plan Note (Signed)
Improved since on OPC's. Following with GYN

## 2021-09-06 NOTE — Assessment & Plan Note (Signed)
Immunizations UTD. Pap smear UTD  Discussed the importance of a healthy diet and regular exercise in order for weight loss, and to reduce the risk of further co-morbidity.  Exam stable. Labs pending.  Follow up in 1 year for repeat physical.  

## 2021-09-06 NOTE — Progress Notes (Signed)
Subjective:    Patient ID: Crystal Diaz, female    DOB: 1990-03-09, 31 y.o.   MRN: [id number]  Medication Refill Associated symptoms include headaches. Pertinent negatives include no arthralgias, chest pain, coughing, myalgias or rash.    Crystal Diaz is a very pleasant 31 y.o. female with a history of GERD, vitamin B12 deficiency, gastric bypass surgery, GAD, vitamin D deficiency for complete physical and follow up of chronic conditions.  She would also like to discuss chronic headaches. Chronic, daily headaches for years, located to the frontal lobes without radiation. She also experiences photophobia, phonophobia, and nausea at times. She was evaluated by neurology years ago, MRI brain was recommended, but she was pregnant at the time and could not proceed. She has seen her eye doctor several times, headaches were determined not to be visual. She takes Tylenol on occasion for severe headaches. She is interested in treatment.   Immunizations: -Tetanus: 2021 -Influenza: Due this season  -Covid-19: 2 vaccines   Diet: Fair diet.  Exercise: No regular exercise.  Eye exam: Completed last year Dental exam: Completes semi-annually   Pap Smear: Completed in 2023  BP Readings from Last 3 Encounters:  09/06/21 130/70  08/06/21 110/72  08/02/21 100/78       Review of Systems  Constitutional:  Negative for unexpected weight change.  HENT:  Negative for rhinorrhea.   Eyes:  Negative for visual disturbance.  Respiratory:  Negative for cough and shortness of breath.   Cardiovascular:  Negative for chest pain.  Gastrointestinal:  Negative for constipation and diarrhea.  Genitourinary:  Negative for difficulty urinating.  Musculoskeletal:  Negative for arthralgias and myalgias.  Skin:  Negative for rash.  Allergic/Immunologic: Negative for environmental allergies.  Neurological:  Positive for headaches. Negative for dizziness.  Psychiatric/Behavioral:  The patient is  nervous/anxious.          Past Medical History:  Diagnosis Date   Anemia    Anxiety    GERD (gastroesophageal reflux disease)    Headache    Lumbar radiculopathy    PCOS (polycystic ovarian syndrome)    Plantar fasciitis, bilateral     Social History   Socioeconomic History   Marital status: Single    Spouse name: Not on file   Number of children: Not on file   Years of education: Not on file   Highest education level: Not on file  Occupational History   Not on file  Tobacco Use   Smoking status: Former    Packs/day: 0.25    Years: 4.00    Total pack years: 1.00    Types: Cigarettes    Quit date: 01/19/2017    Years since quitting: 4.6   Smokeless tobacco: Never  Vaping Use   Vaping Use: Never used  Substance and Sexual Activity   Alcohol use: Yes    Alcohol/week: 0.0 standard drinks of alcohol    Comment: occ   Drug use: No   Sexual activity: Yes    Partners: Male    Birth control/protection: Pill  Other Topics Concern   Not on file  Social History Narrative   Single. In a relationship.    One son.   Works as Geographical information systems officer.    Enjoys spending time with her son, traveling.   Social Determinants of Health   Financial Resource Strain: Not on file  Food Insecurity: Not on file  Transportation Needs: Not on file  Physical Activity: Not on file  Stress: Not on file  Social Connections: Not on file  Intimate Partner Violence: Not on file    Past Surgical History:  Procedure Laterality Date   FOOT SURGERY Right    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   Procedure: LAPAROSCOPIC ROUX-EN-Y GASTRIC BYPASS WITH HIATAL HERNIA REPAIR AND UPPER ENDOSCOPY;  Surgeon: Luretha Murphy, MD;  Location: [facility];  Service: General;  Laterality: N/A;   WISDOM TOOTH EXTRACTION      Family History  Problem Relation Age of Onset   Diabetes Mother    Pancreatic cancer Father 58   Cancer Brother        Oral   Mental illness Brother     No Known Allergies  Current  Outpatient Medications on File Prior to Visit  Medication Sig Dispense Refill   clonazePAM (KLONOPIN) 0.5 MG tablet Take 1 tablet (0.5 mg total) by mouth daily as needed for anxiety. 15 tablet 0   cyclobenzaprine (FLEXERIL) 5 MG tablet Take 1 tablet (5 mg total) by mouth 2 (two) times daily as needed for muscle spasms. 20 tablet 0   desogestrel-ethinyl estradiol (MIRCETTE) 0.15-0.02/0.01 MG (21/5) tablet Take 1 tablet by mouth daily. 84 tablet 2   lamoTRIgine (LAMICTAL) 100 MG tablet Take 1 tablet (100 mg total) by mouth daily. 30 tablet 1   pantoprazole (PROTONIX) 40 MG tablet Take 1 tablet (40 mg total) by mouth daily. 90 tablet 3   No current facility-administered medications on file prior to visit.    BP 130/70   Pulse 91   Temp 98.3 F (36.8 C)   Resp 16   Ht 5\' 9"  (1.753 m)   Wt 232 lb 2 oz (105.3 kg)   SpO2 99%   BMI 34.28 kg/m  Objective:   Physical Exam HENT:     Right Ear: Tympanic membrane and ear canal normal.     Left Ear: Tympanic membrane and ear canal normal.     Nose: Nose normal.  Eyes:     Conjunctiva/sclera: Conjunctivae normal.     Pupils: Pupils are equal, round, and reactive to light.  Neck:     Thyroid: No thyromegaly.  Cardiovascular:     Rate and Rhythm: Normal rate and regular rhythm.     Heart sounds: No murmur heard. Pulmonary:     Effort: Pulmonary effort is normal.     Breath sounds: Normal breath sounds. No rales.  Abdominal:     General: Bowel sounds are normal.     Palpations: Abdomen is soft.     Tenderness: There is no abdominal tenderness.  Musculoskeletal:        General: Normal range of motion.     Cervical back: Neck supple.  Lymphadenopathy:     Cervical: No cervical adenopathy.  Skin:    General: Skin is warm and dry.     Findings: No rash.  Neurological:     Mental Status: She is alert and oriented to person, place, and time.     Cranial Nerves: No cranial nerve deficit.     Deep Tendon Reflexes: Reflexes are normal and  symmetric.  Psychiatric:        Mood and Affect: Mood normal.           Assessment & Plan:   Problem List Items Addressed This Visit       Digestive   Vitamin B12 deficiency due to intestinal malabsorption    Not currently taking.  Repeat vitamin B12 level pending.      Relevant Orders   Vitamin B12  GERD (gastroesophageal reflux disease)    Controlled.  Will reduce to pantoprazole 20 mg daily.  She will update if she has to increase back up to the 40 mg dose.      Relevant Medications   pantoprazole (PROTONIX) 20 MG tablet     Genitourinary   Abnormal uterine bleeding    Improved since on OPC's. Following with GYN        Other   S/P gastric bypass   Relevant Orders   Lipid panel   Comprehensive metabolic panel   CBC   Vitamin B12   VITAMIN D 25 Hydroxy (Vit-D Deficiency, Fractures)   TSH   GAD (generalized anxiety disorder)    Improving gradually. Following with psychiatry, office notes reviewed from today.  Continue Lamictal 100 mg daily, clonazepam 0.5 mg as needed. Continue regular therapy.      Encounter for annual general medical examination with abnormal findings in adult    Immunizations UTD. Pap smear UTD.  Discussed the importance of a healthy diet and regular exercise in order for weight loss, and to reduce the risk of further co-morbidity.  Exam stable. Labs pending.  Follow up in 1 year for repeat physical.       Vitamin D deficiency    Not currently taking vitamin D.   Repeat vitamin D level pending.      Relevant Orders   VITAMIN D 25 Hydroxy (Vit-D Deficiency, Fractures)   MDD (major depressive disorder)    Improving gradually. Following with psychiatry, office notes reviewed from today.  Continue Lamictal 100 mg daily, clonazepam 0.5 mg as needed. Continue regular therapy.      Frequent headaches - Primary    Chronic for years, no prior imaging or evaluation.  MRI brain ordered and pending. Start propranolol  ER 80 mg daily for headache prevention.   She will update in 3-4 weeks.      Relevant Medications   propranolol ER (INDERAL LA) 80 MG 24 hr capsule   Other Relevant Orders   MR BRAIN WO CONTRAST       Doreene Nest, NP

## 2021-09-06 NOTE — Assessment & Plan Note (Signed)
Improving gradually. Following with psychiatry, office notes reviewed from today.  Continue Lamictal 100 mg daily, clonazepam 0.5 mg as needed. Continue regular therapy. 

## 2021-09-06 NOTE — Patient Instructions (Signed)
Try the reduced dose of pantoprazole at 20 mg daily for heartburn.  You will be contacted regarding your MRI brain.  Please let us know if you have not been contacted within two weeks.   Start propanolol ER 80 mg at bedtime for headache prevention. Please update me in a few weeks as discussed.  Stop by the lab prior to leaving today. I will notify you of your results once received.   It was a pleasure to see you today!  Preventive Care 60-33 Years Old, Female Preventive care refers to lifestyle choices and visits with your health care provider that can promote health and wellness. Preventive care visits are also called wellness exams. What can I expect for my preventive care visit? Counseling During your preventive care visit, your health care provider may ask about your: Medical history, including: Past medical problems. Family medical history. Pregnancy history. Current health, including: Menstrual cycle. Method of birth control. Emotional well-being. Home life and relationship well-being. Sexual activity and sexual health. Lifestyle, including: Alcohol, nicotine or tobacco, and drug use. Access to firearms. Diet, exercise, and sleep habits. Work and work Statistician. Sunscreen use. Safety issues such as seatbelt and bike helmet use. Physical exam Your health care provider may check your: Height and weight. These may be used to calculate your BMI (body mass index). BMI is a measurement that tells if you are at a healthy weight. Waist circumference. This measures the distance around your waistline. This measurement also tells if you are at a healthy weight and may help predict your risk of certain diseases, such as type 2 diabetes and high blood pressure. Heart rate and blood pressure. Body temperature. Skin for abnormal spots. What immunizations do I need?  Vaccines are usually given at various ages, according to a schedule. Your health care provider will recommend  vaccines for you based on your age, medical history, and lifestyle or other factors, such as travel or where you work. What tests do I need? Screening Your health care provider may recommend screening tests for certain conditions. This may include: Pelvic exam and Pap test. Lipid and cholesterol levels. Diabetes screening. This is done by checking your blood sugar (glucose) after you have not eaten for a while (fasting). Hepatitis B test. Hepatitis C test. HIV (human immunodeficiency virus) test. STI (sexually transmitted infection) testing, if you are at risk. BRCA-related cancer screening. This may be done if you have a family history of breast, ovarian, tubal, or peritoneal cancers. Talk with your health care provider about your test results, treatment options, and if necessary, the need for more tests. Follow these instructions at home: Eating and drinking  Eat a healthy diet that includes fresh fruits and vegetables, whole grains, lean protein, and low-fat dairy products. Take vitamin and mineral supplements as recommended by your health care provider. Do not drink alcohol if: Your health care provider tells you not to drink. You are pregnant, may be pregnant, or are planning to become pregnant. If you drink alcohol: Limit how much you have to 0-1 drink a day. Know how much alcohol is in your drink. In the [place] one drink equals one 12 oz bottle of beer (355 mL), one 5 oz glass of wine (148 mL), or one 1 oz glass of hard liquor (44 mL). Lifestyle Brush your teeth every morning and night with fluoride toothpaste. Floss one time each day. Exercise for at least 30 minutes 5 or more days each week. Do not use any products that contain nicotine  or tobacco. These products include cigarettes, chewing tobacco, and vaping devices, such as e-cigarettes. If you need help quitting, ask your health care provider. Do not use drugs. If you are sexually active, practice safe sex. Use a condom or  other form of protection to prevent STIs. If you do not wish to become pregnant, use a form of birth control. If you plan to become pregnant, see your health care provider for a prepregnancy visit. Find healthy ways to manage stress, such as: Meditation, yoga, or listening to music. Journaling. Talking to a trusted person. Spending time with friends and family. Minimize exposure to UV radiation to reduce your risk of skin cancer. Safety Always wear your seat belt while driving or riding in a vehicle. Do not drive: If you have been drinking alcohol. Do not ride with someone who has been drinking. If you have been using any mind-altering substances or drugs. While texting. When you are tired or distracted. Wear a helmet and other protective equipment during sports activities. If you have firearms in your house, make sure you follow all gun safety procedures. Seek help if you have been physically or sexually abused. What's next? Go to your health care provider once a year for an annual wellness visit. Ask your health care provider how often you should have your eyes and teeth checked. Stay up to date on all vaccines. This information is not intended to replace advice given to you by your health care provider. Make sure you discuss any questions you have with your health care provider. Document Revised: 06/27/2020 Document Reviewed: 06/27/2020 Elsevier Patient Education  [place].

## 2021-09-06 NOTE — Assessment & Plan Note (Signed)
Not currently taking.  Repeat vitamin B12 level pending.

## 2021-09-06 NOTE — Assessment & Plan Note (Signed)
Not currently taking vitamin D. Repeat vitamin D level pending. 

## 2021-09-06 NOTE — Assessment & Plan Note (Signed)
Improving gradually. Following with psychiatry, office notes reviewed from today.  Continue Lamictal 100 mg daily, clonazepam 0.5 mg as needed. Continue regular therapy.

## 2021-09-08 DIAGNOSIS — E538 Deficiency of other specified B group vitamins: Secondary | ICD-10-CM

## 2021-09-08 DIAGNOSIS — R519 Headache, unspecified: Secondary | ICD-10-CM

## 2021-09-08 DIAGNOSIS — E559 Vitamin D deficiency, unspecified: Secondary | ICD-10-CM

## 2021-09-09 ENCOUNTER — Other Ambulatory Visit: Payer: Self-pay

## 2021-09-09 MED ORDER — CYANOCOBALAMIN 1000 MCG/ML IJ SOLN
1000.0000 ug | INTRAMUSCULAR | 0 refills | Status: DC
  Filled 2021-09-09: qty 3, 90d supply, fill #0

## 2021-09-09 MED ORDER — VITAMIN D (ERGOCALCIFEROL) 1.25 MG (50000 UNIT) PO CAPS
ORAL_CAPSULE | ORAL | 0 refills | Status: DC
  Filled 2021-09-09: qty 12, 84d supply, fill #0

## 2021-09-09 MED ORDER — "BD LUER-LOK SYRINGE 23G X 1"" 3 ML MISC"
0 refills | Status: DC
  Filled 2021-09-09: qty 3, fill #0
  Filled 2021-11-27: qty 3, 30d supply, fill #0

## 2021-09-20 ENCOUNTER — Ambulatory Visit
Admission: RE | Admit: 2021-09-20 | Discharge: 2021-09-20 | Disposition: A | Discharge status: Home / Self Care | Payer: No Typology Code available for payment source | Source: Ambulatory Visit | Attending: Primary Care | Admitting: Primary Care

## 2021-09-20 ENCOUNTER — Encounter (HOSPITAL_COMMUNITY): Payer: Self-pay | Admitting: *Deleted

## 2021-09-20 ENCOUNTER — Encounter: Payer: Self-pay | Admitting: Obstetrics and Gynecology

## 2021-09-20 DIAGNOSIS — R519 Headache, unspecified: Secondary | ICD-10-CM | POA: Insufficient documentation

## 2021-09-27 ENCOUNTER — Ambulatory Visit (INDEPENDENT_AMBULATORY_CARE_PROVIDER_SITE_OTHER): Payer: No Typology Code available for payment source | Admitting: Psychology

## 2021-09-27 ENCOUNTER — Other Ambulatory Visit: Payer: Self-pay

## 2021-09-27 DIAGNOSIS — F4323 Adjustment disorder with mixed anxiety and depressed mood: Secondary | ICD-10-CM | POA: Diagnosis not present

## 2021-09-27 NOTE — Progress Notes (Signed)
[name]/Therapist Progress Note  Patient ID: Crystal Diaz, MRN: [id number],    Date: 09/27/2021  Time Spent: 50 mins  Treatment Type: Individual Therapy  Reported Symptoms: Pt presented for a follow up session, via webex video.  Pt granted consent for the session, stating that she is in her car with no one else present.  I shared with pt that I am in my office with no one else here either.  Previous pt of Crystal Diaz; saw her for about 3 yrs.    Mental Status Exam: Appearance:  Casual     Behavior: Appropriate  Motor: Normal  Speech/Language:  Clear and Coherent  Affect: Appropriate  Mood: normal  Thought process: normal  Thought content:   WNL  Sensory/Perceptual disturbances:   WNL  Orientation: oriented to person, place, time/date, and situation  Attention: Good  Concentration: Good  Memory: WNL  Fund of knowledge:  Good  Insight:   Good  Judgment:  Good  Impulse Control: Good   Risk Assessment: Danger to Self:  No Self-injurious Behavior: No Danger to Others: No Duty to Warn:no Physical Aggression / Violence:No  Access to Firearms a concern: No  Gang Involvement:No   Subjective: Pt shares that she "has been OK since our last session 05/31/21.  I have met with my psychiatrist twice now and he has given me Klonopin and Lamictal.  My PCP did lab work and is addressing things there."  Pt shares that she had an MRI last week related to headaches that she has been having.  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  She has actually cried a couple of times at work.  She is trying to be very accountable for her job.  She is often hard on herself if she makes a mistake at work.  Pt did talk with her mgr about this issue with the coworker and the mgr did not handle it very well for pt; she will eventually talk with her mgr about not feeling supported by her.  Pt shares that her boyfriend is  working as a Actuary for [employer] and has been working in [employer]; she and Crystal Diaz have been going there on the weekend to see him.  Crystal Diaz has been struggling in school ("he is continuing to be him"); just started [employer] and is in the 6th grade.   Pt shares that her brother is doing OK and is still in jail.  Her brother's [name]) attorney has hired a Teacher, music and pt will be mtg with the doctor next week.  His next court date is 10/2.  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  Pt shares that her visits to [facility] to see her boyfriend has been good.  She has not had much time to do much else for herself with work and the beginning of school.  She is planning to go to the beach next week for a long weekend.  Encouraged pt to continue with her self care activities and we will meet next week for a follow up session.  Interventions: Cognitive Behavioral Therapy  Diagnosis:Adjustment disorder with mixed anxiety and depressed mood  Plan: Treatment Plan Strengths/Abilities:  Intelligent, Intuitive, Willing to participate in therapy Treatment Preferences:  Outpatient Individual Therapy Statement of Needs:  Patient is to use CBT, mindfulness and coping skills to help manage and/or decrease symptoms associated with their diagnosis. Symptoms:  Depressed/Irritable mood, worry, social withdrawal Problems Addressed:  Depressive thoughts, Sadness, Sleep issues, etc. Long Term Goals:  Pt to reduce overall level, frequency, and intensity of the feelings of depression/anxiety as evidenced by decreased irritability, negative self talk, and helpless feelings from 6 to 7 days/week to 0 to 1 days/week, per client report, for at least 3 consecutive months.  Progress: 20% Short Term Goals:  Pt to verbally express understanding of the relationship between feelings of depression/anxiety and their impact on thinking patterns and behaviors.  Pt to verbalize an understanding of the role that  distorted thinking plays in creating fears, excessive worry, and ruminations.  Progress: 20% Target Date:  12/28/2021 Frequency:  Bi-weekly Modality:  Cognitive Behavioral Therapy Interventions by Therapist:  Therapist will use CBT, Mindfulness exercises, Coping skills and Referrals, as needed by client. Client has verbally approved this treatment plan.  Ivan Anchors, [facility]

## 2021-09-30 ENCOUNTER — Other Ambulatory Visit: Payer: Self-pay

## 2021-09-30 MED ORDER — METHYLPREDNISOLONE 4 MG PO TBPK
ORAL_TABLET | ORAL | 0 refills | Status: DC
  Filled 2021-09-30: qty 21, 6d supply, fill #0

## 2021-10-04 ENCOUNTER — Ambulatory Visit: Payer: No Typology Code available for payment source | Admitting: Psychology

## 2021-10-13 ENCOUNTER — Other Ambulatory Visit: Payer: Self-pay

## 2021-10-13 MED ORDER — PROPRANOLOL HCL ER 120 MG PO CP24
120.0000 mg | ORAL_CAPSULE | Freq: Every day | ORAL | 0 refills | Status: DC
  Filled 2021-10-13: qty 30, 30d supply, fill #0

## 2021-10-14 ENCOUNTER — Other Ambulatory Visit: Payer: Self-pay

## 2021-10-15 ENCOUNTER — Other Ambulatory Visit: Payer: Self-pay

## 2021-10-18 ENCOUNTER — Encounter (HOSPITAL_COMMUNITY): Payer: Self-pay | Admitting: Psychiatry

## 2021-10-18 ENCOUNTER — Telehealth (HOSPITAL_COMMUNITY): Payer: Self-pay | Admitting: Psychiatry

## 2021-10-18 ENCOUNTER — Telehealth (HOSPITAL_BASED_OUTPATIENT_CLINIC_OR_DEPARTMENT_OTHER): Payer: No Typology Code available for payment source | Admitting: Psychiatry

## 2021-10-18 ENCOUNTER — Other Ambulatory Visit: Payer: Self-pay

## 2021-10-18 DIAGNOSIS — F331 Major depressive disorder, recurrent, moderate: Secondary | ICD-10-CM | POA: Diagnosis not present

## 2021-10-18 DIAGNOSIS — F419 Anxiety disorder, unspecified: Secondary | ICD-10-CM | POA: Diagnosis not present

## 2021-10-18 MED ORDER — CLONAZEPAM 0.5 MG PO TABS
0.5000 mg | ORAL_TABLET | Freq: Every day | ORAL | 0 refills | Status: DC | PRN
  Filled 2021-10-18: qty 15, 15d supply, fill #0

## 2021-10-18 MED ORDER — LAMOTRIGINE 100 MG PO TABS
100.0000 mg | ORAL_TABLET | Freq: Every day | ORAL | 0 refills | Status: DC
Start: 2021-10-18 — End: 2021-11-07
  Filled 2021-10-18: qty 30, 30d supply, fill #0

## 2021-10-18 NOTE — Progress Notes (Signed)
Virtual Visit via telephone Note  I connected with [name] on 10/18/21 at 11:20 AM EDT by a telephone and verified that I am speaking with the correct person using two identifiers.  Location: Patient: Work Provider: Economist   I discussed the limitations of evaluation and management by telemedicine and the availability of in person appointments. The patient expressed understanding and agreed to proceed.  History of Present Illness: Patient is evaluated by phone session.  She reported feeling very overwhelmed and did not had a chance to do the video session.  She is very anxious, stressed and having ruminative thoughts.  She had a hard time dealing her daily life with too many stressors going on.  Her 70 year old son having a lot of issues at school because he is not focusing, having the fight at school and she is concerned about him.  Patient's brother incarcerated with new charges and she is very upset because brother has mental illness and she is trying to address this issue with the government but sometimes she feels hopeless.  She also having issues with her fianc because she believes he is lying about his presence when he is out of the town.  She still struggle with loss of her father who died in 10-Nov-2022.  Recently her great aunt died who stayed with her in the last November.  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  She works for [employer].  Patient reported poor sleep, racing thoughts, crying spells, ruminative and negative thoughts.  She had fleeting suicidal thoughts but no plan or any intent.  Sometimes she feels hopeless and worthless.  She has lack of appetite and feels fatigued.  Denies any hallucination or any paranoia.  She admitted taking Klonopin more frequently because she gets so nervous and anxious.  She is taking Lamictal 100 mg daily.  She has no rash, itching, tremors or shakes.  In the past she had tried Zoloft, Lexapro and Effexor with  poor outcome.    Past Psychiatric History: Denies any history of suicidal attempt, inpatient treatment, psychosis, hallucination, PTSD.  Tried Zoloft, Effexor and Lexapro from PCP but they were ineffective.  Psychiatric Specialty Exam: Physical Exam  Review of Systems  Weight 230 lb (104.3 kg).There is no height or weight on file to calculate BMI.  General Appearance: NA  Eye Contact:  NA  Speech:  Clear and Coherent  Volume:  Decreased  Mood:  Anxious and Dysphoric  Affect:  NA  Thought Process:  Goal Directed  Orientation:  Full (Time, Place, and Person)  Thought Content:  Rumination  Suicidal Thoughts:  No  Homicidal Thoughts:  No  Memory:  Immediate;   Good Recent;   Fair Remote;   Fair  Judgement:  Intact  Insight:  Present  Psychomotor Activity:  NA  Concentration:  Concentration: Fair and Attention Span: Fair  Recall:  Good  Fund of Knowledge:  Good  Language:  Good  Akathisia:  No  Handed:  Right  AIMS (if indicated):     Assets:  Communication Skills Desire for Improvement Housing Social Support Transportation  ADL's:  Intact  Cognition:  WNL  Sleep:         Assessment and Plan: Anxiety.  Major depressive disorder, recurrent.  Discuss chronic stressors and new stressors in her life.  She is feeling overwhelmed and having a lot of negative and ruminative thoughts.  Though she denies any fleeting suicidal thoughts but no plan or  any intent.  I discussed to consider IOP to help her coping skills.  I briefly explain about the program and patient is willing to try.  I will send a message to Dellia Nims who is a Scientist, clinical (histocompatibility and immunogenetics) for IOP to provide more details.  She does not want to change or add any new medication at this time.  Recommend if she consider then provider at IOP may change the medication.  We agreed to keep the Lamictal 100 mg daily and Klonopin 0.5 mg to take as needed for severe anxiety.  Discussed safety concern that anytime having active suicidal  thoughts or homicidal thought then she need to call 911 or go to local emergency room.  Follow Up Instructions:    I discussed the assessment and treatment plan with the patient. The patient was provided an opportunity to ask questions and all were answered. The patient agreed with the plan and demonstrated an understanding of the instructions.   The patient was advised to call back or seek an in-person evaluation if the symptoms worsen or if the condition fails to improve as anticipated.  I provided 40 minutes of non-face-to-face time during this encounter.   Kathlee Nations, MD

## 2021-10-18 NOTE — Telephone Encounter (Signed)
D:  Dr. Adele Schilder referred pt to [facility].  A:  Placed call to orient pt and to schedule a CCA.  CCA is scheduled for 10-21-21 @ 9:30 a.m.Marland Kitchen  Pt will then tell the case manager when she can start [place].  Case manager offered 10-22-21, but pt states her work schedule is out already for work next week.  "I know I should be putting myself first but I can't do that to my job and not show up next week."  Inform Dr. Adele Schilder and Tammy Sours, LPN.  R:  Pt receptive.

## 2021-10-21 ENCOUNTER — Other Ambulatory Visit: Payer: Self-pay

## 2021-10-21 ENCOUNTER — Telehealth (HOSPITAL_COMMUNITY): Payer: Self-pay | Admitting: *Deleted

## 2021-10-21 ENCOUNTER — Other Ambulatory Visit (HOSPITAL_COMMUNITY): Payer: No Typology Code available for payment source | Attending: Psychiatry | Admitting: Psychiatry

## 2021-10-21 ENCOUNTER — Other Ambulatory Visit: Payer: Self-pay | Admitting: Primary Care

## 2021-10-21 DIAGNOSIS — K219 Gastro-esophageal reflux disease without esophagitis: Secondary | ICD-10-CM

## 2021-10-21 DIAGNOSIS — F339 Major depressive disorder, recurrent, unspecified: Secondary | ICD-10-CM | POA: Insufficient documentation

## 2021-10-21 MED ORDER — PANTOPRAZOLE SODIUM 20 MG PO TBEC
20.0000 mg | DELAYED_RELEASE_TABLET | Freq: Every day | ORAL | 0 refills | Status: DC
  Filled 2021-10-21: qty 90, 90d supply, fill #0
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
## 2021-10-21 NOTE — Telephone Encounter (Signed)
Pt left VM asking this nurse to return her call. No further information was left, except c/o message too long. Writer returned pt call asking how I could help and this seems to have irritated her. Pt berated Probation officer for not knowing why she called. Pt finally stated that she will joining IOP ( as I was made aware by Dellia Nims, and chart notes) and will need FMLA paperwork essentially. Writer advised that she would give pt our fax number if needed for [employer] to send FMLA paperwork. Pt stated that she would just speak to provider about it because I wasn't aware of why she left the message. Writer again offered to help and stating that I would be happy to get the paperwork started for her. Pt said that writer was patronizing her and never mind she would take it up with Dr. Adele Schilder on next visit. Writer apologized and attempted to advise that this can be taken care of quickly once paperwork is received. Pt indicated that she was very unhappy and again stated that she will take this up with provider. FYI.

## 2021-10-21 NOTE — Telephone Encounter (Signed)
Thanks for the update.  We will discuss on her next appointment.

## 2021-10-22 ENCOUNTER — Telehealth (HOSPITAL_COMMUNITY): Payer: Self-pay | Admitting: Psychiatry

## 2021-10-22 NOTE — Telephone Encounter (Signed)
D:  Placed call to patient to see when will she be starting MH-IOP?  According to pt, she is wanting to know when did the psychiatrist want her to start.  "It was my understanding that I was to reach out to his office and talk to the nurse and she would tell me what I needed to do."  Pt had questions about the leave of absence forms, etc.  "Dr. Adele Schilder said he would take me out of work, so I was waiting for the nurse to do her job."   A:  Provided pt with support.  Informed her that she would have to start the claim.  Answered all patient's questions.  Encouraged pt to contact [employer] for the paperwork and have them to fax it to Dr.  Olea office.  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  Inform Dr. Adele Schilder and Tammy Sours, LPN.  R:  Pt receptive.

## 2021-10-23 ENCOUNTER — Telehealth (HOSPITAL_COMMUNITY): Payer: No Typology Code available for payment source | Admitting: Psychiatry

## 2021-10-23 NOTE — Telephone Encounter (Signed)
Spoke to patient today. She is frustrated about [employer] paperwork. Emphasized that she needs to call [employer] to start paperwork. She finally called today [employer] today and had Intake #. She has given the start date as of 23 rd. Will do paperwork once we received. Reassurance given and explain the process of FMLA. Patient verbalize understanding.

## 2021-10-28 ENCOUNTER — Telehealth (HOSPITAL_COMMUNITY): Payer: Self-pay | Admitting: Psychiatry

## 2021-10-28 ENCOUNTER — Telehealth: Payer: Self-pay | Admitting: Primary Care

## 2021-10-28 DIAGNOSIS — Z0279 Encounter for issue of other medical certificate: Secondary | ICD-10-CM

## 2021-10-28 NOTE — Telephone Encounter (Signed)
Pt called in stated FMLA paperwork was fax to the office wants to know  if it was receive .# [phone]

## 2021-10-28 NOTE — Telephone Encounter (Signed)
I called patient to notify her that we did receive her form.  This is a continuation of her previous leave paperwork.  Patient will send a mychart msg with the dates that are needed.  When completed and faxed, patient does not need a copy.  She has a [employer] online account that she will be able to view the paperwork on.  Will send mychart msg to patient when paperwork is completed and faxed.

## 2021-10-29 NOTE — Telephone Encounter (Signed)
Completed and placed on Kate's desk 

## 2021-10-29 NOTE — Telephone Encounter (Signed)
Form has been placed in your inbox for completion and signing.  This is a continuation of previous leave paperwork.  I sent patient a msg via mychart that we need the exact dates for leave.  When paperwork is complete, patient does not need a copy.

## 2021-10-31 ENCOUNTER — Other Ambulatory Visit: Payer: Self-pay

## 2021-10-31 MED ORDER — DICLOFENAC SODIUM 1 % EX GEL
CUTANEOUS | 0 refills | Status: DC
  Filled 2021-10-31: qty 100, 13d supply, fill #0

## 2021-11-01 ENCOUNTER — Telehealth (HOSPITAL_COMMUNITY): Payer: Self-pay | Admitting: Psychiatry

## 2021-11-01 ENCOUNTER — Ambulatory Visit: Payer: No Typology Code available for payment source | Admitting: Psychology

## 2021-11-01 NOTE — Telephone Encounter (Signed)
Form faxed, copy send to charge and copy sent to scan. Message sent to patient to see if they would like copy for her records. Will hold in folder until we hear from her.

## 2021-11-04 ENCOUNTER — Encounter (HOSPITAL_COMMUNITY): Payer: Self-pay | Admitting: Psychiatry

## 2021-11-04 ENCOUNTER — Other Ambulatory Visit: Payer: Self-pay

## 2021-11-04 ENCOUNTER — Other Ambulatory Visit (HOSPITAL_COMMUNITY): Payer: No Typology Code available for payment source | Attending: Psychiatry | Admitting: Psychiatry

## 2021-11-04 DIAGNOSIS — Z79899 Other long term (current) drug therapy: Secondary | ICD-10-CM | POA: Insufficient documentation

## 2021-11-04 DIAGNOSIS — F419 Anxiety disorder, unspecified: Secondary | ICD-10-CM

## 2021-11-04 DIAGNOSIS — R45851 Suicidal ideations: Secondary | ICD-10-CM | POA: Diagnosis not present

## 2021-11-04 DIAGNOSIS — F331 Major depressive disorder, recurrent, moderate: Secondary | ICD-10-CM | POA: Diagnosis present

## 2021-11-04 MED ORDER — CELECOXIB 100 MG PO CAPS
100.0000 mg | ORAL_CAPSULE | Freq: Every day | ORAL | 0 refills | Status: DC
  Filled 2021-11-04: qty 30, 30d supply, fill #0

## 2021-11-04 NOTE — Progress Notes (Addendum)
Psychiatric IOP Initial Adult Assessment   Patient Identification: Crystal Diaz MRN:  [id number] Date of Evaluation:  11/04/2021 Virtual Visit via Video Note  I connected with Crystal Diaz on 11/04/21 at  9:00 AM EDT by a video enabled telemedicine application and verified that I am speaking with the correct person using two identifiers.  Location: Patient: Home Provider: Clinic   I discussed the limitations of evaluation and management by telemedicine and the availability of in person appointments. The patient expressed understanding and agreed to proceed.  I discussed the assessment and treatment plan with the patient. The patient was provided an opportunity to ask questions and all were answered. The patient agreed with the plan and demonstrated an understanding of the instructions.   The patient was advised to call back or seek an in-person evaluation if the symptoms worsen or if the condition fails to improve as anticipated.  I provided 40 minutes of non-face-to-face time during this encounter.   Crystal Reichert, MD  Referral Source: Dr Crystal Diaz Chief Complaint:   Chief Complaint  Patient presents with   Depression   Anxiety   Stress   Visit Diagnosis:    ICD-10-CM   1. MDD (major depressive disorder), recurrent episode, moderate (HCC)  F33.1     2. Anxiety  F41.9       History of Present Illness: Patient is a 31 year old female with past psychiatric history of MDD, GAD present to IOP program for worsening depression and anxiety.  Patient follows Dr. Adele Diaz at [facility] outpatient clinic at [facility].  Patient started IOP program on 11/04/2021.   Patient states she has been feeling more depressed lately and feels like her therapy and medications are not working.  She reports that she started having passive suicidal ideations recently but feels safe at home.  Contracts for safety at this time. She reports multiple stressors including unresolved grief due to multiple losses in  the family. Her dad passed in 2011-04-22 due to pancreatic cancer, maternal grand mother and father passed away in 04/21/13 and 04/22/2014, mom passed away due to Crystal Diaz in 04-22-18, and great aunt passed away this year.  She reports that her brother was diagnosed with schizophrenia recently and is currently incarcerated and is being charged with first degree murder.  She reports that her son who is 78 year old does not listen to her.  She reports multiple relationship issues at home and work and feels frustrated.  She endorses depressed mood, poor appetite, poor and disturbed sleep, anhedonia, fatigue,  low energy, decreased concentration, and poor memory She denies manic symptoms or episode including pressured speech, decreased need for sleep, hypersexuality, increased spending, racing thoughts, flight of ideas and grandiosity.    Currently, She denies active Suicidal ideations, Homicidal ideations, auditory and visual hallucinations.  She reports passive suicidal ideations without intent or plan.  Contracts for safety at this time.  She denies any paranoia.  She denies any history of physical, verbal, and sexual abuse. She was anxiety with panic attacks.   Past Psychiatric Hx:  Previous Psych Diagnoses: MDD, GAD Prior inpatient treatment: Denies Current meds: Lamictal 100 mg daily, Klonopin 0.5 mg daily as needed for anxiety Psychotherapy hx: Getting therapy from Crystal Diaz Previous suicidal attempts: Denies Previous medication trials: Multiple, Zoloft, Effexor, Lexapro which were not effective Current therapist: Dennison Diaz  Substance Abuse Hx: Alcohol: Drinks wine occasionally Tobacco:Denies Illicit drugs-Denies Rehab Crystal Diaz:Crystal Diaz Seizures, DUI's, DT's- Denies  Past Medical History: Medical Diagnoses: Vitamin B12 deficiency Home Rx:  Protonix, vitamin B 12 monthly injections, propanolol ER 120 mg daily at bedtime for headaches. 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   Procedure: LAPAROSCOPIC ROUX-EN-Y GASTRIC BYPASS WITH HIATAL HERNIA REPAIR AND UPPER ENDOSCOPY;  Surgeon: Crystal Hausen, MD;  Location: [facility];  Service: General;  Laterality: N/A;   WISDOM TOOTH EXTRACTION      Family Psychiatric History: See HPI  Family History:  Family History  Problem Relation Age of Onset   Diabetes Mother    Pancreatic cancer Father 71   Cancer Brother        Oral   Mental illness Brother     Social History:   Social History   Socioeconomic History   Marital status: Single    Spouse name: Not on file   Number of children: Not on file   Years of education: Not on file   Highest education level: Not on file  Occupational History   Not on file  Tobacco Use   Smoking status: Former     Packs/day: 0.25    Years: 4.00    Total pack years: 1.00    Types: Cigarettes    Quit date: 01/19/2017    Years since quitting: 4.7   Smokeless tobacco: Never  Vaping Use   Vaping Use: Never used  Substance and Sexual Activity   Alcohol use: Yes    Alcohol/week: 0.0 standard drinks of alcohol    Comment: occ   Drug use: No   Sexual activity: Yes    Partners: Male    Birth control/protection: Pill  Other Topics Concern   Not on file  Social History Narrative   Single. In a relationship.    One son.   Works as Biomedical engineer.    Enjoys spending time with her son, traveling.   Social Determinants of Health   Financial Resource Strain: Not on file  Food Insecurity: Not on file  Transportation Needs: Not on file  Physical Activity: Not on file  Stress: Not on file  Social Connections: Not on file    Additional Social History: See HPI  Allergies:  No Known Allergies  Metabolic Disorder Labs: No results found for: "HGBA1C", "MPG" No results found for: "PROLACTIN" Lab Results  Component Value Date   CHOL 129 09/06/2021   TRIG 65.0 09/06/2021   HDL 54.40 09/06/2021   CHOLHDL 2 09/06/2021   VLDL 13.0 09/06/2021   LDLCALC 62  09/06/2021   Vincent 66 06/08/2020   Lab Results  Component Value Date   TSH 1.08 09/06/2021    Therapeutic Level Labs: No results found for: "LITHIUM" No results found for: "CBMZ" No results found for: "VALPROATE"  Current Medications: Current Outpatient Medications  Medication Sig Dispense Refill   clonazePAM (KLONOPIN) 0.5 MG tablet Take 1 tablet (0.5 mg total) by mouth daily as needed for anxiety. 15 tablet 0   cyanocobalamin (VITAMIN B12) 1000 MCG/ML injection Inject 1 mL (1,000 mcg total) into the muscle every 30 (thirty) days. 3 mL 0   cyclobenzaprine (FLEXERIL) 5 MG tablet Take 1 tablet (5 mg total) by mouth 2 (two) times daily as needed for muscle spasms. 20 tablet 0   desogestrel-ethinyl estradiol (MIRCETTE) 0.15-0.02/0.01 MG  (21/5) tablet Take 1 tablet by mouth daily. 84 tablet 2   diclofenac Sodium (VOLTAREN ARTHRITIS PAIN) 1 % GEL APPLY 2 GRAMS TO THE AFFECTED AREA(S) BY TOPICAL ROUTE 4 TIMES PER DAY 100 g 0   lamoTRIgine (LAMICTAL) 100 MG tablet Take 1 tablet (100 mg total) by mouth daily. 30 tablet 0   methylPREDNISolone (MEDROL) 4 MG TBPK tablet Take 1 dose pk by oral route. 21 tablet 0   pantoprazole (PROTONIX) 20 MG tablet Take 1 tablet (20 mg total) by mouth daily. For heartburn 90 tablet 0   propranolol ER (INDERAL LA) 120 MG 24 hr capsule Take 1 capsule (120 mg total) by mouth at bedtime. For headache prevention 30 capsule 0   SYRINGE/NEEDLE, DISP, 1 ML 23G X 1" 1 ML MISC Use as directed 3 each 0   Vitamin D, Ergocalciferol, (DRISDOL) 1.25 MG (50000 UNIT) CAPS capsule Take 1 capsule by mouth once weekly for 12 weeks. 12 capsule 0   No current facility-administered medications for this visit.    Musculoskeletal: Strength & Muscle Tone: Not able to assess due to virtual visit [place]: Not well to assess due to virtual visit Patient leans: Not well to assess due to virtual visit  Psychiatric Specialty Exam: Review of Systems  There were no vitals taken for this visit.There is no height or weight on file to calculate BMI.  General Appearance: Casual  Eye Contact:  Fair  Speech:  Clear and Coherent and Normal Rate  Volume:  Normal  Mood:  Anxious and Depressed  Affect:  Congruent and Depressed  Thought Process:  Coherent and Linear  Orientation:  Full (Time, Place, and Person)  Thought Content:  Logical  Suicidal Thoughts:  No passive thoughts. Contracts for safety  Homicidal Thoughts:  No  Memory:  Immediate;   Good Recent;   Good  Judgement:  Good  Insight:  Good  Psychomotor Activity:  Normal  Concentration:  Concentration: Fair and Attention Span: Fair  Recall:  Good  Fund of Knowledge:Good  Language: Good  Akathisia:  No  Handed:  Right  AIMS (if indicated):  not done  Assets:   Communication Skills Desire for Improvement Financial Resources/Insurance Housing Resilience Social Support Vocational/Educational  ADL's:  Intact  Cognition: WNL  Sleep:  Fair   Screenings: GAD-7    [place] Office Visit from 04/26/2021 in [place] at [facility] Visit from 02/06/2020 in [place] at [facility] Visit from 10/27/2019 in [place] at [facility] Visit from 11/25/2017 in [place] at [facility]  Total GAD-7 Score 19 8 18 17       [place]    [place] from 10/21/2021 in [place]  [facility] Office Visit from 08/01/2021 in [facility] Office Visit from 04/26/2021 in [place] at [facility] Visit from 10/27/2019 in [place] at [facility] Visit from 11/25/2017 in [place] at [facility]  PHQ-2 Total Score 5 4 6 2 3   PHQ-9 Total Score 20 9 18 10 8       Flowsheet Row Counselor from 10/21/2021 in [place] Office Visit from 08/01/2021 in [facility]  C-SSRS RISK CATEGORY Error: Question 6 not populated Error: Q3, 4, or 5 should not be populated when Q2 is No       Assessment and Plan: Patient is a 31 year old female with past psychiatric history of MDD, GAD present to IOP program for worsening depression and anxiety.  Patient follows Dr. Adele Diaz at [facility] outpatient clinic at [facility].  Patient started IOP program on 11/04/2021.  MDD, moderate, recurrent -Continue group therapy and IOP program -Continue Lamictal 100 mg daily for mood stabilization. -Continue Klonopin 0.5 mg daily as needed for anxiety. -Patient does not want to start any other medications at this time.  Collaboration of Care: Psychiatrist AEB   and Other IOP Team  Patient/Guardian was advised Release of Information must be obtained prior to any record release in order  to collaborate their care with an outside provider. Patient/Guardian was advised if they have not already done so to contact the registration department to sign all necessary forms in order for [place] to release information regarding their care.   Consent: Patient/Guardian gives verbal consent for treatment and assignment of benefits for services provided during this visit. Patient/Guardian expressed understanding and agreed to proceed.   Crystal Reichert, MD 10/23/202312:04 PM

## 2021-11-04 NOTE — Progress Notes (Signed)
Virtual Visit via Video Note   I connected with [name] on 11/04/21 at  9:00 AM EDT by a video enabled telemedicine application and verified that I am speaking with the correct person using two identifiers.   At orientation to the IOP program, Case Manager discussed the limitations of evaluation and management by telemedicine and the availability of in person appointments. The patient expressed understanding and agreed to proceed with virtual visits throughout the duration of the program.   Location:  Patient: Patient Home Provider: Clinical Home Office   History of Present Illness: MDD with anxious distress   Observations/Objective: Check In: Case Manager checked in with all participants to review discharge dates, insurance authorizations, work-related documents and needs from the treatment team regarding medications. Fayne stated needs and engaged in discussion.    Initial Therapeutic Activity: Counselor facilitated a check-in with Tashari to assess for safety, sobriety and medication compliance.  Counselor also inquired about Michalene's current emotional ratings, as well as any significant changes in thoughts, feelings or behavior since previous check in.  Genoveva presented for session on time and was alert, oriented x5, with no evidence or self-report of active SI/HI or A/V H.  Milton reported compliance with medication and denied use of alcohol or illicit substances.  Quandra reported scores of 8/10 for depression, 10/10 for anxiety, and 8/10 for anger/irritability.  Colena denied any recent panic attacks.  Melisia reported that a recent success was making the decision to reach out for help and begin group therapy.  Raynell reported that a recent struggle has been noticing more outbursts and mood irritability related to stress.  Sharonlee reported that her goal today is to take time after work to rest.    Second Therapeutic Activity: Counselor introduced topic of self-care today.   Counselor explained how this can be defined as the things one does to maintain good health and improve well-being.  Counselor provided members with a self-care assessment form to complete.  This handout featured various sub-categories of self-care, including physical, psychological/emotional, social, spiritual, and professional.  Members were asked to rank their engagement in the activities listed for each dimension on a scale of 1-3, with 1 indicating 'Poor', 2 indicating '[place]', and 3 indicating 'Well'.  Counselor invited members to share results of their assessment, and inquired about which areas of self-care they are doing well in, as well as areas that require attention, and how they plan to begin addressing this during treatment.  Intervention was effective, as evidenced by Edwin Dada successfully completing initial 2 sections of assessment and actively engaging in discussion on subject, reporting that she is excelling in areas such as going to preventative medical appointments, resting when sick, going on vacations, but would benefit from focusing more on areas such as exercising, eating healthier foods, getting enough sleep, recognizing her own strengths and achievements, and doing comforting things.  Angelamarie reported that she would work to improve self-care deficits by implementing sleep hygiene techniques to improve overall rest, getting back into a regular gym routine to increase physical activity, taking time each week to take a long bubble bath or watch a favorite movie, recognizing her spirituality as a strength to help cope with future challenges, and prioritizing time for individual therapy.    Assessment and Plan: Counselor recommends that Lolamae remain in IOP treatment to better manage mental health symptoms, ensure stability and pursue completion of treatment plan goals. Counselor recommends adherence to crisis/safety plan, taking medications as prescribed, and following up with medical  professionals if any issues arise.   Follow Up Instructions: Counselor will send Webex link for next session. Aalia was advised to call back or seek an in-person evaluation if the symptoms worsen or if the condition fails to improve as anticipated.   Collaboration of Care:   Medication Management AEB Dr. Karsten Ro or Hillery Jacks, NP                                          Case Manager AEB Jeri Modena, CNA    Patient/Guardian was advised Release of Information must be obtained prior to any record release in order to collaborate their care with an outside provider. Patient/Guardian was advised if they have not already done so to contact the registration department to sign all necessary forms in order for [place] to release information regarding their care.   Consent: Patient/Guardian gives verbal consent for treatment and assignment of benefits for services provided during this visit. Patient/Guardian expressed understanding and agreed to proceed.  I provided 180 minutes of non-face-to-face time during this encounter.   Noralee Stain, [place], LCAS 11/04/21

## 2021-11-04 NOTE — Plan of Care (Signed)
Pt is an active participant in groups.

## 2021-11-05 ENCOUNTER — Other Ambulatory Visit (HOSPITAL_COMMUNITY)
Payer: No Typology Code available for payment source | Attending: Psychiatry | Admitting: Licensed Clinical Social Worker

## 2021-11-05 DIAGNOSIS — R454 Irritability and anger: Secondary | ICD-10-CM | POA: Insufficient documentation

## 2021-11-05 DIAGNOSIS — F419 Anxiety disorder, unspecified: Secondary | ICD-10-CM | POA: Insufficient documentation

## 2021-11-05 DIAGNOSIS — F339 Major depressive disorder, recurrent, unspecified: Secondary | ICD-10-CM

## 2021-11-05 DIAGNOSIS — F329 Major depressive disorder, single episode, unspecified: Secondary | ICD-10-CM | POA: Insufficient documentation

## 2021-11-05 NOTE — Progress Notes (Signed)
Virtual Visit via Video Note   I connected with [name] on 11/05/21 at  9:00 AM EDT by a video enabled telemedicine application and verified that I am speaking with the correct person using two identifiers.   At orientation to the IOP program, Case Manager discussed the limitations of evaluation and management by telemedicine and the availability of in person appointments. The patient expressed understanding and agreed to proceed with virtual visits throughout the duration of the program.   Location:  Patient: Patient Home Provider: Clinical Home Office   History of Present Illness: MDD with anxious distress   Observations/Objective: Check In: Case Manager checked in with all participants to review discharge dates, insurance authorizations, work-related documents and needs from the treatment team regarding medications. Leylany stated needs and engaged in discussion.    Initial Therapeutic Activity: Counselor facilitated a check-in with Tamiya to assess for safety, sobriety and medication compliance.  Counselor also inquired about Calaya's current emotional ratings, as well as any significant changes in thoughts, feelings or behavior since previous check in.  Latiqua presented for session on time and was alert, oriented x5, with no evidence or self-report of active SI/HI or A/V H.  Paola reported compliance with medication and denied use of alcohol or illicit substances.  Mahrukh reported scores of 6/10 for depression, 9/10 for anxiety, and 10/10 for anger/irritability.  Danyal denied any recent panic attacks.  Jasmaine reported that a recent success was going out to eat with family yesterday.  Anedra reported that a recent struggle was having an outburst towards her son this morning, stating "He has a hard time listening".  Katiya reported that her goal today is to work on getting a passport for her son, stating "That will be a good distraction".         Second Therapeutic Activity:  Counselor introduced [employer], [place] Chaplain to provide psychoeducation on topic of Grief and Loss with members today.  Estill Bamberg began discussion by checking in with the group about their baseline mood today, general thoughts on what grief means to them and how it has affected them personally in the past.  Estill Bamberg provided information on how the process of grief/loss can differ depending upon one's unique culture, and categories of loss one could experience (i.e. loss of a person, animal, relationship, job, identity, etc).  Estill Bamberg encouraged members to be mindful of how pervasive loss can be, and how to recognize signs which could indicate that this is having an impact on one's overall mental health and wellbeing.  Intervention was effective, as evidenced by [employer] participating in discussion with speaker on the subject, reporting that the loss of her mother due to COVID was very difficult, and she has attempted grief counseling, with mixed success. Kiandria stated "Grief to me is unbearable. I didn't experience it growing up, so I don't know how to deal with it now.  Sometimes it feels like the heaviest train weighing on my chest.  It still makes me feel like I'm in a state of shock, like its too much to deal with".     Third Therapeutic Activity: Counselor provided demonstration of relaxation technique known as mindful breathing to help members increase sense of calm, resiliency, and control.  Counselor guided members through process of getting comfortable, achieving a relaxed breathing rhythm, and focusing on this for several minutes, allowing troubling thoughts and feelings to come and go without rumination.  Counselor processed effectiveness of activity afterward in discussion with members, including how  this impacted their mental state, whether it was difficult to stay focused, and if they plan to include it in self-care routine to improve day-to-day coping.  Intervention was effective, as evidenced  by [employer] participating in exercise, and reporting that she enjoyed the activity and would consider practicing it for self-care.    Assessment and Plan: Counselor recommends that Leighla remain in IOP treatment to better manage mental health symptoms, ensure stability and pursue completion of treatment plan goals. Counselor recommends adherence to crisis/safety plan, taking medications as prescribed, and following up with medical professionals if any issues arise.   Follow Up Instructions: Counselor will send Webex link for next session. Libi was advised to call back or seek an in-person evaluation if the symptoms worsen or if the condition fails to improve as anticipated.   Collaboration of Care:   Medication Management AEB Dr. Karsten Ro or Hillery Jacks, NP                                          Case Manager AEB Jeri Modena, CNA    Patient/Guardian was advised Release of Information must be obtained prior to any record release in order to collaborate their care with an outside provider. Patient/Guardian was advised if they have not already done so to contact the registration department to sign all necessary forms in order for [place] to release information regarding their care.   Consent: Patient/Guardian gives verbal consent for treatment and assignment of benefits for services provided during this visit. Patient/Guardian expressed understanding and agreed to proceed.  I provided 180 minutes of non-face-to-face time during this encounter.   Noralee Stain, LCSW, LCAS 11/05/21

## 2021-11-06 ENCOUNTER — Other Ambulatory Visit (HOSPITAL_COMMUNITY)
Payer: No Typology Code available for payment source | Attending: Psychiatry | Admitting: Licensed Clinical Social Worker

## 2021-11-06 DIAGNOSIS — F339 Major depressive disorder, recurrent, unspecified: Secondary | ICD-10-CM

## 2021-11-06 DIAGNOSIS — F329 Major depressive disorder, single episode, unspecified: Secondary | ICD-10-CM | POA: Diagnosis not present

## 2021-11-06 NOTE — Progress Notes (Signed)
Virtual Visit via Video Note   I connected with Crystal Diaz on 11/06/21 at  9:00 AM EDT by a video enabled telemedicine application and verified that I am speaking with the correct person using two identifiers.   At orientation to the IOP program, Case Manager discussed the limitations of evaluation and management by telemedicine and the availability of in person appointments. The patient expressed understanding and agreed to proceed with virtual visits throughout the duration of the program.   Location:  Patient: Patient Home Provider: [facility]   History of Present Illness: MDD with anxious distress   Observations/Objective: Check In: Case Manager checked in with all participants to review discharge dates, insurance authorizations, work-related documents and needs from the treatment team regarding medications. Crystal Diaz stated needs and engaged in discussion.    Initial Therapeutic Activity: Counselor facilitated a check-in with Crystal Diaz to assess for safety, sobriety and medication compliance.  Counselor also inquired about Crystal Diaz's current emotional ratings, as well as any significant changes in thoughts, feelings or behavior since previous check in.  Crystal Diaz presented for session on time and was alert, oriented x5, with no evidence or self-report of active SI/HI or A/V H.  Crystal Diaz reported compliance with medication and denied use of alcohol or illicit substances.  Crystal Diaz reported scores of 6/10 for depression, 8/10 for anxiety, and 10/10 for irritability.  Crystal Diaz denied any recent outbursts or panic attacks.  Crystal Diaz reported that a recent success was helping her son get a passport yesterday.  Crystal Diaz reported that a recent struggle has been holding in strong emotions, but she was able to vocalize these in group today, and receptive to supportive feedback from peers and counselor.   Crystal Diaz reported that her goal today is to be productive by getting out of the house and running some  errands.     Second Therapeutic Activity: Counselor introduced topic of anger management today.  Counselor virtually shared a handout with members on this subject featuring a variety of coping skills, and facilitated discussion on these approaches.  Examples included raising awareness of anger triggers, practicing deep breathing, keeping an anger log to better understand episodes, using diversion activities to distract oneself for 30 minutes, taking a time out when necessary, and being mindful of warning signs tied to thoughts or behavior.  Counselor inquired about which techniques group members have used before, what has proved to be helpful, what their unique warning signs might be, as well as what they will try out in the future to assist with de-escalation.  Intervention was effective, as evidenced by Crystal Diaz participating in discussion on activity, and reporting that her triggers include feeling like her time is wasted, making mistakes or errors, having her child misbehave, experiencing conflict at work, or being taken advantage of by others.  Crystal Diaz reported that warning signs include racing heartbeat, shakiness, and body heat rising.  Crystal Diaz reported that she will work to manage anger more effectively by using coping skills such as keeping an anger journal to document outbursts, and meditation.  Assessment and Plan: Counselor recommends that Crystal Diaz remain in IOP treatment to better manage mental health symptoms, ensure stability and pursue completion of treatment plan goals. Counselor recommends adherence to crisis/safety plan, taking medications as prescribed, and following up with medical professionals if any issues arise.   Follow Up Instructions: Counselor will send Webex link for next session. Crystal Diaz was advised to call back or seek an in-person evaluation if the symptoms worsen or if the condition fails to  improve as anticipated.   Collaboration of Care:   Medication Management AEB Dr.  Armando Reichert or Ricky Ala, NP                                          Case Manager AEB Dellia Nims, CNA    Patient/Guardian was advised Release of Information must be obtained prior to any record release in order to collaborate their care with an outside provider. Patient/Guardian was advised if they have not already done so to contact the registration department to sign all necessary forms in order for [place] to release information regarding their care.   Consent: Patient/Guardian gives verbal consent for treatment and assignment of benefits for services provided during this visit. Patient/Guardian expressed understanding and agreed to proceed.  I provided 180 minutes of non-face-to-face time during this encounter.   Crystal Flood, LCSW, LCAS 11/06/21

## 2021-11-07 ENCOUNTER — Other Ambulatory Visit (HOSPITAL_COMMUNITY): Payer: No Typology Code available for payment source | Attending: Psychiatry | Admitting: Psychiatry

## 2021-11-07 ENCOUNTER — Other Ambulatory Visit: Payer: Self-pay

## 2021-11-07 DIAGNOSIS — Z79899 Other long term (current) drug therapy: Secondary | ICD-10-CM | POA: Insufficient documentation

## 2021-11-07 DIAGNOSIS — F339 Major depressive disorder, recurrent, unspecified: Secondary | ICD-10-CM

## 2021-11-07 DIAGNOSIS — F419 Anxiety disorder, unspecified: Secondary | ICD-10-CM | POA: Diagnosis not present

## 2021-11-07 DIAGNOSIS — F418 Other specified anxiety disorders: Secondary | ICD-10-CM | POA: Diagnosis not present

## 2021-11-07 DIAGNOSIS — F331 Major depressive disorder, recurrent, moderate: Secondary | ICD-10-CM | POA: Diagnosis not present

## 2021-11-07 MED ORDER — HYDROXYZINE HCL 25 MG PO TABS
25.0000 mg | ORAL_TABLET | Freq: Three times a day (TID) | ORAL | 0 refills | Status: DC | PRN
  Filled 2021-11-07: qty 90, 30d supply, fill #0

## 2021-11-07 MED ORDER — LAMOTRIGINE 150 MG PO TABS
150.0000 mg | ORAL_TABLET | Freq: Every day | ORAL | 0 refills | Status: DC
  Filled 2021-11-07: qty 30, 30d supply, fill #0

## 2021-11-07 NOTE — Addendum Note (Signed)
Addended byArmando Reichert on: 11/07/2021 03:25 PM   Modules accepted: Level of Service

## 2021-11-07 NOTE — Progress Notes (Signed)
Virtual Visit via Video Note   I connected with Crystal Diaz on 11/07/21 at  9:00 AM EDT by a video enabled telemedicine application and verified that I am speaking with the correct person using two identifiers.   At orientation to the IOP program, Case Manager discussed the limitations of evaluation and management by telemedicine and the availability of in person appointments. The patient expressed understanding and agreed to proceed with virtual visits throughout the duration of the program.   Location:  Patient: Patient Home Provider: [facility]   History of Present Illness: MDD with anxious distress   Observations/Objective: Check In: Case Manager checked in with all participants to review discharge dates, insurance authorizations, work-related documents and needs from the treatment team regarding medications. Crystal Diaz stated needs and engaged in discussion.    Initial Therapeutic Activity: Counselor facilitated a check-in with Crystal Diaz to assess for safety, sobriety and medication compliance.  Counselor also inquired about Crystal Diaz's current emotional ratings, as well as any significant changes in thoughts, feelings or behavior since previous check in.  Crystal Diaz presented for session on time and was alert, oriented x5, with no evidence or self-report of active SI/HI or A/V H.  Crystal Diaz reported compliance with medication and denied use of alcohol or illicit substances.  Crystal Diaz reported scores of 6/10 for depression, 10/10 for anxiety, and 8/10 for anger/irritability.  Crystal Diaz denied any recent outbursts or panic attacks.  Crystal Diaz reported that a recent success was taking a drive yesterday to relax, which lifted her spirits.  Crystal Diaz reported that a recent struggle was experiencing passive SI without intent or plan last night, although Crystal Diaz was able to allow herself time to cry, and pray to her higher power until these passed.  Crystal Diaz denied experiencing SI today, and can contract for safety  should SI return with intent/plan to harm herself.  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         Second Therapeutic Activity: Counselor introduced topic of 'urge surfing' today.  Counselor explained how this technique can be used to avoid acting upon a behavior that needs to be reduced or stopped completely.  Counselor provided common examples of maladaptive behaviors, such as smoking, overeating, substance use, excessive spending, lashing out emotionally, and more.  Counselor explained how urges rarely last more than 30 minutes if they are not ruminated upon, and attempts to fight or suppress the urge can ultimately cause the problem to grow.  Counselor guided members through practice of combination mindfulness, relaxation, and visualization exercises in order to "surf" an urge of their concern until it faded.  Intervention was effective, as evidenced by Crystal Diaz successfully participating in urge surfing activity and stating "It made me feel relaxed at the end, but the deep breathing made my head hurt a little bit.  I liked the part where you mentioned standing on the beach. I love the beach so that wasn't hard to picture".  Crystal Diaz reported that with practice it could help manage the urge to lash out physically when someone triggers her.       Assessment and Plan: Counselor recommends that Crystal Diaz remain in IOP treatment to better manage mental health symptoms, ensure stability and pursue completion of treatment plan goals. Counselor recommends adherence to crisis/safety plan, taking medications as prescribed, and following up with medical professionals if any issues arise.   Follow Up Instructions: Counselor will send Webex link for next  session. Crystal Diaz was advised to call back or seek an in-person evaluation if the symptoms worsen or if the condition fails to improve as anticipated.   Collaboration of Care:   Medication  Management AEB Dr. Armando Reichert or Ricky Ala, NP                                          Case Manager AEB Dellia Nims, CNA    Patient/Guardian was advised Release of Information must be obtained prior to any record release in order to collaborate their care with an outside provider. Patient/Guardian was advised if they have not already done so to contact the registration department to sign all necessary forms in order for [place] to release information regarding their care.   Consent: Patient/Guardian gives verbal consent for treatment and assignment of benefits for services provided during this visit. Patient/Guardian expressed understanding and agreed to proceed.  I provided 180 minutes of non-face-to-face time during this encounter.   Shade Flood, LCSW, LCAS 11/07/21

## 2021-11-07 NOTE — Progress Notes (Signed)
BH MD/PA/NP IOP Progress Note  11/07/2021 2:34 PM SIYONA COTO  MRN:  [id number] Virtual Visit via Video Note  I connected with [name] on 11/07/21 at  9:00 AM EDT by a video enabled telemedicine application and verified that I am speaking with the correct person using two identifiers.  Location: Patient: Home Provider: Clinic   I discussed the limitations of evaluation and management by telemedicine and the availability of in person appointments. The patient expressed understanding and agreed to proceed.   I discussed the assessment and treatment plan with the patient. The patient was provided an opportunity to ask questions and all were answered. The patient agreed with the plan and demonstrated an understanding of the instructions.   The patient was advised to call back or seek an in-person evaluation if the symptoms worsen or if the condition fails to improve as anticipated.  I provided 10 minutes of non-face-to-face time during this encounter.   Karsten Ro, MD  Chief Complaint:  Chief Complaint  Patient presents with   Depression   Anxiety   Follow-up   HPI: Patient is a 31 year old female with past psychiatric history of MDD, GAD present to IOP program for worsening depression and anxiety.  Patient follows Dr. Lolly Mustache at [facility] outpatient clinic at [facility].  Patient started IOP program on 11/04/2021.  Pt reports that her mood is " anxious". She reports that she has a lot of irritability and feels anxious especially in the morning.  She reports that she has been taking Lamictal for over 1 month.  She reports that she is still not sleeping well.  She is still reporting low appetite. Currently, she denies any active suicidal ideations, homicidal ideations, auditory and visual hallucinations.  She reports passive SI with no plan or intent, contracts for safety at this time.  Safety planning done with patient.  She denies any medication side effects and has been tolerating  it well.  Discussed different options including increasing Lamictal or adding mirtazapine.  Discussed risks and benefits.  Patient states she does not want to gain weight so does not want to start mirtazapine.  She is amenable to increasing Lamictal and adding hydroxyzine to help with sleep. She reports no change in her current stressors.  Patient is alert and oriented x 4, anxious, cooperative, and fully engaged in conversation during the encounter.  Her thought process is linear with coherent speech . She does not appear to be responding to internal/external stimuli .      Visit Diagnosis:    ICD-10-CM   1. Major depressive disorder, recurrent episode with anxious distress (HCC)  F33.9 hydrOXYzine (ATARAX) 25 MG tablet    2. MDD (major depressive disorder), recurrent episode, moderate (HCC)  F33.1 lamoTRIgine (LAMICTAL) 150 MG tablet    3. Anxiety  F41.9 lamoTRIgine (LAMICTAL) 150 MG tablet      Past Psychiatric History: Previous Psych Diagnoses: MDD, GAD Prior inpatient treatment: Denies Current meds: Lamictal 100 mg daily, Klonopin 0.5 mg daily as needed for anxiety Psychotherapy hx: Getting therapy from Maggie Font Previous suicidal attempts: Denies Previous medication trials: Multiple, Zoloft, Effexor, Lexapro which were not effective Current therapist: Maggie Font    Past Medical History:  Past Medical History:  Diagnosis Date   Anemia    Anxiety    GERD (gastroesophageal reflux disease)    Headache    Lumbar radiculopathy    PCOS (polycystic ovarian syndrome)    Plantar fasciitis, bilateral     Past  Surgical History:  Procedure Laterality Date   FOOT SURGERY Right    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   Procedure: LAPAROSCOPIC ROUX-EN-Y GASTRIC BYPASS WITH HIATAL HERNIA REPAIR AND UPPER ENDOSCOPY;  Surgeon: Johnathan Hausen, MD;  Location: [facility];  Service: General;  Laterality: N/A;   WISDOM TOOTH EXTRACTION      Family Psychiatric History: Psych:  Brother-schizophrenia SA/HA: Denies    Family History:  Family History  Problem Relation Age of Onset   Diabetes Mother    Pancreatic cancer Father 20   Cancer Brother        Oral   Mental illness Brother     Social History:  Social History   Socioeconomic History   Marital status: Single    Spouse name: Not on file   Number of children: Not on file   Years of education: Not on file   Highest education level: Not on file  Occupational History   Not on file  Tobacco Use   Smoking status: Former    Packs/day: 0.25    Years: 4.00    Total pack years: 1.00    Types: Cigarettes    Quit date: 01/19/2017    Years since quitting: 4.8   Smokeless tobacco: Never  Vaping Use   Vaping Use: Never used  Substance and Sexual Activity   Alcohol use: Yes    Alcohol/week: 0.0 standard drinks of alcohol    Comment: occ   Drug use: No   Sexual activity: Yes    Partners: Male    Birth control/protection: Pill  Other Topics Concern   Not on file  Social History Narrative   Single. In a relationship.    One son.   Works as Biomedical engineer.    Enjoys spending time with her son, traveling.   Social Determinants of Health   Financial Resource Strain: Not on file  Food Insecurity: Not on file  Transportation Needs: Not on file  Physical Activity: Not on file  Stress: Not on file  Social Connections: Not on file    Allergies: No Known Allergies  Metabolic Disorder Labs: No results found for: "HGBA1C", "MPG" No results found for: "PROLACTIN" Lab Results  Component Value Date   CHOL 129 09/06/2021   TRIG 65.0 09/06/2021   HDL 54.40 09/06/2021   CHOLHDL 2 09/06/2021   VLDL 13.0 09/06/2021   LDLCALC 62 09/06/2021   LDLCALC 66 06/08/2020   Lab Results  Component Value Date   TSH 1.08 09/06/2021   TSH 0.47 02/16/2019    Therapeutic Level Labs: No results found for: "LITHIUM" No results found for: "VALPROATE" No results found for: "CBMZ"  Current  Medications: Current Outpatient Medications  Medication Sig Dispense Refill   hydrOXYzine (ATARAX) 25 MG tablet Take 1 tablet (25 mg total) by mouth 3 (three) times daily as needed for anxiety (insomnia). 90 tablet 0   celecoxib (CELEBREX) 100 MG capsule Take 1 capsule (100 mg total) by mouth daily. 30 capsule 0   clonazePAM (KLONOPIN) 0.5 MG tablet Take 1 tablet (0.5 mg total) by mouth daily as needed for anxiety. 15 tablet 0   cyanocobalamin (VITAMIN B12) 1000 MCG/ML injection Inject 1 mL (1,000 mcg total) into the muscle every 30 (thirty) days. 3 mL 0   cyclobenzaprine (FLEXERIL) 5 MG tablet Take 1 tablet (5 mg total) by mouth 2 (two) times daily as needed for muscle spasms. 20 tablet 0   desogestrel-ethinyl estradiol (MIRCETTE) 0.15-0.02/0.01 MG (21/5) tablet Take 1 tablet by mouth daily. [place]  tablet 2   diclofenac Sodium (VOLTAREN ARTHRITIS PAIN) 1 % GEL APPLY 2 GRAMS TO THE AFFECTED AREA(S) BY TOPICAL ROUTE 4 TIMES PER DAY 100 g 0   lamoTRIgine (LAMICTAL) 150 MG tablet Take 1 tablet (150 mg total) by mouth daily. 30 tablet 0   methylPREDNISolone (MEDROL) 4 MG TBPK tablet Take 1 dose pk by oral route. 21 tablet 0   pantoprazole (PROTONIX) 20 MG tablet Take 1 tablet (20 mg total) by mouth daily. For heartburn 90 tablet 0   propranolol ER (INDERAL LA) 120 MG 24 hr capsule Take 1 capsule (120 mg total) by mouth at bedtime. For headache prevention 30 capsule 0   SYRINGE/NEEDLE, DISP, 1 ML 23G X 1" 1 ML MISC Use as directed 3 each 0   Vitamin D, Ergocalciferol, (DRISDOL) 1.25 MG (50000 UNIT) CAPS capsule Take 1 capsule by mouth once weekly for 12 weeks. 12 capsule 0   No current facility-administered medications for this visit.     Musculoskeletal: Strength & Muscle Tone: Unable to assess due to virtual visit Gait & Station: Unable to assess due to virtual visit Patient leans: N/A  Psychiatric Specialty Exam: Review of Systems  There were no vitals taken for this visit.There is no height or  weight on file to calculate BMI.  General Appearance: Casual  Eye Contact:  Fair  Speech:  Clear and Coherent and Normal Rate  Volume:  Normal  Mood:  Anxious and Dysphoric  Affect:  Depressed  Thought Process:  Coherent  Orientation:  Full (Time, Place, and Person)  Thought Content: Logical   Suicidal Thoughts:  No, passive SI.  Without plan or intent, contracts for safety at this time.  Homicidal Thoughts:  No  Memory:  Immediate;   Good Recent;   Good  Judgement:  Good  Insight:  Good  Psychomotor Activity:  Normal  Concentration:  Concentration: Good and Attention Span: Good  Recall:  Good  Fund of Knowledge: Good  Language: Good  Akathisia:  No  Handed:  Right  AIMS (if indicated): not done  Assets:  Communication Skills Desire for Improvement Financial Resources/Insurance Housing  ADL's:  Intact  Cognition: WNL  Sleep:  Fair   Screenings: GAD-7    Flowsheet Row Office Visit from 04/26/2021 in [facility] Visit from 02/06/2020 in [facility] Visit from 10/27/2019 in [facility] Visit from 11/25/2017 in [facility]  Total GAD-7 Score 19 8 18 17       PHQ2-9    Flowsheet Row Counselor from 10/21[facility]2021 [facility] from [facility] Visit from 04[facility]26/2021 in [facility] Visit from 10/27/2019 in [facility] Visit from 11/25/2017 in [facility]  PHQ-2 Total Score 5 4 6 2 3   PHQ-9 Total Score 20 9 18 10 8       Flowsheet Row Counselor from 10/21/2021 in [facility] Office Visit from 08/01/2021 in [facility]  C-SSRS RISK CATEGORY Error: Question 6 not populated Error: Q3, 4, or 5 should not be populated when Q2 is No       Assessment and Plan  Patient is a 31 year old female with past psychiatric history of MDD, GAD  presents to IOP program for worsening depression and anxiety.  Patient follows Dr. 12/21/2021 at [facility] outpatient clinic at  [place].  Patient started IOP program on 11/04/2021.  Patient is still complaining of high anxiety, irritability and difficulty in sleep.  Will increase Lamictal for mood stabilization and add hydroxyzine to help with sleep and anxiety.   MDD, moderate, recurrent -Continue group therapy and IOP program -Increase Lamictal to 150 mg daily for mood stabilization. -Start hydroxyzine 25 mg 3 times daily as needed for anxiety and sleep -Continue Klonopin 0.5 mg daily as needed for anxiety. -Patient does not want to start any other medications at this time.   Collaboration of Care: Other IOP Team  Patient/Guardian was advised Release of Information must be obtained prior to any record release in order to collaborate their care with an outside provider. Patient/Guardian was advised if they have not already done so to contact the registration department to sign all necessary forms in order for [place] to release information regarding their care.   Consent: Patient/Guardian gives verbal consent for treatment and assignment of benefits for services provided during this visit. Patient/Guardian expressed understanding and agreed to proceed.    Karsten Ro, MD 11/07/2021, 2:34 PM

## 2021-11-08 ENCOUNTER — Other Ambulatory Visit (HOSPITAL_COMMUNITY): Payer: No Typology Code available for payment source | Admitting: Licensed Clinical Social Worker

## 2021-11-08 ENCOUNTER — Other Ambulatory Visit: Payer: Self-pay

## 2021-11-08 ENCOUNTER — Telehealth: Payer: No Typology Code available for payment source | Admitting: Physician Assistant

## 2021-11-08 DIAGNOSIS — J208 Acute bronchitis due to other specified organisms: Secondary | ICD-10-CM

## 2021-11-08 DIAGNOSIS — F339 Major depressive disorder, recurrent, unspecified: Secondary | ICD-10-CM | POA: Diagnosis not present

## 2021-11-08 DIAGNOSIS — B9689 Other specified bacterial agents as the cause of diseases classified elsewhere: Secondary | ICD-10-CM | POA: Diagnosis not present

## 2021-11-08 MED ORDER — BENZONATATE 100 MG PO CAPS
100.0000 mg | ORAL_CAPSULE | Freq: Three times a day (TID) | ORAL | 0 refills | Status: DC | PRN
  Filled 2021-11-08: qty 30, 10d supply, fill #0

## 2021-11-08 MED ORDER — ALBUTEROL SULFATE HFA 108 (90 BASE) MCG/ACT IN AERS
1.0000 | INHALATION_SPRAY | Freq: Four times a day (QID) | RESPIRATORY_TRACT | 0 refills | Status: DC | PRN
  Filled 2021-11-08: qty 6.7, 25d supply, fill #0

## 2021-11-08 MED ORDER — DOXYCYCLINE HYCLATE 100 MG PO TABS
100.0000 mg | ORAL_TABLET | Freq: Two times a day (BID) | ORAL | 0 refills | Status: DC
  Filled 2021-11-08: qty 20, 10d supply, fill #0

## 2021-11-08 NOTE — Progress Notes (Signed)
We are sorry that you are not feeling well.  Here is how we plan to help!  Based on your presentation I believe you most likely have A cough due to bacteria.  When patients have a fever and a productive cough with a change in color or increased sputum production, we are concerned about bacterial bronchitis.  If left untreated it can progress to pneumonia.  If your symptoms do not improve with your treatment plan it is important that you contact your provider.   I have prescribed Doxycycline 100 mg twice a day for 7 days     In addition you may use A non-prescription cough medication called Mucinex DM: take 2 tablets every 12 hours. and A prescription cough medication called Tessalon Perles 100mg . You may take 1-2 capsules every 8 hours as needed for your cough.  Albuterol inhaler: Use 1-2 puffs every 6 hours as needed for shortness of breath and wheezing.   From your responses in the eVisit questionnaire you describe inflammation in the upper respiratory tract which is causing a significant cough.  This is commonly called Bronchitis and has four common causes:   Allergies Viral Infections Acid Reflux Bacterial Infection Allergies, viruses and acid reflux are treated by controlling symptoms or eliminating the cause. An example might be a cough caused by taking certain blood pressure medications. You stop the cough by changing the medication. Another example might be a cough caused by acid reflux. Controlling the reflux helps control the cough.  USE OF BRONCHODILATOR ("RESCUE") INHALERS: There is a risk from using your bronchodilator too frequently.  The risk is that over-reliance on a medication which only relaxes the muscles surrounding the breathing tubes can reduce the effectiveness of medications prescribed to reduce swelling and congestion of the tubes themselves.  Although you feel brief relief from the bronchodilator inhaler, your asthma may actually be worsening with the tubes becoming more  swollen and filled with mucus.  This can delay other crucial treatments, such as oral steroid medications. If you need to use a bronchodilator inhaler daily, several times per day, you should discuss this with your provider.  There are probably better treatments that could be used to keep your asthma under control.     HOME CARE Only take medications as instructed by your medical team. Complete the entire course of an antibiotic. Drink plenty of fluids and get plenty of rest. Avoid close contacts especially the very young and the elderly Cover your mouth if you cough or cough into your sleeve. Always remember to wash your hands A steam or ultrasonic humidifier can help congestion.   GET HELP RIGHT AWAY IF: You develop worsening fever. You become short of breath You cough up blood. Your symptoms persist after you have completed your treatment plan MAKE SURE YOU  Understand these instructions. Will watch your condition. Will get help right away if you are not doing well or get worse.    Thank you for choosing an e-visit.  Your e-visit answers were reviewed by a board certified advanced clinical practitioner to complete your personal care plan. Depending upon the condition, your plan could have included both over the counter or prescription medications.  Please review your pharmacy choice. Make sure the pharmacy is open so you can pick up prescription now. If there is a problem, you may contact your provider through and have the prescription routed to another pharmacy.  Your safety is important to [employer]. If you have drug allergies check your  prescription carefully.   For the next 24 hours you can use MyChart to ask questions about today's visit, request a non-urgent call back, or ask for a work or school excuse. You will get an email in the next two days asking about your experience. I hope that your e-visit has been valuable and will speed your recovery.  I have spent 5  minutes in review of e-visit questionnaire, review and updating patient chart, medical decision making and response to patient.   Mar Daring, PA-C

## 2021-11-08 NOTE — Progress Notes (Signed)
Virtual Visit via Video Note   I connected with [name] on 11/08/21 at  9:00 AM EDT by a video enabled telemedicine application and verified that I am speaking with the correct person using two identifiers.   At orientation to the IOP program, Case Manager discussed the limitations of evaluation and management by telemedicine and the availability of in person appointments. The patient expressed understanding and agreed to proceed with virtual visits throughout the duration of the program.   Location:  Patient: Patient Home Provider: Clinical Home Office   History of Present Illness: MDD with anxious distress   Observations/Objective: Check In: Case Manager checked in with all participants to review discharge dates, insurance authorizations, work-related documents and needs from the treatment team regarding medications. Cashlyn stated needs and engaged in discussion.    Initial Therapeutic Activity: Counselor facilitated a check-in with Aveline to assess for safety, sobriety and medication compliance.  Counselor also inquired about Kearie's current emotional ratings, as well as any significant changes in thoughts, feelings or behavior since previous check in.  Sarh presented for session on time and was alert, oriented x5, with no evidence or self-report of active SI/HI or A/V H.  Mekhia reported compliance with medication and denied use of alcohol or illicit substances.  Challis reported scores of 4/10 for depression, 8/10 for anxiety, and 8/10 for anger/irritability.  Anasofia denied any recent outbursts or panic attacks.  Esha reported that a recent success was starting a new medication to help address sleep issues.  Kobie reported that a recent struggle has been dealing with congestion lately, and feeling under the weather.  Breeonna reported that her goal this weekend is to spend time with her cousin and attend a Halloween party.         Second Therapeutic Activity: Counselor  introduced topic of building a social support network today.  Counselor explained how this can be defined as having a having a group of healthy people in one's life you can talk to, spend time with, and get help from to improve both mental and physical health.  Counselor noted that some barriers can make it difficult to connect with other people, including the presence of anxiety or depression, or moving to an unfamiliar area.  Group members were asked to assess the current state of their support network, and identify ways that this could be improved.  Tips were given on how to address previously noted barriers, such as strengthening social skills, using relaxation techniques to reduce anxiety, scheduling social time each week, and/or exploring social events nearby which could increase chances of meeting new supports.  Members were also encouraged to consider getting closer to people they already know through suggestions such as outreaching someone by text, email or phone call if they haven't spoken in awhile, doing something nice for a friend/family member unexpectedly, and/or inviting someone over for a game/movie/dinner night.  Intervention was effective, as evidenced by Raquel Sarna actively participating in discussion on the subject, and reporting that she has a few good key supports in her network that are offered by attendance to women's empowerment groups, participation in activities at a local shelter, and her local church.  She was receptive to suggestions on expanding support offered today, including exploring community support groups available through [facility], and getting involved in activities at the [facility], as she used to like swimming and could meet new friends there.    Assessment and Plan: Counselor recommends that Meztli remain in IOP treatment to better  manage mental health symptoms, ensure stability and pursue completion of treatment plan goals. Counselor recommends adherence to  crisis/safety plan, taking medications as prescribed, and following up with medical professionals if any issues arise.   Follow Up Instructions: Counselor will send Webex link for next session. Mysty was advised to call back or seek an in-person evaluation if the symptoms worsen or if the condition fails to improve as anticipated.   Collaboration of Care:   Medication Management AEB Dr. Armando Reichert or Ricky Ala, NP                                          Case Manager AEB Dellia Nims, CNA    Patient/Guardian was advised Release of Information must be obtained prior to any record release in order to collaborate their care with an outside provider. Patient/Guardian was advised if they have not already done so to contact the registration department to sign all necessary forms in order for [place] to release information regarding their care.   Consent: Patient/Guardian gives verbal consent for treatment and assignment of benefits for services provided during this visit. Patient/Guardian expressed understanding and agreed to proceed.  I provided 180 minutes of non-face-to-face time during this encounter.   Shade Flood, LCSW, LCAS 11/08/21

## 2021-11-11 ENCOUNTER — Other Ambulatory Visit (HOSPITAL_COMMUNITY): Payer: No Typology Code available for payment source | Attending: Psychiatry | Admitting: Psychiatry

## 2021-11-11 DIAGNOSIS — F339 Major depressive disorder, recurrent, unspecified: Secondary | ICD-10-CM | POA: Insufficient documentation

## 2021-11-11 NOTE — Progress Notes (Signed)
Virtual Visit via Video Note  I connected with Crystal Diaz on @TODAY @ at  9:00 AM EDT by a video enabled telemedicine application and verified that I am speaking with the correct person using two identifiers.  Location: Patient: at home Provider: at office   I discussed the limitations of evaluation and management by telemedicine and the availability of in person appointments. The patient expressed understanding and agreed to proceed.  I discussed the assessment and treatment plan with the patient. The patient was provided an opportunity to ask questions and all were answered. The patient agreed with the plan and demonstrated an understanding of the instructions.   The patient was advised to call back or seek an in-person evaluation if the symptoms worsen or if the condition fails to improve as anticipated.  I provided 30 minutes of non-face-to-face time during this encounter.   , M.Ed,CNA   Patient ID: Crystal Diaz, female   DOB: May 15, 1990, 31 y.o.   MRN: [id number] As previous CCA states:  This is a 31 yr old, single, employed, 32 American female who was referred per Dr. [place], treatment for worsening depressive/anxiety symptoms.  Pt admits to passive SI (denies a plan or intent).  Stressors:  1) Unresolved grief/loss issues:  05-24-2011 father died d/t stage 4 pancreatic cancer, 05-23-2013 MGM passed; May 24, 2014 MGF passed; 05-23-2017 childhood pet died; 2018-05-24 mother suddenly passed d/t COVID; 10-11-21 60 yr old great aunt passed.  According to pt, she lived with pt for awhile.  2) Relationship issues:  Pt has been with boyfriend for three yrs.  He resides with patient and her son.  According to pt, there has been some communicating between he and someone else.  Pt suspects he's having an affair.  3) 33 yo son is acting out since GM passed.  Pt states she has received 70 days from the school.  "He states he is being bullied but I suspect he's the bully also."  4) Younger brother:  He was dx'd  with Schizophrenia in 2015-05-24.  He is currently in prison d/t first degree murder.  Pt denies a hx of being admitted in a psychiatric hospital; denies a hx of suicide attempt or gestures.  Has seen Dr. 2018 for a couple of months; seen Lolly Mustache, LCSW since 05-24-18.  Pt attended seven days of MH-IOP.  Tomorrow is the day the case manager would be requesting for seven more days for pt, but pt is requesting to go ahead and discharge today.  "I'm in a better place and doing better."  Pt states she misses her job and the people.  On a scale of 1-10 (10 being the worst); pt rates her anxiety at a 6 and depression at a 3.  Denies SI/HI or A/V hallucinations.  A:  Discharge pt.  Pt was advised of ROI must be obtained prior to any records release in order to collaborate her care with an outside provider.  Pt was advised if she has not already done so to contact the front desk to sign all necessary forms in order for MH-IOP to release info re: her care. Consent:  Pt gives verbal consent for tx and assignment of benefits for services provided during this telehealth group process.  Pt expressed understanding and agreed to proceed. Collaboration of care:  Collaborate with Dr. 2021 AEB, Dr. Dewayne Shorter, Gerlene Burdock, LCSW AEB, and Noralee Stain, LCSW AEB.  Strongly encouraged [facility] support groups.   R:  Pt receptive.  Dellia Nims, M.Ed,CNA

## 2021-11-11 NOTE — Progress Notes (Signed)
Virtual Visit via Video Note   I connected with [name] on 11/11/21 at  9:00 AM EDT by a video enabled telemedicine application and verified that I am speaking with the correct person using two identifiers.   At orientation to the IOP program, Case Manager discussed the limitations of evaluation and management by telemedicine and the availability of in person appointments. The patient expressed understanding and agreed to proceed with virtual visits throughout the duration of the program.   Location:  Patient: Patient Home Provider: Clinical Home Office   History of Present Illness: MDD with anxious distress   Observations/Objective: Check In: Case Manager checked in with all participants to review discharge dates, insurance authorizations, work-related documents and needs from the treatment team regarding medications. Leslea stated needs and engaged in discussion.    Initial Therapeutic Activity: Counselor facilitated a check-in with Tyann to assess for safety, sobriety and medication compliance.  Counselor also inquired about Leiliana's current emotional ratings, as well as any significant changes in thoughts, feelings or behavior since previous check in.  Jamea presented for session on time and was alert, oriented x5, with no evidence or self-report of active SI/HI or A/V H.  Reginae reported compliance with medication and denied use of alcohol or illicit substances.  Caylie reported scores of 3/10 for depression, 6/10 for anxiety, and 0/10 for anger/irritability.  Nekesha denied any recent panic attacks.  Josefina reported that a recent success was doing some extensive cleaning in the house, having a cookout with her partner, and attending an event at a trampoline park over the weekend.  Kandra reported that a recent struggle was having an outburst when her child wasn't listening to her.  Vitalia reported that her goal today is to go ahead and discharge from MHIOP, since she is  ready for work.  Tayllor discussed pros and cons of this decision with case manager and counselor before committing to discharge.         Second Therapeutic Activity: Counselor offered to teach group members an ACT relaxation technique today to aid in managing difficult thoughts, feelings, urges, and sensations.  Counselor guided members through process of getting comfortable, achieving relaxing breathing rhythm, and then maintaining this throughout activity.  Counselor invited members to imagine a gently flowing stream in their mind with leaves floating upon it, and when any thoughts, feelings, urges, or sensations arose, good or bad, they were instructed to visualize placing them on these passing leaves over course of 10 minutes practice.  Intervention was effective, as evidenced by Raquel Sarna successfully participating in activity and reporting that she found it to be soothing, and expressed interest in practicing it more often to ensure adequate outlet for processing difficult emotions.    Third Therapeutic Activity: Counselor also acknowledged a graduating group member by prompting this member to reflect on progress made since beginning the MHIOP program, notable takeaways from sessions attended, challenges overcome, and plan for continued care following discharge. Counselor and group members shared observations of growth, words of encouragement and support as this member transitioned out of the program today.  Intervention was effective, as evidenced by Raquel Sarna engaging in discussion about her positive experience during MHIOP, including the coping skills that were taught, having a chance to open up honestly with supportive peers about her struggles, and feel understood without judgement.  She encouraged other members to stay consistent with therapy and wished them well.    Assessment and Plan: Yasmyn will be discharged from MHIOP today based upon  desire to return to work.  Counselor recommends adherence  to crisis/safety plan, taking medications as prescribed, and following up with medical professionals if any issues arise.   Follow Up Instructions: Kacey was advised to call back or seek an in-person evaluation if the symptoms worsen or if the condition fails to improve as anticipated.   Collaboration of Care:   Medication Management AEB Dr. Armando Reichert or Ricky Ala, NP                                          Case Manager AEB Dellia Nims, CNA    Patient/Guardian was advised Release of Information must be obtained prior to any record release in order to collaborate their care with an outside provider. Patient/Guardian was advised if they have not already done so to contact the registration department to sign all necessary forms in order for [place] to release information regarding their care.   Consent: Patient/Guardian gives verbal consent for treatment and assignment of benefits for services provided during this visit. Patient/Guardian expressed understanding and agreed to proceed.  I provided 180 minutes of non-face-to-face time during this encounter.   Shade Flood, LCSW, LCAS 11/11/21

## 2021-11-11 NOTE — Patient Instructions (Signed)
D:  Patient completed MH-IOP today, per her request.  A:  Discharge today.  Follow up with Dr. Adele Schilder on 11-18-21 @ 11:30 a.m (virtual) and Dennison Bulla, LCSW (patient will schedule).  Encouraged support groups through [facility].  Patient states she plans to return to work earlier than scheduled.  R:  Patient receptive.

## 2021-11-14 ENCOUNTER — Telehealth (HOSPITAL_COMMUNITY): Payer: Self-pay | Admitting: *Deleted

## 2021-11-14 ENCOUNTER — Other Ambulatory Visit: Payer: Self-pay

## 2021-11-14 DIAGNOSIS — F419 Anxiety disorder, unspecified: Secondary | ICD-10-CM

## 2021-11-14 MED ORDER — CLONAZEPAM 0.5 MG PO TABS
0.5000 mg | ORAL_TABLET | Freq: Every day | ORAL | 0 refills | Status: DC | PRN
  Filled 2021-11-14: qty 15, 15d supply, fill #0

## 2021-11-14 NOTE — Telephone Encounter (Signed)
Pt called requesting a refill of Klonopin 0.5 mg 1 qd prn. #15 was sent in on 10/18/21. Pt has an appointment upcoming on 11/18/21 and states that she's worried about making it through the weekend. Please review and advise.

## 2021-11-14 NOTE — Telephone Encounter (Signed)
Pt notified that script was sent to [facility] outpatient pharmacy. Pt asked when her next appointment is (11/18/21) and unfortunately has a conflicting appointment. Pt transferred to front desk to reschedule.

## 2021-11-14 NOTE — Telephone Encounter (Signed)
Done

## 2021-11-15 ENCOUNTER — Ambulatory Visit (INDEPENDENT_AMBULATORY_CARE_PROVIDER_SITE_OTHER): Payer: No Typology Code available for payment source | Admitting: Psychology

## 2021-11-15 DIAGNOSIS — F339 Major depressive disorder, recurrent, unspecified: Secondary | ICD-10-CM

## 2021-11-15 NOTE — Progress Notes (Signed)
[facility] Counselor/Therapist Progress Note  Patient ID: Crystal Diaz, MRN: [id number],    Date: 11/15/2021  Time Spent: 50 mins  Treatment Type: Individual Therapy  Reported Symptoms: Pt presented for a follow up session, via webex video.  Pt granted consent for the session, stating that she is in her home with no one else present.  I shared with pt that I am in my office with no one else here either.  Previous pt of Crystal Diaz; saw her for about 3 yrs.    Mental Status Exam: Appearance:  Casual     Behavior: Appropriate  Motor: Normal  Speech/Language:  Clear and Coherent  Affect: Appropriate  Mood: normal  Thought process: normal  Thought content:   WNL  Sensory/Perceptual disturbances:   WNL  Orientation: oriented to person, place, time/date, and situation  Attention: Good  Concentration: Good  Memory: WNL  Fund of knowledge:  Good  Insight:   Good  Judgment:  Good  Impulse Control: Good   Risk Assessment: Danger to Self:  No Self-injurious Behavior: No Danger to Others: No Duty to Warn:no Physical Aggression / Violence:No  Access to Firearms a concern: No  Gang Involvement:No   Subjective: Pt shares that she "has been OK since our last session.  Pt shares she just completed a week of IOP through [facility] and feels that experience was beneficial for her.  Her psychiatrist wrote her out of work for two weeks to help her get a handle back on her life and circumstances.  She is still taking her Lamictal and her Klonopin and got Hydroxyzine.  Pt shares that she was getting upset and angry about [name]'s legal situation.  Pt shares that [name] has been indicted for first degree murder; he has to get a Midwife due to this charge.  She has met with the paralegal for the new attorney and has been able to visit [name] in jail periodically; tries to go visit him on Friday afternoons.  His former attorney hired a Teacher, music who interviewed [name] and she  told pt that while he might have been part of the crime but was not likely responsible due to his mental illness.  His next court date is 11/23 and she is not sure what will happen at that time.  [name] is still not taking his medications while he is in jail now.  Pt shares that [name] has already been suspended for fighting and is talking too much in class and is not doing his school work; he continues to see his psychiatrist but pt does not see any changes in his behaviors.  He has an appt with a therapist at [facility] for help as well.  Pt has been to the school to observe [name]'s behavior during a couple of classes.  He is failing all of his classes at this point and pt wants to support him as much as possible.  She is going to work with the school to develop an IEP for [name]'s behavior.  She has gotten her nails done lately and participating in IOP was good for her as well.  Encouraged pt to continue with her self care activities and we will meet next week for a follow up session.  Interventions: Cognitive Behavioral Therapy  Diagnosis:Major depressive disorder, recurrent episode with anxious distress [facility])  Plan: Treatment Plan Strengths/Abilities:  Intelligent, Intuitive, Willing to participate in therapy Treatment Preferences:  Outpatient Individual Therapy Statement of Needs:  Patient is to use CBT, mindfulness  and coping skills to help manage and/or decrease symptoms associated with their diagnosis. Symptoms:  Depressed/Irritable mood, worry, social withdrawal Problems Addressed:  Depressive thoughts, Sadness, Sleep issues, etc. Long Term Goals:  Pt to reduce overall level, frequency, and intensity of the feelings of depression/anxiety as evidenced by decreased irritability, negative self talk, and helpless feelings from 6 to 7 days/week to 0 to 1 days/week, per client report, for at least 3 consecutive months.  Progress: 20% Short Term Goals:  Pt to verbally express understanding of the  relationship between feelings of depression/anxiety and their impact on thinking patterns and behaviors.  Pt to verbalize an understanding of the role that distorted thinking plays in creating fears, excessive worry, and ruminations.  Progress: 20% Target Date:  12/28/2021 Frequency:  Bi-weekly Modality:  Cognitive Behavioral Therapy Interventions by Therapist:  Therapist will use CBT, Mindfulness exercises, Coping skills and Referrals, as needed by client. Client has verbally approved this treatment plan.  Ivan Anchors, [facility]

## 2021-11-18 ENCOUNTER — Telehealth (HOSPITAL_COMMUNITY): Payer: No Typology Code available for payment source | Admitting: Psychiatry

## 2021-11-27 ENCOUNTER — Other Ambulatory Visit: Payer: Self-pay | Admitting: Primary Care

## 2021-11-27 DIAGNOSIS — K219 Gastro-esophageal reflux disease without esophagitis: Secondary | ICD-10-CM

## 2021-11-27 DIAGNOSIS — E559 Vitamin D deficiency, unspecified: Secondary | ICD-10-CM

## 2021-11-28 ENCOUNTER — Other Ambulatory Visit (HOSPITAL_COMMUNITY): Payer: Self-pay

## 2021-11-28 MED ORDER — VITAMIN D (ERGOCALCIFEROL) 1.25 MG (50000 UNIT) PO CAPS
50000.0000 [IU] | ORAL_CAPSULE | ORAL | 0 refills | Status: AC
  Filled 2021-11-28 – 2021-12-26 (×3): qty 12, 84d supply, fill #0

## 2021-11-29 ENCOUNTER — Ambulatory Visit (INDEPENDENT_AMBULATORY_CARE_PROVIDER_SITE_OTHER): Payer: No Typology Code available for payment source | Admitting: Psychology

## 2021-11-29 DIAGNOSIS — F339 Major depressive disorder, recurrent, unspecified: Secondary | ICD-10-CM

## 2021-11-29 NOTE — Progress Notes (Signed)
[facility] Counselor/Therapist Progress Note  Patient ID: Crystal Diaz, MRN: [id number],    Date: 11/29/2021  Time Spent: 30 mins  Treatment Type: Individual Therapy  Reported Symptoms: Pt presented for a follow up session, via webex video.  Pt granted consent for the session, stating that she is in her car with no one else present.  I shared with pt that I am in my office with no one else here either.  Previous pt of Anita Pardo; saw her for about 3 yrs.    Mental Status Exam: Appearance:  Casual     Behavior: Appropriate  Motor: Normal  Speech/Language:  Clear and Coherent  Affect: Appropriate  Mood: normal  Thought process: normal  Thought content:   WNL  Sensory/Perceptual disturbances:   WNL  Orientation: oriented to person, place, time/date, and situation  Attention: Good  Concentration: Good  Memory: WNL  Fund of knowledge:  Good  Insight:   Good  Judgment:  Good  Impulse Control: Good   Risk Assessment: Danger to Self:  No Self-injurious Behavior: No Danger to Others: No Duty to Warn:no Physical Aggression / Violence:No  Access to Firearms a concern: No  Gang Involvement:No   Subjective: Pt shares that she "has been OK since our last session.  I am on my way to the school to meet with [name]'s teachers to establish an IEP for his behaviors.  I am hopeful that this will help [name] be more supported.  Also, I met my brother's new attorney (capital defender) in court on Monday of this week and I was very thankful for the amount of help they are being for my brother.  I also saw the victim's father in court and felt bad for him and I wanted to apologize to him for what happened to their loved one.  The attorney told [name] that his process will actually take about 2 years.  The attorney has a team of people that wants to meet with me next month to learn more about [name]."  Pt shares she believes the medicine Lamictal, Klonopin, and Hydroxyzine) is  really helping her feel better and her mood is more stable.  Congratulated pt for being so supportive of [name] and [name] and encouraged pt to continue with her self care activities and we will meet soon for a follow up session; pt will call the office to schedule since she needs to get into the school for the IEP mtg now.  Interventions: Cognitive Behavioral Therapy  Diagnosis:Major depressive disorder, recurrent episode with anxious distress (HCC)  Plan: Treatment Plan Strengths/Abilities:  Intelligent, Intuitive, Willing to participate in therapy Treatment Preferences:  Outpatient Individual Therapy Statement of Needs:  Patient is to use CBT, mindfulness and coping skills to help manage and/or decrease symptoms associated with their diagnosis. Symptoms:  Depressed/Irritable mood, worry, social withdrawal Problems Addressed:  Depressive thoughts, Sadness, Sleep issues, etc. Long Term Goals:  Pt to reduce overall level, frequency, and intensity of the feelings of depression/anxiety as evidenced by decreased irritability, negative self talk, and helpless feelings from 6 to 7 days/week to 0 to 1 days/week, per client report, for at least 3 consecutive months.  Progress: 20% Short Term Goals:  Pt to verbally express understanding of the relationship between feelings of depression/anxiety and their impact on thinking patterns and behaviors.  Pt to verbalize an understanding of the role that distorted thinking plays in creating fears, excessive worry, and ruminations.  Progress: 20% Target Date:  12/28/2021 Frequency:    Bi-weekly Modality:  Cognitive Behavioral Therapy Interventions by Therapist:  Therapist will use CBT, Mindfulness exercises, Coping skills and Referrals, as needed by client. Client has verbally approved this treatment plan.   B , LCMHC 

## 2021-12-02 ENCOUNTER — Other Ambulatory Visit (HOSPITAL_COMMUNITY): Payer: Self-pay

## 2021-12-03 ENCOUNTER — Encounter (HOSPITAL_COMMUNITY): Payer: Self-pay

## 2021-12-03 ENCOUNTER — Other Ambulatory Visit (HOSPITAL_COMMUNITY): Payer: Self-pay

## 2021-12-03 DIAGNOSIS — K219 Gastro-esophageal reflux disease without esophagitis: Secondary | ICD-10-CM

## 2021-12-06 ENCOUNTER — Other Ambulatory Visit (HOSPITAL_COMMUNITY): Payer: Self-pay

## 2021-12-09 ENCOUNTER — Other Ambulatory Visit (HOSPITAL_COMMUNITY): Payer: Self-pay | Admitting: *Deleted

## 2021-12-09 ENCOUNTER — Telehealth (HOSPITAL_COMMUNITY): Payer: Self-pay | Admitting: *Deleted

## 2021-12-09 ENCOUNTER — Other Ambulatory Visit: Payer: Self-pay

## 2021-12-09 ENCOUNTER — Other Ambulatory Visit (HOSPITAL_COMMUNITY): Payer: Self-pay

## 2021-12-09 DIAGNOSIS — F419 Anxiety disorder, unspecified: Secondary | ICD-10-CM

## 2021-12-09 DIAGNOSIS — F331 Major depressive disorder, recurrent, moderate: Secondary | ICD-10-CM

## 2021-12-09 MED ORDER — CLONAZEPAM 0.5 MG PO TABS: 0.5000 mg | ORAL_TABLET | Freq: Every day | ORAL | 0 refills | Status: DC | PRN

## 2021-12-09 MED ORDER — LAMOTRIGINE 150 MG PO TABS
150.0000 mg | ORAL_TABLET | Freq: Every day | ORAL | 0 refills | Status: DC
  Filled 2021-12-09: qty 8, 8d supply, fill #0

## 2021-12-09 NOTE — Telephone Encounter (Signed)
Spoke with patient, she had to start taking pantoprazole 20 mg twice a day. Taking only one pill a day was not helping her stomach, she switched back to consistently taking 40 mg daily.  She is needing a refill for this medication and is requesting to have 40 mg pills instead of 20 mg.

## 2021-12-09 NOTE — Telephone Encounter (Signed)
  Encourage patient to contact the pharmacy for refills or they can request refills through [facility]  Pt stated she need 40 MG   .Please advise   LAST APPOINTMENT DATE:  Please schedule appointment if longer than 1 year  NEXT APPOINTMENT DATE:  MEDICATION:pantoprazole (PROTONIX) 20 MG tablet   Is the patient out of medication?   PHARMACY:[facility] [place] [facility]   Let patient know to contact pharmacy at the end of the day to make sure medication is ready.  Please notify patient to allow 48-72 hours to process  CLINICAL FILLS OUT ALL BELOW:   LAST REFILL:  QTY:  REFILL DATE:    OTHER COMMENTS:    Okay for refill?  Please advise

## 2021-12-09 NOTE — Telephone Encounter (Signed)
Done

## 2021-12-09 NOTE — Telephone Encounter (Signed)
Ok to bridge # 5 tab of klonopin 0.5 mg until her appointment.

## 2021-12-09 NOTE — Telephone Encounter (Signed)
Pt called requesting refills of both Klonopin, 1 QD PRN, last filled 11/17/21 #15, and Lamictal 150 mg 1 QD. Lamictal bridge sent. Pt has an upcoming appointment on 12/16/21. No showed last appointment. Ok to bridge the Klonopin?

## 2021-12-10 ENCOUNTER — Other Ambulatory Visit: Payer: Self-pay

## 2021-12-10 MED ORDER — PANTOPRAZOLE SODIUM 40 MG PO TBEC
40.0000 mg | DELAYED_RELEASE_TABLET | Freq: Every day | ORAL | 0 refills | Status: DC
  Filled 2021-12-10: qty 90, 90d supply, fill #0

## 2021-12-10 MED ORDER — CLONAZEPAM 0.5 MG PO TABS
0.5000 mg | ORAL_TABLET | Freq: Every day | ORAL | 0 refills | Status: DC | PRN
  Filled 2021-12-10: qty 5, 5d supply, fill #0

## 2021-12-10 NOTE — Telephone Encounter (Signed)
Noted. Rx for pantoprazole 40 mg sent to [facility].

## 2021-12-13 ENCOUNTER — Ambulatory Visit: Payer: No Typology Code available for payment source | Admitting: Psychology

## 2021-12-16 ENCOUNTER — Telehealth (HOSPITAL_BASED_OUTPATIENT_CLINIC_OR_DEPARTMENT_OTHER): Payer: No Typology Code available for payment source | Admitting: Psychiatry

## 2021-12-16 ENCOUNTER — Other Ambulatory Visit: Payer: Self-pay

## 2021-12-16 ENCOUNTER — Encounter (HOSPITAL_COMMUNITY): Payer: Self-pay | Admitting: Psychiatry

## 2021-12-16 VITALS — Wt 227.0 lb

## 2021-12-16 DIAGNOSIS — F331 Major depressive disorder, recurrent, moderate: Secondary | ICD-10-CM | POA: Diagnosis not present

## 2021-12-16 DIAGNOSIS — F419 Anxiety disorder, unspecified: Secondary | ICD-10-CM | POA: Diagnosis not present

## 2021-12-16 MED ORDER — CLONAZEPAM 0.5 MG PO TABS
0.5000 mg | ORAL_TABLET | Freq: Every day | ORAL | 0 refills | Status: DC | PRN
  Filled 2021-12-16: qty 30, 30d supply, fill #0

## 2021-12-16 MED ORDER — HYDROXYZINE HCL 25 MG PO TABS
25.0000 mg | ORAL_TABLET | Freq: Three times a day (TID) | ORAL | 2 refills | Status: DC | PRN
  Filled 2021-12-16: qty 90, 30d supply, fill #0

## 2021-12-16 MED ORDER — LAMOTRIGINE 200 MG PO TABS
200.0000 mg | ORAL_TABLET | Freq: Every day | ORAL | 2 refills | Status: DC
  Filled 2021-12-16: qty 30, 30d supply, fill #0
  Filled 2022-01-24: qty 30, 30d supply, fill #1

## 2021-12-16 NOTE — Progress Notes (Signed)
Virtual Visit via Video Note  I connected with [name] on 12/16/21 at 11:00 AM EST by a video enabled telemedicine application and verified that I am speaking with the correct person using two identifiers.  Location: Patient: Work Provider: Economist   I discussed the limitations of evaluation and management by telemedicine and the availability of in person appointments. The patient expressed understanding and agreed to proceed.  History of Present Illness: Patient is evaluated by video session.  She reported irritability and frustration because not able to get the prescription from the pharmacy.  She had requested refills for Klonopin, Lamictal and hydroxyzine.  These medicines were sent to the pharmacy but nurse did not notified and patient is upset and she is without medication for past few days.  She reported things were somewhat better when she was taking medication however lately started to have crying spells.  She feels no one cares about her.  She is upset because her fianc is not engaged with her and she has not seen him since last Friday.  She decided that she will no longer keep him in the house because he does not pay the bills and not supportive.  She continues to struggle with her 83 year old son who has a lot of issues at school.  She had called multiple times counselor and [facility].  She also frustrated because her younger brother who had mental illness and getting help in the present.  She went last Friday to see him but not able to see his face in person.  She reported racing thoughts, anxiety.  She liked the hydroxyzine Klonopin and Lamictal but since not taking consistently due to ran out she noticed symptoms are coming back.  She had limited support system.  She finally put up the tree but feels that did not get any help from anyone.  She has not decorated yet.  She tried to engage and church activities.  She has taken few times Klonopin before  going to church and visiting her brother and that helps calm her down.  She denies any hallucination but feels hopeless and worthless.  She feels everybody is busy in their life and no one cares about anyone.  She reported continued to have passive including suicidal thoughts but no plan or made any intent.  She admitted negative and ruminative thoughts and get some time irritable.  However she like to continue medicine because she feels it helped.  Her appetite is fair she feels she had gained weight since starting medication but I discussed with started the medication in July and since then there is no significant weight gain or weight loss.   Past Psychiatric History: Denies any history of suicidal attempt, inpatient treatment, psychosis, hallucination, PTSD. Tried Zoloft, Effexor and Lexapro from PCP but they were ineffective.  Psychiatric Specialty Exam: Physical Exam  Review of Systems  Weight 227 lb (103 kg).There is no height or weight on file to calculate BMI.  General Appearance: Casual  Eye Contact:  Fair  Speech:  Slow  Volume:  Decreased  Mood:  Depressed, Dysphoric, and Irritable  Affect:  Constricted and Depressed  Thought Process:  Descriptions of Associations: Intact  Orientation:  Full (Time, Place, and Person)  Thought Content:  Rumination  Suicidal Thoughts:  Yes.  without intent/plan  Homicidal Thoughts:  No  Memory:  Immediate;   Good Recent;   Good Remote;   Good  Judgement:  Fair  Insight:  Shallow  Psychomotor  Activity:  Decreased  Concentration:  Concentration: Fair and Attention Span: Fair  Recall:  Good  Fund of Knowledge:  Good  Language:  Good  Akathisia:  No  Handed:  Right  AIMS (if indicated):     Assets:  Communication Skills Desire for Improvement Housing Talents/Skills Transportation  ADL's:  Intact  Cognition:  WNL  Sleep:   fair      Assessment and Plan: Anxiety.  Major depressive disorder, recurrent.  Reassurance given.  She is  seeing Delsa Bern every 2 weeks.  I review notes from IOP.  She is now taking Lamictal 150 mg and she noticed improvement.  She also taking hydroxyzine prescribed up to 3 times a day but usually she forgot to take at bedtime.  She is also taking Klonopin as needed for severe anxiety.  I recommend should try Lamictal higher dose from 150 mg to 200 mg to help her mood lability and anxiety and depression.  I encouraged to try to take the hydroxyzine 3 times a day to help her sleep.  Continue Klonopin 0.5 mg to take as needed for severe panic attack and anxiety.  I also encouraged if she do not get called back message from our office then she can call us back immediately.  I agree noncompliance of medication may cause contributing to worsening of symptoms.  Patient is hoping to have a beach trip next week but does not want to go with fianc because she does not feel good with his company.  I encouraged considering going with her son to the beach.  Discuss safety concerns and anytime having active suicidal thoughts or homicidal thoughts then need to call 911 or go to local emergency room.  Follow-up in 4 to 6 weeks.  Follow Up Instructions:    I discussed the assessment and treatment plan with the patient. The patient was provided an opportunity to ask questions and all were answered. The patient agreed with the plan and demonstrated an understanding of the instructions.   The patient was advised to call back or seek an in-person evaluation if the symptoms worsen or if the condition fails to improve as anticipated.  Collaboration of Care: Other provider involved in patient's care AEB notes are available in epic to review.  Patient/Guardian was advised Release of Information must be obtained prior to any record release in order to collaborate their care with an outside provider. Patient/Guardian was advised if they have not already done so to contact the registration department to sign all necessary forms in  order for [place] to release information regarding their care.   Consent: Patient/Guardian gives verbal consent for treatment and assignment of benefits for services provided during this visit. Patient/Guardian expressed understanding and agreed to proceed.    I provided 25 minutes of non-face-to-face time during this encounter.   Cleotis Nipper, MD

## 2021-12-26 ENCOUNTER — Other Ambulatory Visit: Payer: Self-pay | Admitting: Primary Care

## 2021-12-26 ENCOUNTER — Other Ambulatory Visit: Payer: Self-pay

## 2021-12-26 DIAGNOSIS — E538 Deficiency of other specified B group vitamins: Secondary | ICD-10-CM

## 2021-12-26 NOTE — Telephone Encounter (Signed)
Patient called in to follow up on refill request.  

## 2021-12-26 NOTE — Telephone Encounter (Signed)
Patient needs a lab appt to recheck B12 levels before we continue with the injections. Please have her come in as soon as possible. Or she can go to [facility]. Let me know once you've spoken with her.

## 2021-12-27 ENCOUNTER — Other Ambulatory Visit: Payer: Self-pay

## 2021-12-27 ENCOUNTER — Other Ambulatory Visit (INDEPENDENT_AMBULATORY_CARE_PROVIDER_SITE_OTHER): Payer: No Typology Code available for payment source

## 2021-12-27 DIAGNOSIS — E559 Vitamin D deficiency, unspecified: Secondary | ICD-10-CM | POA: Diagnosis not present

## 2021-12-27 DIAGNOSIS — E538 Deficiency of other specified B group vitamins: Secondary | ICD-10-CM

## 2021-12-27 NOTE — Telephone Encounter (Signed)
Called and spoke with patient, she never started OTC b12 supplements as advised in your result message from 09/09/21. She will come in 12/27/21 @ 3:00 to get b12 labs done. She states she has no interest in starting oral supplements, she only wants to do injections.

## 2021-12-27 NOTE — Telephone Encounter (Signed)
Patient stated she has been doing b12 injections for over a year and ran out at the beginning of December. Will be in touch when lab results return.

## 2021-12-27 NOTE — Telephone Encounter (Signed)
Please notify patient that I assumed she was already on the injections. Okay, good to know that she's not been taking anything.  Will be in touch as soon as labs return.

## 2021-12-28 ENCOUNTER — Other Ambulatory Visit: Payer: Self-pay | Admitting: Primary Care

## 2021-12-28 DIAGNOSIS — E538 Deficiency of other specified B group vitamins: Secondary | ICD-10-CM

## 2021-12-28 LAB — VITAMIN B12: Vitamin B-12: 342 pg/mL (ref 200–1100)

## 2021-12-28 LAB — VITAMIN D 25 HYDROXY (VIT D DEFICIENCY, FRACTURES): Vit D, 25-Hydroxy: 8 ng/mL — ABNORMAL LOW (ref 30–100)

## 2021-12-28 MED ORDER — CYANOCOBALAMIN 1000 MCG/ML IJ SOLN
1000.0000 ug | INTRAMUSCULAR | 1 refills | Status: DC
  Filled 2021-12-28: qty 3, 90d supply, fill #0
  Filled 2022-04-08 (×2): qty 3, 90d supply, fill #1

## 2021-12-29 ENCOUNTER — Other Ambulatory Visit: Payer: Self-pay

## 2021-12-29 DIAGNOSIS — E538 Deficiency of other specified B group vitamins: Secondary | ICD-10-CM

## 2021-12-30 ENCOUNTER — Other Ambulatory Visit: Payer: Self-pay

## 2022-01-01 ENCOUNTER — Other Ambulatory Visit (HOSPITAL_COMMUNITY): Payer: Self-pay

## 2022-01-06 ENCOUNTER — Other Ambulatory Visit: Payer: Self-pay

## 2022-01-06 ENCOUNTER — Encounter (HOSPITAL_COMMUNITY): Payer: Self-pay

## 2022-01-06 ENCOUNTER — Emergency Department (HOSPITAL_COMMUNITY)
Admission: EM | Admit: 2022-01-06 | Discharge: 2022-01-06 | Disposition: A | Discharge status: Home / Self Care | Payer: No Typology Code available for payment source | Attending: Emergency Medicine | Admitting: Emergency Medicine

## 2022-01-06 DIAGNOSIS — M545 Low back pain, unspecified: Secondary | ICD-10-CM

## 2022-01-06 LAB — I-STAT BETA HCG BLOOD, ED (MC, WL, AP ONLY): I-stat hCG, quantitative: <5 m[IU]/mL (ref <5)

## 2022-01-06 MED ORDER — SODIUM CHLORIDE 0.9 % IV SOLN: INTRAVENOUS | Status: DC

## 2022-01-06 MED ORDER — ONDANSETRON HCL 4 MG/2ML IJ SOLN
4.0000 mg | Freq: Once | INTRAMUSCULAR | Status: AC
  Administered 2022-01-06: 4 mg via INTRAVENOUS
  Filled 2022-01-06: qty 2

## 2022-01-06 MED ORDER — LIDOCAINE 5 % EX PTCH
1.0000 | MEDICATED_PATCH | Freq: Once | CUTANEOUS | Status: DC
  Administered 2022-01-06: 1 via TRANSDERMAL
  Filled 2022-01-06: qty 1

## 2022-01-06 MED ORDER — HYDROMORPHONE HCL 1 MG/ML IJ SOLN
1.0000 mg | Freq: Once | INTRAMUSCULAR | Status: AC
  Administered 2022-01-06: 1 mg via INTRAVENOUS
  Filled 2022-01-06: qty 1

## 2022-01-06 MED ORDER — SODIUM CHLORIDE 0.9 % IV BOLUS: 1000.0000 mL | Freq: Once | INTRAVENOUS | Status: DC

## 2022-01-06 MED ORDER — DIAZEPAM 5 MG/ML IJ SOLN
5.0000 mg | Freq: Once | INTRAMUSCULAR | Status: AC
  Administered 2022-01-06: 5 mg via INTRAVENOUS
  Filled 2022-01-06: qty 2

## 2022-01-06 MED ORDER — OXYCODONE-ACETAMINOPHEN 5-325 MG PO TABS: 1.0000 | ORAL_TABLET | Freq: Three times a day (TID) | ORAL | 0 refills | Status: DC | PRN

## 2022-01-06 MED ORDER — HYDROMORPHONE HCL 1 MG/ML IJ SOLN
0.5000 mg | Freq: Once | INTRAMUSCULAR | Status: AC
  Administered 2022-01-06: 0.5 mg via INTRAVENOUS
  Filled 2022-01-06: qty 1

## 2022-01-06 MED ORDER — LIDOCAINE 4 % EX PTCH: 1.0000 | MEDICATED_PATCH | CUTANEOUS | 0 refills | Status: DC

## 2022-01-06 NOTE — ED Provider Notes (Signed)
[facility] EMERGENCY DEPARTMENT Provider Note   CSN: [id number] Arrival date & time: 01/06/22  0845     History  Chief Complaint  Patient presents with   Back Pain    JOELYN ANDREASON is a 31 y.o. female presenting to the ED with acutely worsening low back pain.  Denies any preceding injury.  Mild dull low back pain over the 5 years, however has steadily increased over the last month.    This past Friday after work, noticed severe escalation in pain.  Went to orthopedic urgent care, x-rays were negative for fracture, was given muscle relaxant and pain medicine per patient.  However pain is continued to worsen.    This morning she states she was unable to get out of bed, and that moving her legs resulted in excruciating pain.  Has had difficulty using the bathroom due to the pain.  Does not believe she feels weak in her legs, though unsure because she is afraid to move them.  Describes pain as relentless, only finding mild to minimal relief while laying on her side.  Denies saddle anesthesia, urinary or bowel incontinence, or recent injury.  Denies Hx of IVDU, malignancy, fever, chills, night sweats.    The history is provided by the patient and medical records.  Back Pain     Home Medications Prior to Admission medications   Medication Sig Start Date End Date Taking? Authorizing Provider  lidocaine 4 % Place 1 patch onto the skin daily. 01/06/22  Yes Prince Rome, PA-C  oxyCODONE-acetaminophen (PERCOCET/ROXICET) 5-325 MG tablet Take 1 tablet by mouth every 8 (eight) hours as needed for severe pain. 01/06/22  Yes Prince Rome, PA-C  albuterol (VENTOLIN HFA) 108 (90 Base) MCG/ACT inhaler Inhale 1-2 puffs into the lungs every 6 (six) hours as needed for wheezing or shortness of breath. 11/08/21   Mar Daring, PA-C  celecoxib (CELEBREX) 100 MG capsule Take 1 capsule (100 mg total) by mouth daily. 11/04/21     clonazePAM (KLONOPIN) 0.5 MG tablet  Take 1 tablet (0.5 mg total) by mouth daily as needed for anxiety 12/16/21   Arfeen, Arlyce Harman, MD  cyanocobalamin (VITAMIN B12) 1000 MCG/ML injection Inject 1 mL (1,000 mcg total) into the muscle every 30 (thirty) days. 12/28/21   Pleas Koch, NP  desogestrel-ethinyl estradiol (MIRCETTE) 0.15-0.02/0.01 MG (21/5) tablet Take 1 tablet by mouth daily. 08/06/21   Harlin Heys, MD  hydrOXYzine (ATARAX) 25 MG tablet Take 1 tablet (25 mg total) by mouth 3 (three) times daily as needed for anxiety (insomnia). 12/16/21   Arfeen, Arlyce Harman, MD  lamoTRIgine (LAMICTAL) 200 MG tablet Take 1 tablet (200 mg total) by mouth daily. 12/16/21   Arfeen, Arlyce Harman, MD  pantoprazole (PROTONIX) 40 MG tablet Take 1 tablet (40 mg total) by mouth daily for heartburn. 12/10/21   Pleas Koch, NP  propranolol ER (INDERAL LA) 120 MG 24 hr capsule Take 1 capsule (120 mg total) by mouth at bedtime. For headache prevention 10/13/21   Pleas Koch, NP  SYRINGE-NEEDLE, DISP, 3 ML (B-D 3CC LUER-LOK SYR 23GX1") 23G X 1" 3 ML MISC Use as directed 09/09/21   Pleas Koch, NP  Vitamin D, Ergocalciferol, (DRISDOL) 1.25 MG (50000 UNIT) CAPS capsule Take 1 capsule (50,000 Units total) by mouth once a week for 12 weeks 11/28/21   Pleas Koch, NP      Allergies    Patient has no known allergies.  Review of Systems   Review of Systems  Musculoskeletal:  Positive for back pain.    Physical Exam Updated Vital Signs BP (!) 133/94 (BP Location: Right Arm)   Pulse 76   Temp 98.2 F (36.8 C)   Resp (!) 22   Ht 5\' 9"  (1.753 m)   Wt 106.6 kg   SpO2 100%   BMI 34.70 kg/m  Physical Exam Vitals and nursing note reviewed.  Constitutional:      General: She is not in acute distress.    Appearance: She is well-developed.     Comments: Appears uncomfortable, laying on side in position of comfort  HENT:     Head: Normocephalic and atraumatic.  Eyes:     Conjunctiva/sclera: Conjunctivae normal.  Cardiovascular:      Rate and Rhythm: Normal rate and regular rhythm.     Heart sounds: No murmur heard. Pulmonary:     Effort: Pulmonary effort is normal. No respiratory distress.     Breath sounds: Normal breath sounds.  Abdominal:     Palpations: Abdomen is soft.     Tenderness: There is no abdominal tenderness.  Musculoskeletal:        General: Tenderness present. No swelling.     Cervical back: Neck supple.     Comments: Midline tenderness near L3-L5.  No step-off.  Mild lumbar paraspinal muscle tenderness.  No C/T spine midline or paraspinal muscle tenderness.  Skin:    General: Skin is warm and dry.     Capillary Refill: Capillary refill takes less than 2 seconds.  Neurological:     Mental Status: She is alert and oriented to person, place, and time.     Comments: GCS 15.  Sensation appears grossly intact of all extremities.  DTRs appear 2+ to 3+ both lower and upper extremities.  Unable to cooperate with lower extremity ROM or SLR assessment due to reported elicited pain.  Gait appears antalgic, slow, and requiring some assistance.  No saddle anesthesia.  Psychiatric:        Mood and Affect: Mood normal.    ED Results / Procedures / Treatments   Labs (all labs ordered are listed, but only abnormal results are displayed) Labs Reviewed  I-STAT BETA HCG BLOOD, ED ([facility], [facility], [facility] ONLY)    EKG None  Radiology No results found.  Procedures Procedures    Medications Ordered in ED Medications  lidocaine (LIDODERM) 5 % 1 patch (1 patch Transdermal Patch Applied 01/06/22 1100)  0.9 %  sodium chloride infusion (has no administration in time range)  sodium chloride 0.9 % bolus 1,000 mL (has no administration in time range)  HYDROmorphone (DILAUDID) injection 0.5 mg (0.5 mg Intravenous Given 01/06/22 1053)  ondansetron (ZOFRAN) injection 4 mg (4 mg Intravenous Given 01/06/22 1053)  HYDROmorphone (DILAUDID) injection 1 mg (1 mg Intravenous Given 01/06/22 1256)  ondansetron (ZOFRAN) injection 4  mg (4 mg Intravenous Given 01/06/22 1256)  diazepam (VALIUM) injection 5 mg (5 mg Intravenous Given 01/06/22 1256)    ED Course/ Medical Decision Making/ A&P                           Medical Decision Making Risk OTC drugs. Prescription drug management.   31 y.o. female presents to the ED for concern of Back Pain   This involves an extensive number of treatment options, and is a complaint that carries with it a high risk of complications and morbidity.  Past Medical History / Co-morbidities / Social History: Hx of anxiety, depression Social Determinants of Health include: None  Additional History:  Obtained by chart review.  Notably recent [facility] visit, see for details.  Lab Tests: I ordered, and personally interpreted labs.  The pertinent results include:   Negative pregnancy  Imaging Studies: None  ED Course: Patient with back pain.  Well appearing on exam.  Afebrile.  No recent injury.  Hx of chronic lower back pain, worsening over the last few months, and more so over the last 2-3 days.  No neurological deficits and normal neuro exam as described above.  With some midline spinal tenderness.  Friday after work, noticed severe escalation in pain.  Went to orthopedic urgent care, x-rays were negative for fracture, was given muscle relaxant and pain medicine per patient with minimal improvement.  Low concern for fracture or dislocation.  Patient can walk but states is painful.  No loss of bowel or bladder control, or saddle anesthesia.  Low concern for cauda equina, discitis, or infectious spinal process.  No fever, night sweats, weight loss, h/o cancer, IVDU.  Unable to cooperate with SLRs.  Lower extremities appear neurovascularly intact.  ROM mildly limited due to elicited pain.  Negative pregnancy.  No abdominal pain, N/V, changes in bowel/urinary habits, flank pain, chest pain, or shortness of breath.  Dilaudid and zofran provided.  Plan to reassess. Upon re-evaluation,  pt reports lessening of pain however still present.  Another 0.5 mg dilaudid provided and 5 mg valium and lidocaine patch.  Plan to reassess.  If no improvement, may consider MRI imaging. Upon re-evaluation, pt reports significant improvement and essentially complete resolution of acute lower back pain.  Pt ambulating without any difficulty.  Pain managed in ED.  Heat therapy and conservative symptom mangagement discussed with patient.  Recommend close follow up with PCP/Orthopedics as planned.  Strict return precautions discussed.  Short course pain management sent to pharmacy.  Pt reports satisfaction with today's encounter.  Patient in NAD and in good condition at time of discharge.  Disposition: After consideration the patient's encounter today, I do not feel today's workup suggests an emergent condition requiring admission or immediate intervention beyond what has been performed at this time.  Safe for discharge; instructed to return immediately for worsening symptoms, change in symptoms or any other concerns.  I have reviewed the patients home medicines and have made adjustments as needed.  Discussed course of treatment with the patient, whom demonstrated understanding.  Patient in agreement and has no further questions.    I discussed this case with my attending physician Dr. Rogene Houston, who agreed with the proposed treatment course and cosigned this note including patient's presenting symptoms, physical exam, and planned diagnostics and interventions.  Attending physician stated agreement with plan or made changes to plan which were implemented.    This chart was dictated using voice recognition software.  Despite best efforts to proofread, errors can occur which can change the documentation meaning.         Final Clinical Impression(s) / ED Diagnoses Final diagnoses:  Midline low back pain, unspecified chronicity, unspecified whether sciatica present    Rx / DC Orders ED Discharge  Orders          Ordered    oxyCODONE-acetaminophen (PERCOCET/ROXICET) 5-325 MG tablet  Every 8 hours PRN        01/06/22 1420    lidocaine 4 %  Every 24 hours        01/06/22  [address]              Cecil Cobbs, [place] 01/13/22 1000    Vanetta Mulders, MD 01/18/22 [phone]

## 2022-01-06 NOTE — ED Triage Notes (Signed)
Coming from home Lower back pain into buttock. Gotten worse over the last 4 days Went to ortho on Friday, imaging doneand given muscle relaxers 100 fent given by EMS  HR88 97% RA BP 120/80

## 2022-01-06 NOTE — Discharge Instructions (Addendum)
Back Pain:  Your back pain should be treated with medicines such as ibuprofen or aleve and this back pain should get better over the next 2 weeks.  However if you develop severe or worsening pain, low back pain with fever, numbness, weakness or inability to walk or urinate, you should return to the ER immediately.  Please follow up with your doctor this week for a recheck if still having symptoms. Low back pain is discomfort in the lower back that may be due to injuries to muscles and ligaments around the spine.  Occasionally, it may be caused by a a problem to a part of the spine called a disc.  The pain may last several days or a week;  However, most patients get completely well in 4 weeks.  Self - care:  The application of heat can help soothe the pain.  Maintaining your daily activities, including walking, is encourged, as it will help you get better faster than just staying in bed.  Medications are also useful to help with pain control.  A commonly prescribed medications includes acetaminophen.  This medication is generally safe, though you should not take more than 8 of the extra strength (500mg ) pills a day.  Non steroidal anti inflammatory medications including Ibuprofen and naproxen;  These medications help both pain and swelling and are very useful in treating back pain.  They should be taken with food, as they can cause stomach upset, and more seriously, stomach bleeding.    Muscle relaxants:  These medications can help with muscle tightness that is a cause of lower back pain.  Most of these medications can cause drowsiness, and it is not safe to drive or use dangerous machinery while taking them.  You will need to follow up with emergeortho provider in 1-2 weeks for reassessment.  Be aware that if you develop new symptoms, such as a fever, leg weakness, difficulty with or loss of control of your urine or bowels, abdominal pain, or more severe pain, you will need to seek medical attention  and  / or return to the Emergency department.  If you do not have a doctor see the list below.  RESOURCE GUIDE  Chronic Pain Problems: Contact [facility]  [phone] Patients need to be referred by their primary care doctor.  Insufficient Money for Medicine: Contact United Way:  call "211" or [facility] (501)647-1860.  No Primary Care Doctor: Call [facility] Connect  [phone] - can help you locate a primary care doctor that  accepts your insurance, provides certain services, etc. Physician Referral [phone]  Agencies that provide inexpensive medical care: - [phone] Family Medicine  [facility] Internal Medicine  [phone] [facility]  [phone] [facility]  [phone] Planned Parenthood  [phone] [facility]  [phone]  Medicaid-accepting [facility] Providers: [facility] Clinic- [address], [address]  [phone], Mon-Fri 9am-7pm, Sat 9am-1pm [facility]- [address] [place], Suite [place]  [place] [facility]- [address]  [place] [facility] Family Medicine- [address]  [phone]- [address] [place], [address], [place]  Only accepts [phone] [facility] [place] patients after they have their name  applied to their card  Self Pay (no insurance) in [facility]: Sickle Cell Patients: Dr [facility], [facility] Internal [facility]  [address] [place], [place] [facility] Urgent Care- [address] [place]  [phone]       -  [facility]- [address]       -     [facility]- see information above (Speak to [employer] if you do not have insurance)       -  [facility]- [address] [place], [phone]       -  [facility]- [address],  [phone]       -  [facility]- [address], [phone]       -  Dr Julio Sicks-  [address]  [address], [place], [phone]       -  [facility]gent Care- [address] [phone]       -  [facility]- [address], [phone], also [address], [phone]       -    [facility]- [address] [place], [phone], 1st & 3rd Saturday   every month, 10am-1pm  1) Find a Doctor and Pay Out of Pocket Although you won't have to find out who is covered by your insurance plan, it is a good idea to ask around and get recommendations. You will then need to call the office and see if the doctor you have chosen will accept you as a new patient and what types of options they offer for patients who are self-pay. Some doctors offer discounts or will set up payment plans for their patients who do not have insurance, but you will need to ask so you aren't surprised when you get to your appointment.  2) Contact Your Local Health Department Not all health departments have doctors that can see patients for sick visits, but many do, so it is worth a call to see if yours does. If you don't know where your local health department is, you can check in your phone book. The CDC also has a tool to help you locate your state's health department, and many state websites also have listings of all of their local health departments.  3) Find a Walk-in Clinic If your illness is not likely to be very severe or complicated, you may want to try a walk in clinic. These are popping up all over the country in pharmacies, drugstores, and shopping centers. They're usually staffed by nurse practitioners or physician assistants that have been trained to treat common illnesses and complaints. They're usually fairly quick and inexpensive. However, if you have serious medical issues or chronic medical problems, these are probably not your best option  STD Testing [facility] Department of [facility], STD Clinic, [address], [place], phone [phone] or [phone].   Monday - Friday, call for an appointment. [facility] Department of [employer], STD Clinic, [place] [address], [place], phone [phone] or [phone].  Monday - Friday, call for an appointment.  Abuse/Neglect: [facility] Child Abuse Hotline 534-018-4143 [facility] Child Abuse Hotline 628-126-5825 (After Hours)  Emergency Shelter:  [facility] (682)041-2438  Maternity Homes: Room at the [place] [facility] 919-632-0099 [facility] (807)132-0614  MRSA Hotline #:   416-675-5292  [facility] Resources  [facility] [place]  [facility] Dept. [address]                 [address]         [address] [place] [address] [place]  Blondell Reveal Phone:  [phone]                                  Phone:  [phone]                   Phone:  828-327-4924  [facility], [phone] [facility]- [phone]       -     [facility] in [place], [address]                                  (508)[phone], [facility] Child Abuse Hotline (364)406-4953 or 272-600-7960 (After Hours)   Behavioral Health Services  Substance Abuse Resources: [facility] and Drug Services  361-543-1869 [facility] 9597278762 [facility] [place] 8456160212 Floydene Flock 810-422-8987 Residential & Outpatient Substance Abuse Program  972-113-4837  Psychological Services: [facility]  [phone] [facility] Services  [phone] [facility], [phone] [place]. [address], [place], ACCESS LINE: 807-525-9903 or (801)076-2485, [employer]  Dental Assistance  If unable to pay or uninsured, contact:  [facility] or [facility]. to become qualified  for the adult dental clinic.  Patients with Medicaid: [facility] [phone] [address], [phone] [address] [phone]  If unable to pay, or uninsured, contact [facility] 3086207617) or [facility] Department [phone] in [place], [phone] in [facility]) to become qualified for the adult dental clinic  Other Low-Cost Community Dental Services: [facility]- [address] [place], [place], [place], [address], [phone], [address]. [address], 2nd and 4th Thursday of the month at 6:30am.  10 clients each day by appointment, can sometimes see walk-in patients if someone does not show for an appointment. [facility]- [address] Ether Griffins [place], [place], [address], [phone] [facility] [address] [place], [place], [address], [phone] [facility] Health Department- [phone] [facility] Health Department- [phone] [facility] [phone]

## 2022-01-07 ENCOUNTER — Encounter (HOSPITAL_COMMUNITY): Payer: Self-pay | Admitting: Emergency Medicine

## 2022-01-07 ENCOUNTER — Emergency Department (HOSPITAL_COMMUNITY)
Admission: EM | Admit: 2022-01-07 | Discharge: 2022-01-08 | Discharge status: Against Medical Advice | Payer: No Typology Code available for payment source | Attending: Student | Admitting: Student

## 2022-01-07 ENCOUNTER — Emergency Department (HOSPITAL_COMMUNITY): Payer: No Typology Code available for payment source

## 2022-01-07 DIAGNOSIS — Z5321 Procedure and treatment not carried out due to patient leaving prior to being seen by health care provider: Secondary | ICD-10-CM | POA: Diagnosis not present

## 2022-01-07 DIAGNOSIS — M546 Pain in thoracic spine: Secondary | ICD-10-CM | POA: Diagnosis present

## 2022-01-07 LAB — I-STAT BETA HCG BLOOD, ED (MC, WL, AP ONLY): I-stat hCG, quantitative: <5 m[IU]/mL (ref <5)

## 2022-01-07 NOTE — ED Provider Triage Note (Signed)
Emergency Medicine Provider Triage Evaluation Note  Crystal Diaz , a 31 y.o. female  was evaluated in triage.  Pt complains of severe low back pain.  Patient was evaluated the same emergency department for the same complaint yesterday.  She notes her symptoms worsened overnight and that she is now having trouble ambulating due to the severe pain.  She denies saddle anesthesia, urinary incontinence, urinary retention, fecal incontinence.  Patient has also been evaluated outpatient at [facility] in [place] who reportedly recommended she come to the emergency department for imaging  Review of Systems  Positive: As above Negative: As above  Physical Exam  BP (!) 140/83 (BP Location: Right Arm)   Pulse 89   Temp 98.4 F (36.9 C) (Oral)   Resp 18   SpO2 100%  Gen:   Awake, no distress   Resp:  Normal effort  MSK:   Moves extremities without difficulty  Other:    Medical Decision Making  Medically screening exam initiated at 6:39 PM.  Appropriate orders placed.  [name] was informed that the remainder of the evaluation will be completed by another provider, this initial triage assessment does not replace that evaluation, and the importance of remaining in the ED until their evaluation is complete.     Darrick Grinder, [place] 01/07/22 [phone]

## 2022-01-07 NOTE — ED Triage Notes (Signed)
Pt endorses mid lower back pain. Sent from emerg othro no Friday and told she would need MRI since pain has not improved since medications.

## 2022-01-08 ENCOUNTER — Other Ambulatory Visit: Payer: Self-pay

## 2022-01-08 MED ORDER — "BD LUER-LOK SYRINGE 23G X 1"" 3 ML MISC"
0 refills | Status: DC
  Filled 2022-01-08: qty 6, fill #0

## 2022-01-08 NOTE — ED Notes (Signed)
Pt stated she was leaving.  

## 2022-01-14 ENCOUNTER — Other Ambulatory Visit: Payer: Self-pay

## 2022-01-14 MED ORDER — TRAMADOL HCL 50 MG PO TABS
50.0000 mg | ORAL_TABLET | Freq: Four times a day (QID) | ORAL | 0 refills | Status: DC
  Filled 2022-01-14: qty 30, 8d supply, fill #0

## 2022-01-14 MED ORDER — CYCLOBENZAPRINE HCL 5 MG PO TABS
5.0000 mg | ORAL_TABLET | Freq: Two times a day (BID) | ORAL | 0 refills | Status: DC
  Filled 2022-01-14: qty 60, 30d supply, fill #0

## 2022-01-15 ENCOUNTER — Telehealth: Payer: Self-pay | Admitting: Primary Care

## 2022-01-15 NOTE — Telephone Encounter (Signed)
Called and spoke with patient, she first started experiencing nausea and stomach cramps last night. She has been vomiting since then and experiencing chills. She states she hasn't been eating food because she can't keep anything down. She can't think of anything that would have caused the nausea, she didn't eat anything out of the ordinary.

## 2022-01-15 NOTE — Telephone Encounter (Signed)
Please call patient to get some additional information. Symptom onset, history of nausea? Any other symptoms?

## 2022-01-15 NOTE — Telephone Encounter (Signed)
Pt called in requesting  medication be called in for nausea . Pt did not was to make an appointment wanted message sent to PCP Please advise [phone]

## 2022-01-16 NOTE — Telephone Encounter (Signed)
Called and spoke with patient. She declined in person or virtual appointment. When asked if she was feeling better she stated no and that she would suffer through it. Advised to call back to make appointment if she changed her mind.

## 2022-01-16 NOTE — Telephone Encounter (Signed)
This sounds like an acute visit which warrants evaluation. I can add her on today for an in person or virtual visit at 3:20 pm.

## 2022-01-16 NOTE — Telephone Encounter (Signed)
Noted  

## 2022-01-17 ENCOUNTER — Other Ambulatory Visit: Payer: Self-pay

## 2022-01-17 DIAGNOSIS — Z9884 Bariatric surgery status: Secondary | ICD-10-CM | POA: Diagnosis not present

## 2022-01-17 MED ORDER — PANTOPRAZOLE SODIUM 40 MG PO TBEC
40.0000 mg | DELAYED_RELEASE_TABLET | Freq: Every day | ORAL | 3 refills | Status: DC
  Filled 2022-01-17 – 2022-04-28 (×2): qty 90, 90d supply, fill #0
  Filled 2022-08-15: qty 90, 90d supply, fill #1
  Filled 2022-12-08: qty 90, 90d supply, fill #2

## 2022-01-20 ENCOUNTER — Telehealth (HOSPITAL_COMMUNITY): Payer: Self-pay | Admitting: Psychiatry

## 2022-01-24 ENCOUNTER — Other Ambulatory Visit: Payer: Self-pay

## 2022-01-24 ENCOUNTER — Other Ambulatory Visit (HOSPITAL_COMMUNITY): Payer: Self-pay | Admitting: Psychiatry

## 2022-01-24 DIAGNOSIS — F331 Major depressive disorder, recurrent, moderate: Secondary | ICD-10-CM

## 2022-01-24 DIAGNOSIS — F419 Anxiety disorder, unspecified: Secondary | ICD-10-CM

## 2022-01-27 ENCOUNTER — Telehealth (HOSPITAL_COMMUNITY): Payer: Self-pay

## 2022-01-27 ENCOUNTER — Other Ambulatory Visit (HOSPITAL_COMMUNITY): Payer: Self-pay | Admitting: Psychiatry

## 2022-01-27 ENCOUNTER — Other Ambulatory Visit: Payer: Self-pay

## 2022-01-27 ENCOUNTER — Telehealth (HOSPITAL_COMMUNITY): Payer: 59 | Admitting: Psychiatry

## 2022-01-27 DIAGNOSIS — F419 Anxiety disorder, unspecified: Secondary | ICD-10-CM

## 2022-01-27 DIAGNOSIS — F331 Major depressive disorder, recurrent, moderate: Secondary | ICD-10-CM

## 2022-01-27 NOTE — Telephone Encounter (Signed)
Patient called after missing her appointment and wanted to know what she should do because Dr Adele Schilder had left her a voicemail I asked if he wanted her to reschedule she stated that I should know what she needs to I asked if she wanted to be transferred to front to reschedule she said no I want you to transfer me to the doctor made patient aware that I could not transfer her to the doctor I could send him a message she stated that she would be on hold until I got a response. Once Dr. Adele Schilder responded I made patient aware that he wanted her to reschedule she then told me that I was a lie and she did not believe me I asked if she wanted to be transferred to reschedule she said no I asked if there was anything I could help her with she said no I disconnected the call.

## 2022-01-28 ENCOUNTER — Other Ambulatory Visit: Payer: Self-pay

## 2022-01-28 ENCOUNTER — Telehealth (HOSPITAL_BASED_OUTPATIENT_CLINIC_OR_DEPARTMENT_OTHER): Payer: 59 | Admitting: Psychiatry

## 2022-01-28 ENCOUNTER — Encounter (HOSPITAL_COMMUNITY): Payer: Self-pay | Admitting: Psychiatry

## 2022-01-28 DIAGNOSIS — F331 Major depressive disorder, recurrent, moderate: Secondary | ICD-10-CM | POA: Diagnosis not present

## 2022-01-28 DIAGNOSIS — F419 Anxiety disorder, unspecified: Secondary | ICD-10-CM

## 2022-01-28 MED ORDER — LAMOTRIGINE 200 MG PO TABS
200.0000 mg | ORAL_TABLET | Freq: Every day | ORAL | 2 refills | Status: DC
  Filled 2022-01-28 – 2022-02-14 (×2): qty 30, 30d supply, fill #0

## 2022-01-28 MED ORDER — CLONAZEPAM 0.5 MG PO TABS
0.5000 mg | ORAL_TABLET | Freq: Every day | ORAL | 2 refills | Status: DC | PRN
  Filled 2022-01-28 – 2022-02-14 (×2): qty 30, 30d supply, fill #0

## 2022-01-28 MED ORDER — HYDROXYZINE HCL 25 MG PO TABS
25.0000 mg | ORAL_TABLET | Freq: Three times a day (TID) | ORAL | 2 refills | Status: DC | PRN
  Filled 2022-01-28 – 2022-02-14 (×2): qty 90, 30d supply, fill #0

## 2022-01-28 NOTE — Progress Notes (Signed)
Virtual Visit via Telephone Note  I connected with Crystal Diaz on 01/28/22 at 10:40 AM EST by telephone and verified that I am speaking with the correct person using two identifiers.  Location: Patient: Work Provider: Biomedical scientist   I discussed the limitations, risks, security and privacy concerns of performing an evaluation and management service by telephone and the availability of in person appointments. I also discussed with the patient that there may be a patient responsible charge related to this service. The patient expressed understanding and agreed to proceed.   History of Present Illness: Patient is evaluated by phone session.  She could not do video session because she is at work.  She apologized missing appointment yesterday.  She admitted there are days when she missed taking the hydroxyzine which really helps a lot.  She is taking Klonopin almost every day before she go to work.  Patient told work is still challenging and overwhelming.  Patient continues to have a lot of psychosocial stressors.  She find out that her 34 year old son also having some behavioral issues and now she is in the process of his evaluation and trying to get appointment with therapist.  She regretted not able to see her brother in jail because she was busy.  She was hoping to get a video visit with him but system in the jail is broken.  Patient told her mother does not understand that he is facing murder charges.  He has mental health issues.  Patient told he had defender and they are trying to get insanity plea.  Patient reported Christmas was difficult because she has acute back pain and she was seen in the urgent care and given pain medication.  She noticed medicine helping her depression and she does not feel as depressed but is still have moments of irritability, frustration and anger.  She like higher dose of Lamictal.  She has no rash, itching tremors or shakes.  She continues to struggle with insomnia when  she think about her past.  She still have difficulty trusting people and she got very upset when people do not understand very well what she is trying to say.  Patient has limited support system.  Lately her fianc told some secret which is very disturbing.  Patient denies any active or passive suicidal thoughts.  Energy level is fair.  Her appetite is okay.  She denies any hallucination but reported trust issues.  She is getting therapy with [name] and in a process of getting next appointment.  Past Psychiatric History: Denies any history of suicidal attempt, inpatient treatment, psychosis, hallucination, PTSD. Tried Zoloft, Effexor and Lexapro from PCP but they were ineffective.  Psychiatric Specialty Exam: Physical Exam  Review of Systems  Weight 235 lb (106.6 kg).There is no height or weight on file to calculate BMI.  General Appearance: NA  Eye Contact:  NA  Speech:  Slow  Volume:  Decreased  Mood:  Depressed and Dysphoric  Affect:  NA  Thought Process:  Descriptions of Associations: Intact  Orientation:  Full (Time, Place, and Person)  Thought Content:  Rumination  Suicidal Thoughts:  No  Homicidal Thoughts:  No  Memory:  Immediate;   Good Recent;   Good Remote;   Good  Judgement:  Fair  Insight:  Shallow  Psychomotor Activity:  NA  Concentration:  Concentration: Fair and Attention Span: Fair  Recall:  [place] of Knowledge:  Good  Language:  Good  Akathisia:  No  Handed:  Right  AIMS (if indicated):     Assets:  Communication Skills Desire for Improvement Housing Talents/Skills Transportation  ADL's:  Intact  Cognition:  WNL  Sleep:   fair      Assessment and Plan: Anxiety.  Major depressive disorder, recurrent.  Discussed psychosocial stressors which are chronic but have flares up.  Now she has to deal with her finances secret which are very disturbing.  Patient like increased dose of Lamictal.  I review current medication.  She is no longer taking  narcotics, lidocaine patches, Celebrex but is still taking Flexeril and tramadol.  She is in the process of getting her 40 year old child to be evaluated.  She understand compliance with the medication and therapy is very crucial and important.  She will make sure that she keeps the medicine compliance and see a therapist.  Continue Lamictal 200 mg daily, Klonopin 0.5 mg to take as needed which she usually go before go to work and hydroxyzine up to 3 times a day.  I encouraged to verbalize her stress with the therapist.  She has no rash or any itching.  Recommend to call us back if she has any question or any concern.  We will follow-up in 3 months this time.  However I encourage if she feels worsening of the symptoms then she can call us earlier.  Follow Up Instructions:    I discussed the assessment and treatment plan with the patient. The patient was provided an opportunity to ask questions and all were answered. The patient agreed with the plan and demonstrated an understanding of the instructions.   The patient was advised to call back or seek an in-person evaluation if the symptoms worsen or if the condition fails to improve as anticipated.  Collaboration of Care: Other provider involved in patient's care AEB notes are available in epic to review.  Patient/Guardian was advised Release of Information must be obtained prior to any record release in order to collaborate their care with an outside provider. Patient/Guardian was advised if they have not already done so to contact the registration department to sign all necessary forms in order for [place] to release information regarding their care.   Consent: Patient/Guardian gives verbal consent for treatment and assignment of benefits for services provided during this visit. Patient/Guardian expressed understanding and agreed to proceed.    I provided 22 minutes of non-face-to-face time during this encounter.   Kathlee Nations, MD

## 2022-01-29 ENCOUNTER — Other Ambulatory Visit: Payer: Self-pay

## 2022-01-29 MED ORDER — DIAZEPAM 5 MG PO TABS
10.0000 mg | ORAL_TABLET | ORAL | 0 refills | Status: DC
  Filled 2022-01-29: qty 2, 1d supply, fill #0

## 2022-01-31 ENCOUNTER — Other Ambulatory Visit: Payer: Self-pay

## 2022-01-31 ENCOUNTER — Ambulatory Visit: Payer: 59 | Admitting: Psychology

## 2022-01-31 DIAGNOSIS — M5416 Radiculopathy, lumbar region: Secondary | ICD-10-CM | POA: Diagnosis not present

## 2022-02-14 ENCOUNTER — Other Ambulatory Visit: Payer: Self-pay

## 2022-02-17 ENCOUNTER — Other Ambulatory Visit: Payer: Self-pay

## 2022-02-18 ENCOUNTER — Ambulatory Visit (INDEPENDENT_AMBULATORY_CARE_PROVIDER_SITE_OTHER): Payer: 59 | Admitting: Primary Care

## 2022-02-18 ENCOUNTER — Encounter: Payer: Self-pay | Admitting: Primary Care

## 2022-02-18 ENCOUNTER — Telehealth: Payer: Self-pay | Admitting: Primary Care

## 2022-02-18 VITALS — BP 128/84 | HR 104 | Temp 98.4°F | Ht 69.0 in | Wt 251.0 lb

## 2022-02-18 DIAGNOSIS — F411 Generalized anxiety disorder: Secondary | ICD-10-CM

## 2022-02-18 DIAGNOSIS — F331 Major depressive disorder, recurrent, moderate: Secondary | ICD-10-CM

## 2022-02-18 NOTE — Telephone Encounter (Signed)
Please call Lanny Hurst Cottle's office regarding this patient. I would like to speak with him personally if possible. If not, then I can leave a message with his nurse.

## 2022-02-18 NOTE — Assessment & Plan Note (Signed)
Uncontrolled.  Reviewed psychiatry office notes from January 2024. I strongly advised she reach back out to her psychiatrist as her regimen is clearly not effective. I will cc my notes to him as well.   I will reach out to her therapist recommended more frequent sessions. I offered a medical leave of absence, she will think about this.

## 2022-02-18 NOTE — Patient Instructions (Signed)
Please get in touch with your psychiatrist. I will try to get in touch with your therapist.   It was a pleasure to see you today!

## 2022-02-18 NOTE — Telephone Encounter (Signed)
Called and spoke with receptionist at [facility], I advised Crystal Diaz needs to speak directly with Crystal Diaz in regards to this patient if at all possible, if not she needs to speak with nurse. She advised she would send him a direct message with Crystal Diaz direct number and advise the phone call needs to be returned as soon as possible. She marked the message as STAT.

## 2022-02-18 NOTE — Assessment & Plan Note (Signed)
Uncontrolled.  Reviewed psychiatry office notes from January 2024. I strongly advised she reach back out to her psychiatrist as her regimen is clearly not effective. I will cc my notes to him as well.   I will reach out to her therapist recommended more frequent sessions. I offered a medical leave of absence, she will think about this.   Continue Lamictal 200 mg daily, Clonazepam 0.5 mg PRN, and hydroxyzine PRN.

## 2022-02-18 NOTE — Telephone Encounter (Signed)
Spoke with Mr. Crystal Diaz regarding my concern for the patient. He the patient will be meeting later this week.

## 2022-02-18 NOTE — Progress Notes (Signed)
Subjective:    Patient ID: Crystal Diaz, female    DOB: [birth year], 32 y.o.   MRN: [id number]  Anxiety Symptoms include nervous/anxious behavior. Patient reports no chest pain, shortness of breath or suicidal ideas.      Crystal Diaz is a very pleasant 32 y.o. female with a history of MDD, GAD, s/p gastric bypass, GERD, vitamin D deficiency who presents today to discuss anxiety.  Following with psychiatry for MDD and GAD, last visit was in mid January 2024. Currently managed on Lamictal 200 mg daily, clonazepam PRN, hydroxyzine TID PRN.   She continues to experience irritability, anxiety, is easily angered and frustrated. She is frustrated today, is anxious about not being at work. She has become verbally frustrated in several scenarios when speaking with others on the phone and is always regretful of what she says. Also feeling little motivation to do much.   She's under a lot of stress in her personal life with her fiance. Her son isn't doing well in school and is getting in fights. She has little emotional strength to be a good parent, wants to do better but is struggling herself.   She has discussed her symptoms with her psychiatrist who feels that her symptoms are mood related. She meets with her therapist once monthly which helps temporarily. She completed a week of intense outpatient therapy in October 2023, felt improved temporarily.   She isn't sure where to go or what to do next, she feels that her symptoms are uncontrolled. She denies SI/HI. She is compliant to vitamin D and B12, is doing better about regular compliance.    Review of Systems  Respiratory:  Negative for shortness of breath.   Cardiovascular:  Negative for chest pain.  Psychiatric/Behavioral:  Positive for agitation. Negative for suicidal ideas. The patient is nervous/anxious.        See HPI         Past Medical History:  Diagnosis Date   Anemia    Anxiety    GERD (gastroesophageal reflux  disease)    Headache    Lumbar radiculopathy    PCOS (polycystic ovarian syndrome)    Plantar fasciitis, bilateral     Social History   Socioeconomic History   Marital status: Single    Spouse name: Not on file   Number of children: Not on file   Years of education: Not on file   Highest education level: Not on file  Occupational History   Not on file  Tobacco Use   Smoking status: Former    Packs/day: 0.25    Years: 4.00    Total pack years: 1.00    Types: Cigarettes    Quit date: 01/19/2017    Years since quitting: 5.0   Smokeless tobacco: Never  Vaping Use   Vaping Use: Never used  Substance and Sexual Activity   Alcohol use: Yes    Alcohol/week: 0.0 standard drinks of alcohol    Comment: occ   Drug use: No   Sexual activity: Yes    Partners: Male    Birth control/protection: Pill  Other Topics Concern   Not on file  Social History Narrative   Single. In a relationship.    One son.   Works as Biomedical engineer.    Enjoys spending time with her son, traveling.   Social Determinants of Health   Financial Resource Strain: Not on file  Food Insecurity: Not on file  Transportation Needs: Not on file  Physical Activity:  Not on file  Stress: Not on file  Social Connections: Not on file  Intimate Partner Violence: Not on file    Past Surgical History:  Procedure Laterality Date   FOOT SURGERY Right    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   Procedure: LAPAROSCOPIC ROUX-EN-Y GASTRIC BYPASS WITH HIATAL HERNIA REPAIR AND UPPER ENDOSCOPY;  Surgeon: Johnathan Hausen, MD;  Location: [facility];  Service: General;  Laterality: N/A;   WISDOM TOOTH EXTRACTION      Family History  Problem Relation Age of Onset   Diabetes Mother    Pancreatic cancer Father 79   Cancer Brother        Oral   Mental illness Brother     No Known Allergies  Current Outpatient Medications on File Prior to Visit  Medication Sig Dispense Refill   clonazePAM (KLONOPIN) 0.5 MG tablet Take 1 tablet  (0.5 mg total) by mouth daily as needed for anxiety 30 tablet 2   cyanocobalamin (VITAMIN B12) 1000 MCG/ML injection Inject 1 mL (1,000 mcg total) into the muscle every 30 (thirty) days. 3 mL 1   cyclobenzaprine (FLEXERIL) 5 MG tablet Take 1 tablet (5 mg total) by mouth 2 (two) times daily. 60 tablet 0   diazepam (VALIUM) 5 MG tablet Take 2 tablets (10 mg total) by mouth 30-60 minutes prior to procedure 2 tablet 0   hydrOXYzine (ATARAX) 25 MG tablet Take 1 tablet (25 mg total) by mouth 3 (three) times daily as needed for anxiety (insomnia). 90 tablet 2   lamoTRIgine (LAMICTAL) 200 MG tablet Take 1 tablet (200 mg total) by mouth daily. 30 tablet 2   pantoprazole (PROTONIX) 40 MG tablet Take 1 tablet (40 mg total) by mouth once daily 90 tablet 3   SYRINGE-NEEDLE, DISP, 3 ML (B-D 3CC LUER-LOK SYR 23GX1") 23G X 1" 3 ML MISC Use once monthly with vitamin B12 as directed 6 each 0   traMADol (ULTRAM) 50 MG tablet Take 1 tablet (50 mg total) by mouth every 6 (six) hours. 30 tablet 0   albuterol (VENTOLIN HFA) 108 (90 Base) MCG/ACT inhaler Inhale 1-2 puffs into the lungs every 6 (six) hours as needed for wheezing or shortness of breath. 6.7 g 0   desogestrel-ethinyl estradiol (MIRCETTE) 0.15-0.02/0.01 MG (21/5) tablet Take 1 tablet by mouth daily. (Patient not taking: Reported on 02/18/2022) 84 tablet 2   Vitamin D, Ergocalciferol, (DRISDOL) 1.25 MG (50000 UNIT) CAPS capsule Take 1 capsule (50,000 Units total) by mouth once a week for 12 weeks (Patient not taking: Reported on 02/18/2022) 12 capsule 0   No current facility-administered medications on file prior to visit.    BP 128/84   Pulse (!) 104   Temp 98.4 F (36.9 C) (Temporal)   Ht 5\' 9"  (1.753 m)   Wt 251 lb (113.9 kg)   SpO2 98%   BMI 37.07 kg/m  Objective:   Physical Exam Cardiovascular:     Rate and Rhythm: Normal rate and regular rhythm.  Pulmonary:     Effort: Pulmonary effort is normal.     Breath sounds: Normal breath sounds.   Musculoskeletal:     Cervical back: Neck supple.  Skin:    General: Skin is warm and dry.  Psychiatric:     Comments: Tearful during visit           Assessment & Plan:  GAD (generalized anxiety disorder) Assessment & Plan: Uncontrolled.  Reviewed psychiatry office notes from January 2024. I strongly advised she reach back out to her  psychiatrist as her regimen is clearly not effective. I will cc my notes to him as well.   I will reach out to her therapist recommended more frequent sessions. I offered a medical leave of absence, she will think about this.    Moderate episode of recurrent major depressive disorder ([place]) Assessment & Plan: Uncontrolled.  Reviewed psychiatry office notes from January 2024. I strongly advised she reach back out to her psychiatrist as her regimen is clearly not effective. I will cc my notes to him as well.   I will reach out to her therapist recommended more frequent sessions. I offered a medical leave of absence, she will think about this.   Continue Lamictal 200 mg daily, Clonazepam 0.5 mg PRN, and hydroxyzine PRN.         Pleas Koch, NP

## 2022-02-21 ENCOUNTER — Ambulatory Visit: Payer: 59 | Admitting: Family Medicine

## 2022-02-21 ENCOUNTER — Ambulatory Visit (INDEPENDENT_AMBULATORY_CARE_PROVIDER_SITE_OTHER): Payer: 59 | Admitting: Psychology

## 2022-02-21 DIAGNOSIS — F339 Major depressive disorder, recurrent, unspecified: Secondary | ICD-10-CM | POA: Diagnosis not present

## 2022-02-21 DIAGNOSIS — M5416 Radiculopathy, lumbar region: Secondary | ICD-10-CM | POA: Diagnosis not present
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
## 2022-02-25 ENCOUNTER — Other Ambulatory Visit: Payer: Self-pay | Admitting: Orthopedic Surgery

## 2022-02-25 ENCOUNTER — Encounter (HOSPITAL_COMMUNITY): Payer: Self-pay | Admitting: Psychiatry

## 2022-02-25 ENCOUNTER — Telehealth (HOSPITAL_BASED_OUTPATIENT_CLINIC_OR_DEPARTMENT_OTHER): Payer: 59 | Admitting: Psychiatry

## 2022-02-25 ENCOUNTER — Other Ambulatory Visit: Payer: Self-pay

## 2022-02-25 DIAGNOSIS — M5416 Radiculopathy, lumbar region: Secondary | ICD-10-CM

## 2022-02-25 DIAGNOSIS — F331 Major depressive disorder, recurrent, moderate: Secondary | ICD-10-CM

## 2022-02-25 DIAGNOSIS — F419 Anxiety disorder, unspecified: Secondary | ICD-10-CM

## 2022-02-25 MED ORDER — LAMOTRIGINE 100 MG PO TABS
250.0000 mg | ORAL_TABLET | Freq: Every day | ORAL | 0 refills | Status: DC
  Filled 2022-02-25: qty 75, 30d supply, fill #0

## 2022-02-25 MED ORDER — CLONAZEPAM 0.5 MG PO TABS
0.5000 mg | ORAL_TABLET | Freq: Two times a day (BID) | ORAL | 0 refills | Status: DC | PRN
  Filled 2022-02-25 – 2022-03-14 (×2): qty 60, 30d supply, fill #0

## 2022-02-25 MED ORDER — HYDROXYZINE HCL 25 MG PO TABS
25.0000 mg | ORAL_TABLET | Freq: Three times a day (TID) | ORAL | 0 refills | Status: DC | PRN
  Filled 2022-02-25: qty 90, 30d supply, fill #0
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
## 2022-02-26 ENCOUNTER — Other Ambulatory Visit: Payer: Self-pay

## 2022-02-26 ENCOUNTER — Ambulatory Visit
Admission: RE | Admit: 2022-02-26 | Discharge: 2022-02-26 | Disposition: A | Discharge status: Home / Self Care | Payer: 59 | Source: Ambulatory Visit | Attending: Orthopedic Surgery | Admitting: Orthopedic Surgery

## 2022-02-26 DIAGNOSIS — M5416 Radiculopathy, lumbar region: Secondary | ICD-10-CM | POA: Insufficient documentation

## 2022-02-26 DIAGNOSIS — M545 Low back pain, unspecified: Secondary | ICD-10-CM | POA: Diagnosis not present

## 2022-02-26 DIAGNOSIS — R200 Anesthesia of skin: Secondary | ICD-10-CM | POA: Diagnosis not present

## 2022-02-26 IMAGING — US US PELVIS COMPLETE
1 series · 13 of 25 positions shown · non-contrast
Comparison: Pelvic ultrasound December 09, 2019
COMPARISON: Pelvic ultrasound December 09, 2019

Addendum:
CLINICAL DATA: Follow-up right ovarian cyst.

EXAM:
TRANSABDOMINAL AND TRANSVAGINAL ULTRASOUND OF PELVIS
TECHNIQUE: Both transabdominal and transvaginal ultrasound examinations of the
pelvis were performed. Transabdominal technique was performed for
global imaging of the pelvis including uterus, ovaries, adnexal
regions, and pelvic cul-de-sac. It was necessary to proceed with
endovaginal exam following the transabdominal exam to visualize the
endometrium and ovaries.

[Series 1: us pelvis complete · 13 of 121 slices shown]
[im 1/121]
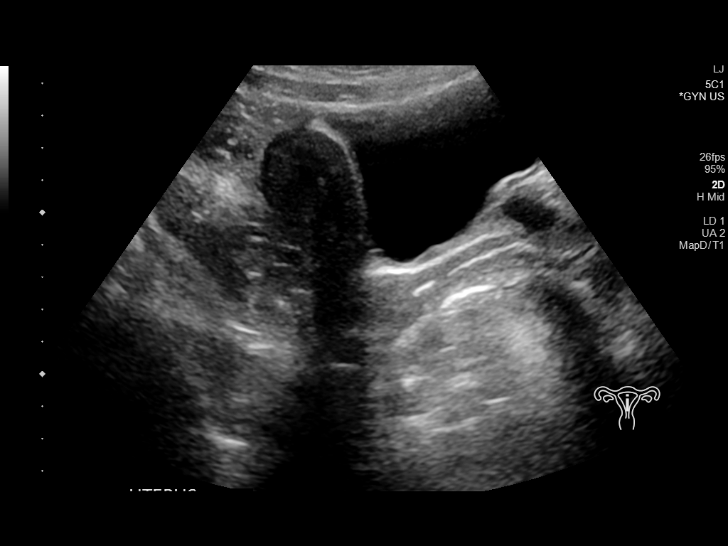
[im 11/121]
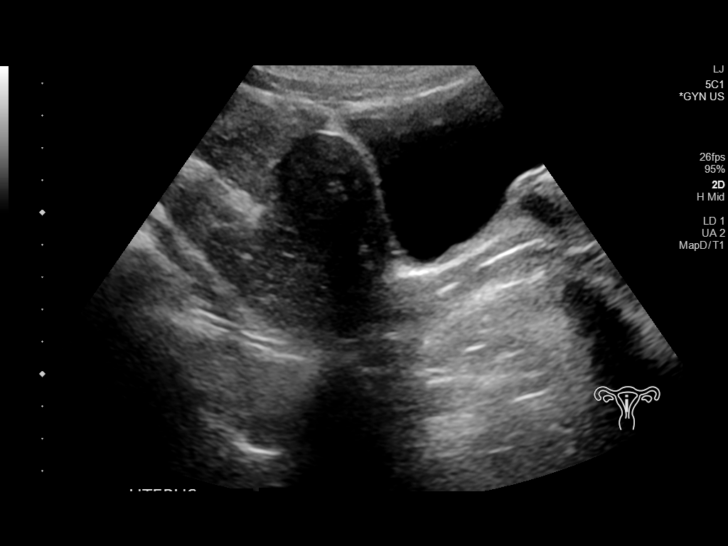
[im 21/121]
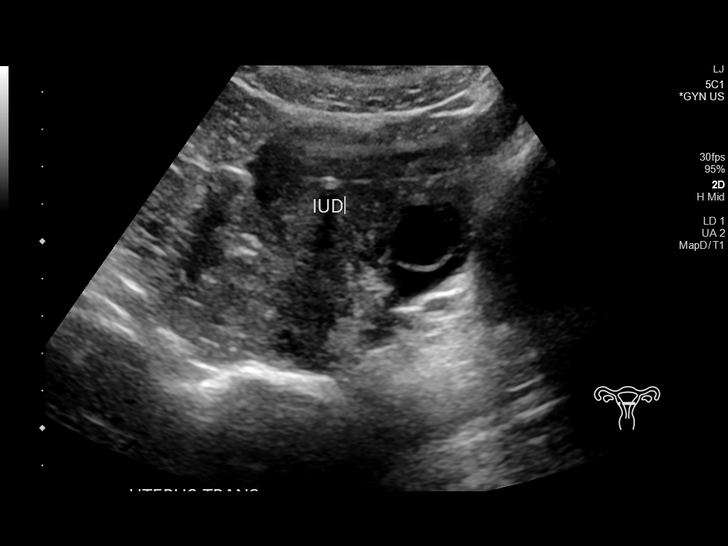
[im 31/121]
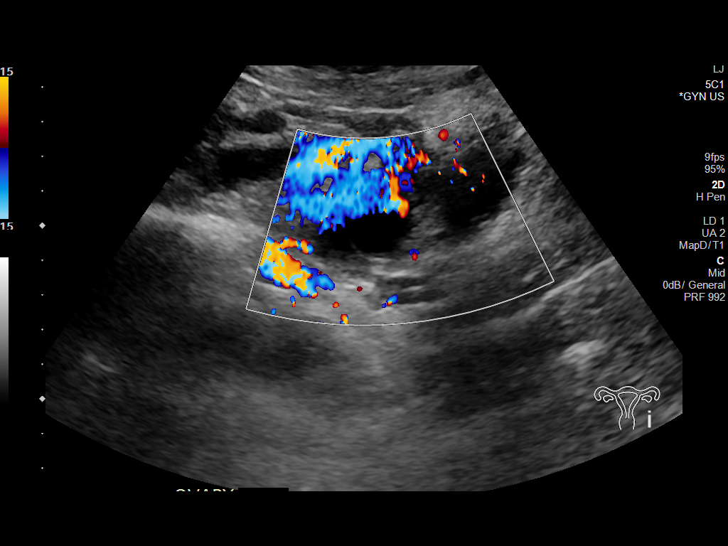
[im 41/121]
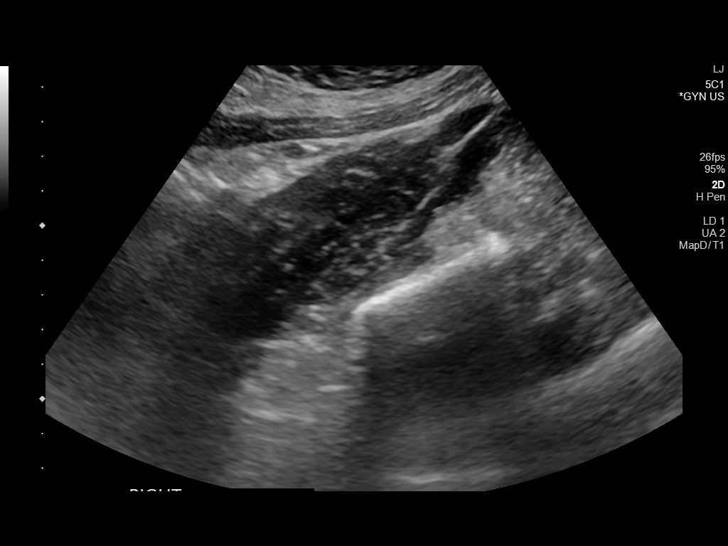
[im 51/121]
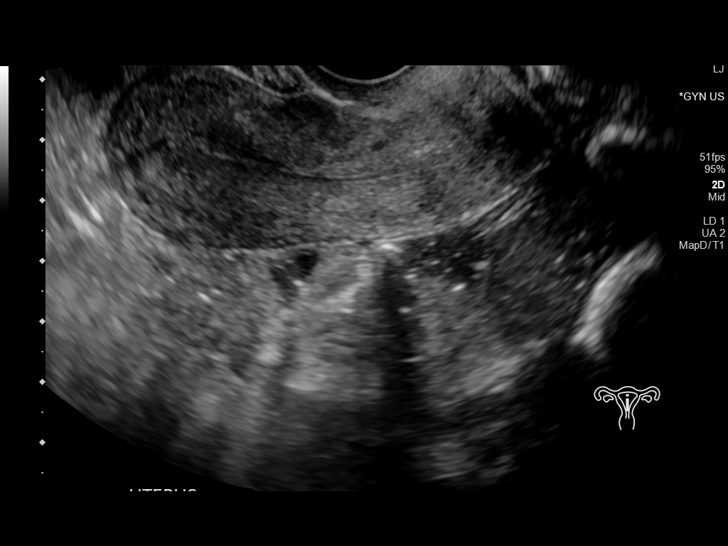
[im 61/121]
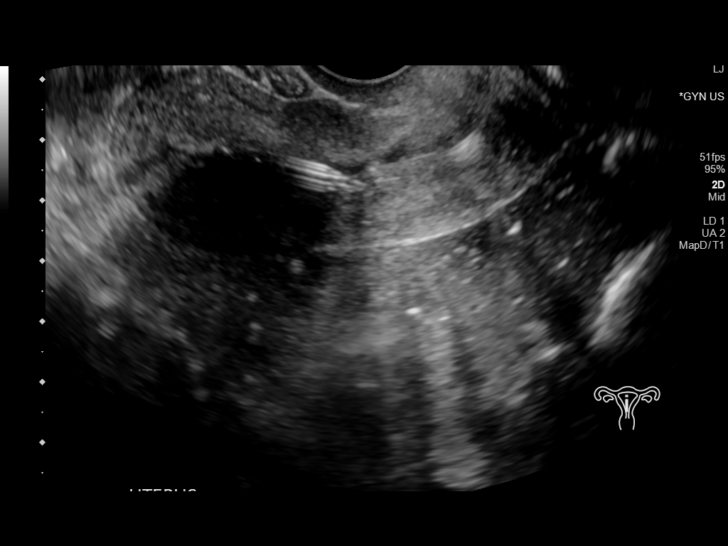
[im 71/121]
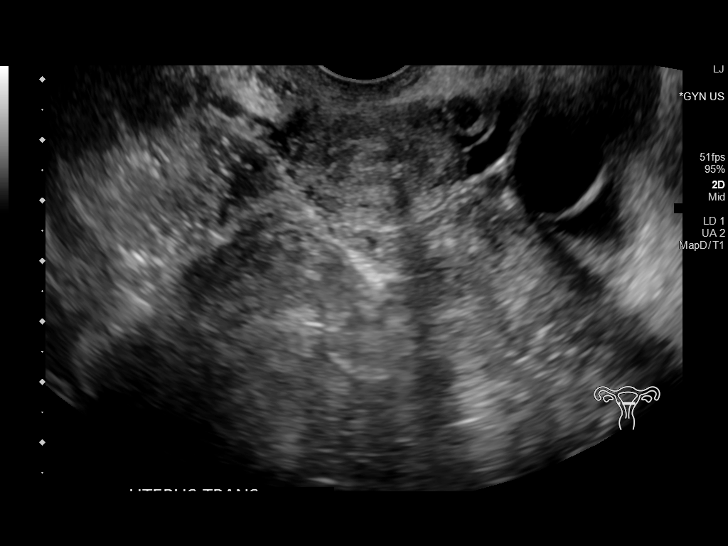
[im 81/121]
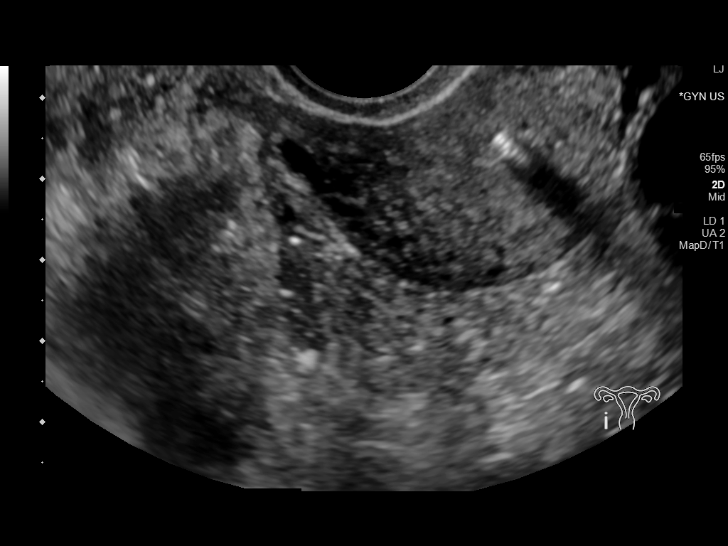
[im 91/121]
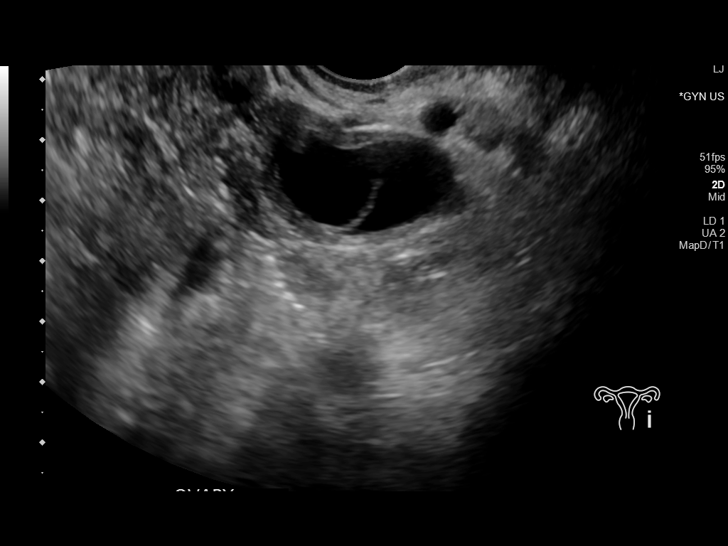
[im 101/121]
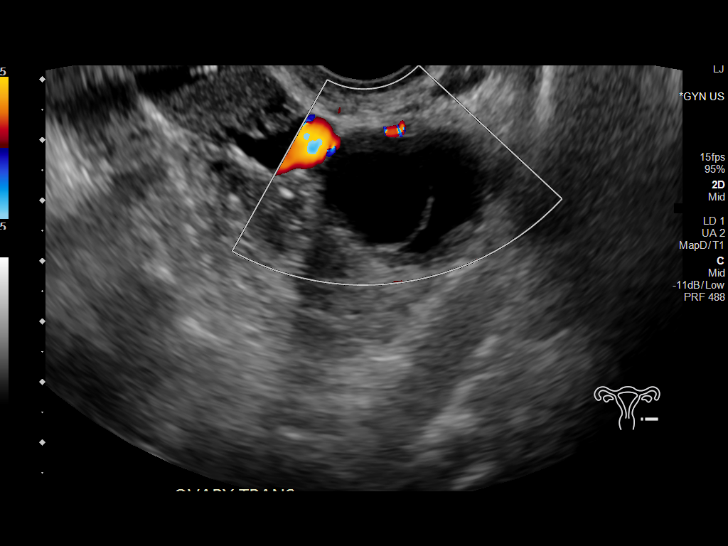
[im 111/121]
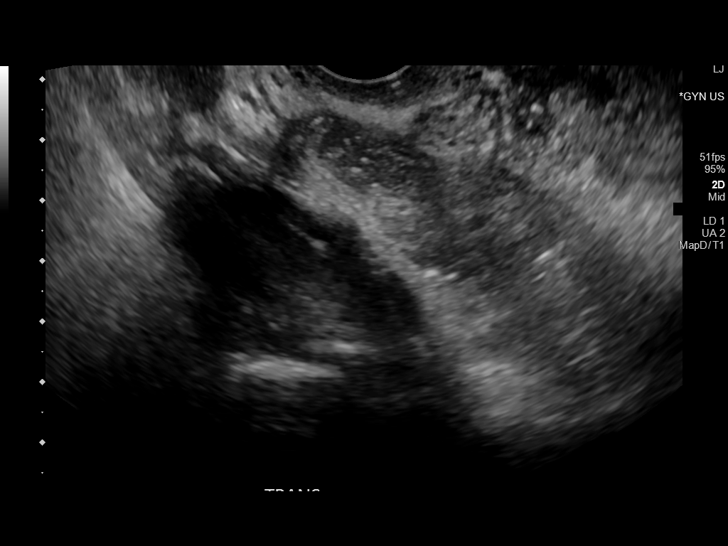
[im 121/121]
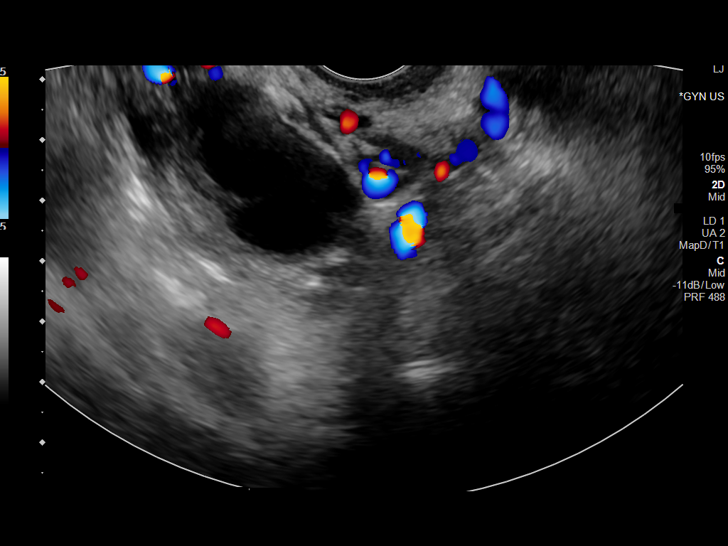

[13 of 25 positions shown; findings below may reference images not displayed]

FINDINGS: Uterus

Measurements: 7.9 x 3.1 x 4.2 cm = volume: 53.7 mL. No fibroids or
other mass visualized.

Endometrium

Thickness: 4 mm.  No focal abnormality visualized.

Right ovary

Measurements: 2.6 x 1.2 x 1.4 cm = volume: 2.3 mL. No cyst or mass
identified.

Left ovary

Measurements: 3.6 x 2.1 x 3.2 cm = volume: 12.5 mL. No suspicious
masses.

Other findings

No abnormal free fluid.
IMPRESSION: The previously identified right ovarian cyst has resolved. No
suspicious ovarian or adnexal masses today. No other abnormalities.

ADDENDUM:
The IUD is appropriately placed in the endometrium extending from
the fundus into the body.

*** End of Addendum ***
FINDINGS: Uterus

Measurements: 7.9 x 3.1 x 4.2 cm = volume: 53.7 mL. No fibroids or
other mass visualized.

Endometrium

Thickness: 4 mm.  No focal abnormality visualized.

Right ovary

Measurements: 2.6 x 1.2 x 1.4 cm = volume: 2.3 mL. No cyst or mass
identified.

Left ovary

Measurements: 3.6 x 2.1 x 3.2 cm = volume: 12.5 mL. No suspicious
masses.

Other findings

No abnormal free fluid.
IMPRESSION: The previously identified right ovarian cyst has resolved. No
suspicious ovarian or adnexal masses today. No other abnormalities.

## 2022-02-26 MED ORDER — LORAZEPAM 2 MG PO TABS
ORAL_TABLET | ORAL | 0 refills | Status: DC
  Filled 2022-02-26: qty 2, 1d supply, fill #0

## 2022-02-28 ENCOUNTER — Ambulatory Visit: Payer: 59 | Admitting: Psychology

## 2022-03-07 ENCOUNTER — Ambulatory Visit: Payer: 59 | Admitting: Psychology

## 2022-03-07 DIAGNOSIS — H52223 Regular astigmatism, bilateral: Secondary | ICD-10-CM | POA: Diagnosis not present

## 2022-03-12 ENCOUNTER — Other Ambulatory Visit: Payer: Self-pay

## 2022-03-12 MED ORDER — TRAMADOL HCL 50 MG PO TABS
50.0000 mg | ORAL_TABLET | Freq: Four times a day (QID) | ORAL | 0 refills | Status: DC
  Filled 2022-03-12: qty 30, 8d supply, fill #0

## 2022-03-12 MED ORDER — CYCLOBENZAPRINE HCL 5 MG PO TABS
5.0000 mg | ORAL_TABLET | Freq: Two times a day (BID) | ORAL | 0 refills | Status: DC
  Filled 2022-03-12: qty 60, 30d supply, fill #0

## 2022-03-14 ENCOUNTER — Other Ambulatory Visit: Payer: Self-pay

## 2022-03-14 ENCOUNTER — Ambulatory Visit: Payer: 59 | Admitting: Psychology

## 2022-03-20 ENCOUNTER — Telehealth (HOSPITAL_COMMUNITY): Payer: Self-pay | Admitting: *Deleted

## 2022-03-20 NOTE — Telephone Encounter (Signed)
I returned patient's phone call.  Patient wants to know that primary care doctor wants to try Effexor.  I explained she had tried Lexapro in the past which is in her record but they were ineffective.  She can try again with low-dose but keep in mind if it changes her mood and made her more irritable then she need to stop the medication.  Patient will discuss with her PCP.  I recommend have her PCP look into my progress notes about past medication trial..  Patient had appointment end of this month and she like to keep that appointment with this Probation officer.

## 2022-03-20 NOTE — Telephone Encounter (Signed)
Pt called stating that she needs to speak with you regarding medication from PCP. Her cell # is [phone]. Thank you.

## 2022-03-21 ENCOUNTER — Other Ambulatory Visit: Payer: Self-pay

## 2022-03-21 ENCOUNTER — Ambulatory Visit: Payer: 59

## 2022-03-21 NOTE — Progress Notes (Deleted)
Vaginal/Vulvar Complaints -  Assess and document - Patient states on *** experiencing {vaginalconcerns:28508}  {Actions; denies/reports/admits to:19208} UTI symptoms. If yes, {UTI Symptoms:210800002}  {Actions; denies/reports/admits to:19208} to the use of new soaps, detergents, scented pads, or bubble bath    Plan -   Patient may be seen for nurse visit for suspected yeast infection, BV, STD testing or UTI symptoms.   Only use dove sensitive soap, no deodorant pads or tampons, fragrance free detergent   If suspect yeast infection or known yeast infection - use Monistat 7 over the counter (no 3 day monistat).   May give Diflucan '150mg'$  one time dose. One refill allowed. If no relief, see provider on day 9. Nothing in vaginal night before visit with provider.   Foul odor - needs to see provider   If known bacterial vaginosis - send in prescription for Flagyl 500 mg twice daily for 7 days or metrogel vaginal gel [id number] 1 applicator full QHS for 5 nights. If no relief, needs to see provider   Blisters/lesions (HSV)- need to see provider if first outbreak   if recurrent HSV send in prescription (provided we have prescribed before) for - Valtrex '500mg'$  PO every 12 hours for 3 days (start with 24-hour symptom onset), or Valtrex 1000 mg PO daily for 5 days or Acyclovir 400 mg PO three times day (start with 24-hour symptom onset), or Acyclovir 800 mg PO twice daily for 5 days.   Treatment for HSV is same for OB or GYN patients.   Possible STD - schedule appointment with provider.

## 2022-03-26 ENCOUNTER — Telehealth (HOSPITAL_COMMUNITY): Payer: 59 | Admitting: Psychiatry

## 2022-03-28 ENCOUNTER — Ambulatory Visit (INDEPENDENT_AMBULATORY_CARE_PROVIDER_SITE_OTHER): Payer: 59

## 2022-03-28 ENCOUNTER — Other Ambulatory Visit (HOSPITAL_COMMUNITY)
Admission: RE | Admit: 2022-03-28 | Discharge: 2022-03-28 | Disposition: A | Discharge status: Home / Self Care | Payer: 59 | Source: Ambulatory Visit | Attending: Obstetrics and Gynecology | Admitting: Obstetrics and Gynecology

## 2022-03-28 VITALS — BP 106/68 | HR 90 | Ht 69.0 in | Wt 251.0 lb

## 2022-03-28 DIAGNOSIS — N898 Other specified noninflammatory disorders of vagina: Secondary | ICD-10-CM | POA: Diagnosis not present

## 2022-03-28 DIAGNOSIS — M545 Low back pain, unspecified: Secondary | ICD-10-CM | POA: Diagnosis not present

## 2022-03-28 LAB — POCT URINALYSIS DIPSTICK
Bilirubin, UA: NEGATIVE
Blood, UA: NEGATIVE
Glucose, UA: NEGATIVE
Ketones, UA: NEGATIVE
Leukocytes, UA: NEGATIVE
Nitrite, UA: NEGATIVE
Protein, UA: NEGATIVE
Spec Grav, UA: 1.025 (ref 1.010–1.025)
Urobilinogen, UA: 0.2 E.U./dL
pH, UA: 7.5 (ref 5.0–8.0)

## 2022-03-28 NOTE — Progress Notes (Signed)
    NURSE VISIT NOTE  Subjective:    Patient ID: Crystal Diaz, female    DOB: 12/22/1990, 32 y.o.   MRN: [id number]  HPI  Patient is a 32 y.o. [id number] female who presents for malodorous clear discharge for a couple month(s).She  Denies abnormal vaginal bleeding or significant pelvic pain or fever. admits to  back pain, frequency and dark urine.  . Patient denies history of known exposure to STD.   Objective:    BP 106/68   Pulse 90   Ht 5\' 9"  (1.753 m)   Wt 251 lb (113.9 kg)   BMI 37.07 kg/m      Assessment:   1. Vaginal odor   2. Low back pain, unspecified back pain laterality, unspecified chronicity, unspecified whether sciatica present       Plan:   GC and chlamydia DNA  probe sent to lab. Urine sent to Lab. ROV prn if symptoms persist or worsen.   Landis Gandy, [place]

## 2022-03-30 LAB — URINE CULTURE

## 2022-04-01 LAB — CERVICOVAGINAL ANCILLARY ONLY
Bacterial Vaginitis (gardnerella): NEGATIVE
Candida Glabrata: NEGATIVE
Candida Vaginitis: NEGATIVE
Chlamydia: NEGATIVE
Comment: NEGATIVE
Comment: NEGATIVE
Comment: NEGATIVE
Comment: NEGATIVE
Comment: NEGATIVE
Comment: NORMAL
Neisseria Gonorrhea: NEGATIVE
Trichomonas: NEGATIVE

## 2022-04-02 ENCOUNTER — Encounter: Payer: Self-pay | Admitting: Obstetrics and Gynecology

## 2022-04-08 ENCOUNTER — Other Ambulatory Visit (HOSPITAL_COMMUNITY): Payer: Self-pay | Admitting: Psychiatry

## 2022-04-08 ENCOUNTER — Other Ambulatory Visit: Payer: Self-pay

## 2022-04-08 DIAGNOSIS — F331 Major depressive disorder, recurrent, moderate: Secondary | ICD-10-CM

## 2022-04-08 DIAGNOSIS — F419 Anxiety disorder, unspecified: Secondary | ICD-10-CM

## 2022-04-10 ENCOUNTER — Ambulatory Visit (HOSPITAL_BASED_OUTPATIENT_CLINIC_OR_DEPARTMENT_OTHER): Payer: 59 | Admitting: Psychiatry

## 2022-04-10 ENCOUNTER — Other Ambulatory Visit: Payer: Self-pay

## 2022-04-10 DIAGNOSIS — F419 Anxiety disorder, unspecified: Secondary | ICD-10-CM | POA: Diagnosis not present

## 2022-04-10 DIAGNOSIS — F331 Major depressive disorder, recurrent, moderate: Secondary | ICD-10-CM | POA: Diagnosis not present

## 2022-04-10 MED ORDER — CLONAZEPAM 0.5 MG PO TABS
0.5000 mg | ORAL_TABLET | Freq: Two times a day (BID) | ORAL | 1 refills | Status: DC | PRN
  Filled 2022-04-10 – 2022-05-15 (×2): qty 60, 30d supply, fill #0

## 2022-04-10 MED ORDER — LAMOTRIGINE 150 MG PO TABS
300.0000 mg | ORAL_TABLET | Freq: Every day | ORAL | 1 refills | Status: DC
  Filled 2022-04-10: qty 60, 30d supply, fill #0
  Filled 2022-04-28 (×2): qty 30, 15d supply, fill #0

## 2022-04-10 MED ORDER — HYDROXYZINE HCL 25 MG PO TABS
25.0000 mg | ORAL_TABLET | Freq: Three times a day (TID) | ORAL | 0 refills | Status: DC | PRN
  Filled 2022-04-10: qty 90, 30d supply, fill #0
  Filled 2022-04-28: qty 65, 22d supply, fill #0
  Filled 2022-04-28: qty 25, 8d supply, fill #0
  Filled 2022-04-28: qty 65, 22d supply, fill #0

## 2022-04-10 NOTE — Progress Notes (Signed)
[facility] MD/PA/NP OP Progress Note   Patient Location: Office Provider Location: Office  04/10/2022 4:16 PM Crystal Diaz  MRN:  [id number]  Chief Complaint:  Chief Complaint  Patient presents with   Follow-up   HPI: Patient came for her follow-up appointment.  She continues to have ruminative thoughts, paranoia, trust issue and psychosocial issues.  Her 34 year old son has been not doing very well and she worried about him.  Patient told he is not drinking progress and sometime son makes suicidal gesture.  She is working closely with her son's therapist but sometimes she gets disappointed.  When asked about the medication she recalled taking Lamictal only 100 mg.  She apologized not looking the instruction of medication clearly and rather than taking 250 mg she is just taking 100 mg.  She admitted lately feeling more sad depressed.  Her paranoia is getting worse.  She does not go outside.  She reported not a good relationship with her boyfriend after lied about his past and criminal history.  She had changed the locks.  She is in therapy with [name] but realized that she may need more intensive therapy and she is going to talk to her son's therapist if she can switch and see someone there.  She admitted does not like to go outside.  She has passive and fleeting suicidal thoughts which are chronic but not any plan or any intent to do things.  Recently she started to have worsening of back pain and she is going to have MRI very soon.  She was given Ativan so she can take before the MRI because she gets very nervous and anxious before the test.  She has no rash or any itching.  Her job is challenging but she is going on a regular basis.  She is close to her brother's ex-wife.  Her brother is in jail and sometimes she has difficulty getting visits.  She has no rash or any itching.  Her appetite is fair.  She feels tired and when she comes home she does want to lie down.  She admitted drinking  occasionally on the weekend but denies any binge or any intoxication.  She denies any illegal substance use.  Visit Diagnosis:    ICD-10-CM   1. Anxiety  F41.9 clonazePAM (KLONOPIN) 0.5 MG tablet    hydrOXYzine (ATARAX) 25 MG tablet    lamoTRIgine (LAMICTAL) 150 MG tablet    2. MDD (major depressive disorder), recurrent episode, moderate (HCC)  F33.1 clonazePAM (KLONOPIN) 0.5 MG tablet    hydrOXYzine (ATARAX) 25 MG tablet    lamoTRIgine (LAMICTAL) 150 MG tablet      Past Psychiatric History:  Denies any history of suicidal attempt, inpatient treatment, psychosis, hallucination, PTSD. Tried Zoloft, Effexor and Lexapro from PCP but they were ineffective.   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  Family History:  Family History  Problem Relation Age of Onset   Diabetes Mother    Pancreatic cancer Father 30   Cancer Brother        Oral   Mental illness Brother     Social History:  Social History   Socioeconomic History   Marital status: Single    Spouse name: Not on file   Number of children: Not on file   Years of education: Not on file   Highest education level: Not on file  Occupational History   Not on file  Tobacco Use   Smoking status: Former    Packs/day: 0.25    Years: 4.00    Additional pack years: 0.00    Total pack years: 1.00    Types: Cigarettes    Quit date: 01/19/2017    Years since quitting: 5.2    Smokeless tobacco: Never  Vaping Use   Vaping Use: Never used  Substance and Sexual Activity   Alcohol use: Yes    Alcohol/week: 0.0 standard drinks of alcohol    Comment: occ   Drug use: No   Sexual activity: Yes    Partners: Male    Birth control/protection: Pill  Other Topics Concern   Not on file  Social History Narrative   Single. In a relationship.    One son.   Works as Biomedical engineer.    Enjoys spending time with her son, traveling.   Social Determinants of Health   Financial Resource Strain: Not on file  Food Insecurity: Not on file  Transportation Needs: Not on file  Physical Activity: Not on file  Stress: Not on file  Social Connections: Not on file    Allergies: No Known Allergies  Metabolic Disorder Labs: No results found for: "HGBA1C", "MPG" No results found for: "PROLACTIN" Lab Results  Component Value Date   CHOL 129 09/06/2021   TRIG 65.0 09/06/2021   HDL 54.40 09/06/2021   CHOLHDL 2 09/06/2021   VLDL 13.0 09/06/2021   LDLCALC 62 09/06/2021   LDLCALC 66 06/08/2020   Lab Results  Component Value Date   TSH 1.08 09/06/2021   TSH 0.47 02/16/2019    Therapeutic Level Labs: No results found for: "LITHIUM" No results found for: "VALPROATE" No results found for: "CBMZ"  Current Medications: Current Outpatient Medications  Medication Sig Dispense Refill   clonazePAM (KLONOPIN) 0.5 MG tablet Take 1 tablet (0.5 mg total) by mouth 2 (two) times daily as needed. 60 tablet 0   cyanocobalamin (VITAMIN B12) 1000 MCG/ML injection Inject 1 mL (1,000 mcg total) into the muscle every 30 (thirty) days. 3 mL 1   cyclobenzaprine (FLEXERIL) 5 MG tablet Take 1 tablet (5 mg total) by mouth 2 (two) times daily. 60 tablet 0   desogestrel-ethinyl estradiol (MIRCETTE) 0.15-0.02/0.01 MG (21/5) tablet Take 1 tablet by mouth daily. 84 tablet 2   diazepam (VALIUM) 5 MG tablet Take 2 tablets (10 mg total) by mouth 30-60 minutes prior to procedure 2 tablet 0    hydrOXYzine (ATARAX) 25 MG tablet Take 1 tablet (25 mg total) by mouth 3 (three) times daily as needed for anxiety (insomnia). 90 tablet 0   lamoTRIgine (LAMICTAL) 100 MG tablet Take 2.5 tablets (250 mg total) by mouth daily. 75 tablet 0   LORazepam (ATIVAN) 2 MG tablet Take 1 tablet by mouth 1 hour prior to scan, take 1 tablet immediately prior if needed 2 tablet 0   pantoprazole (PROTONIX) 40 MG tablet Take 1 tablet (40 mg total) by mouth once daily 90  tablet 3   SYRINGE-NEEDLE, DISP, 3 ML (B-D 3CC LUER-LOK SYR 23GX1") 23G X 1" 3 ML MISC Use once monthly with vitamin B12 as directed 6 each 0   traMADol (ULTRAM) 50 MG tablet Take 1 tablet (50 mg total) by mouth every 6 (six) hours. 30 tablet 0   Vitamin D, Ergocalciferol, (DRISDOL) 1.25 MG (50000 UNIT) CAPS capsule Take 1 capsule (50,000 Units total) by mouth once a week for 12 weeks 12 capsule 0   No current facility-administered medications for this visit.     Musculoskeletal: Strength & Muscle Tone: within normal limits Gait & Station: normal Patient leans: N/A  Psychiatric Specialty Exam: Review of Systems  Musculoskeletal:  Positive for back pain.    Blood pressure 123/75, pulse 100, resp. rate 18, height 5\' 9"  (1.753 m), weight 254 lb 6.4 oz (115.4 kg), SpO2 99 %.Body mass index is 37.57 kg/m.  General Appearance: Casual  Eye Contact:  Fair  Speech:  Slow  Volume:  Decreased  Mood:  Anxious, Depressed, Dysphoric, and Hopeless  Affect:  Constricted and Depressed  Thought Process:  Descriptions of Associations: Intact  Orientation:  Full (Time, Place, and Person)  Thought Content: Paranoid Ideation, Rumination, and trust issue    Suicidal Thoughts:   passive suicidal thoughts but no plan  Homicidal Thoughts:  No  Memory:  Immediate;   Good Recent;   Good Remote;   Fair  Judgement:  Fair  Insight:  Fair  Psychomotor Activity:  Decreased  Concentration:  Concentration: Fair and Attention Span: Fair  Recall:  Good  Fund of  Knowledge: Good  Language: Good  Akathisia:  No  Handed:  Right  AIMS (if indicated): not done  Assets:  Communication Skills Desire for Improvement Housing Talents/Skills Transportation  ADL's:  Intact  Cognition: WNL  Sleep:  Fair   Screenings: GAD-7    Personnel officer Visit from 02/18/2022 in [place] at [facility] Visit from 04/26/2021 in [place] at [facility] Visit from 02/06/2020 in [place] at [facility] Visit from 10/27/2019 in [place] at [facility] Visit from 11/25/2017 in [place] at [facility]  Total GAD-7 Score 14 19 8 18 17       PHQ2-9    [place] Office Visit from 02/18/2022 in [place] at [facility] from 10/21/2021 in [place] Office Visit from 08/01/2021 in [facility] Office Visit from 04/26/2021 in [place] at [place] Visit from 10/27/2019 in [place] at [facility]  PHQ-2 Total Score 4 5 4 6 2   PHQ-9 Total Score 15 20 9 18 10       Flowsheet Row ED from 01/07/2022 in [facility] at [facility] ED from 01/06/2022 in [facility] Emergency Department at [facility] Counselor from 10/21/2021 in [place] No Risk No Risk Error: Question 6 not populated        Assessment and Plan: Major depressive disorder, recurrent.  Anxiety.  Patient apologized not reading the directions very well about Lamictal.  She is only taking 100 mg hand has noticed decrease in her energy, more depressed and ruminative thoughts.  We have offered IOP but patient refused due to her busy schedule.  We talk about seeing a therapist more frequently as patient has a lot  of ruminative and guilt that she is not a  good mother and not able to spend time with her 34 year old.  She worried about her son.  I encourage should go outside for a walk with her son.  Recommend that she should try to be more involved in her son's progress, daily reporting and collaborate with his therapist.  Patient agree and she will work on it.  She is also looking into more intense therapy so she can get in touch with the therapist frequently.  She admitted some time forgot to take hydroxyzine but promised that she will make sure to take the medication as prescribed.  We will increase Lamictal 300 mg daily.  Reminded if she has a rash or any itching then she should call us back immediately.  Continue Klonopin 0.5 mg twice daily as needed and hydroxyzine 25 mg 3 times a day.  Encourage exercise.  She is hoping to have MRI soon for her back.  Discuss safety concerns and any time having active suicidal thoughts or homicidal halogen need to call 911 or go to local emergency room.  Follow-up in 6 weeks.  Collaboration of Care: Collaboration of Care: Other provider involved in patient's care AEB notes are available in epic to review.  Patient/Guardian was advised Release of Information must be obtained prior to any record release in order to collaborate their care with an outside provider. Patient/Guardian was advised if they have not already done so to contact the registration department to sign all necessary forms in order for [place] to release information regarding their care.   Consent: Patient/Guardian gives verbal consent for treatment and assignment of benefits for services provided during this visit. Patient/Guardian expressed understanding and agreed to proceed.    Kathlee Nations, MD 04/10/2022, 4:16 PM

## 2022-04-11 ENCOUNTER — Encounter (HOSPITAL_COMMUNITY): Payer: Self-pay | Admitting: Psychiatry

## 2022-04-14 ENCOUNTER — Other Ambulatory Visit: Payer: Self-pay

## 2022-04-15 ENCOUNTER — Other Ambulatory Visit: Payer: Self-pay

## 2022-04-15 DIAGNOSIS — E538 Deficiency of other specified B group vitamins: Secondary | ICD-10-CM

## 2022-04-15 MED ORDER — CYANOCOBALAMIN 1000 MCG/ML IJ SOLN
1000.0000 ug | INTRAMUSCULAR | 0 refills | Status: DC
  Filled 2022-04-15: qty 3, 90d supply, fill #0

## 2022-04-28 ENCOUNTER — Other Ambulatory Visit: Payer: Self-pay

## 2022-04-28 DIAGNOSIS — G8929 Other chronic pain: Secondary | ICD-10-CM | POA: Diagnosis not present

## 2022-04-28 DIAGNOSIS — M545 Low back pain, unspecified: Secondary | ICD-10-CM | POA: Diagnosis not present

## 2022-04-28 DIAGNOSIS — M5412 Radiculopathy, cervical region: Secondary | ICD-10-CM | POA: Diagnosis not present

## 2022-04-28 DIAGNOSIS — M5417 Radiculopathy, lumbosacral region: Secondary | ICD-10-CM | POA: Diagnosis not present

## 2022-04-28 MED ORDER — GABAPENTIN 300 MG PO CAPS
300.0000 mg | ORAL_CAPSULE | Freq: Three times a day (TID) | ORAL | 1 refills | Status: DC
  Filled 2022-04-28: qty 90, 30d supply, fill #0

## 2022-04-29 ENCOUNTER — Telehealth (HOSPITAL_COMMUNITY): Payer: 59 | Admitting: Psychiatry

## 2022-05-15 ENCOUNTER — Other Ambulatory Visit: Payer: Self-pay

## 2022-05-19 DIAGNOSIS — F341 Dysthymic disorder: Secondary | ICD-10-CM | POA: Diagnosis not present

## 2022-05-20 ENCOUNTER — Ambulatory Visit: Payer: 59 | Admitting: Obstetrics and Gynecology

## 2022-05-26 ENCOUNTER — Other Ambulatory Visit: Payer: Self-pay

## 2022-05-26 ENCOUNTER — Telehealth (HOSPITAL_BASED_OUTPATIENT_CLINIC_OR_DEPARTMENT_OTHER): Payer: 59 | Admitting: Psychiatry

## 2022-05-26 ENCOUNTER — Encounter (HOSPITAL_COMMUNITY): Payer: Self-pay | Admitting: Psychiatry

## 2022-05-26 VITALS — Wt 254.0 lb

## 2022-05-26 DIAGNOSIS — F419 Anxiety disorder, unspecified: Secondary | ICD-10-CM | POA: Diagnosis not present

## 2022-05-26 DIAGNOSIS — F331 Major depressive disorder, recurrent, moderate: Secondary | ICD-10-CM

## 2022-05-26 MED ORDER — HYDROXYZINE HCL 25 MG PO TABS
25.0000 mg | ORAL_TABLET | Freq: Three times a day (TID) | ORAL | 0 refills | Status: DC | PRN
  Filled 2022-05-26: qty 90, 30d supply, fill #0

## 2022-05-26 MED ORDER — LAMOTRIGINE 150 MG PO TABS
300.0000 mg | ORAL_TABLET | Freq: Every day | ORAL | 1 refills | Status: DC
  Filled 2022-05-26 – 2022-06-19 (×2): qty 60, 30d supply, fill #0

## 2022-05-26 MED ORDER — CLONAZEPAM 0.5 MG PO TABS
0.5000 mg | ORAL_TABLET | Freq: Three times a day (TID) | ORAL | 1 refills | Status: DC | PRN
  Filled 2022-05-26 – 2022-06-19 (×2): qty 90, 30d supply, fill #0
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
## 2022-05-28 DIAGNOSIS — F341 Dysthymic disorder: Secondary | ICD-10-CM | POA: Diagnosis not present

## 2022-05-29 ENCOUNTER — Ambulatory Visit (HOSPITAL_COMMUNITY): Payer: 59 | Admitting: Psychiatry

## 2022-06-11 ENCOUNTER — Ambulatory Visit: Payer: 59 | Admitting: Podiatry

## 2022-06-13 ENCOUNTER — Other Ambulatory Visit: Payer: Self-pay

## 2022-06-17 ENCOUNTER — Ambulatory Visit (INDEPENDENT_AMBULATORY_CARE_PROVIDER_SITE_OTHER): Payer: 59 | Admitting: Obstetrics and Gynecology

## 2022-06-17 ENCOUNTER — Other Ambulatory Visit: Payer: Self-pay

## 2022-06-17 ENCOUNTER — Encounter: Payer: Self-pay | Admitting: Obstetrics and Gynecology

## 2022-06-17 VITALS — BP 114/76 | HR 112 | Ht 69.0 in | Wt 251.2 lb

## 2022-06-17 DIAGNOSIS — Z01419 Encounter for gynecological examination (general) (routine) without abnormal findings: Secondary | ICD-10-CM | POA: Diagnosis not present

## 2022-06-17 DIAGNOSIS — Z3169 Encounter for other general counseling and advice on procreation: Secondary | ICD-10-CM

## 2022-06-17 DIAGNOSIS — Z3009 Encounter for other general counseling and advice on contraception: Secondary | ICD-10-CM

## 2022-06-17 MED ORDER — PRENATAL VITAMIN 27-0.8 MG PO TABS
1.0000 | ORAL_TABLET | Freq: Every day | ORAL | 11 refills | Status: AC
  Filled 2022-06-17: qty 30, 30d supply, fill #0
  Filled 2022-08-15: qty 30, 30d supply, fill #1

## 2022-06-17 MED ORDER — DESOGESTREL-ETHINYL ESTRADIOL 0.15-0.02/0.01 MG (21/5) PO TABS
1.0000 | ORAL_TABLET | Freq: Every day | ORAL | 3 refills | Status: AC
  Filled 2022-06-17: qty 84, 84d supply, fill #0
  Filled 2022-10-06: qty 84, 84d supply, fill #1
  Filled 2023-01-15 (×2): qty 84, 84d supply, fill #2
  Filled 2023-05-12: qty 84, 84d supply, fill #3

## 2022-06-17 NOTE — Progress Notes (Signed)
Patients presents for annual exam today. She states doing well with current OCP but does want to discuss becoming pregnant. Up to date on pap smear. She states no other questions or concerns at this time.

## 2022-06-17 NOTE — Progress Notes (Signed)
HPI:      [name] is a 32 y.o. G1P1001 who LMP was Patient's last menstrual period was 06/11/2022.  Subjective:   She presents today for her annual examination.  She has no complaints.  She is taking OCPs regularly and having periods every 3 months.  She occasionally misses pills but not often.  She has experienced some weight gain.  She does receive steroid shots for her back where she has a bulging disc and has constant pain in the upper part of her buttocks.  She would like to lose some of this weight prior to becoming pregnant so she is actively beginning a dietary/exercise regimen. She would like to become pregnant within the next 6 months if possible.    Hx: The following portions of the patient's history were reviewed and updated as appropriate:             She  has a past medical history of Anemia, Anxiety, GERD (gastroesophageal reflux disease), Headache, Lumbar radiculopathy, PCOS (polycystic ovarian syndrome), and Plantar fasciitis, bilateral. She does not have any pertinent problems on file. 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 Her family history includes Cancer in her brother; Diabetes in her mother; Mental illness in her brother; Pancreatic cancer (age of onset: 107) in her father. She  reports that she quit smoking about 5 years ago. Her smoking use included cigarettes. She has a 1.00 pack-year smoking history. She has never used smokeless tobacco. She reports current alcohol use. She reports that she does not use drugs. She has a current medication list which includes the following prescription(s): clonazepam, cyanocobalamin, gabapentin, hydroxyzine, lamotrigine, pantoprazole, prenatal vitamin, b-d 3cc luer-lok syr 23gx1", vitamin d (ergocalciferol), and desogestrel-ethinyl estradiol. She has No Known Allergies.       Review of Systems:  Review of Systems  Constitutional:  Denied constitutional symptoms, night sweats, recent illness, fatigue, fever, insomnia and weight loss.  Eyes: Denied eye symptoms, eye pain, photophobia, vision change and visual disturbance.  Ears/Nose/Throat/Neck: Denied ear, nose, throat or neck symptoms, hearing loss, nasal discharge, sinus congestion and sore throat.  Cardiovascular: Denied cardiovascular symptoms, arrhythmia, chest pain/pressure, edema, exercise intolerance, orthopnea and palpitations.  Respiratory: Denied pulmonary symptoms, asthma, pleuritic pain, productive sputum, cough, dyspnea and wheezing.  Gastrointestinal: Denied, gastro-esophageal reflux, melena, nausea and vomiting.  Genitourinary: Denied genitourinary symptoms including symptomatic vaginal discharge, pelvic relaxation issues, and urinary complaints.  Musculoskeletal: Denied musculoskeletal symptoms, stiffness, swelling, muscle weakness and myalgia.  Dermatologic: Denied dermatology symptoms, rash and scar.  Neurologic: Denied neurology symptoms, dizziness, headache, neck pain and syncope.  Psychiatric: Denied psychiatric symptoms, anxiety and depression.  Endocrine: Denied endocrine symptoms including hot flashes and night sweats.   Meds:   Current Outpatient Medications on File Prior to Visit  Medication Sig Dispense Refill   clonazePAM (KLONOPIN) 0.5 MG tablet Take 1 tablet (0.5 mg total) by mouth 3 (three) times daily as needed. 90 tablet 1   cyanocobalamin (VITAMIN B12) 1000 MCG/ML injection Inject 1 mL (1,000 mcg total) into the muscle every 30 (thirty) days. 3 mL 0   gabapentin (NEURONTIN) 300 MG capsule Take 1 capsule (300 mg total) by mouth 3 (three) times daily. 90 capsule 1   hydrOXYzine (ATARAX) 25 MG tablet Take 1 tablet (25 mg total) by mouth 3 (three) times daily as needed for anxiety (insomnia). 90 tablet 0   lamoTRIgine (LAMICTAL) 150 MG tablet Take 2 tablets (  300 mg total) by mouth daily. 60 tablet 1   pantoprazole (PROTONIX) 40 MG tablet  Take 1 tablet (40 mg total) by mouth once daily 90 tablet 3   SYRINGE-NEEDLE, DISP, 3 ML (B-D 3CC LUER-LOK SYR 23GX1") 23G X 1" 3 ML MISC Use once monthly with vitamin B12 as directed 6 each 0   Vitamin D, Ergocalciferol, (DRISDOL) 1.25 MG (50000 UNIT) CAPS capsule Take 1 capsule (50,000 Units total) by mouth once a week for 12 weeks 12 capsule 0   No current facility-administered medications on file prior to visit.     Objective:     Vitals:   06/17/22 0907  BP: 114/76  Pulse: (!) 112    Filed Weights   06/17/22 0907  Weight: 251 lb 3.2 oz (113.9 kg)                Assessment:    G1P1001 Patient Active Problem List   Diagnosis Date Noted   Frequent headaches 09/06/2021   Acute pain of right shoulder 08/02/2021   Muscle tenderness 08/02/2021   High risk medication use 08/02/2021   MDD (major depressive disorder) 04/26/2021   Vitamin D deficiency 06/08/2020   Abnormal uterine bleeding 12/02/2019   Screening for STD (sexually transmitted disease) 10/19/2018   Encounter for birth control 03/23/2018   GERD (gastroesophageal reflux disease) 03/23/2018   Encounter for annual general medical examination with abnormal findings in adult 03/23/2018   Vitamin B12 deficiency due to intestinal malabsorption 12/16/2017   GAD (generalized anxiety disorder) 11/25/2017   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 Well woman exam with routine gynecological exam   2. Birth control counseling   3. Pre-conception counseling     Up-to-date on her Pap smear-declines examination today.   Plan:            1.  Basic Screening Recommendations The basic screening recommendations for asymptomatic women were discussed with the patient during her visit.  The age-appropriate recommendations were discussed with her and the rational for the tests reviewed.  When I am informed by the patient that another primary care physician has previously obtained the age-appropriate tests and they are up-to-date,  only outstanding tests are ordered and referrals given as necessary.  Abnormal results of tests will be discussed with her when all of her results are completed.  Routine preventative health maintenance measures emphasized: Exercise/Diet/Weight control, Tobacco Warnings, Alcohol/Substance use risks and Stress Management 2.  Continue OCPs 3.  Prepregnancy counseling given.  Patient plans to stop OCPs within 6 months. 4.  Weight loss discussed.  Calorie restriction and exercise discussed. Orders No orders of the defined types were placed in this encounter.    Meds ordered this encounter  Medications   desogestrel-ethinyl estradiol (MIRCETTE) 0.15-0.02/0.01 MG (21/5) tablet    Sig: Take 1 tablet by mouth daily.    Dispense:  84 tablet    Refill:  3   Prenatal Vit-Fe Fumarate-FA (PRENATAL VITAMIN) 27-0.8 MG TABS    Sig: Take 1 tablet by mouth daily.    Dispense:  30 tablet    Refill:  11          F/U  Return in about 1 year (around 06/17/2023) for Annual Physical.  Elonda Husky, M.D. 06/17/2022 9:44 AM

## 2022-06-18 ENCOUNTER — Ambulatory Visit: Payer: 59 | Admitting: Podiatry

## 2022-06-18 DIAGNOSIS — F341 Dysthymic disorder: Secondary | ICD-10-CM | POA: Diagnosis not present

## 2022-06-19 ENCOUNTER — Other Ambulatory Visit: Payer: Self-pay

## 2022-06-25 DIAGNOSIS — F341 Dysthymic disorder: Secondary | ICD-10-CM | POA: Diagnosis not present

## 2022-07-03 ENCOUNTER — Encounter: Payer: 59 | Admitting: Primary Care

## 2022-07-03 ENCOUNTER — Ambulatory Visit: Payer: 59 | Admitting: Podiatry

## 2022-07-04 ENCOUNTER — Encounter: Payer: Self-pay | Admitting: Primary Care

## 2022-07-07 ENCOUNTER — Other Ambulatory Visit: Payer: Self-pay

## 2022-07-07 MED ORDER — PREGABALIN 75 MG PO CAPS
75.0000 mg | ORAL_CAPSULE | Freq: Two times a day (BID) | ORAL | 11 refills | Status: DC
  Filled 2022-07-07: qty 60, 30d supply, fill #0
  Filled 2022-09-16: qty 60, 30d supply, fill #1

## 2022-07-09 ENCOUNTER — Telehealth: Payer: 59 | Admitting: Physician Assistant

## 2022-07-09 ENCOUNTER — Ambulatory Visit: Payer: 59

## 2022-07-09 ENCOUNTER — Telehealth (HOSPITAL_COMMUNITY): Payer: Self-pay

## 2022-07-09 ENCOUNTER — Encounter: Payer: 59 | Admitting: Primary Care

## 2022-07-09 ENCOUNTER — Other Ambulatory Visit: Payer: Self-pay

## 2022-07-09 DIAGNOSIS — B353 Tinea pedis: Secondary | ICD-10-CM | POA: Diagnosis not present

## 2022-07-09 DIAGNOSIS — F341 Dysthymic disorder: Secondary | ICD-10-CM | POA: Diagnosis not present

## 2022-07-09 MED ORDER — KETOCONAZOLE 2 % EX CREA
1.0000 | TOPICAL_CREAM | Freq: Two times a day (BID) | CUTANEOUS | 0 refills | Status: AC
Start: 2022-07-09 — End: ?
  Filled 2022-07-09: qty 60, 30d supply, fill #0

## 2022-07-09 NOTE — Patient Instructions (Signed)
Crystal Diaz, thank you for joining Margaretann Loveless, PA-C for today's virtual visit.  While this provider is not your primary care provider (PCP), if your PCP is located in our provider database this encounter information will be shared with them immediately following your visit.   A [facility] MyChart account gives you access to today's visit and all your visits, tests, and labs performed at [facility] " click here if you don't have a [facility] MyChart account or go to [email or web address]  Consent: (Patient) Crystal Diaz provided verbal consent for this virtual visit at the beginning of the encounter.  Current Medications:  Current Outpatient Medications:    ketoconazole (NIZORAL) 2 % cream, Apply 1 Application topically 2 (two) times daily., Disp: 60 g, Rfl: 0   clonazePAM (KLONOPIN) 0.5 MG tablet, Take 1 tablet (0.5 mg total) by mouth 3 (three) times daily as needed., Disp: 90 tablet, Rfl: 1   cyanocobalamin (VITAMIN B12) 1000 MCG/ML injection, Inject 1 mL (1,000 mcg total) into the muscle every 30 (thirty) days., Disp: 3 mL, Rfl: 0   desogestrel-ethinyl estradiol (MIRCETTE) 0.15-0.02/0.01 MG (21/5) tablet, Take 1 tablet by mouth daily., Disp: 84 tablet, Rfl: 3   gabapentin (NEURONTIN) 300 MG capsule, Take 1 capsule (300 mg total) by mouth 3 (three) times daily., Disp: 90 capsule, Rfl: 1   hydrOXYzine (ATARAX) 25 MG tablet, Take 1 tablet (25 mg total) by mouth 3 (three) times daily as needed for anxiety (insomnia)., Disp: 90 tablet, Rfl: 0   lamoTRIgine (LAMICTAL) 150 MG tablet, Take 2 tablets (300 mg total) by mouth daily., Disp: 60 tablet, Rfl: 1   pantoprazole (PROTONIX) 40 MG tablet, Take 1 tablet (40 mg total) by mouth once daily, Disp: 90 tablet, Rfl: 3   pregabalin (LYRICA) 75 MG capsule, Take 1 capsule (75 mg total) by mouth 2 (two) times daily., Disp: 60 capsule, Rfl: 11   Prenatal Vit-Fe Fumarate-FA (PRENATAL VITAMIN) 27-0.8 MG TABS, Take 1 tablet by  mouth daily., Disp: 30 tablet, Rfl: 11   SYRINGE-NEEDLE, DISP, 3 ML (B-D 3CC LUER-LOK SYR 23GX1") 23G X 1" 3 ML MISC, Use once monthly with vitamin B12 as directed, Disp: 6 each, Rfl: 0   Vitamin D, Ergocalciferol, (DRISDOL) 1.25 MG (50000 UNIT) CAPS capsule, Take 1 capsule (50,000 Units total) by mouth once a week for 12 weeks, Disp: 12 capsule, Rfl: 0   Medications ordered in this encounter:  Meds ordered this encounter  Medications   ketoconazole (NIZORAL) 2 % cream    Sig: Apply 1 Application topically 2 (two) times daily.    Dispense:  60 g    Refill:  0    Order Specific Question:   Supervising Provider    Answer:   Merrilee Jansky [id number]     *If you need refills on other medications prior to your next appointment, please contact your pharmacy*  Follow-Up: Call back or seek an in-person evaluation if the symptoms worsen or if the condition fails to improve as anticipated.  [facility] [phone]  Other Instructions Athlete's Foot Athlete's foot (tinea pedis) is a fungal infection of the skin on your feet. It often occurs on the skin that is between or underneath the toes. It can also occur on the soles of your feet. The infection can spread from person to person (is contagious). It can also spread when a person's bare feet come in contact with the fungus on shower floors or on items such as shoes.  What are the causes? This condition is caused by a fungus that grows in warm, moist places. You can get athlete's foot by sharing shoes, shower stalls, towels, and wet floors with someone who is infected. Not washing your feet or changing your socks often enough can also lead to athlete's foot. What increases the risk? This condition is more likely to develop in: Men. People who have a weak body defense system (immune system). People who have diabetes. People who use public showers, such as at a gym. People who wear heavy-duty shoes, such as Emergency planning/management officer. Seasons with warm, humid weather. What are the signs or symptoms? Symptoms of this condition include: Itchy areas between your toes or on the soles of your feet. White, flaky, or scaly areas between your toes or on the soles of your feet. Very itchy small blisters between your toes or on the soles of your feet. Small cuts in your skin. These cuts can become infected. Thick or discolored toenails. How is this diagnosed? This condition may be diagnosed with a physical exam and a review of your medical history. Your health care provider may also take a skin or toenail sample to examine under a microscope. How is this treated? This condition is treated with antifungal medicines. These may be applied as powders, ointments, or creams. In severe cases, an oral antifungal medicine may be given. Follow these instructions at home: Medicines Apply or take over-the-counter and prescription medicines only as told by your health care provider. Apply your antifungal medicine as told by your health care provider. Do not stop using the antifungal even if your condition improves. Foot care Do not scratch your feet. Keep your feet dry: Wear cotton or wool socks. Change your socks every day or if they become wet. Wear shoes that allow air to flow, such as sandals or canvas tennis shoes. Wash and dry your feet, including the area between your toes. Also, wash and dry your feet: Every day or as told by your health care provider. After exercising. General instructions Do not let others use towels, shoes, nail clippers, or other personal items that touch your feet. Protect your feet by wearing sandals in wet areas, such as locker rooms and shared showers. Keep all follow-up visits. This is important. If you have diabetes, keep your blood sugar under control. Contact a health care provider if: You have a fever. You have swelling, soreness, warmth, or redness in your foot. Your feet are not  getting better with treatment. Your symptoms get worse. You have new symptoms. You have severe pain. Summary Athlete's foot (tinea pedis) is a fungal infection of the skin on your feet. It often occurs on skin that is between or underneath the toes. This condition is caused by a fungus that grows in warm, moist places. Symptoms include white, flaky, or scaly areas between your toes or on the soles of your feet. This condition is treated with antifungal medicines. Keep your feet clean. Always dry them thoroughly. This information is not intended to replace advice given to you by your health care provider. Make sure you discuss any questions you have with your health care provider. Document Revised: 04/22/2020 Document Reviewed: 04/22/2020 Elsevier Patient Education  2024 Elsevier Inc.    If you have been instructed to have an in-person evaluation today at a local Urgent Care facility, please use the link below. It will take you to a list of all of our available  Urgent Cares, including  address, phone number and hours of operation. Please do not delay care.  [facility]  If you or a family member do not have a primary care provider, use the link below to schedule a visit and establish care. When you choose a [facility] primary care physician or advanced practice provider, you gain a long-term partner in health. Find a Primary Care Provider  Learn more about [facility] in-office and virtual care options: [facility]

## 2022-07-09 NOTE — Telephone Encounter (Signed)
Patient called due to a rash on her foot and her hands peeling. Patient was on Lamictal and stopped it 3 days ago, she also stopped her Hydroxyzine stating that she has gained 6 lbs. Patient states she has gone through 2 tubes of fungal cream and it does not help. I told the patient that since stopping the medication the rash should start to clear up, she was insistent that it is not and it is getting worse. Patient would like to speak with a provider about her concerns. Please review and advise, thank you

## 2022-07-09 NOTE — Telephone Encounter (Signed)
I can't do anything over the phone , she needs to be seen by PCP or urgent care

## 2022-07-09 NOTE — Progress Notes (Signed)
Virtual Visit Consent   Crystal Diaz, you are scheduled for a virtual visit with a [facility] provider today. Just as with appointments in the office, your consent must be obtained to participate. Your consent will be active for this visit and any virtual visit you may have with one of our providers in the next 365 days. If you have a MyChart account, a copy of this consent can be sent to you electronically.  As this is a virtual visit, video technology does not allow for your provider to perform a traditional examination. This may limit your provider's ability to fully assess your condition. If your provider identifies any concerns that need to be evaluated in person or the need to arrange testing (such as labs, EKG, etc.), we will make arrangements to do so. Although advances in technology are sophisticated, we cannot ensure that it will always work on either your end or our end. If the connection with a video visit is poor, the visit may have to be switched to a telephone visit. With either a video or telephone visit, we are not always able to ensure that we have a secure connection.  By engaging in this virtual visit, you consent to the provision of healthcare and authorize for your insurance to be billed (if applicable) for the services provided during this visit. Depending on your insurance coverage, you may receive a charge related to this service.  I need to obtain your verbal consent now. Are you willing to proceed with your visit today? Crystal Diaz has provided verbal consent on 07/09/2022 for a virtual visit (video or telephone). Margaretann Loveless, PA-C  Date: 07/09/2022 2:29 PM  Virtual Visit via Video Note   I, Margaretann Loveless, connected with  Crystal Diaz  ([id number], 10/30/1990) on 07/09/22 at  2:15 PM EDT by a video-enabled telemedicine application and verified that I am speaking with the correct person using two identifiers.  Location: Patient: Virtual Visit Location  Patient: Other: work; isolated Provider: Engineer, mining Provider: Home Office   I discussed the limitations of evaluation and management by telemedicine and the availability of in person appointments. The patient expressed understanding and agreed to proceed.    History of Present Illness: Crystal Diaz is a 32 y.o. who identifies as a female who was assigned female at birth, and is being seen today for rash and peeling of her feet. Has been ongoing for 2-3 weeks now. Has history of having sweaty feet that require her to put powder in her shoes. Has tried OTC antifungals without relief.    Problems:  Patient Active Problem List   Diagnosis Date Noted   Frequent headaches 09/06/2021   Acute pain of right shoulder 08/02/2021   Muscle tenderness 08/02/2021   High risk medication use 08/02/2021   MDD (major depressive disorder) 04/26/2021   Vitamin D deficiency 06/08/2020   Abnormal uterine bleeding 12/02/2019   Screening for STD (sexually transmitted disease) 10/19/2018   Encounter for birth control 03/23/2018   GERD (gastroesophageal reflux disease) 03/23/2018   Encounter for annual general medical examination with abnormal findings in adult 03/23/2018   Vitamin B12 deficiency due to intestinal malabsorption 12/16/2017   GAD (generalized anxiety disorder) 11/25/2017   S/P gastric bypass 03/17/2017    Allergies: No Known Allergies Medications:  Current Outpatient Medications:    ketoconazole (NIZORAL) 2 % cream, Apply 1 Application topically 2 (two) times daily., Disp: 60 g, Rfl: 0   clonazePAM (KLONOPIN) 0.5  MG tablet, Take 1 tablet (0.5 mg total) by mouth 3 (three) times daily as needed., Disp: 90 tablet, Rfl: 1   cyanocobalamin (VITAMIN B12) 1000 MCG/ML injection, Inject 1 mL (1,000 mcg total) into the muscle every 30 (thirty) days., Disp: 3 mL, Rfl: 0   desogestrel-ethinyl estradiol (MIRCETTE) 0.15-0.02/0.01 MG (21/5) tablet, Take 1 tablet by mouth daily., Disp: 84 tablet,  Rfl: 3   gabapentin (NEURONTIN) 300 MG capsule, Take 1 capsule (300 mg total) by mouth 3 (three) times daily., Disp: 90 capsule, Rfl: 1   hydrOXYzine (ATARAX) 25 MG tablet, Take 1 tablet (25 mg total) by mouth 3 (three) times daily as needed for anxiety (insomnia)., Disp: 90 tablet, Rfl: 0   lamoTRIgine (LAMICTAL) 150 MG tablet, Take 2 tablets (300 mg total) by mouth daily., Disp: 60 tablet, Rfl: 1   pantoprazole (PROTONIX) 40 MG tablet, Take 1 tablet (40 mg total) by mouth once daily, Disp: 90 tablet, Rfl: 3   pregabalin (LYRICA) 75 MG capsule, Take 1 capsule (75 mg total) by mouth 2 (two) times daily., Disp: 60 capsule, Rfl: 11   Prenatal Vit-Fe Fumarate-FA (PRENATAL VITAMIN) 27-0.8 MG TABS, Take 1 tablet by mouth daily., Disp: 30 tablet, Rfl: 11   SYRINGE-NEEDLE, DISP, 3 ML (B-D 3CC LUER-LOK SYR 23GX1") 23G X 1" 3 ML MISC, Use once monthly with vitamin B12 as directed, Disp: 6 each, Rfl: 0   Vitamin D, Ergocalciferol, (DRISDOL) 1.25 MG (50000 UNIT) CAPS capsule, Take 1 capsule (50,000 Units total) by mouth once a week for 12 weeks, Disp: 12 capsule, Rfl: 0  Observations/Objective: Patient is well-developed, well-nourished in no acute distress.  Resting comfortably at home.  Head is normocephalic, atraumatic.  No labored breathing.  Speech is clear and coherent with logical content.  Patient is alert and oriented at baseline.    Assessment and Plan: 1. Tinea pedis of both feet - ketoconazole (NIZORAL) 2 % cream; Apply 1 Application topically 2 (two) times daily.  Dispense: 60 g; Refill: 0  - Peeling skin of feet with some sores from peeling skin too deeply - Discussed athlete's foot, dyshidrotic eczema, psoriasis - Will try Ketoconazole cream - Keep feet clean and dry - Keep scheduled follow up with PCP  Follow Up Instructions: I discussed the assessment and treatment plan with the patient. The patient was provided an opportunity to ask questions and all were answered. The patient  agreed with the plan and demonstrated an understanding of the instructions.  A copy of instructions were sent to the patient via MyChart unless otherwise noted below.    The patient was advised to call back or seek an in-person evaluation if the symptoms worsen or if the condition fails to improve as anticipated.  Time:  I spent 10 minutes with the patient via telehealth technology discussing the above problems/concerns.    Margaretann Loveless, PA-C

## 2022-07-14 ENCOUNTER — Ambulatory Visit: Payer: 59

## 2022-07-16 ENCOUNTER — Encounter: Payer: Self-pay | Admitting: Primary Care

## 2022-07-16 ENCOUNTER — Ambulatory Visit (INDEPENDENT_AMBULATORY_CARE_PROVIDER_SITE_OTHER): Payer: 59 | Admitting: Primary Care

## 2022-07-16 VITALS — BP 122/78 | HR 79 | Temp 98.1°F | Ht 69.0 in | Wt 257.0 lb

## 2022-07-16 DIAGNOSIS — Z13220 Encounter for screening for lipoid disorders: Secondary | ICD-10-CM | POA: Diagnosis not present

## 2022-07-16 DIAGNOSIS — E559 Vitamin D deficiency, unspecified: Secondary | ICD-10-CM | POA: Diagnosis not present

## 2022-07-16 DIAGNOSIS — E6609 Other obesity due to excess calories: Secondary | ICD-10-CM | POA: Diagnosis not present

## 2022-07-16 DIAGNOSIS — F331 Major depressive disorder, recurrent, moderate: Secondary | ICD-10-CM

## 2022-07-16 DIAGNOSIS — K219 Gastro-esophageal reflux disease without esophagitis: Secondary | ICD-10-CM

## 2022-07-16 DIAGNOSIS — N939 Abnormal uterine and vaginal bleeding, unspecified: Secondary | ICD-10-CM

## 2022-07-16 DIAGNOSIS — E538 Deficiency of other specified B group vitamins: Secondary | ICD-10-CM

## 2022-07-16 DIAGNOSIS — Z6837 Body mass index (BMI) 37.0-37.9, adult: Secondary | ICD-10-CM

## 2022-07-16 DIAGNOSIS — F411 Generalized anxiety disorder: Secondary | ICD-10-CM

## 2022-07-16 LAB — COMPREHENSIVE METABOLIC PANEL
ALT: 13 U/L (ref 0–35)
AST: 16 U/L (ref 0–37)
Albumin: 3.9 g/dL (ref 3.5–5.2)
Alkaline Phosphatase: 33 U/L — ABNORMAL LOW (ref 39–117)
BUN: 12 mg/dL (ref 6–23)
CO2: 26 mEq/L (ref 19–32)
Calcium: 9.5 mg/dL (ref 8.4–10.5)
Chloride: 106 mEq/L (ref 96–112)
Creatinine, Ser: 0.76 mg/dL (ref 0.40–1.20)
GFR: 102.23 mL/min (ref >60.00)
Glucose, Bld: 92 mg/dL (ref 70–99)
Potassium: 4.5 mEq/L (ref 3.5–5.1)
Sodium: 138 mEq/L (ref 135–145)
Total Bilirubin: 0.3 mg/dL (ref 0.2–1.2)
Total Protein: 6.8 g/dL (ref 6.0–8.3)

## 2022-07-16 LAB — VITAMIN D 25 HYDROXY (VIT D DEFICIENCY, FRACTURES): VITD: 27.61 ng/mL — ABNORMAL LOW (ref 30.00–100.00)

## 2022-07-16 LAB — LIPID PANEL
Cholesterol: 126 mg/dL (ref 0–200)
HDL: 51 mg/dL (ref >39.00)
LDL Cholesterol: 66 mg/dL (ref 0–99)
NonHDL: 75.43
Total CHOL/HDL Ratio: 2
Triglycerides: 47 mg/dL (ref 0.0–149.0)
VLDL: 9.4 mg/dL (ref 0.0–40.0)

## 2022-07-16 LAB — VITAMIN B12: Vitamin B-12: 565 pg/mL (ref 211–911)

## 2022-07-16 NOTE — Assessment & Plan Note (Signed)
Improved. Following with GYN, office notes reviewed from June 2024.  Continue OCPs.

## 2022-07-16 NOTE — Assessment & Plan Note (Signed)
Overall improving.  Continue psychiatry and therapy appointments as scheduled. She will discuss other options for treatment with her psychiatrist as she is no longer taking Lamictal or hydroxyzine.  Continue clonazepam 0.5 mg as needed.

## 2022-07-16 NOTE — Assessment & Plan Note (Signed)
Controlled. ° °Continue pantoprazole 40 mg daily. °

## 2022-07-16 NOTE — Assessment & Plan Note (Signed)
Overall improving.  Continue psychiatry and therapy appointments as scheduled. She will discuss other options for treatment with her psychiatrist as she is no longer taking Lamictal or hydroxyzine.  Continue clonazepam 0.5 mg as needed. 

## 2022-07-16 NOTE — Patient Instructions (Signed)
Take the vitamin D capsules once weekly as prescribed.  Continue vitamin B-12 injections monthly.  Stop by the lab prior to leaving today. I will notify you of your results once received.   Follow-up with your psychiatrist as scheduled.  It was a pleasure to see you today!

## 2022-07-16 NOTE — Assessment & Plan Note (Signed)
Continue vitamin B12 1000 mcg monthly. Repeat vitamin B12 level pending.
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
## 2022-07-16 NOTE — Assessment & Plan Note (Signed)
Discussed importance of weekly compliance to vitamin D 50,000 IU capsules.  Repeat vitamin D level pending.

## 2022-07-23 DIAGNOSIS — F341 Dysthymic disorder: Secondary | ICD-10-CM | POA: Diagnosis not present

## 2022-07-24 NOTE — Therapy (Deleted)
OUTPATIENT PHYSICAL THERAPY EVALUATION   Patient Name: Crystal Diaz MRN: [id number] DOB:January 07, 1991, 32 y.o., female Today's Date: 07/24/2022  END OF SESSION:   Past Medical History:  Diagnosis Date   Anemia    Anxiety    Encounter for birth control 03/23/2018   GERD (gastroesophageal reflux disease)    Headache    Lumbar radiculopathy    PCOS (polycystic ovarian syndrome)    Plantar fasciitis, bilateral    Past Surgical History:  Procedure Laterality Date   FOOT SURGERY Right    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   Procedure: LAPAROSCOPIC ROUX-EN-Y GASTRIC BYPASS WITH HIATAL HERNIA REPAIR AND UPPER ENDOSCOPY;  Surgeon: Luretha Murphy, MD;  Location: [facility];  Service: General;  Laterality: N/A;   WISDOM TOOTH EXTRACTION     Patient Active Problem List   Diagnosis Date Noted   Frequent headaches 09/06/2021   High risk medication use 08/02/2021   MDD (major depressive disorder) 04/26/2021   Vitamin D deficiency 06/08/2020   Abnormal uterine bleeding 12/02/2019   Screening for STD (sexually transmitted disease) 10/19/2018   GERD (gastroesophageal reflux disease) 03/23/2018   Encounter for annual general medical examination with abnormal findings in adult 03/23/2018   Vitamin B12 deficiency due to intestinal malabsorption 12/16/2017   GAD (generalized anxiety disorder) 11/25/2017   S/P gastric bypass 03/17/2017    PCP: Doreene Nest, NP  REFERRING PROVIDER: Doren Custard, NP ([facility])  REFERRING DIAG: chronic bilateral low back pain with bilateral sciatica  Rationale for Evaluation and Treatment: Rehabilitation  THERAPY DIAG:  No diagnosis found.  ONSET DATE: ***  SUBJECTIVE:                                                                                                                                                                                           SUBJECTIVE STATEMENT: ***  PERTINENT HISTORY:  Patient is a 32  y.o. female who presents to outpatient physical therapy with a referral for medical diagnosis chronic bilateral low back pain with bilateral sciatica. This patient's chief complaints consist of ***, leading to the following functional deficits: ***. Relevant past medical history and comorbidities include S/P gastric bypass; GAD (generalized anxiety disorder); Vitamin B12 deficiency due to intestinal malabsorption; GERD (gastroesophageal reflux disease); Abnormal uterine bleeding; Vitamin D deficiency; MDD (major depressive disorder); High risk medication use; and Frequent headaches; past medical history of Anemia, Anxiety, Encounter for birth control (03/23/2018), GERD (gastroesophageal reflux disease), Headache, Lumbar radiculopathy, PCOS (polycystic ovarian syndrome), and Plantar fasciitis, bilateral; past surgical history that includes Foot surgery (Right); Wisdom tooth extraction; and Gastric Roux-En-Y (N/A, 03/17/2017). Patient denies hx  of {redflags:27294}  PAIN:  Are you having pain? Yes NPRS: Current: ***/10,  Best: ***/10, Worst: ***/10. Pain location: *** Pain description: *** Aggravating factors: *** Relieving factors: ***   FUNCTIONAL LIMITATIONS: ***  LEISURE: ***   PRECAUTIONS: {Therapy precautions:24002}  WEIGHT BEARING RESTRICTIONS: {Yes ***/No:24003}  FALLS:  Has patient fallen in last 6 months? {fallsyesno:27318}  LIVING ENVIRONMENT: Lives with: {[facility] lives with:25569::"lives with their family"} Lives in: {Lives in:25570} Stairs: {opstairs:27293} Has following equipment at home: {Assistive devices:23999}  OCCUPATION: ***  PLOF: {PLOF:24004}  PATIENT GOALS: ***  NEXT MD VISIT: ***  OBJECTIVE  DIAGNOSTIC FINDINGS:  Lumbar MRI report from 02/26/2022:  CLINICAL DATA:  Central and bilateral lower back and buttock pain, bilateral foot numbness   EXAM: MRI LUMBAR SPINE WITHOUT CONTRAST   TECHNIQUE: Multiplanar, multisequence MR imaging of the lumbar spine  was performed. No intravenous contrast was administered.   COMPARISON:  No prior MRI of the lumbar spine, correlation is made with CT lumbar spine 03/10/2021   FINDINGS: Segmentation: 5 lumbar type vertebral bodies. The lowest formed disc space is L5-S1.   Alignment: Trace retrolisthesis of L5 on S1, unchanged. Straightening of the normal lumbar lordosis. Mild dextroscoliosis.   Vertebrae:  No fracture, evidence of discitis, or bone lesion.   Conus medullaris and cauda equina: Conus extends to the L1-L2 level. Conus and cauda equina appear normal.   Paraspinal and other soft tissues: Negative.   Disc levels:   T12-L1: No significant disc bulge. No spinal canal stenosis or neural foraminal narrowing.   L1-L2: No significant disc bulge. Mild facet arthropathy. No spinal canal stenosis or neural foraminal narrowing.   L2-L3: No significant disc bulge. Mild facet arthropathy. No spinal canal stenosis or neural foraminal narrowing.   L3-L4: No significant disc bulge. Mild facet arthropathy. No spinal canal stenosis or neural foraminal narrowing.   L4-L5: No significant disc bulge. Mild facet arthropathy. Narrowing of the left lateral recess. No spinal canal stenosis or neural foraminal narrowing.   L5-S1: Central disc extrusion with 6 mm caudal migration. This contacts the descending S1 nerve roots. Mild facet arthropathy. No spinal canal stenosis or neural foraminal narrowing.   IMPRESSION: 1. 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 2. Narrowing of the left lateral recess at L4-L5, which could affect the descending left L5 nerve roots. 3. Multilevel mild facet arthropathy.     Electronically Signed   By: Wiliam Ke M.D.   On: 02/26/2022 19:38  SELF- REPORTED FUNCTION FOTO score: ***/100 (lumbar spine questionnaire)  OBSERVATION/INSPECTION Posture Posture (seated): forward head, rounded shoulders,  slumped in sitting.  Posture (standing): *** Posture correction: *** Anthropometrics Tremor: none Body composition: *** Muscle bulk: *** Skin: The incision sites appear to be healing well with no excessive redness, warmth, drainage or signs of infection present.  *** Edema: *** Functional Mobility Bed mobility: *** Transfers: *** Gait: grossly WFL for household and short community ambulation. More detailed gait analysis deferred to later date as needed. *** Stairs: ***  SPINE MOTION  LUMBAR SPINE AROM *Indicates pain Flexion: *** Extension: *** Side Flexion:   R ***  L *** Rotation:  R *** L *** Side glide:  R *** L ***   NEUROLOGICAL  Upper Motor Neuron Screen Babinski, Hoffman's and Clonus (ankle) negative bilaterally.  Dermatomes C2-T1 appears equal and intact to light touch except the following: *** L2-S2 appears equal and intact to light touch except the following: *** Deep Tendon Reflexes  R/L  ***+/***+ Biceps brachii reflex (C5, C6) ***+/***+ Brachioradialis reflex (C6) ***+/***+ Triceps brachii reflex (C7) ***+/***+ Quadriceps reflex (L4) ***+/***+ Achilles reflex (S1)  SPINE MOTION  CERVICAL SPINE AROM *Indicates pain Flexion: *** Extension: *** Side Flexion:   R ***  L *** Rotation:  R *** L ***   PERIPHERAL JOINT MOTION (in degrees)  ACTIVE RANGE OF MOTION (AROM) *Indicates pain Date Date Date  Joint/Motion R/L R/L R/L  Shoulder     Flexion / / /  Extension / / /  Abduction  / / /  External rotation / / /  Internal rotation / / /  Elbow     Flexion  / / /  Extension  / / /  Wrist     Flexion / / /  Extension  / / /  Radial deviation / / /  Ulnar deviation / / /  Pronation / / /  Supination / / /  Hip     Flexion / / /  Extension  / / /  Abduction / / /  Adduction / / /  External rotation / / /  Internal rotation  / / /  Knee     Extension / / /  Flexoin / / /  Ankle/Foot     Dorsiflexion (knee ext) / / /   Dorsiflexion (knee flex) / / /  Plantarflexion / / /  Everison / / /  Inversion / / /  Great toe extension / / /  Great toe flexion / / /  Comments:   PASSIVE RANGE OF MOTION (PROM) *Indicates pain Date Date Date  Joint/Motion R/L R/L R/L  Shoulder     Flexion / / /  Extension / / /  Abduction  / / /  External rotation / / /  Internal rotation / / /  Elbow     Flexion  / / /  Extension  / / /  Wrist     Flexion / / /  Extension  / / /  Radial deviation / / /  Ulnar deviation / / /  Pronation / / /  Supination / / /  Hip     Flexion  / / /  Extension  / / /  Abduction / / /  Adduction / / /  External rotation / / /  Internal rotation  / / /  Knee     Extension / / /  Flexion / / /  Ankle/Foot     Dorsiflexion (knee ext) / / /  Dorsiflexion (knee flex) / / /  Plantarflexion / / /  Everison / / /  Inversion / / /  Great toe extension / / /  Great toe flexion / / /  Comments:   MUSCLE PERFORMANCE (MMT):  *Indicates pain Date Date Date  Joint/Motion R/L R/L R/L  Shoulder     Flexion / / /  Abduction (C5) / / /  External rotation / / /  Internal rotation / / /  Extension / / /  Elbow     Flexion (C6) / / /  Extension (C7) / / /  Wrist     Flexion (C7) / / /  Extension (C6) / / /  Radial deviation / / /  Ulnar deviation (C8) / / /  Pronation / / /  Supination / / /  Hand     Thumb extension (C8) / / /  Finger abduction (  T1) / / /  Grip (C8) / / /  Hip     Flexion (L1, L2) / / /  Extension (knee ext) / / /  Extension (knee flex) / / /  Abduction / / /  Adduction / / /  External rotation / / /  Internal rotation  / / /  Knee     Extension (L3) / / /  Flexion (S2) / / /  Ankle/Foot     Dorsiflexion (L4) / / /  Great toe extension (L5) / / /  Eversion (S1) / / /  Plantarflexion (S1) / / /  Inversion / / /  Pronation / / /  Great toe flexion / / /  Comments:   SPECIAL  TESTS:  .Neurodynamictests .NeurodynamicUE .NeurodynamicLE .CspineInstability .CSPINESPECIALTESTS .SHOULDERSPECIALTESTCLUSTERS .HIPSPECIALTESTS .SIJSPECIALTESTS   SHOULDER SPECIAL TESTS RTC, Impingement, Anterior Instability (macrotrauma), Labral Tear: Painful arc test: R = ***, L = ***. Drop arm test: R = ***, L = ***. Hawkins-Kennedy test: R = ***, L = ***. Infraspinatus test: R = ***, L = ***. Apprehension test: R = ***, L = ***. Relocation test: R = ***, L = ***. Active compression test: R = ***, L = ***.  ACCESSORY MOTION: ***  PALPATION: ***  SUSTAINED POSITIONS TESTING:  ***  REPEATED MOTIONS TESTING: ***  FUNCTIONAL/BALANCE TESTS: Five Time Sit to Stand (5TSTS): *** seconds Functional Gait Assessment (FGA): ***/30 (see details above) Ten meter walking trial ( ): *** m/s Six Minute Walk Test ( ): *** feet Timed Up and Go (TUG): *** seconds   Dynamic Gait Index: ***/24 BERG Balance Scale: ***/56 Tinetti/POMA: ***/28 Timed Up and GO: *** seconds (average of 3 trials) Trial 1: *** Trial 2: *** Trial 3: *** Romberg test: -Narrow stance, eyes open: *** seconds -Narrow stance, eyes closed: *** seconds Sharpened Romberg test: -Tandem stance, eyes open: *** seconds -Tandem stance, eyes closed: *** seconds  Narrow stance, firm surface, eyes open: *** seconds Narrow stance, firm surface, eyes closed: *** seconds Narrow stance, compliant surface, eyes open: *** seconds Narrow stance, compliant surface, eyes closed: *** seconds Single leg stance, firm surface, eyes open: R= *** seconds, L= *** seconds Single leg stance, compliant surface, eyes open: R= *** seconds, L= *** seconds Gait speed: *** m/s Functional reach test: *** inches      TODAY'S TREATMENT:      PATIENT EDUCATION:  Education details: *** Person educated: {Person educated:25204} Education method: {Education Method:25205} Education comprehension: {Education  Comprehension:25206}  HOME EXERCISE PROGRAM: ***  ASSESSMENT:  CLINICAL IMPRESSION: Patient is a 32 y.o. female referred to outpatient physical therapy with a medical diagnosis of chronic bilateral low back pain with bilateral sciatica who presents with signs and symptoms consistent with ***. Patient presents with significant *** impairments that are limiting ability to complete *** without difficulty. Patient will benefit from skilled physical therapy intervention to address current body structure impairments and activity limitations to improve function and work towards goals set in current POC in order to return to prior level of function or maximal functional improvement.    OBJECTIVE IMPAIRMENTS: {opptimpairments:25111}.   ACTIVITY LIMITATIONS: {activitylimitations:27494}  PARTICIPATION LIMITATIONS: {participationrestrictions:25113}  PERSONAL FACTORS: {Personal factors:25162} are also affecting patient's functional outcome.   REHAB POTENTIAL: {rehabpotential:25112}  CLINICAL DECISION MAKING: {clinical decision making:25114}  EVALUATION COMPLEXITY: {Evaluation complexity:25115}   GOALS: Goals reviewed with patient? {yes/no:20286}  SHORT TERM GOALS: Target date: 08/07/2022  Patient will be independent with initial home exercise program for self-management of symptoms. Baseline: {  HEPbaseline4:27310} (07/24/22); Goal status: INITIAL   LONG TERM GOALS: Target date: 10/16/2022  Patient will be independent with a long-term home exercise program for self-management of symptoms.  Baseline: {HEPbaseline4:27310} (07/24/22); Goal status: INITIAL  2.  Patient will demonstrate improved FOTO to equal or greater than *** by visit #*** to demonstrate improvement in overall condition and self-reported functional ability.  Baseline: *** (07/24/22); Goal status: INITIAL  3.  *** Baseline: *** (07/24/22); Goal status: INITIAL  4.  *** Baseline: *** (07/24/22); Goal status:  INITIAL  5.  Patient will complete community, work and/or recreational activities without limitation due to current condition.  Baseline: *** (07/24/22); Goal status: INITIAL  6.  *** Baseline: *** Goal status: INITIAL   PLAN:  PT FREQUENCY: {rehab frequency:25116}  PT DURATION: {rehab duration:25117}  PLANNED INTERVENTIONS: {rehab planned interventions:25118::"Therapeutic exercises","Therapeutic activity","Neuromuscular re-education","Balance training","Gait training","Patient/Family education","Self Care","Joint mobilization"}.  PLAN FOR NEXT SESSION: ***   Cira Rue, PT, DPT 07/24/2022, 6:00 PM   Luretha Murphy. Ilsa Iha, PT, DPT 07/24/22, 6:00 PM  [facility] [address] [place], [place] [address] P: [phone] I F: [phone]

## 2022-07-28 ENCOUNTER — Other Ambulatory Visit: Payer: Self-pay

## 2022-07-28 ENCOUNTER — Telehealth (HOSPITAL_COMMUNITY): Payer: 59 | Admitting: Psychiatry

## 2022-07-28 ENCOUNTER — Encounter (HOSPITAL_COMMUNITY): Payer: Self-pay | Admitting: Psychiatry

## 2022-07-28 VITALS — Wt 257.0 lb

## 2022-07-28 DIAGNOSIS — F419 Anxiety disorder, unspecified: Secondary | ICD-10-CM

## 2022-07-28 DIAGNOSIS — F331 Major depressive disorder, recurrent, moderate: Secondary | ICD-10-CM

## 2022-07-28 MED ORDER — ARIPIPRAZOLE 5 MG PO TABS
ORAL_TABLET | ORAL | 0 refills | Status: DC
Start: 2022-07-28 — End: 2022-09-08
  Filled 2022-07-28: qty 30, 35d supply, fill #0

## 2022-07-28 MED ORDER — CLONAZEPAM 0.5 MG PO TABS
0.5000 mg | ORAL_TABLET | Freq: Three times a day (TID) | ORAL | 0 refills | Status: DC | PRN
Start: 2022-07-28 — End: 2022-09-08
  Filled 2022-07-28: qty 90, 30d supply, fill #0

## 2022-07-28 NOTE — Progress Notes (Signed)
[facility] MD Virtual Progress Note   Patient Location: In car Provider Location: Home Office  I connect with patient by telephone and verified that I am speaking with correct person by using two identifiers. I discussed the limitations of evaluation and management by telemedicine and the availability of in person appointments. I also discussed with the patient that there may be a patient responsible charge related to this service. The patient expressed understanding and agreed to proceed.  Crystal Diaz [id number] 32 y.o.  07/28/2022 11:40 AM  History of Present Illness:  Patient is evaluated by phone session.  She could not do the video appointment but apologized and promised to do it next time.  She is no longer taking Lamictal after having rash including her skin which is getting better.  She is supposed to see the dermatology but missed the appointment.  She reported feeling is a stop but to still have some time rash.  She is taking Klonopin that is helping her anxiety.  She reported last few weeks were very stressful because son was admitted at [facility] and she was not happy how they treated her son.  Patient told he was disrespectful when told to do dishes and he was not doing right.  Patient told he ran away when she noticed from the camera and called the police.  Patient told her son complained about her and required hospitalization.  Patient told he is doing better and patient trying to spend more time with him.  She also back to therapy with Baker [facility] and that has been helpful.  She admitted there are times when she gets very upset and jumped to the conclusion without thinking too much.  She understand if she remained calm she can make a better decision but sometimes it does not work.  She is learning skills from therapy.  She sleeps okay and job is going well.  She is not taking hydroxyzine.  She liked the Klonopin that is keeping her calm but  willing to try something to help her mood.  In the past she had tried Zoloft, Effexor, Lexapro from primary care and recently Lamictal which worked after she developed rash.  She denies any suicidal thoughts or homicidal thoughts.  She denies any hallucination.  Past Psychiatric History: Denies any history of suicidal attempt, inpatient treatment, psychosis, hallucination, PTSD. Tried Zoloft, Effexor and Lexapro from PCP but they were ineffective.    Outpatient Encounter Medications as of 07/28/2022  Medication Sig   clonazePAM (KLONOPIN) 0.5 MG tablet Take 1 tablet (0.5 mg total) by mouth 3 (three) times daily as needed.   cyanocobalamin (VITAMIN B12) 1000 MCG/ML injection Inject 1 mL (1,000 mcg total) into the muscle every 30 (thirty) days.   desogestrel-ethinyl estradiol (MIRCETTE) 0.15-0.02/0.01 MG (21/5) tablet Take 1 tablet by mouth daily.   hydrOXYzine (ATARAX) 25 MG tablet Take 1 tablet (25 mg total) by mouth 3 (three) times daily as needed for anxiety (insomnia). (Patient not taking: Reported on 07/16/2022)   ketoconazole (NIZORAL) 2 % cream Apply 1 Application topically 2 (two) times daily.   pantoprazole (PROTONIX) 40 MG tablet Take 1 tablet (40 mg total) by mouth once daily   pregabalin (LYRICA) 75 MG capsule Take 1 capsule (75 mg total) by mouth 2 (two) times daily.   Prenatal Vit-Fe Fumarate-FA (PRENATAL VITAMIN) 27-0.8 MG TABS Take 1 tablet by mouth daily.   SYRINGE-NEEDLE, DISP, 3 ML (B-D 3CC LUER-LOK SYR 23GX1") 23G X  1" 3 ML MISC Use once monthly with vitamin B12 as directed   Vitamin D, Ergocalciferol, (DRISDOL) 1.25 MG (50000 UNIT) CAPS capsule Take 1 capsule (50,000 Units total) by mouth once a week for 12 weeks   [DISCONTINUED] lamoTRIgine (LAMICTAL) 150 MG tablet Take 2 tablets (300 mg total) by mouth daily. (Patient not taking: Reported on 07/16/2022)   No facility-administered encounter medications on file as of 07/28/2022.    Recent Results (from the past 2160 hour(s))   Lipid panel     Status: None   Collection Time: 07/16/22 12:35 PM  Result Value Ref Range   Cholesterol 126 0 - 200 mg/dL    Comment: ATP III Classification       Desirable:  < 200 mg/dL               Borderline High:  200 - 239 mg/dL          High:  > = 960 mg/dL   Triglycerides 45.4 0.0 - 149.0 mg/dL    Comment: Normal:  <098 mg/dLBorderline High:  150 - 199 mg/dL   HDL 11.91 >47.82 mg/dL   VLDL 9.4 0.0 - 95.6 mg/dL   LDL Cholesterol 66 0 - 99 mg/dL   Total CHOL/HDL Ratio 2     Comment:                Men          Women1/2 Average Risk     3.4          3.3Average Risk          5.0          4.42X Average Risk          9.6          7.13X Average Risk          15.0          11.0                       NonHDL 75.43     Comment: NOTE:  Non-HDL goal should be 30 mg/dL higher than patient's LDL goal (i.e. LDL goal of < 70 mg/dL, would have non-HDL goal of < 100 mg/dL)  Comprehensive metabolic panel     Status: Abnormal   Collection Time: 07/16/22 12:35 PM  Result Value Ref Range   Sodium 138 135 - 145 mEq/L   Potassium 4.5 3.5 - 5.1 mEq/L   Chloride 106 96 - 112 mEq/L   CO2 26 19 - 32 mEq/L   Glucose, Bld 92 70 - 99 mg/dL   BUN 12 6 - 23 mg/dL   Creatinine, Ser 2.13 0.40 - 1.20 mg/dL   Total Bilirubin 0.3 0.2 - 1.2 mg/dL   Alkaline Phosphatase 33 (L) 39 - 117 U/L   AST 16 0 - 37 U/L   ALT 13 0 - 35 U/L   Total Protein 6.8 6.0 - 8.3 g/dL   Albumin 3.9 3.5 - 5.2 g/dL   GFR 086.57 >84.69 mL/min    Comment: Calculated using the CKD-EPI Creatinine Equation (2021)   Calcium 9.5 8.4 - 10.5 mg/dL  Vitamin G29     Status: None   Collection Time: 07/16/22 12:35 PM  Result Value Ref Range   Vitamin B-12 565 211 - 911 pg/mL  VITAMIN D 25 Hydroxy (Vit-D Deficiency, Fractures)     Status: Abnormal   Collection Time: 07/16/22 12:35 PM  Result Value Ref Range  VITD 27.61 (L) 30.00 - 100.00 ng/mL     Psychiatric Specialty Exam: Physical Exam  Review of Systems  Weight 257 lb (116.6 kg),  last menstrual period 07/08/2022.There is no height or weight on file to calculate BMI.  General Appearance: NA  Eye Contact:  NA  Speech:  Slow  Volume:  Decreased  Mood:  Anxious and Dysphoric  Affect:  NA  Thought Process:  Descriptions of Associations: Intact  Orientation:  Full (Time, Place, and Person)  Thought Content:  Rumination  Suicidal Thoughts:  No  Homicidal Thoughts:  No  Memory:  Immediate;   Good Recent;   Good Remote;   Good  Judgement:  Intact  Insight:  Present  Psychomotor Activity:  NA  Concentration:  Concentration: Good and Attention Span: Good  Recall:  Good  Fund of Knowledge:  Good  Language:  Good  Akathisia:  No  Handed:  Right  AIMS (if indicated):     Assets:  Communication Skills Desire for Improvement Housing Talents/Skills Transportation  ADL's:  Intact  Cognition:  WNL  Sleep:  ok     Assessment/Plan: MDD (major depressive disorder), recurrent episode, moderate (HCC) - Plan: clonazePAM (KLONOPIN) 0.5 MG tablet, ARIPiprazole (ABILIFY) 5 MG tablet  Anxiety - Plan: clonazePAM (KLONOPIN) 0.5 MG tablet, ARIPiprazole (ABILIFY) 5 MG tablet  Patient is no longer taking Lamictal after having a rash and peeling of the skin.  Her feeling much better but still have a rash and she is going to see the provider for further to look into it.  We talked about trying the Abilify which she has never tried before and she agree to try low-dose.  I recommend to start 2.5 mg for 2 weeks and then 5 mg if no major concern.  Encouraged to continue therapy which is working very well for her and Klonopin 0.5 mg 3 times a day.  Recommend to call us back if she has any question, concern or acutely worsening of the symptoms.  Follow-up in a month   Follow Up Instructions:     I discussed the assessment and treatment plan with the patient. The patient was provided an opportunity to ask questions and all were answered. The patient agreed with the plan and demonstrated  an understanding of the instructions.   The patient was advised to call back or seek an in-person evaluation if the symptoms worsen or if the condition fails to improve as anticipated.    Collaboration of Care: Other provider involved in patient's care AEB notes are available in epic to review.  Patient/Guardian was advised Release of Information must be obtained prior to any record release in order to collaborate their care with an outside provider. Patient/Guardian was advised if they have not already done so to contact the registration department to sign all necessary forms in order for [place] to release information regarding their care.   Consent: Patient/Guardian gives verbal consent for treatment and assignment of benefits for services provided during this visit. Patient/Guardian expressed understanding and agreed to proceed.     I provided 32 minutes of non face to face time during this encounter.  Note: This document was prepared by [employer] voice dictation technology and any errors that results from this process are unintentional.    Cleotis Nipper, MD 07/28/2022

## 2022-07-29 ENCOUNTER — Other Ambulatory Visit: Payer: Self-pay

## 2022-07-30 NOTE — Therapy (Signed)
OUTPATIENT PHYSICAL THERAPY EVALUATION   Patient Name: Crystal Diaz MRN: [id number] DOB:05/02/1990, 32 y.o., female Today's Date: 08/04/2022  END OF SESSION:  PT End of Session - 08/04/22 1939     Visit Number 1    Number of Visits 13    Date for PT Re-Evaluation 10/27/22    Authorization Type [place] [employer] F[employer] reporting period from 08/04/2022    Authorization Time Period ONLY 4 MODALITIES PER DAY  VL: 25 combined PT/OT per year    Authorization - Visit Number 1    Authorization - Number of Visits 25    Progress Note Due on Visit 10    PT Start Time 1815    PT Stop Time 1918    PT Time Calculation (min) 63 min    Activity Tolerance Patient tolerated treatment well;Patient limited by pain    Behavior During Therapy [facility] for tasks assessed/performed             Past Medical History:  Diagnosis Date   Anemia    Anxiety    Encounter for birth control 03/23/2018   GERD (gastroesophageal reflux disease)    Headache    Lumbar radiculopathy    PCOS (polycystic ovarian syndrome)    Plantar fasciitis, bilateral    Past Surgical History:  Procedure Laterality Date   FOOT SURGERY Right    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   Procedure: LAPAROSCOPIC ROUX-EN-Y GASTRIC BYPASS WITH HIATAL HERNIA REPAIR AND UPPER ENDOSCOPY;  Surgeon: Luretha Murphy, MD;  Location: [facility];  Service: General;  Laterality: N/A;   WISDOM TOOTH EXTRACTION     Patient Active Problem List   Diagnosis Date Noted   Frequent headaches 09/06/2021   High risk medication use 08/02/2021   MDD (major depressive disorder) 04/26/2021   Vitamin D deficiency 06/08/2020   Abnormal uterine bleeding 12/02/2019   Screening for STD (sexually transmitted disease) 10/19/2018   GERD (gastroesophageal reflux disease) 03/23/2018   Encounter for annual general medical examination with abnormal findings in adult 03/23/2018   Vitamin B12 deficiency due to intestinal malabsorption 12/16/2017   GAD (generalized anxiety  disorder) 11/25/2017   S/P gastric bypass 03/17/2017    PCP: Doreene Nest, NP  REFERRING PROVIDER: Doren Custard, NP ([facility])  REFERRING DIAG: chronic bilateral low back pain with bilateral sciatica  Rationale for Evaluation and Treatment: Rehabilitation  THERAPY DIAG:  Other low back pain  Neuralgia and neuritis  Muscle weakness (generalized)  ONSET DATE: chronic back pain for years, but worst episode Dec. 25, 2023  SUBJECTIVE:  SUBJECTIVE STATEMENT: Patient states her back pain started with her first PT sessions at the current PT office. Her back started hurting when she was in PT for her shoulder. She states her back pain is now the focus. She has had an  MRI. She states on Christmas day she woke up and could not move her legs without extreme pain. The pressure of putting weight down forced her back to bed and she could not play santa like she wanted to. She states her "fella" massaged her back that seemed to make the disc worse. She called the ambulance and went to the ED. She got some medications (a type of valium and dilaudid) and was able to get right back up without the shooting pain. She was given hydrocodone which she does not like to take because it makes her constipated. She woke up the next day with the same pain but could move a bit better, so she went back to the ED. She was there over 14 hours so she left and followed up with the [facility] specialist who sent her here. She states her back was already hurting throughout the shoulder problem, but it was not a major issue. She has a history of back discomfort for years but never as bad as the eipsode that started in Christmastime. Since Christmas her back pain has not been as bad as when she had that episode,  but it can still bey very uncomfortable to get out of bed and moving around. She got gabapentin after seeing the doctor at [facility], which was not helpful. Her doctor was talking about going in and moving the disc but her insurance company required PT first. She is taking the lyrical now which is helping. Patient is feeling what she normally feels when taking the lyrica. She gets through work day and she is able to go to the gym 2-3 times a week (some little weights with her legs and walking on the treadmill, but nothing to disturb her back). When she had the episode in December she has pain down both legs to the knee. It does not go down the legs often any longer. She got some injections in her back but they only helped for a week or two. The second shot was so excruciating it felt like "someone was drilling in [my] butt." She had an incident on Saturday when a vending machine fell on her when she fell trying to move it. She noticed her back was not as affected as her right arm. She did not feel any extra back pain after that. Her boss ordered her a chair to provide more support, but her back does not touch the part of the seat partly because of her anatomy. She states she is worried about her balance because her gait is altered where her gait is unsteady for a step or so. She is having increased urgency when needing to urinate, which started after Christmas (MD aware). She states she does have financial limitations. She does have tingling in her toes and sometimes in her hands (not lately), bilaterally equal, based on position more than correlation to back symptoms.   PERTINENT HISTORY:  Patient is a 32 y.o. female who presents to outpatient physical therapy with a referral for medical diagnosis chronic bilateral low back pain with bilateral sciatica. This patient's chief complaints consist of low back pain radiating intermittently down bilateral legs to knee leading to the following functional deficits:  difficulty, getting up from the chair, getting out  of bed, bed mobility, getting in vehicle, working, decreased pleasure, housework, cleaning, bending, reaching, turning, steps to enter home, swimming. Relevant past medical history and comorbidities include S/P gastric bypass; GAD (generalized anxiety disorder); Vitamin B12 deficiency due to intestinal malabsorption; GERD (gastroesophageal reflux disease); Abnormal uterine bleeding; Vitamin D deficiency; MDD (major depressive disorder); High risk medication use; and Frequent headaches; right shoulder pain; past medical history of Anemia, Anxiety, Encounter for birth control (03/23/2018), hx of GERD (gastroesophageal reflux disease), Headache, Lumbar radiculopathy, PCOS (polycystic ovarian syndrome, pt states she no longer has this), and Plantar fasciitis, bilateral; past surgical history that includes Foot surgery (Right); Wisdom tooth extraction; and Gastric Roux-En-Y (N/A, 03/17/2017). Patient denies hx of cancer, stroke, seizures, lung problems, heart problems, diabetes, unexplained weight loss, osteoporosis, and spinal surgery  PAIN:  Are you having pain? Yes NPRS: Current: 7/10,  Best: 6/10, Worst: 9/10. Pain location: shoulders/neck down to bilateral buttocks, occasionally down both legs to knee one or the other.  Pain description: burning, sharp during movement,  Aggravating factors: mornings, evenings, more exertion, getting up from chair, getting out of bed, bed mobility, getting in vehicle, working, decreased pleasure, housework, cleaning, bending, reaching, turning, steps to enter home.  Relieving factors: medications, propping up with a lot of pillows and ice, lumbar support when sitting,   FUNCTIONAL LIMITATIONS: difficulty, getting up from the chair, getting out of bed, bed mobility, getting in vehicle, working, decreased pleasure, housework, cleaning, bending, reaching, turning, steps to enter home, swimming  LEISURE: swimming,    PRECAUTIONS: None  WEIGHT BEARING RESTRICTIONS: No  FALLS:  Has patient fallen in last 6 months? Yes. 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   PLOF: Independent  PATIENT GOALS: "to get rid of the back pain; to maintain it without medication or hopefully surgery," she wants to be able to "accept the pain"  NEXT MD VISIT: waiting to see how PT does  OBJECTIVE  DIAGNOSTIC FINDINGS:  Lumbar MRI report from 02/26/2022:  CLINICAL DATA:  Central and bilateral lower back and buttock pain, bilateral foot numbness   EXAM: MRI LUMBAR SPINE WITHOUT CONTRAST   TECHNIQUE: Multiplanar, multisequence MR imaging of the lumbar spine was performed. No intravenous contrast was administered.   COMPARISON:  No prior MRI of the lumbar spine, correlation is made with CT lumbar spine 03/10/2021   FINDINGS: Segmentation: 5 lumbar type vertebral bodies. The lowest formed disc space is L5-S1.   Alignment: Trace retrolisthesis of L5 on S1, unchanged. Straightening of the normal lumbar lordosis. Mild dextroscoliosis.   Vertebrae:  No fracture, evidence of discitis, or bone lesion.   Conus medullaris and cauda equina: Conus extends to the L1-L2 level. Conus and cauda equina appear normal.   Paraspinal and other soft tissues: Negative.   Disc levels:   T12-L1: No significant disc bulge. No spinal canal stenosis or neural foraminal narrowing.   L1-L2: No significant disc bulge. Mild facet arthropathy. No spinal canal stenosis or neural foraminal narrowing.   L2-L3: No significant disc bulge. Mild facet arthropathy. No spinal canal stenosis or neural foraminal narrowing.   L3-L4: No significant disc bulge. Mild facet arthropathy. No spinal canal stenosis or neural  foraminal narrowing.   L4-L5: No significant disc bulge. Mild facet arthropathy. Narrowing of the left lateral recess. No spinal  canal stenosis or neural foraminal narrowing.   L5-S1: Central disc extrusion with 6 mm caudal migration. This contacts the descending S1 nerve roots. Mild facet arthropathy. No spinal canal stenosis or neural foraminal narrowing.   IMPRESSION: 1. 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 2. Narrowing of the left lateral recess at L4-L5, which could affect the descending left L5 nerve roots. 3. Multilevel mild facet arthropathy.     Electronically Signed   By: Wiliam Ke M.D.   On: 02/26/2022 19:38  OBSERVATION/INSPECTION Posture Posture (standing): inreased lumbar lordosis, mild bilateral genurecuvatum Anthropometrics Tremor: none Body composition: large posterior excursion of buttocks affects lumbar curve and comfort during sitting and supine.  Edema: none Functional Mobility Bed mobility: supine <> sit and rolling mod I for increased time/effort Transfers: sit <> stand mod I with increased time/effort and aberrant movement with hands on knees walking up body as patient rises. Reports pain.  Gait: grossly WFL for household and short community ambulation except first few steps after rising from chair is altered.  More detailed gait analysis deferred to later date as needed.   SPINE MOTION  LUMBAR SPINE AROM *Indicates pain Flexion: fingers to floor, feels good, back pain and abarent movement only on return. (Pain better after).  Extension: 50% no increase in pain.  Side Flexion:   R finger 2 inches distal to patella  L finger 1 inch distal to patella, pain at right low back  Rotation:  R 50% L 50% pain at right low back  NEUROLOGICAL Dermatomes L2-S2 appears equal and intact to light touch.   PERIPHERAL JOINT MOTION (in degrees) PASSIVE RANGE OF MOTION (PROM) B hip extension  painful at low back at end range, which is Mildly limited for basic mobility.   MUSCLE PERFORMANCE (MMT):  *Indicates pain 08/04/22 Date Date  Joint/Motion R/L R/L R/L  Hip     Flexion (L1, L2) 5/5 / /  Extension (knee ext) / / /  Extension (knee flex) 5*/5* / /  Abduction 4*/4+* / /  Adduction / / /  Knee     Extension (L3) 5/5 / /  Flexion (S2) 5/5 / /  Ankle/Foot     Dorsiflexion (L4) 5/5 / /  Great toe extension (L5) 5/5 / /  Eversion (S1) 5/5 / /  Plantarflexion (S1) 5/5 / /  Comments:  08/04/2022: able to heel and toe walk with no UE support.   SPECIAL TESTS:  LOWER LIMB NEURODYNAMIC TESTS Straight Leg Raise (Sciatic nerve)  R  = positive for tension sensitive to foot position, flexible to 90 degrees.  L  = positive for back and proximal thigh pian sensitive to foot position, flexible to 90 degrees   SIJ SPECIAL TESTS SIJ compression: R = negative, L = negative. Sacral thrust: R = positive, L = positive.  ACCESSORY MOTION: CPA to sacrum with most concordant pain. Painful also at L4 and L5.   PALPATION: TTP at bilateral lower lumbar paraspinals, bilateral posterior hip/glute region, left greater trochanter.   FUNCTIONAL TESTS: sahrmann core stability test: level 0 (difficulty performing posterior pelvic tilt without involving upper body)   TODAY'S TREATMENT:    Therapeutic exercise: to centralize symptoms and improve ROM, strength, muscular endurance, and activity tolerance required for successful completion of functional activities.  - hooklying posterior pelvic tilt with 5 second hold, 1x10 - hooklying lower trunk rotation 1x5 each direction - seated posture education with lumbar roll - education on ice  pack including handout - Education on diagnosis, prognosis, POC, anatomy and physiology of current condition.  - Education on HEP   Pt required multimodal cuing for proper technique and to facilitate improved neuromuscular control, strength, range of motion,  and functional ability resulting in improved performance and form.  PATIENT EDUCATION:  Education details: Exercise purpose/form. Self management techniques. Education on diagnosis, prognosis, POC, anatomy and physiology of current condition. Person educated: Patient Education method: Explanation, Demonstration, Tactile cues, and Verbal cues Education comprehension: verbalized understanding, returned demonstration, and needs further education  HOME EXERCISE PROGRAM: Access Code: [email or web address] URL: [email or web address] Date: 08/04/2022 Prepared by: Norton Blizzard  Exercises - Supine Posterior Pelvic Tilt  - 1 x daily - 1 sets - 20 reps - 1 breath5 seconds hold - Supine Lower Trunk Rotation  - 2 x daily - 1 sets - 20 reps - 1-5 seconds hold - Seated Posture with Lumbar Roll   ASSESSMENT:  CLINICAL IMPRESSION: Patient is a 32 y.o. female referred to outpatient physical therapy with a medical diagnosis of chronic bilateral low back pain with bilateral sciatica who presents with signs and symptoms consistent with low back pain with radiation into bilateral LE to knees. Upon exam today, patient with no hard neuro signs and SLR reproduced pain at the proximal left thigh and buttocks only at 90 degrees hip flexion. Patient demonstrates significant pain with transitional movements including returning from standing lumbar flexion, sit to stand, and bed mobility which suggests discogenic pain. Patient has allodynia and hyperesthesia in her bilateral posterior hip/glute region. Patient presents with significant pain, joint stiffness, ROM, motor control, postural, muscle tension, muscle performance (strength/power/endurance), and activity tolerance impairments that are limiting ability to complete usual activities such as getting up from the chair, getting out of bed, bed mobility, getting in vehicle, working, decreased pleasure, housework, cleaning, bending, reaching, turning, steps to enter home,  swimming without difficulty. Patient will benefit from skilled physical therapy intervention to address current body structure impairments and activity limitations to improve function and work towards goals set in current POC in order to return to prior level of function or maximal functional improvement.    OBJECTIVE IMPAIRMENTS: decreased activity tolerance, decreased balance, decreased endurance, decreased knowledge of condition, decreased mobility, decreased ROM, decreased strength, impaired perceived functional ability, increased muscle spasms, improper body mechanics, postural dysfunction, obesity, and pain.   ACTIVITY LIMITATIONS: carrying, lifting, bending, standing, squatting, sleeping, stairs, transfers, bed mobility, bathing, toileting, dressing, and hygiene/grooming  PARTICIPATION LIMITATIONS: cleaning, laundry, interpersonal relationship, community activity, occupation, yard work, and   difficulty, getting up from the chair, getting out of bed, bed mobility, getting in vehicle, working, decreased pleasure, housework, cleaning, bending, reaching, turning, steps to enter home, swimming  PERSONAL FACTORS: Fitness, Past/current experiences, Time since onset of injury/illness/exacerbation, and 3+ comorbidities:   S/P gastric bypass; GAD (generalized anxiety disorder); Vitamin B12 deficiency due to intestinal malabsorption; GERD (gastroesophageal reflux disease); Abnormal uterine bleeding; Vitamin D deficiency; MDD (major depressive disorder); High risk medication use; and Frequent headaches; right shoulder pain; past medical history of Anemia, Anxiety, Encounter for birth control (03/23/2018), hx of GERD (gastroesophageal reflux disease), Headache, Lumbar radiculopathy, PCOS (polycystic ovarian syndrome, pt states she no longer has this), and Plantar fasciitis, bilateral; past surgical history that includes Foot surgery (Right); Wisdom tooth extraction; and Gastric Roux-En-Y (N/A, 03/17/2017) are also  affecting patient's functional outcome.   REHAB POTENTIAL: Good  CLINICAL DECISION MAKING: Evolving/moderate complexity  EVALUATION COMPLEXITY: Moderate   GOALS: Goals reviewed with patient? No  SHORT TERM GOALS: Target date: 08/18/2022  Patient will be independent with initial home exercise program for self-management of symptoms. Baseline: Initial HEP provided at IE (08/04/22); Goal status: INITIAL   LONG TERM GOALS: Target date: 10/27/2022  Patient will be independent with a long-term home exercise program for self-management of symptoms.  Baseline: Initial HEP provided at IE (08/04/22); Goal status: INITIAL  2.  Patient will demonstrate improved FOTO by equal or greater than 10 by visit #10 to demonstrate improvement in overall condition and self-reported functional ability.  Baseline: to be measured at visit 2 as appropriate (08/04/22); Goal status: INITIAL  3.  Patient will complete 5 Time Sit To Stand Test from 18.5 inch or less surface in 11 seconds or less to demonstrate improvement in sit <> stand motion to improve her ability to get up and move around at home and at work.  Baseline: to be tested at visit 2 as appropriate (08/04/22); Goal status: INITIAL  4.  Patient will demonstrate the ability to perform level 2 on the sahrmann core stability test to improve her core strength to improve her ability for transfers and bed mobility during ADLs and work.  Baseline: difficulty performing posterior pelvic tilt without involving upper body (08/04/22); Goal status: INITIAL  5.  Patient will complete community, work and/or recreational activities with 75% less limitation due to current condition.  Baseline: difficulty getting up from the chair, getting out of bed, bed mobility, getting in vehicle, working, decreased pleasure, housework, cleaning, bending, reaching, turning, steps to enter home, swimming (08/04/22); Goal status: INITIAL   PLAN:  PT FREQUENCY:  1-2x/week  PT DURATION: 12 weeks  PLANNED INTERVENTIONS: Therapeutic exercises, Therapeutic activity, Neuromuscular re-education, Patient/Family education, Self Care, Joint mobilization, Dry Needling, Electrical stimulation, Spinal mobilization, Cryotherapy, Moist heat, Manual therapy, and Re-evaluation.  PLAN FOR NEXT SESSION: progressive core/LE/functional strengthening and motor control exercises. Education. Manual therapy/dry needling/specific exercise as needed.    Cira Rue, PT, DPT 08/04/2022, 7:44 PM    [facility] [address] [place], [place] [address] P: [phone] I F: [phone]

## 2022-08-04 ENCOUNTER — Encounter: Payer: Self-pay | Admitting: Physical Therapy

## 2022-08-04 ENCOUNTER — Ambulatory Visit: Payer: 59 | Attending: Primary Care | Admitting: Physical Therapy

## 2022-08-04 DIAGNOSIS — M792 Neuralgia and neuritis, unspecified: Secondary | ICD-10-CM | POA: Insufficient documentation

## 2022-08-04 DIAGNOSIS — M6281 Muscle weakness (generalized): Secondary | ICD-10-CM | POA: Insufficient documentation

## 2022-08-04 DIAGNOSIS — M5459 Other low back pain: Secondary | ICD-10-CM | POA: Insufficient documentation

## 2022-08-05 NOTE — Therapy (Signed)
OUTPATIENT PHYSICAL THERAPY TREATMENT NOTE   Patient Name: GLORINE HANRATTY MRN: [id number] DOB:[birth year], 32 y.o., female Today's Date: 08/06/2022  END OF SESSION:  PT End of Session - 08/06/22 1840     Visit Number 2    Number of Visits 13    Date for PT Re-Evaluation 10/27/22    Authorization Type [place] [employer] reporting period from 08/04/2022    Authorization Time Period ONLY 4 MODALITIES PER DAY  VL: 25 combined PT/OT per year    Authorization - Visit Number 2    Authorization - Number of Visits 25    Progress Note Due on Visit 10    PT Start Time 1835    PT Stop Time 1925    PT Time Calculation (min) 50 min    Activity Tolerance Patient tolerated treatment well;Patient limited by pain    Behavior During Therapy [facility] for tasks assessed/performed              Past Medical History:  Diagnosis Date   Anemia    Anxiety    Encounter for birth control 03/23/2018   GERD (gastroesophageal reflux disease)    Headache    Lumbar radiculopathy    PCOS (polycystic ovarian syndrome)    Plantar fasciitis, bilateral    Past Surgical History:  Procedure Laterality Date   FOOT SURGERY Right    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   Procedure: LAPAROSCOPIC ROUX-EN-Y GASTRIC BYPASS WITH HIATAL HERNIA REPAIR AND UPPER ENDOSCOPY;  Surgeon: Luretha Murphy, MD;  Location: [facility];  Service: General;  Laterality: N/A;   WISDOM TOOTH EXTRACTION     Patient Active Problem List   Diagnosis Date Noted   Frequent headaches 09/06/2021   High risk medication use 08/02/2021   MDD (major depressive disorder) 04/26/2021   Vitamin D deficiency 06/08/2020   Abnormal uterine bleeding 12/02/2019   Screening for STD (sexually transmitted disease) 10/19/2018   GERD (gastroesophageal reflux disease) 03/23/2018   Encounter for annual general medical examination with abnormal findings in adult 03/23/2018   Vitamin B12 deficiency due to intestinal malabsorption 12/16/2017   GAD (generalized  anxiety disorder) 11/25/2017   S/P gastric bypass 03/17/2017    PCP: Doreene Nest, NP  REFERRING PROVIDER: Doren Custard, NP ([facility])  REFERRING DIAG: chronic bilateral low back pain with bilateral sciatica  Rationale for Evaluation and Treatment: Rehabilitation  THERAPY DIAG:  Other low back pain  Neuralgia and neuritis  Muscle weakness (generalized)  ONSET DATE: chronic back pain for years, but worst episode Dec. 25, 2023  PERTINENT HISTORY:  Patient is a 32 y.o. female who presents to outpatient physical therapy with a referral for medical diagnosis chronic bilateral low back pain with bilateral sciatica. This patient's chief complaints consist of low back pain radiating intermittently down bilateral legs to knee leading to the following functional deficits: difficulty, getting up from the chair, getting out of bed, bed mobility, getting in vehicle, working, decreased pleasure, housework, cleaning, bending, reaching, turning, steps to enter home, swimming. Relevant past medical history and comorbidities include S/P gastric bypass; GAD (generalized anxiety disorder); Vitamin B12 deficiency due to intestinal malabsorption; GERD (gastroesophageal reflux disease); Abnormal uterine bleeding; Vitamin D deficiency; MDD (major depressive disorder); High risk medication use; and Frequent headaches; right shoulder pain; past medical history of Anemia, Anxiety, Encounter for birth control (03/23/2018), hx of GERD (gastroesophageal reflux disease), Headache, Lumbar radiculopathy, PCOS (polycystic ovarian syndrome, pt states she no longer has this), and Plantar fasciitis,  bilateral; past surgical history that includes Foot surgery (Right); Wisdom tooth extraction; and Gastric Roux-En-Y (N/A, 03/17/2017). Patient denies hx of cancer, stroke, seizures, lung problems, heart problems, diabetes, unexplained weight loss, osteoporosis, and spinal surgery  SUBJECTIVE:                                                                                                                                                                                            SUBJECTIVE STATEMENT: Patient states she is doing okay today. She has a pain in her left buttocks that feels like a muscle. She was sitting at her desk doing just fine and the pain started all of a sudden. She cannot remember if she got up or turned when it happened. She was a little sore after last PT session.   PAIN:  NPRS: 6-7/10 lingering in her back and left buttocks.   PRECAUTIONS: None  PATIENT GOALS: "to get rid of the back pain; to maintain it without medication or hopefully surgery," she wants to be able to "accept the pain"  NEXT MD VISIT: waiting to see how PT does  OBJECTIVE  Vitals:   08/06/22 1840  BP: 105/60  Pulse: 82  SpO2: 99%    SELF-REPORTED FUNCTION FOTO score: 44/100 (lumbar spine questionnaire)  REPEATED MOTION TESTING: - repeated lumbar extension in standing 1x10 (pain with each rep to left buttocks, no worse) - repeated flexion in sitting, 1x3 (discontinued due to pain/aberrant movement)  TODAY'S TREATMENT:    Therapeutic exercise: to centralize symptoms and improve ROM, strength, muscular endurance, and activity tolerance required for successful completion of functional activities.  - vitals check to assess baseline (see above).  - NuStep level 1 using bilateral upper and lower extremities. Seat/handle setting 11/11. For improved extremity mobility, muscular endurance, and activity tolerance; and to induce the analgesic effect of aerobic exercise, stimulate improved joint nutrition, and prepare body structures and systems for following interventions. x 7  minutes. Average SPM = 65. - standing lumbar extension over TM bar, 1x10 increased pain at leg buttocks each rep and no better after.  - seated repeated lumbar flexion, 1x3 pain every rep at left buttocks.  (Manual therapy  / dry needling - see below).  - prone (face in cradle, pillow under ankles) multifidus kick (engage abdominals by pulling navel towards spine, then slightly extend hip with knee extended to lift LE slightly off mat), 2x10 each side (alternating with each rep) - Education on HEP   Modality: Dry needling performed the the low back to decrease pain and spasms along patient's lumbar region with patient in prone. (4) .3mm x 75mm dry needles  inserted into the lumbar multifidi at approximately L4 and L5 on right and left sides of the spine. Estim applied: 1 min of 6/4 milliA at ~6 hz, and 5 min of 6/4 microA at ~60 Hz. Two channels along lumbar multifidi each channel on one side of the spine.  Patient educated about the risks and benefits from dry needling therapy and verbally consents to treatment. Patient was too anxous about the feeling of delivery with milliA, so it was moved to microA which was better tolerated .Dry needling performed by Cira Rue, PT, DPT who is certified in this technique.  Pt required multimodal cuing for proper technique and to facilitate improved neuromuscular control, strength, range of motion, and functional ability resulting in improved performance and form.  PATIENT EDUCATION:  Education details: Exercise purpose/form. Self management techniques. Education on diagnosis, prognosis, POC, anatomy and physiology of current condition. Reviewed cancelation/no-show policy with patient and confirmed patient has correct phone number for clinic; patient verbalized understanding (08/04/22). Person educated: Patient Education method: Explanation, Demonstration, Tactile cues, and Verbal cues Education comprehension: verbalized understanding, returned demonstration, and needs further education  HOME EXERCISE PROGRAM: Access Code: [email or web address] URL: [email or web address] Date: 08/06/2022 Prepared by: Norton Blizzard  Exercises - Supine Posterior Pelvic Tilt  - 1 x daily - 1  sets - 20 reps - 1 breath5 seconds hold - Supine Lower Trunk Rotation  - 2 x daily - 1 sets - 20 reps - 1-5 seconds hold - Seated Posture with Lumbar Roll  - Prone Hip Extension  - 3-5 x weekly - 2-3 sets - 10 reps - 1-5 seconds hold  ASSESSMENT:  CLINICAL IMPRESSION: Patient arrives reporting pain in left buttock that developed 1-2 hours prior to coming to PT. Session included trial of flexion and extension repeated motions without good response to either, which matches to MRI showing disc extrusion at L5-S1. Dry needling with estim was utilized to help with pain relief and to better allow patient to engage in core/low back strengthening. Patient's anxiety disorder affected her comfort with dry needling and she struggled some with needle insertion and found the milliA estim setting unsettling with the sensation of thumping, so it was turned to the microA setting (which was better tolerated) after 1 min instead of the planned 5 minutes. Patient reported improved pain in her lumbar spine region by the end of the session, but was continuing to have similar pain in the left glute. Patient tolerated multifidus kick exercise well which was added to her HEP. Plan to continue with core and functional strengthening as tolerated next session, and utilize manual therapy as needed for pain control. Plan to consider use of dry needling again when clinician certified in this technique next sees patient. Patient would benefit from continued management of limiting condition by skilled physical therapist to address remaining impairments and functional limitations to work towards stated goals and return to PLOF or maximal functional independence.   From initial PT evaluation performed 08/04/2022:  Patient is a 32 y.o. female referred to outpatient physical therapy with a medical diagnosis of chronic bilateral low back pain with bilateral sciatica who presents with signs and symptoms consistent with low back pain with  radiation into bilateral LE to knees. Upon exam today, patient with no hard neuro signs and SLR reproduced pain at the proximal left thigh and buttocks only at 90 degrees hip flexion. Patient demonstrates significant pain with transitional movements including returning from standing lumbar flexion, sit to stand, and bed mobility which suggests  discogenic pain. Patient has allodynia and hyperesthesia in her bilateral posterior hip/glute region. Patient presents with significant pain, joint stiffness, ROM, motor control, postural, muscle tension, muscle performance (strength/power/endurance), and activity tolerance impairments that are limiting ability to complete usual activities such as getting up from the chair, getting out of bed, bed mobility, getting in vehicle, working, decreased pleasure, housework, cleaning, bending, reaching, turning, steps to enter home, swimming without difficulty. Patient will benefit from skilled physical therapy intervention to address current body structure impairments and activity limitations to improve function and work towards goals set in current POC in order to return to prior level of function or maximal functional improvement.    OBJECTIVE IMPAIRMENTS: decreased activity tolerance, decreased balance, decreased endurance, decreased knowledge of condition, decreased mobility, decreased ROM, decreased strength, impaired perceived functional ability, increased muscle spasms, improper body mechanics, postural dysfunction, obesity, and pain.   ACTIVITY LIMITATIONS: carrying, lifting, bending, standing, squatting, sleeping, stairs, transfers, bed mobility, bathing, toileting, dressing, and hygiene/grooming  PARTICIPATION LIMITATIONS: cleaning, laundry, interpersonal relationship, community activity, occupation, yard work, and   difficulty, getting up from the chair, getting out of bed, bed mobility, getting in vehicle, working, decreased pleasure, housework, cleaning, bending,  reaching, turning, steps to enter home, swimming  PERSONAL FACTORS: Fitness, Past/current experiences, Time since onset of injury/illness/exacerbation, and 3+ comorbidities:   S/P gastric bypass; GAD (generalized anxiety disorder); Vitamin B12 deficiency due to intestinal malabsorption; GERD (gastroesophageal reflux disease); Abnormal uterine bleeding; Vitamin D deficiency; MDD (major depressive disorder); High risk medication use; and Frequent headaches; right shoulder pain; past medical history of Anemia, Anxiety, Encounter for birth control (03/23/2018), hx of GERD (gastroesophageal reflux disease), Headache, Lumbar radiculopathy, PCOS (polycystic ovarian syndrome, pt states she no longer has this), and Plantar fasciitis, bilateral; past surgical history that includes Foot surgery (Right); Wisdom tooth extraction; and Gastric Roux-En-Y (N/A, 03/17/2017) are also affecting patient's functional outcome.   REHAB POTENTIAL: Good  CLINICAL DECISION MAKING: Evolving/moderate complexity  EVALUATION COMPLEXITY: Moderate   GOALS: Goals reviewed with patient? No  SHORT TERM GOALS: Target date: 08/18/2022  Patient will be independent with initial home exercise program for self-management of symptoms. Baseline: Initial HEP provided at IE (08/04/22); Goal status: In-progress   LONG TERM GOALS: Target date: 10/27/2022  Patient will be independent with a long-term home exercise program for self-management of symptoms.  Baseline: Initial HEP provided at IE (08/04/22); Goal status: In-progress  2.  Patient will demonstrate improved FOTO by equal or greater than 10 by visit #10 to demonstrate improvement in overall condition and self-reported functional ability.  Baseline: to be measured at visit 2 as appropriate (08/04/22); 44 at visit #2 (08/06/2022);  Goal status: In-progress  3.  Patient will complete 5 Time Sit To Stand Test from 18.5 inch or less surface in 11 seconds or less to demonstrate  improvement in sit <> stand motion to improve her ability to get up and move around at home and at work.  Baseline: to be tested at visit 2 as appropriate (08/04/22); Goal status: In-progress  4.  Patient will demonstrate the ability to perform level 2 on the sahrmann core stability test to improve her core strength to improve her ability for transfers and bed mobility during ADLs and work.  Baseline: difficulty performing posterior pelvic tilt without involving upper body (08/04/22); Goal status: In-progress  5.  Patient will complete community, work and/or recreational activities with 75% less limitation due to current condition.  Baseline: difficulty getting up from the chair, getting out  of bed, bed mobility, getting in vehicle, working, decreased pleasure, housework, cleaning, bending, reaching, turning, steps to enter home, swimming (08/04/22); Goal status: In-progress   PLAN:  PT FREQUENCY: 1-2x/week  PT DURATION: 12 weeks  PLANNED INTERVENTIONS: Therapeutic exercises, Therapeutic activity, Neuromuscular re-education, Patient/Family education, Self Care, Joint mobilization, Dry Needling, Electrical stimulation, Spinal mobilization, Cryotherapy, Moist heat, Manual therapy, and Re-evaluation.  PLAN FOR NEXT SESSION: progressive core/LE/functional strengthening and motor control exercises. Education. Manual therapy/dry needling/specific exercise as needed.    Cira Rue, PT, DPT 08/06/2022, 7:55 PM    [facility] [address] [place], [place] [address] P: [phone] I F: [phone]

## 2022-08-06 ENCOUNTER — Ambulatory Visit: Payer: 59 | Admitting: Podiatry

## 2022-08-06 ENCOUNTER — Ambulatory Visit: Payer: 59 | Admitting: Physical Therapy

## 2022-08-06 ENCOUNTER — Encounter: Payer: Self-pay | Admitting: Physical Therapy

## 2022-08-06 VITALS — BP 105/60 | HR 82

## 2022-08-06 DIAGNOSIS — M6281 Muscle weakness (generalized): Secondary | ICD-10-CM | POA: Diagnosis not present

## 2022-08-06 DIAGNOSIS — M5459 Other low back pain: Secondary | ICD-10-CM

## 2022-08-06 DIAGNOSIS — M792 Neuralgia and neuritis, unspecified: Secondary | ICD-10-CM

## 2022-08-08 ENCOUNTER — Other Ambulatory Visit: Payer: 59

## 2022-08-08 ENCOUNTER — Telehealth: Payer: Self-pay | Admitting: Obstetrics and Gynecology

## 2022-08-08 NOTE — Telephone Encounter (Signed)
Reached out to pt to reschedule lab appt that was scheduled on 08/08/2022 at 3:40.  Was able to reschedule to 08/11/2022 at 9:20.

## 2022-08-11 ENCOUNTER — Ambulatory Visit: Payer: 59

## 2022-08-11 ENCOUNTER — Other Ambulatory Visit: Payer: Self-pay

## 2022-08-11 ENCOUNTER — Other Ambulatory Visit: Payer: 59

## 2022-08-11 ENCOUNTER — Encounter: Payer: Self-pay | Admitting: Physical Therapy

## 2022-08-11 DIAGNOSIS — M792 Neuralgia and neuritis, unspecified: Secondary | ICD-10-CM | POA: Diagnosis not present

## 2022-08-11 DIAGNOSIS — M5459 Other low back pain: Secondary | ICD-10-CM

## 2022-08-11 DIAGNOSIS — M6281 Muscle weakness (generalized): Secondary | ICD-10-CM | POA: Diagnosis not present

## 2022-08-11 NOTE — Therapy (Signed)
OUTPATIENT PHYSICAL THERAPY TREATMENT NOTE   Patient Name: Crystal Diaz MRN: [id number] DOB:1990-05-18, 32 y.o., female Today's Date: 08/11/2022  END OF SESSION:  PT End of Session - 08/11/22 1825     Visit Number 3    Number of Visits 13    Date for PT Re-Evaluation 10/27/22    Authorization Type [place] [employer] F[employer] reporting period from 08/04/2022    Authorization Time Period ONLY 4 MODALITIES PER DAY  VL: 25 combined PT/OT per year    Authorization - Number of Visits 25    Progress Note Due on Visit 10    PT Start Time 1823    PT Stop Time 1907    PT Time Calculation (min) 44 min    Activity Tolerance Patient tolerated treatment well;Patient limited by pain    Behavior During Therapy [facility] for tasks assessed/performed               Past Medical History:  Diagnosis Date   Anemia    Anxiety    Encounter for birth control 03/23/2018   GERD (gastroesophageal reflux disease)    Headache    Lumbar radiculopathy    PCOS (polycystic ovarian syndrome)    Plantar fasciitis, bilateral    Past Surgical History:  Procedure Laterality Date   FOOT SURGERY Right    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   Procedure: LAPAROSCOPIC ROUX-EN-Y GASTRIC BYPASS WITH HIATAL HERNIA REPAIR AND UPPER ENDOSCOPY;  Surgeon: Luretha Murphy, MD;  Location: [facility];  Service: General;  Laterality: N/A;   WISDOM TOOTH EXTRACTION     Patient Active Problem List   Diagnosis Date Noted   Frequent headaches 09/06/2021   High risk medication use 08/02/2021   MDD (major depressive disorder) 04/26/2021   Vitamin D deficiency 06/08/2020   Abnormal uterine bleeding 12/02/2019   Screening for STD (sexually transmitted disease) 10/19/2018   GERD (gastroesophageal reflux disease) 03/23/2018   Encounter for annual general medical examination with abnormal findings in adult 03/23/2018   Vitamin B12 deficiency due to intestinal malabsorption 12/16/2017   GAD (generalized anxiety disorder) 11/25/2017   S/P  gastric bypass 03/17/2017    PCP: Doreene Nest, NP  REFERRING PROVIDER: Doren Custard, NP ([facility])  REFERRING DIAG: chronic bilateral low back pain with bilateral sciatica  Rationale for Evaluation and Treatment: Rehabilitation  THERAPY DIAG:  Other low back pain  Neuralgia and neuritis  Muscle weakness (generalized)  ONSET DATE: chronic back pain for years, but worst episode Dec. 25, 2023  PERTINENT HISTORY:  Patient is a 32 y.o. female who presents to outpatient physical therapy with a referral for medical diagnosis chronic bilateral low back pain with bilateral sciatica. This patient's chief complaints consist of low back pain radiating intermittently down bilateral legs to knee leading to the following functional deficits: difficulty, getting up from the chair, getting out of bed, bed mobility, getting in vehicle, working, decreased pleasure, housework, cleaning, bending, reaching, turning, steps to enter home, swimming. Relevant past medical history and comorbidities include S/P gastric bypass; GAD (generalized anxiety disorder); Vitamin B12 deficiency due to intestinal malabsorption; GERD (gastroesophageal reflux disease); Abnormal uterine bleeding; Vitamin D deficiency; MDD (major depressive disorder); High risk medication use; and Frequent headaches; right shoulder pain; past medical history of Anemia, Anxiety, Encounter for birth control (03/23/2018), hx of GERD (gastroesophageal reflux disease), Headache, Lumbar radiculopathy, PCOS (polycystic ovarian syndrome, pt states she no longer has this), and Plantar fasciitis, bilateral; past surgical history that includes Foot  surgery (Right); Wisdom tooth extraction; and Gastric Roux-En-Y (N/A, 03/17/2017). Patient denies hx of cancer, stroke, seizures, lung problems, heart problems, diabetes, unexplained weight loss, osteoporosis, and spinal surgery  SUBJECTIVE:                                                                                                                                                                                            SUBJECTIVE STATEMENT:   Patient reports feeling relief from dry needling last session. Reports typical back pain from sitting for periods of time during work.   PAIN:  NPRS: 6-7/10 lingering in her back and left buttocks.   PRECAUTIONS: None  PATIENT GOALS: "to get rid of the back pain; to maintain it without medication or hopefully surgery," she wants to be able to "accept the pain"  NEXT MD VISIT: waiting to see how PT does  OBJECTIVE  There were no vitals filed for this visit.   SELF-REPORTED FUNCTION FOTO score: 44/100 (lumbar spine questionnaire)  REPEATED MOTION TESTING: - repeated lumbar extension in standing 1x10 (pain with each rep to left buttocks, no worse) - repeated flexion in sitting, 1x3 (discontinued due to pain/aberrant movement)  TODAY'S TREATMENT:     Therapeutic exercise: to centralize symptoms and improve ROM, strength, muscular endurance, and activity tolerance required for successful completion of functional activities.  - NuStep level 1 using bilateral upper and lower extremities. Seat/handle setting 11/11. For improved extremity mobility, muscular endurance, and activity tolerance; and to induce the analgesic effect of aerobic exercise, stimulate improved joint nutrition, and prepare body structures and systems for following interventions. x 7  minutes. Average SPM = 65. -TrA activation with arms on white physioball 2 x 10 with 3 second hold  -TrA activation with alternating arm lifts 2 x 10  -Supine bridges  x 8, increased pain in low back  -Supine knee fall outs 2 x 10 each direction -Seated forward trunk flexion stretch with white physioball 3 x 10 seconds  - Education on HEP   Manual therapy:  STM lumbar paraspinals x 5 minutes  General stretching (trunk rotation, FABER, FADIR) x 10 minutes   Pt  required multimodal cuing for proper technique and to facilitate improved neuromuscular control, strength, range of motion, and functional ability resulting in improved performance and form.  PATIENT EDUCATION:  Education details: Exercise purpose/form. Self management techniques. Education on diagnosis, prognosis, POC, anatomy and physiology of current condition. Reviewed cancelation/no-show policy with patient and confirmed patient has correct phone number for clinic; patient verbalized understanding (08/04/22). Person educated: Patient Education method: Explanation, Demonstration, Tactile cues, and Verbal cues Education comprehension: verbalized understanding, returned demonstration,  and needs further education  HOME EXERCISE PROGRAM: Access Code: [email or web address] URL: [email or web address] Date: 08/06/2022 Prepared by: Norton Blizzard  Exercises - Supine Posterior Pelvic Tilt  - 1 x daily - 1 sets - 20 reps - 1 breath5 seconds hold - Supine Lower Trunk Rotation  - 2 x daily - 1 sets - 20 reps - 1-5 seconds hold - Seated Posture with Lumbar Roll  - Prone Hip Extension  - 3-5 x weekly - 2-3 sets - 10 reps - 1-5 seconds hold  ASSESSMENT:  CLINICAL IMPRESSION:   Patient arrives to therapy session motivated to participate with minimal reports of back pain. Session focused manual therapy, LE stretching, and core strengthening. Patient reports slight improvement in back pain at end of session. Patient would benefit from continued management of limiting condition by skilled physical therapist to address remaining impairments and functional limitations to work towards stated goals and return to PLOF or maximal functional independence.   From initial PT evaluation performed 08/04/2022:  Patient is a 32 y.o. female referred to outpatient physical therapy with a medical diagnosis of chronic bilateral low back pain with bilateral sciatica who presents with signs and symptoms consistent with low  back pain with radiation into bilateral LE to knees. Upon exam today, patient with no hard neuro signs and SLR reproduced pain at the proximal left thigh and buttocks only at 90 degrees hip flexion. Patient demonstrates significant pain with transitional movements including returning from standing lumbar flexion, sit to stand, and bed mobility which suggests discogenic pain. Patient has allodynia and hyperesthesia in her bilateral posterior hip/glute region. Patient presents with significant pain, joint stiffness, ROM, motor control, postural, muscle tension, muscle performance (strength/power/endurance), and activity tolerance impairments that are limiting ability to complete usual activities such as getting up from the chair, getting out of bed, bed mobility, getting in vehicle, working, decreased pleasure, housework, cleaning, bending, reaching, turning, steps to enter home, swimming without difficulty. Patient will benefit from skilled physical therapy intervention to address current body structure impairments and activity limitations to improve function and work towards goals set in current POC in order to return to prior level of function or maximal functional improvement.    OBJECTIVE IMPAIRMENTS: decreased activity tolerance, decreased balance, decreased endurance, decreased knowledge of condition, decreased mobility, decreased ROM, decreased strength, impaired perceived functional ability, increased muscle spasms, improper body mechanics, postural dysfunction, obesity, and pain.   ACTIVITY LIMITATIONS: carrying, lifting, bending, standing, squatting, sleeping, stairs, transfers, bed mobility, bathing, toileting, dressing, and hygiene/grooming  PARTICIPATION LIMITATIONS: cleaning, laundry, interpersonal relationship, community activity, occupation, yard work, and   difficulty, getting up from the chair, getting out of bed, bed mobility, getting in vehicle, working, decreased pleasure, housework,  cleaning, bending, reaching, turning, steps to enter home, swimming  PERSONAL FACTORS: Fitness, Past/current experiences, Time since onset of injury/illness/exacerbation, and 3+ comorbidities:   S/P gastric bypass; GAD (generalized anxiety disorder); Vitamin B12 deficiency due to intestinal malabsorption; GERD (gastroesophageal reflux disease); Abnormal uterine bleeding; Vitamin D deficiency; MDD (major depressive disorder); High risk medication use; and Frequent headaches; right shoulder pain; past medical history of Anemia, Anxiety, Encounter for birth control (03/23/2018), hx of GERD (gastroesophageal reflux disease), Headache, Lumbar radiculopathy, PCOS (polycystic ovarian syndrome, pt states she no longer has this), and Plantar fasciitis, bilateral; past surgical history that includes Foot surgery (Right); Wisdom tooth extraction; and Gastric Roux-En-Y (N/A, 03/17/2017) are also affecting patient's functional outcome.   REHAB POTENTIAL: Good  CLINICAL DECISION MAKING: Evolving/moderate complexity  EVALUATION COMPLEXITY: Moderate  GOALS: Goals reviewed with patient? No  SHORT TERM GOALS: Target date: 08/18/2022  Patient will be independent with initial home exercise program for self-management of symptoms. Baseline: Initial HEP provided at IE (08/04/22); Goal status: In-progress   LONG TERM GOALS: Target date: 10/27/2022  Patient will be independent with a long-term home exercise program for self-management of symptoms.  Baseline: Initial HEP provided at IE (08/04/22); Goal status: In-progress  2.  Patient will demonstrate improved FOTO by equal or greater than 10 by visit #10 to demonstrate improvement in overall condition and self-reported functional ability.  Baseline: to be measured at visit 2 as appropriate (08/04/22); 44 at visit #2 (08/06/2022);  Goal status: In-progress  3.  Patient will complete 5 Time Sit To Stand Test from 18.5 inch or less surface in 11 seconds or less to  demonstrate improvement in sit <> stand motion to improve her ability to get up and move around at home and at work.  Baseline: to be tested at visit 2 as appropriate (08/04/22); Goal status: In-progress  4.  Patient will demonstrate the ability to perform level 2 on the sahrmann core stability test to improve her core strength to improve her ability for transfers and bed mobility during ADLs and work.  Baseline: difficulty performing posterior pelvic tilt without involving upper body (08/04/22); Goal status: In-progress  5.  Patient will complete community, work and/or recreational activities with 75% less limitation due to current condition.  Baseline: difficulty getting up from the chair, getting out of bed, bed mobility, getting in vehicle, working, decreased pleasure, housework, cleaning, bending, reaching, turning, steps to enter home, swimming (08/04/22); Goal status: In-progress   PLAN:  PT FREQUENCY: 1-2x/week  PT DURATION: 12 weeks  PLANNED INTERVENTIONS: Therapeutic exercises, Therapeutic activity, Neuromuscular re-education, Patient/Family education, Self Care, Joint mobilization, Dry Needling, Electrical stimulation, Spinal mobilization, Cryotherapy, Moist heat, Manual therapy, and Re-evaluation.  PLAN FOR NEXT SESSION: progressive core/LE/functional strengthening and motor control exercises. Education. Manual therapy/dry needling/specific exercise as needed.    Viviann Spare, PT, DPT 08/11/2022, 6:26 PM    [facility] [address] [place], [place] [address] P: [phone] I F: [phone]

## 2022-08-13 ENCOUNTER — Ambulatory Visit: Payer: 59 | Admitting: Physical Therapy

## 2022-08-13 ENCOUNTER — Telehealth: Payer: Self-pay | Admitting: Physical Therapy

## 2022-08-13 NOTE — Telephone Encounter (Signed)
Called patient when she did not show up for her 7pm PT visit tonight. Patient answered and said she forgot about it after she was somewhat overwhelmed by increased pain after last PT session. She states she felt okay when she left but she thinks the bridge exercise increased her pain. Patient confirmed her next scheduled appt on Monday 08/18/22 at 6:15pm. She was asked to call if she is unable to come to her appointments.   Luretha Murphy. Ilsa Iha, PT, DPT 08/13/22, 7:21 PM  [facility] [address] [place], [place] [address] P: [phone] I F: [phone]

## 2022-08-15 ENCOUNTER — Other Ambulatory Visit: Payer: Self-pay

## 2022-08-15 ENCOUNTER — Ambulatory Visit (INDEPENDENT_AMBULATORY_CARE_PROVIDER_SITE_OTHER): Payer: 59 | Admitting: Podiatry

## 2022-08-15 ENCOUNTER — Encounter: Payer: Self-pay | Admitting: Podiatry

## 2022-08-15 DIAGNOSIS — F341 Dysthymic disorder: Secondary | ICD-10-CM | POA: Diagnosis not present

## 2022-08-15 DIAGNOSIS — Z91199 Patient's noncompliance with other medical treatment and regimen due to unspecified reason: Secondary | ICD-10-CM

## 2022-08-15 MED ORDER — DOXYCYCLINE HYCLATE 100 MG PO TABS
100.0000 mg | ORAL_TABLET | Freq: Two times a day (BID) | ORAL | 0 refills | Status: AC
  Filled 2022-08-15: qty 28, 14d supply, fill #0

## 2022-08-15 MED ORDER — TERBINAFINE HCL 250 MG PO TABS
250.0000 mg | ORAL_TABLET | Freq: Every day | ORAL | 0 refills | Status: AC
  Filled 2022-08-15: qty 30, 30d supply, fill #0

## 2022-08-15 NOTE — Progress Notes (Addendum)
Left foot superinfection lamisl doxy betadine wet to dry

## 2022-08-18 ENCOUNTER — Ambulatory Visit: Payer: 59 | Attending: Neurosurgery | Admitting: Physical Therapy

## 2022-08-18 ENCOUNTER — Encounter: Payer: Self-pay | Admitting: Physical Therapy

## 2022-08-18 DIAGNOSIS — M5459 Other low back pain: Secondary | ICD-10-CM | POA: Insufficient documentation

## 2022-08-18 DIAGNOSIS — M792 Neuralgia and neuritis, unspecified: Secondary | ICD-10-CM | POA: Insufficient documentation

## 2022-08-18 DIAGNOSIS — M6281 Muscle weakness (generalized): Secondary | ICD-10-CM | POA: Insufficient documentation

## 2022-08-18 NOTE — Therapy (Signed)
OUTPATIENT PHYSICAL THERAPY TREATMENT NOTE   Patient Name: Crystal Diaz MRN: [id number] DOB:October 26, 1990, 32 y.o., female Today's Date: 08/18/2022  END OF SESSION:  PT End of Session - 08/18/22 1822     Visit Number 4    Number of Visits 13    Date for PT Re-Evaluation 10/27/22    Authorization Type [place] [employer] F[employer] reporting period from 08/04/2022    Authorization Time Period ONLY 4 MODALITIES PER DAY  VL: 25 combined PT/OT per year    Authorization - Visit Number 3    Authorization - Number of Visits 25    Progress Note Due on Visit 10    PT Start Time 1821    PT Stop Time 1859    PT Time Calculation (min) 38 min    Activity Tolerance Patient tolerated treatment well;Patient limited by pain    Behavior During Therapy [facility] for tasks assessed/performed                Past Medical History:  Diagnosis Date   Anemia    Anxiety    Encounter for birth control 03/23/2018   GERD (gastroesophageal reflux disease)    Headache    Lumbar radiculopathy    PCOS (polycystic ovarian syndrome)    Plantar fasciitis, bilateral    Past Surgical History:  Procedure Laterality Date   FOOT SURGERY Right    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   Procedure: LAPAROSCOPIC ROUX-EN-Y GASTRIC BYPASS WITH HIATAL HERNIA REPAIR AND UPPER ENDOSCOPY;  Surgeon: Luretha Murphy, MD;  Location: [facility];  Service: General;  Laterality: N/A;   WISDOM TOOTH EXTRACTION     Patient Active Problem List   Diagnosis Date Noted   Frequent headaches 09/06/2021   High risk medication use 08/02/2021   MDD (major depressive disorder) 04/26/2021   Vitamin D deficiency 06/08/2020   Abnormal uterine bleeding 12/02/2019   Screening for STD (sexually transmitted disease) 10/19/2018   GERD (gastroesophageal reflux disease) 03/23/2018   Encounter for annual general medical examination with abnormal findings in adult 03/23/2018   Vitamin B12 deficiency due to intestinal malabsorption 12/16/2017   GAD (generalized  anxiety disorder) 11/25/2017   S/P gastric bypass 03/17/2017    PCP: Doreene Nest, NP  REFERRING PROVIDER: Doren Custard, NP ([facility])  REFERRING DIAG: chronic bilateral low back pain with bilateral sciatica  Rationale for Evaluation and Treatment: Rehabilitation  THERAPY DIAG:  Other low back pain  Neuralgia and neuritis  Muscle weakness (generalized)  ONSET DATE: chronic back pain for years, but worst episode Dec. 25, 2023  PERTINENT HISTORY:  Patient is a 32 y.o. female who presents to outpatient physical therapy with a referral for medical diagnosis chronic bilateral low back pain with bilateral sciatica. This patient's chief complaints consist of low back pain radiating intermittently down bilateral legs to knee leading to the following functional deficits: difficulty, getting up from the chair, getting out of bed, bed mobility, getting in vehicle, working, decreased pleasure, housework, cleaning, bending, reaching, turning, steps to enter home, swimming. Relevant past medical history and comorbidities include S/P gastric bypass; GAD (generalized anxiety disorder); Vitamin B12 deficiency due to intestinal malabsorption; GERD (gastroesophageal reflux disease); Abnormal uterine bleeding; Vitamin D deficiency; MDD (major depressive disorder); High risk medication use; and Frequent headaches; right shoulder pain; past medical history of Anemia, Anxiety, Encounter for birth control (03/23/2018), hx of GERD (gastroesophageal reflux disease), Headache, Lumbar radiculopathy, PCOS (polycystic ovarian syndrome, pt states she no longer has this), and  Plantar fasciitis, bilateral; past surgical history that includes Foot surgery (Right); Wisdom tooth extraction; and Gastric Roux-En-Y (N/A, 03/17/2017). Patient denies hx of cancer, stroke, seizures, lung problems, heart problems, diabetes, unexplained weight loss, osteoporosis, and spinal surgery  SUBJECTIVE:                                                                                                                                                                                            SUBJECTIVE STATEMENT:   Patient reports she had a lot of pain after last PT session that was somewhat overwhelming so she forgot about the next PT appointment and missed it. She is feeling better than that today but still has the burning pain in her low back. She states when she had the dry needling she had some relief until the next visit. She states so far she does not feel like PT except the dry needling is helping. She wonders if she could just get dry needling so she could do more in the gym. She has not been going to the gym. She is taking antibiotics for an infection on her foot that caused blisters that has kept her out of the gym. Before that it was the vending machine incident.   PAIN:  NPRS: 6/10 low back  PRECAUTIONS: None  PATIENT GOALS: "to get rid of the back pain; to maintain it without medication or hopefully surgery," she wants to be able to "accept the pain"  NEXT MD VISIT: waiting to see how PT does  OBJECTIVE  TODAY'S TREATMENT:     Therapeutic exercise: to centralize symptoms and improve ROM, strength, muscular endurance, and activity tolerance required for successful completion of functional activities.  - NuStep level 5 using bilateral upper and lower extremities. Seat/handle setting 13/13. For improved extremity mobility, muscular endurance, and activity tolerance; and to induce the analgesic effect of aerobic exercise, stimulate improved joint nutrition, and prepare body structures and systems for following interventions. x 7:50  minutes. Average SPM = 65. - child's pose stretch 1x10 seconds - prone press up 1x10 seconds  Manual therapy: to reduce pain and tissue tension, improve range of motion, neuromodulation, in order to promote improved ability to complete functional  activities. PRONE with face in blue cradle and pillow under ankles - STM to bilateral lumbar paraspinals.   Modality: Dry needling performed the the low back to decrease pain and spasms along patient's lumbar region with patient in prone. (4) .3mm x 75mm dry needles inserted into the lumbar multifidi at approximately L4 and L5 on right and left sides of the spine. Estim applied: 5 min of 4/5 milliA at ~  6 hz, and 5 min of 4/5 microA at ~60 Hz. Two channels along lumbar multifidi each channel on one side of the spine.  Patient educated about the risks and benefits from dry needling therapy and verbally consents to treatment. Dry needling performed by Cira Rue, PT, DPT who is certified in this technique.   Pt required multimodal cuing for proper technique and to facilitate improved neuromuscular control, strength, range of motion, and functional ability resulting in improved performance and form.  PATIENT EDUCATION:  Education details: Exercise purpose/form. Self management techniques. Education on diagnosis, prognosis, POC, anatomy and physiology of current condition. Reviewed cancelation/no-show policy with patient and confirmed patient has correct phone number for clinic; patient verbalized understanding (08/04/22). Person educated: Patient Education method: Explanation, Demonstration, Tactile cues, and Verbal cues Education comprehension: verbalized understanding, returned demonstration, and needs further education  HOME EXERCISE PROGRAM: Access Code: [email or web address] URL: [email or web address] Date: 08/06/2022 Prepared by: Norton Blizzard  Exercises - Supine Posterior Pelvic Tilt  - 1 x daily - 1 sets - 20 reps - 1 breath5 seconds hold - Supine Lower Trunk Rotation  - 2 x daily - 1 sets - 20 reps - 1-5 seconds hold - Seated Posture with Lumbar Roll  - Prone Hip Extension  - 3-5 x weekly - 2-3 sets - 10 reps - 1-5 seconds hold  ASSESSMENT:  CLINICAL IMPRESSION:   Patient  arrives reporting increased pain after last PT session and continued pain overall. Dry needling with estim was performed again today for pain relief with plan to continue with more exercise based interventions next session. Patient with good response to dry needling with decreased pain by end of session.  Patient would benefit from continued management of limiting condition by skilled physical therapist to address remaining impairments and functional limitations to work towards stated goals and return to PLOF or maximal functional independence.   From initial PT evaluation performed 08/04/2022:  Patient is a 32 y.o. female referred to outpatient physical therapy with a medical diagnosis of chronic bilateral low back pain with bilateral sciatica who presents with signs and symptoms consistent with low back pain with radiation into bilateral LE to knees. Upon exam today, patient with no hard neuro signs and SLR reproduced pain at the proximal left thigh and buttocks only at 90 degrees hip flexion. Patient demonstrates significant pain with transitional movements including returning from standing lumbar flexion, sit to stand, and bed mobility which suggests discogenic pain. Patient has allodynia and hyperesthesia in her bilateral posterior hip/glute region. Patient presents with significant pain, joint stiffness, ROM, motor control, postural, muscle tension, muscle performance (strength/power/endurance), and activity tolerance impairments that are limiting ability to complete usual activities such as getting up from the chair, getting out of bed, bed mobility, getting in vehicle, working, decreased pleasure, housework, cleaning, bending, reaching, turning, steps to enter home, swimming without difficulty. Patient will benefit from skilled physical therapy intervention to address current body structure impairments and activity limitations to improve function and work towards goals set in current POC in order to return  to prior level of function or maximal functional improvement.    OBJECTIVE IMPAIRMENTS: decreased activity tolerance, decreased balance, decreased endurance, decreased knowledge of condition, decreased mobility, decreased ROM, decreased strength, impaired perceived functional ability, increased muscle spasms, improper body mechanics, postural dysfunction, obesity, and pain.   ACTIVITY LIMITATIONS: carrying, lifting, bending, standing, squatting, sleeping, stairs, transfers, bed mobility, bathing, toileting, dressing, and hygiene/grooming  PARTICIPATION LIMITATIONS: cleaning, laundry, interpersonal relationship, community activity, occupation, yard  work, and   difficulty, getting up from the chair, getting out of bed, bed mobility, getting in vehicle, working, decreased pleasure, housework, cleaning, bending, reaching, turning, steps to enter home, swimming  PERSONAL FACTORS: Fitness, Past/current experiences, Time since onset of injury/illness/exacerbation, and 3+ comorbidities:   S/P gastric bypass; GAD (generalized anxiety disorder); Vitamin B12 deficiency due to intestinal malabsorption; GERD (gastroesophageal reflux disease); Abnormal uterine bleeding; Vitamin D deficiency; MDD (major depressive disorder); High risk medication use; and Frequent headaches; right shoulder pain; past medical history of Anemia, Anxiety, Encounter for birth control (03/23/2018), hx of GERD (gastroesophageal reflux disease), Headache, Lumbar radiculopathy, PCOS (polycystic ovarian syndrome, pt states she no longer has this), and Plantar fasciitis, bilateral; past surgical history that includes Foot surgery (Right); Wisdom tooth extraction; and Gastric Roux-En-Y (N/A, 03/17/2017) are also affecting patient's functional outcome.   REHAB POTENTIAL: Good  CLINICAL DECISION MAKING: Evolving/moderate complexity  EVALUATION COMPLEXITY: Moderate   GOALS: Goals reviewed with patient? No  SHORT TERM GOALS: Target date:  08/18/2022  Patient will be independent with initial home exercise program for self-management of symptoms. Baseline: Initial HEP provided at IE (08/04/22); Goal status: In-progress   LONG TERM GOALS: Target date: 10/27/2022  Patient will be independent with a long-term home exercise program for self-management of symptoms.  Baseline: Initial HEP provided at IE (08/04/22); Goal status: In-progress  2.  Patient will demonstrate improved FOTO by equal or greater than 10 by visit #10 to demonstrate improvement in overall condition and self-reported functional ability.  Baseline: to be measured at visit 2 as appropriate (08/04/22); 44 at visit #2 (08/06/2022);  Goal status: In-progress  3.  Patient will complete 5 Time Sit To Stand Test from 18.5 inch or less surface in 11 seconds or less to demonstrate improvement in sit <> stand motion to improve her ability to get up and move around at home and at work.  Baseline: to be tested at visit 2 as appropriate (08/04/22); Goal status: In-progress  4.  Patient will demonstrate the ability to perform level 2 on the sahrmann core stability test to improve her core strength to improve her ability for transfers and bed mobility during ADLs and work.  Baseline: difficulty performing posterior pelvic tilt without involving upper body (08/04/22); Goal status: In-progress  5.  Patient will complete community, work and/or recreational activities with 75% less limitation due to current condition.  Baseline: difficulty getting up from the chair, getting out of bed, bed mobility, getting in vehicle, working, decreased pleasure, housework, cleaning, bending, reaching, turning, steps to enter home, swimming (08/04/22); Goal status: In-progress   PLAN:  PT FREQUENCY: 1-2x/week  PT DURATION: 12 weeks  PLANNED INTERVENTIONS: Therapeutic exercises, Therapeutic activity, Neuromuscular re-education, Patient/Family education, Self Care, Joint mobilization, Dry  Needling, Electrical stimulation, Spinal mobilization, Cryotherapy, Moist heat, Manual therapy, and Re-evaluation.  PLAN FOR NEXT SESSION: progressive core/LE/functional strengthening and motor control exercises. Education. Manual therapy/dry needling/specific exercise as needed.    Cira Rue, PT, DPT 08/18/2022, 8:20 PM    [facility] [address] [place], [place] [address] P: [phone] I F: [phone]

## 2022-08-20 ENCOUNTER — Ambulatory Visit: Payer: 59 | Admitting: Physical Therapy

## 2022-08-21 ENCOUNTER — Encounter: Payer: Self-pay | Admitting: Podiatry

## 2022-08-25 ENCOUNTER — Ambulatory Visit: Payer: 59 | Admitting: Physical Therapy

## 2022-08-27 ENCOUNTER — Other Ambulatory Visit: Payer: Self-pay

## 2022-08-27 ENCOUNTER — Other Ambulatory Visit: Payer: Self-pay | Admitting: Podiatry

## 2022-08-27 ENCOUNTER — Encounter: Payer: 59 | Admitting: Physical Therapy

## 2022-08-27 MED ORDER — LIDOCAINE 5 % EX OINT
1.0000 | TOPICAL_OINTMENT | CUTANEOUS | 0 refills | Status: AC | PRN
  Filled 2022-08-27: qty 35.44, 30d supply, fill #0

## 2022-08-29 ENCOUNTER — Telehealth (HOSPITAL_COMMUNITY): Payer: 59 | Admitting: Psychiatry

## 2022-09-01 ENCOUNTER — Encounter: Payer: 59 | Admitting: Physical Therapy

## 2022-09-03 ENCOUNTER — Ambulatory Visit: Payer: 59 | Admitting: Physical Therapy

## 2022-09-03 ENCOUNTER — Telehealth: Payer: Self-pay | Admitting: Physical Therapy

## 2022-09-03 NOTE — Telephone Encounter (Signed)
Left VM for patient notifying her of missed visit and requesting a call back to reschedule or confirm next appointment when she did not show up for her PT appointment scheduled for 6:15pm today.   Luretha Murphy. Ilsa Iha, PT, DPT 09/03/22, 6:40 PM  [facility] [address] [place], [place] [address] P: [phone] I F: [phone]

## 2022-09-08 ENCOUNTER — Other Ambulatory Visit: Payer: Self-pay

## 2022-09-08 ENCOUNTER — Encounter (HOSPITAL_COMMUNITY): Payer: Self-pay | Admitting: Psychiatry

## 2022-09-08 ENCOUNTER — Telehealth (HOSPITAL_COMMUNITY): Payer: 59 | Admitting: Psychiatry

## 2022-09-08 ENCOUNTER — Encounter: Payer: 59 | Admitting: Physical Therapy

## 2022-09-08 VITALS — Wt 257.0 lb

## 2022-09-08 DIAGNOSIS — F331 Major depressive disorder, recurrent, moderate: Secondary | ICD-10-CM

## 2022-09-08 DIAGNOSIS — F419 Anxiety disorder, unspecified: Secondary | ICD-10-CM

## 2022-09-08 MED ORDER — CLONAZEPAM 0.5 MG PO TABS
0.5000 mg | ORAL_TABLET | Freq: Three times a day (TID) | ORAL | 2 refills | Status: DC | PRN
Start: 2022-09-08 — End: 2022-11-10
  Filled 2022-09-08: qty 90, 30d supply, fill #0

## 2022-09-08 MED ORDER — ARIPIPRAZOLE 5 MG PO TABS
ORAL_TABLET | ORAL | 2 refills | Status: DC
  Filled 2022-09-08 – 2022-10-06 (×2): qty 30, 30d supply, fill #0

## 2022-09-08 NOTE — Progress Notes (Signed)
[facility] MD Virtual Progress Note   Patient Location: Work Provider Location: Home Office  I connect with patient by video and verified that I am speaking with correct person by using two identifiers. I discussed the limitations of evaluation and management by telemedicine and the availability of in person appointments. I also discussed with the patient that there may be a patient responsible charge related to this service. The patient expressed understanding and agreed to proceed.  Crystal Diaz [id number] 32 y.o.  09/08/2022 10:58 AM  History of Present Illness:  Patient is evaluated by review of session.  She is at work.  She had not started Abilify because she was taking some antibiotic and does not want to mix with the medication.  She feels overall much better and much calmer.  School started and she reported today was the first day of her 36 year old son.  She liked to focus on her son and lately she noticed things are doing very well.  Her son started to play football and he is more than 200 pounds and only 34 years old.  She feels he is likable when he played football but sometime he has some issues with the coach.  Patient noticed father then went to talk because she tried to calm down her son and that has been very helpful.  She feels the summer was very difficult because he was admitted to [facility] but now she is thinking to take some time off and Thanksgiving and go to [place] for a vacation.  She reported her job is going well.  Sometimes she takes the Klonopin 2 tablet in the morning when she interacts with people but in the afternoon and later in the evening she is more calm.  She admitted sometimes irritability and frustration but trying to run other methods to calm her down.  She finished the program and no longer seeing therapists but she feels she need to go back as therapy had helped her a lot.  She is currently not in any relationship.  She has a last  court date coming up as her ex owed money and she would like to have this resolved.  She feels currently the situation is helpful.  She does not get upset as quickly and try to take some time before she make decisions.  She sleeps okay.  She has no more rash as Lamictal is stopped.  She denies any panic attack, crying spells, feeling of hopelessness or suicidal thoughts.  She feels the Klonopin to calm her down but like to start Abilify.  Her appetite is okay.  She has no tremors, shakes or any EPS.  She denies drinking or using any illegal substances.  Past Psychiatric History: Denies any history of suicidal attempt, inpatient treatment, psychosis, hallucination, PTSD. Tried Zoloft, Effexor and Lexapro from PCP but they were ineffective. Lamictal helped but caused rash.   Outpatient Encounter Medications as of 09/08/2022  Medication Sig   lidocaine (XYLOCAINE) 5 % ointment Apply 1 Application topically as needed.   ARIPiprazole (ABILIFY) 5 MG tablet Take 0.5 tablets (2.5 mg total) by mouth daily for 14 days, THEN 1 tablet (5 mg total) daily for 14 days.   clonazePAM (KLONOPIN) 0.5 MG tablet Take 1 tablet (0.5 mg total) by mouth 3 (three) times daily as needed.   cyanocobalamin (VITAMIN B12) 1000 MCG/ML injection Inject 1 mL (1,000 mcg total) into the muscle every 30 (thirty) days.   desogestrel-ethinyl estradiol (MIRCETTE) 0.15-0.02/0.01 MG (21/5)  tablet Take 1 tablet by mouth daily.   hydrOXYzine (ATARAX) 25 MG tablet Take 1 tablet (25 mg total) by mouth 3 (three) times daily as needed for anxiety (insomnia). (Patient not taking: Reported on 07/16/2022)   ketoconazole (NIZORAL) 2 % cream Apply 1 Application topically 2 (two) times daily.   pantoprazole (PROTONIX) 40 MG tablet Take 1 tablet (40 mg total) by mouth once daily   pregabalin (LYRICA) 75 MG capsule Take 1 capsule (75 mg total) by mouth 2 (two) times daily.   Prenatal Vit-Fe Fumarate-FA (PRENATAL VITAMIN) 27-0.8 MG TABS Take 1 tablet by  mouth daily.   SYRINGE-NEEDLE, DISP, 3 ML (B-D 3CC LUER-LOK SYR 23GX1") 23G X 1" 3 ML MISC Use once monthly with vitamin B12 as directed   terbinafine (LAMISIL) 250 MG tablet Take 1 tablet (250 mg total) by mouth daily.   Vitamin D, Ergocalciferol, (DRISDOL) 1.25 MG (50000 UNIT) CAPS capsule Take 1 capsule (50,000 Units total) by mouth once a week for 12 weeks   No facility-administered encounter medications on file as of 09/08/2022.    Recent Results (from the past 2160 hour(s))  Lipid panel     Status: None   Collection Time: 07/16/22 12:35 PM  Result Value Ref Range   Cholesterol 126 0 - 200 mg/dL    Comment: ATP III Classification       Desirable:  < 200 mg/dL               Borderline High:  200 - 239 mg/dL          High:  > = 161 mg/dL   Triglycerides 09.6 0.0 - 149.0 mg/dL    Comment: Normal:  <045 mg/dLBorderline High:  150 - 199 mg/dL   HDL 40.98 >11.91 mg/dL   VLDL 9.4 0.0 - 47.8 mg/dL   LDL Cholesterol 66 0 - 99 mg/dL   Total CHOL/HDL Ratio 2     Comment:                Men          Women1/2 Average Risk     3.4          3.3Average Risk          5.0          4.42X Average Risk          9.6          7.13X Average Risk          15.0          11.0                       NonHDL 75.43     Comment: NOTE:  Non-HDL goal should be 30 mg/dL higher than patient's LDL goal (i.e. LDL goal of < 70 mg/dL, would have non-HDL goal of < 100 mg/dL)  Comprehensive metabolic panel     Status: Abnormal   Collection Time: 07/16/22 12:35 PM  Result Value Ref Range   Sodium 138 135 - 145 mEq/L   Potassium 4.5 3.5 - 5.1 mEq/L   Chloride 106 96 - 112 mEq/L   CO2 26 19 - 32 mEq/L   Glucose, Bld 92 70 - 99 mg/dL   BUN 12 6 - 23 mg/dL   Creatinine, Ser 2.95 0.40 - 1.20 mg/dL   Total Bilirubin 0.3 0.2 - 1.2 mg/dL   Alkaline Phosphatase 33 (L) 39 - 117 U/L   AST 16  0 - 37 U/L   ALT 13 0 - 35 U/L   Total Protein 6.8 6.0 - 8.3 g/dL   Albumin 3.9 3.5 - 5.2 g/dL   GFR 846.96 >29.52 mL/min    Comment:  Calculated using the CKD-EPI Creatinine Equation (2021)   Calcium 9.5 8.4 - 10.5 mg/dL  Vitamin W41     Status: None   Collection Time: 07/16/22 12:35 PM  Result Value Ref Range   Vitamin B-12 565 211 - 911 pg/mL  VITAMIN D 25 Hydroxy (Vit-D Deficiency, Fractures)     Status: Abnormal   Collection Time: 07/16/22 12:35 PM  Result Value Ref Range   VITD 27.61 (L) 30.00 - 100.00 ng/mL     Psychiatric Specialty Exam: Physical Exam  Review of Systems  Weight 257 lb (116.6 kg).There is no height or weight on file to calculate BMI.  General Appearance: Casual  Eye Contact:  Good  Speech:  Clear and Coherent and Normal Rate  Volume:  Normal  Mood:  Euthymic  Affect:  Congruent  Thought Process:  Goal Directed  Orientation:  Full (Time, Place, and Person)  Thought Content:  WDL  Suicidal Thoughts:  No  Homicidal Thoughts:  No  Memory:  Immediate;   Good Recent;   Good Remote;   Good  Judgement:  Intact  Insight:  Present  Psychomotor Activity:  Normal  Concentration:  Concentration: Good and Attention Span: Fair  Recall:  Good  Fund of Knowledge:  Good  Language:  Good  Akathisia:  No  Handed:  Right  AIMS (if indicated):     Assets:  Communication Skills Desire for Improvement Housing Talents/Skills Transportation  ADL's:  Intact  Cognition:  WNL  Sleep:  ok     Assessment/Plan: MDD (major depressive disorder), recurrent episode, moderate (HCC) - Plan: clonazePAM (KLONOPIN) 0.5 MG tablet, ARIPiprazole (ABILIFY) 5 MG tablet  Anxiety - Plan: clonazePAM (KLONOPIN) 0.5 MG tablet, ARIPiprazole (ABILIFY) 5 MG tablet  Patient had not started Abilify yet.  She feels the Klonopin helped and calm her down but like to consider starting Abilify because she will still have moments when she gets upset.  She is learning slowly and gradually help to control her anger, irritability and depression.  We talked about considering going back to therapy since therapy had helped her a lot.   We talk about Klonopin dependency, tolerance and withdrawal.  Patient understanding and agreed to try Abilify so she does not have to take Klonopin too much.  She also agreed to go back to therapy.  She is handing her son much better than she anticipated.  She enjoys when son has a game.  Continue Klonopin 0.5 mg to take up to 3 times a day and she will start Abilify 2.5 mg for 1 week and then 5 mg daily.  I strongly encouraged to call us back if she notices any concern, side effects or any issues with the medication.  Patient will call [facility] to reconsider therapy.  I recommend to call us back if she has any questions or any concerns.  Follow-up in 3 months.   Follow Up Instructions:     I discussed the assessment and treatment plan with the patient. The patient was provided an opportunity to ask questions and all were answered. The patient agreed with the plan and demonstrated an understanding of the instructions.   The patient was advised to call back or seek an in-person evaluation if the symptoms worsen or if the  condition fails to improve as anticipated.    Collaboration of Care: Other provider involved in patient's care AEB notes are available in epic to review.  Patient/Guardian was advised Release of Information must be obtained prior to any record release in order to collaborate their care with an outside provider. Patient/Guardian was advised if they have not already done so to contact the registration department to sign all necessary forms in order for [place] to release information regarding their care.   Consent: Patient/Guardian gives verbal consent for treatment and assignment of benefits for services provided during this visit. Patient/Guardian expressed understanding and agreed to proceed.     I provided 33 minutes of non face to face time during this encounter.  Note: This document was prepared by [employer] voice dictation technology and any errors that results from this process  are unintentional.    Cleotis Nipper, MD 09/08/2022

## 2022-09-10 ENCOUNTER — Other Ambulatory Visit: Payer: Self-pay

## 2022-09-10 ENCOUNTER — Encounter: Payer: 59 | Admitting: Physical Therapy

## 2022-09-16 ENCOUNTER — Other Ambulatory Visit: Payer: Self-pay

## 2022-09-16 ENCOUNTER — Encounter: Payer: 59 | Admitting: Physical Therapy

## 2022-09-18 ENCOUNTER — Encounter: Payer: 59 | Admitting: Physical Therapy

## 2022-09-19 ENCOUNTER — Telehealth (HOSPITAL_COMMUNITY): Payer: Self-pay | Admitting: *Deleted

## 2022-09-19 NOTE — Telephone Encounter (Signed)
She can take 7.5 mg Abilify for 1 week and then 10 mg.  If she has no side effects and agreed with the plan then please call the pharmacy.

## 2022-09-19 NOTE — Telephone Encounter (Signed)
Writer spoke with pt who called stating that she had a "breakdown" this morning taking her son to school. It has been a struggle she says since school started and son has been acting out. Pt is extremely stressed about son's behaviors (to the point of looking for placement), trying to work full time, and having no support. Pt did take Klonopin and says it helped but she thought she was going to be sent home from work as she was so upset. Pt is wondering if she may go up on Abilify dose. She is currently taking 5 mg every day as she was just titrated up from 2.5 mg every day for one week. Pt is planning to go back to therapy as soon as possible. Pt displayed insight regarding therapy and coping skills and says she is under tremendous stress currently. Pt f/u is scheduled for 12/08/22. Please review and advise.

## 2022-09-23 ENCOUNTER — Encounter: Payer: 59 | Admitting: Physical Therapy

## 2022-09-24 NOTE — Telephone Encounter (Signed)
Pt verbalizes understanding and says she will call the office when she is out of the Abilify 5 mg tablets.

## 2022-09-25 ENCOUNTER — Encounter: Payer: 59 | Admitting: Physical Therapy

## 2022-09-29 ENCOUNTER — Encounter (HOSPITAL_COMMUNITY): Payer: Self-pay | Admitting: *Deleted

## 2022-10-06 ENCOUNTER — Other Ambulatory Visit: Payer: Self-pay

## 2022-10-21 ENCOUNTER — Ambulatory Visit: Payer: 59 | Admitting: Clinical

## 2022-10-21 DIAGNOSIS — F419 Anxiety disorder, unspecified: Secondary | ICD-10-CM | POA: Diagnosis not present

## 2022-10-21 DIAGNOSIS — F439 Reaction to severe stress, unspecified: Secondary | ICD-10-CM | POA: Diagnosis not present

## 2022-10-21 DIAGNOSIS — F331 Major depressive disorder, recurrent, moderate: Secondary | ICD-10-CM | POA: Diagnosis not present
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
## 2022-10-21 NOTE — Progress Notes (Signed)
                Dezi Schaner, LCSW 

## 2022-11-01 IMAGING — MR MR SHOULDER*R* W/O CM
4 of 5 series · 31 of 40 positions shown · non-contrast
Comparison: None.

CLINICAL DATA: Right shoulder pain following injury approximately 6
weeks ago. No previous relevant surgery.

EXAM:
MRI OF THE RIGHT SHOULDER WITHOUT CONTRAST
TECHNIQUE: Multiplanar, multisequence MR imaging of the shoulder was performed.
No intravenous contrast was administered.

[Series 5: T2 fat-sat · axial · right · 4.0mm · 0.44mm/px · z∈[-80,+33]mm · 8 of 26 slices shown (1 of 3)]
[im 1/26]
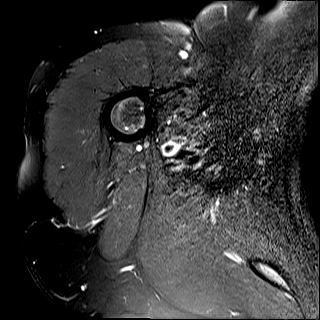
[im 4/26]
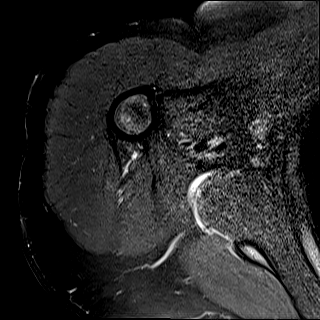
[im 8/26]
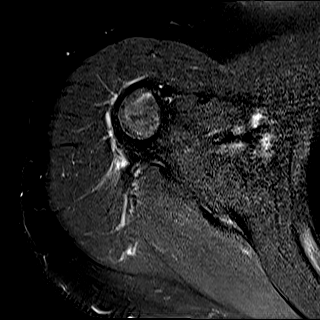
[im 11/26]
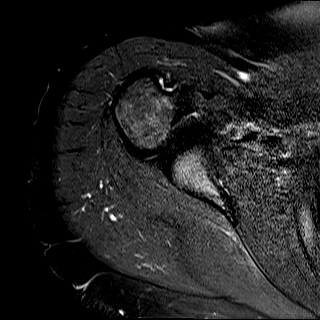
[im 15/26]
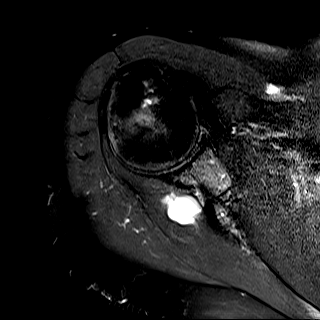
[im 18/26]
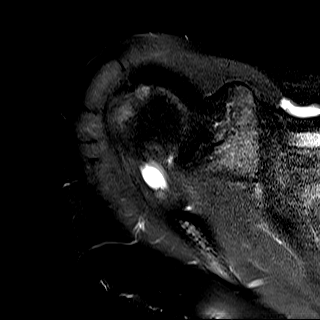
[im 22/26]
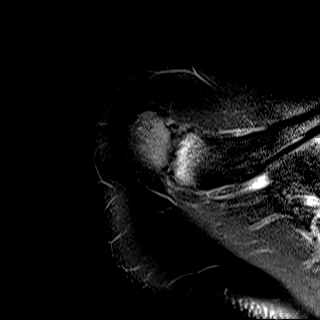
[im 26/26]
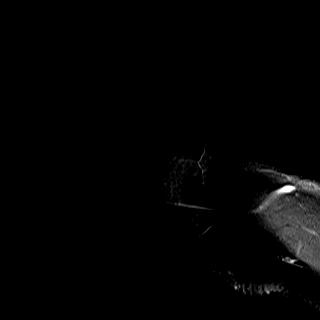

[Series 6: PD · oblique · right · 4.0mm · 0.44mm/px · 9 of 26 slices shown]
[im 1/26]
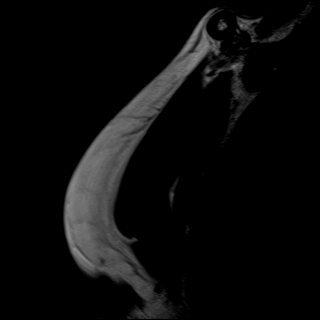
[im 4/26]
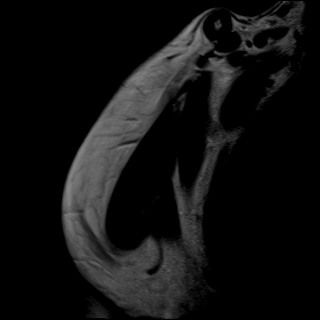
[im 7/26]
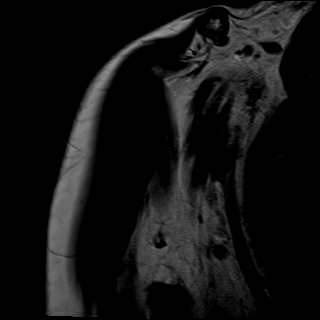
[im 10/26]
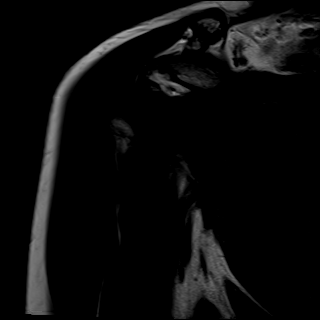
[im 13/26]
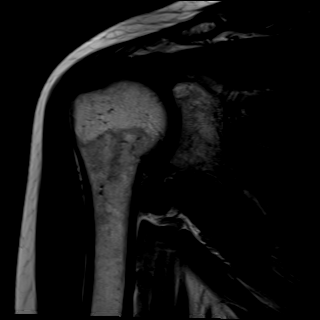
[im 16/26]
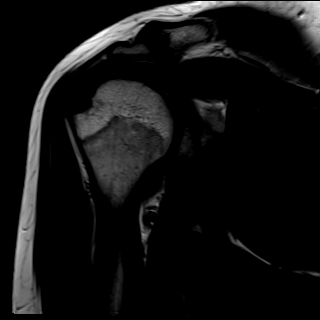
[im 19/26]
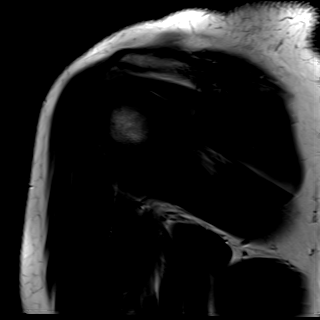
[im 22/26]
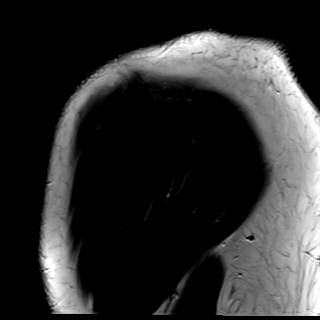
[im 26/26]
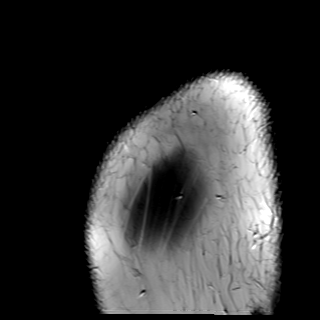

[Series 7: T2 fat-sat · oblique · right · 4.0mm · 0.44mm/px · 9 of 26 slices shown (2 of 3)]
[im 1/26]
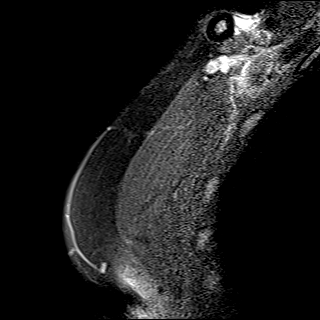
[im 4/26]
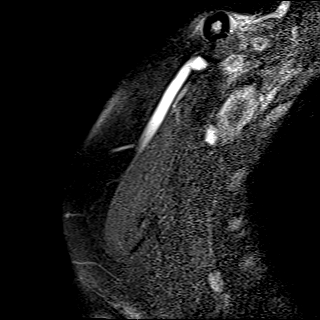
[im 7/26]
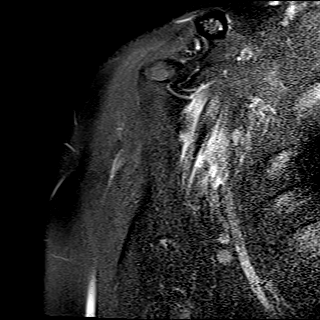
[im 10/26]
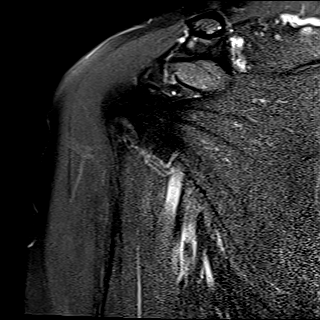
[im 13/26]
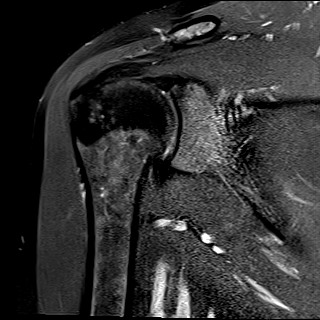
[im 16/26]
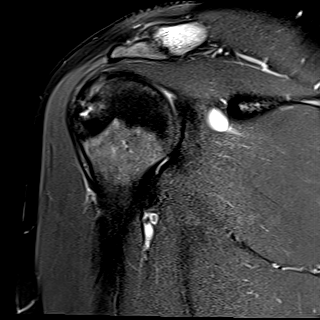
[im 19/26]
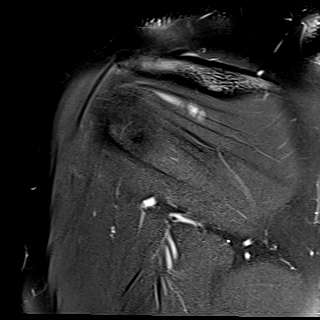
[im 22/26]
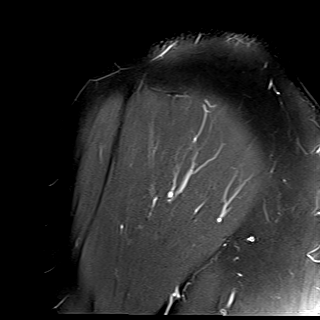
[im 26/26]
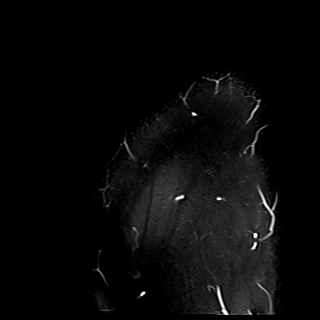

[Series 8: T2 fat-sat · coronal · right · 4.0mm · 0.22mm/px · 5 of 22 slices shown (3 of 3)]
[im 1/22]
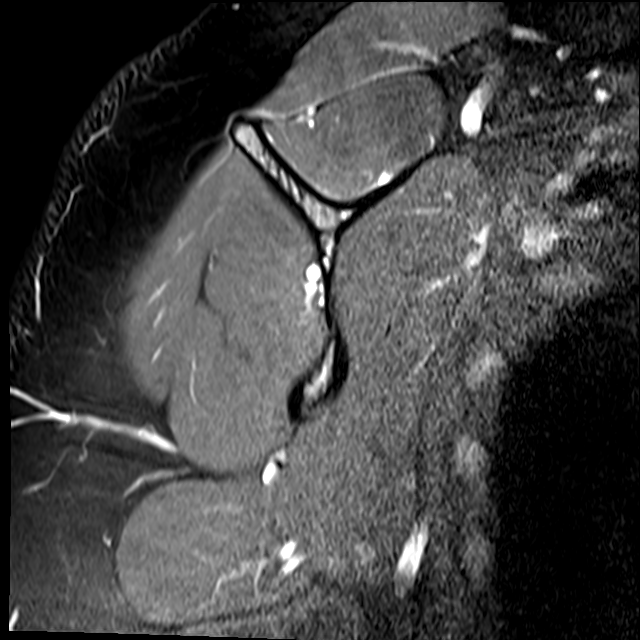
[im 4/22]
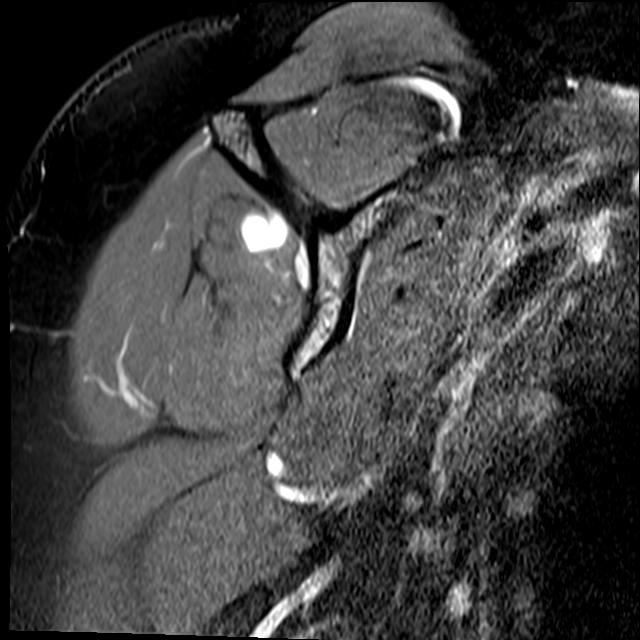
[im 8/22]
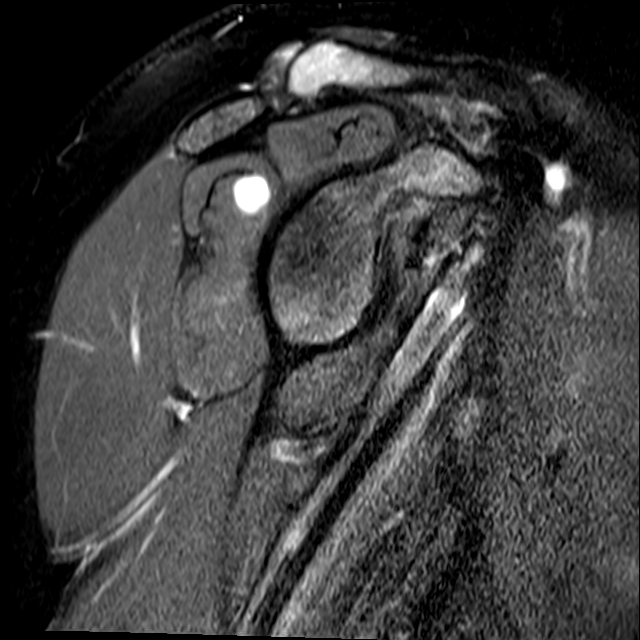
[im 11/22]
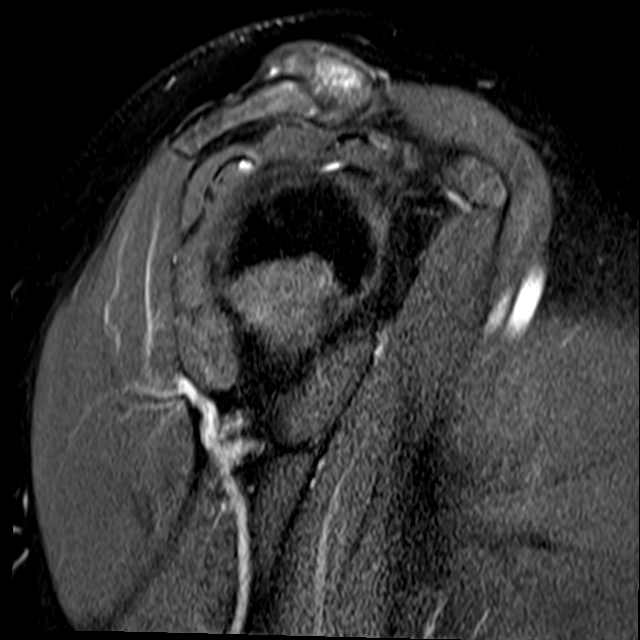
[im 18/22]
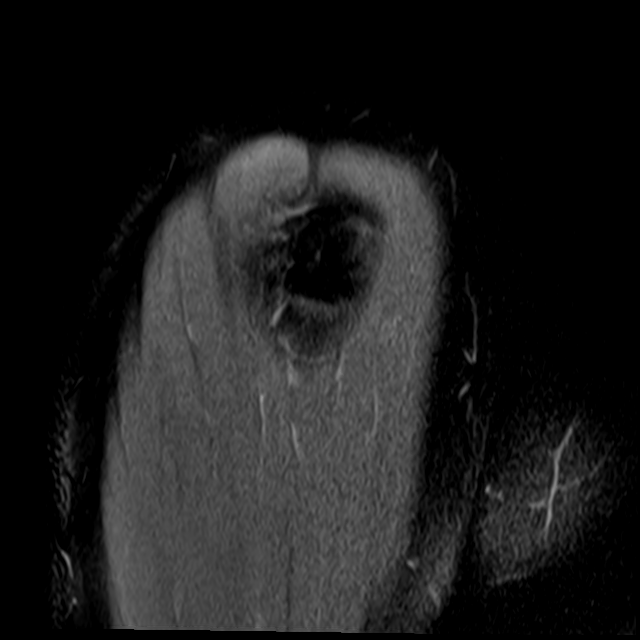

[31 of 40 positions shown; findings below may reference images not displayed]

FINDINGS: Rotator cuff: Infraspinatus tendinosis without evidence of
full-thickness rotator cuff tear. The supraspinatus, subscapularis
and teres minor tendons appear normal.

Muscles: There is a large tubular cyst within the infraspinatus
muscle, extending medially from the musculotendinous junction. This
measures up to 3.5 cm in length and 1.0 cm transverse (image [DATE]).
This demonstrates high T2 and low T1 signal and is likely a sentinel
cyst from a partial tear of the infraspinatus tendon. No focal
muscular atrophy or edema.

Biceps long head:  Intact and normally positioned.

Acromioclavicular Joint: The acromion is type 2. There are prominent
degenerative changes for age with marrow edema in the distal
clavicle. The acromioclavicular joint appears intact. No significant
fluid in the subacromial-subdeltoid bursa.

Glenohumeral Joint: No significant shoulder joint effusion or
glenohumeral arthropathy.

Labrum: Labral assessment limited by the lack of joint fluid. No
evidence of labral tear. The cyst described above appears separate
from the posterior labrum and is not felt to reflect a paralabral
cyst.

Bones: No acute or significant extra-articular osseous
findings.Subcortical cyst formation in the humeral head near the
infraspinatus and supraspinatus tendon insertions.

Other: No significant soft tissue findings.
IMPRESSION: 1. Partial tear of the infraspinatus tendon with associated
prominent sentinel cyst formation tracking medially within the
infraspinatus muscle. No full-thickness rotator cuff tear or
muscular atrophy.
2. Prominent acromioclavicular degenerative changes for age without
evidence of acute fracture or dislocation.
3. The labrum and biceps tendon appear intact.

## 2022-11-02 ENCOUNTER — Telehealth: Payer: 59 | Admitting: Physician Assistant

## 2022-11-02 DIAGNOSIS — B001 Herpesviral vesicular dermatitis: Secondary | ICD-10-CM | POA: Diagnosis not present

## 2022-11-02 MED ORDER — ACYCLOVIR 800 MG PO TABS
800.0000 mg | ORAL_TABLET | Freq: Two times a day (BID) | ORAL | 0 refills | Status: AC
Start: 2022-11-02 — End: 2022-11-10
  Filled 2022-11-03: qty 14, 7d supply, fill #0

## 2022-11-02 NOTE — Patient Instructions (Signed)
Crystal Diaz, thank you for joining Tylene Fantasia Ward, PA-C for today's virtual visit.  While this provider is not your primary care provider (PCP), if your PCP is located in our provider database this encounter information will be shared with them immediately following your visit.   A [facility] MyChart account gives you access to today's visit and all your visits, tests, and labs performed at [facility] " click here if you don't have a [place] MyChart account or go to [email or web address]  Consent: (Patient) Crystal Diaz provided verbal consent for this virtual visit at the beginning of the encounter.  Current Medications:  Current Outpatient Medications:    acyclovir (ZOVIRAX) 800 MG tablet, Take 1 tablet (800 mg total) by mouth 2 (two) times daily for 7 days., Disp: 14 tablet, Rfl: 0   lidocaine (XYLOCAINE) 5 % ointment, Apply 1 Application topically as needed., Disp: 35.44 g, Rfl: 0   ARIPiprazole (ABILIFY) 5 MG tablet, Take 0.5 tablets (2.5 mg total) by mouth daily for 7 days, THEN 1 tablet (5 mg total) daily, Disp: 30 tablet, Rfl: 2   clonazePAM (KLONOPIN) 0.5 MG tablet, Take 1 tablet (0.5 mg total) by mouth 3 (three) times daily as needed., Disp: 90 tablet, Rfl: 2   cyanocobalamin (VITAMIN B12) 1000 MCG/ML injection, Inject 1 mL (1,000 mcg total) into the muscle every 30 (thirty) days., Disp: 3 mL, Rfl: 0   desogestrel-ethinyl estradiol (MIRCETTE) 0.15-0.02/0.01 MG (21/5) tablet, Take 1 tablet by mouth daily., Disp: 84 tablet, Rfl: 3   hydrOXYzine (ATARAX) 25 MG tablet, Take 1 tablet (25 mg total) by mouth 3 (three) times daily as needed for anxiety (insomnia). (Patient not taking: Reported on 07/16/2022), Disp: 90 tablet, Rfl: 0   ketoconazole (NIZORAL) 2 % cream, Apply 1 Application topically 2 (two) times daily., Disp: 60 g, Rfl: 0   pantoprazole (PROTONIX) 40 MG tablet, Take 1 tablet (40 mg total) by mouth once daily, Disp: 90 tablet, Rfl: 3   pregabalin  (LYRICA) 75 MG capsule, Take 1 capsule (75 mg total) by mouth 2 (two) times daily., Disp: 60 capsule, Rfl: 11   Prenatal Vit-Fe Fumarate-FA (PRENATAL VITAMIN) 27-0.8 MG TABS, Take 1 tablet by mouth daily., Disp: 30 tablet, Rfl: 11   SYRINGE-NEEDLE, DISP, 3 ML (B-D 3CC LUER-LOK SYR 23GX1") 23G X 1" 3 ML MISC, Use once monthly with vitamin B12 as directed, Disp: 6 each, Rfl: 0   Vitamin D, Ergocalciferol, (DRISDOL) 1.25 MG (50000 UNIT) CAPS capsule, Take 1 capsule (50,000 Units total) by mouth once a week for 12 weeks, Disp: 12 capsule, Rfl: 0   Medications ordered in this encounter:  Meds ordered this encounter  Medications   acyclovir (ZOVIRAX) 800 MG tablet    Sig: Take 1 tablet (800 mg total) by mouth 2 (two) times daily for 7 days.    Dispense:  14 tablet    Refill:  0    Order Specific Question:   Supervising Provider    Answer:   Merrilee Jansky [id number]     *If you need refills on other medications prior to your next appointment, please contact your pharmacy*  Follow-Up: Call back or seek an in-person evaluation if the symptoms worsen or if the condition fails to improve as anticipated.  [facility] [phone]  Other Instructions Take acyclovir as prescribed.     If you have been instructed to have an in-person evaluation today at a local Urgent Care facility, please use the link  below. It will take you to a list of all of our available [facility], including address, phone number and hours of operation. Please do not delay care.  [facility]  If you or a family member do not have a primary care provider, use the link below to schedule a visit and establish care. When you choose a [facility] primary care physician or advanced practice provider, you gain a long-term partner in health. Find a Primary Care Provider  Learn more about [facility]'s in-office and virtual care options:  - Get Care Now

## 2022-11-02 NOTE — Progress Notes (Signed)
Virtual Visit Consent   Crystal Diaz, you are scheduled for a virtual visit with a [facility] provider today. Just as with appointments in the office, your consent must be obtained to participate. Your consent will be active for this visit and any virtual visit you may have with one of our providers in the next 365 days. If you have a MyChart account, a copy of this consent can be sent to you electronically.  As this is a virtual visit, video technology does not allow for your provider to perform a traditional examination. This may limit your provider's ability to fully assess your condition. If your provider identifies any concerns that need to be evaluated in person or the need to arrange testing (such as labs, EKG, etc.), we will make arrangements to do so. Although advances in technology are sophisticated, we cannot ensure that it will always work on either your end or our end. If the connection with a video visit is poor, the visit may have to be switched to a telephone visit. With either a video or telephone visit, we are not always able to ensure that we have a secure connection.  By engaging in this virtual visit, you consent to the provision of healthcare and authorize for your insurance to be billed (if applicable) for the services provided during this visit. Depending on your insurance coverage, you may receive a charge related to this service.  I need to obtain your verbal consent now. Are you willing to proceed with your visit today? Crystal Diaz has provided verbal consent on 11/02/2022 for a virtual visit (video or telephone). Tylene Fantasia Ward, PA-C  Date: 11/02/2022 5:25 PM  Virtual Visit via Video Note   I, Tylene Fantasia Ward, connected with  Crystal Diaz  ([id number], Nov 12, 1990) on 11/02/22 at  5:30 PM EDT by a video-enabled telemedicine application and verified that I am speaking with the correct person using two identifiers.  Location: Patient: Virtual Visit Location  Patient: Home Provider: Virtual Visit Location Provider: Home Office   I discussed the limitations of evaluation and management by telemedicine and the availability of in person appointments. The patient expressed understanding and agreed to proceed.    History of Present Illness: Crystal Diaz is a 32 y.o. who identifies as a female who was assigned female at birth, and is being seen today for cold sore to her left lower lip that started three days ago and has gradually become more painful.  She has tried blistex topical ointment with minimal relief. No other lesions.   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    Allergies: Not on File Medications:  Current Outpatient Medications:    acyclovir (ZOVIRAX) 800 MG tablet, Take 1 tablet (800 mg total) by mouth 2 (two) times daily for 7 days., Disp: 14 tablet, Rfl: 0   lidocaine (XYLOCAINE) 5 % ointment, Apply 1 Application topically as needed., Disp: 35.44 g, Rfl: 0   ARIPiprazole (  ABILIFY) 5 MG tablet, Take 0.5 tablets (2.5 mg total) by mouth daily for 7 days, THEN 1 tablet (5 mg total) daily, Disp: 30 tablet, Rfl: 2   clonazePAM (KLONOPIN) 0.5 MG tablet, Take 1 tablet (0.5 mg total) by mouth 3 (three) times daily as needed., Disp: 90 tablet, Rfl: 2   cyanocobalamin (VITAMIN B12) 1000 MCG/ML injection, Inject 1 mL (1,000 mcg total) into the muscle every 30 (thirty) days.,  Disp: 3 mL, Rfl: 0   desogestrel-ethinyl estradiol (MIRCETTE) 0.15-0.02/0.01 MG (21/5) tablet, Take 1 tablet by mouth daily., Disp: 84 tablet, Rfl: 3   hydrOXYzine (ATARAX) 25 MG tablet, Take 1 tablet (25 mg total) by mouth 3 (three) times daily as needed for anxiety (insomnia). (Patient not taking: Reported on 07/16/2022), Disp: 90 tablet, Rfl: 0   ketoconazole (NIZORAL) 2 % cream, Apply 1 Application topically 2 (two) times daily., Disp: 60 g, Rfl: 0   pantoprazole (PROTONIX) 40 MG tablet, Take 1 tablet (40 mg total) by mouth once daily, Disp: 90 tablet, Rfl: 3   pregabalin (LYRICA) 75 MG capsule, Take 1 capsule (75 mg total) by mouth 2 (two) times daily., Disp: 60 capsule, Rfl: 11   Prenatal Vit-Fe Fumarate-FA (PRENATAL VITAMIN) 27-0.8 MG TABS, Take 1 tablet by mouth daily., Disp: 30 tablet, Rfl: 11   SYRINGE-NEEDLE, DISP, 3 ML (B-D 3CC LUER-LOK SYR 23GX1") 23G X 1" 3 ML MISC, Use once monthly with vitamin B12 as directed, Disp: 6 each, Rfl: 0   Vitamin D, Ergocalciferol, (DRISDOL) 1.25 MG (50000 UNIT) CAPS capsule, Take 1 capsule (50,000 Units total) by mouth once a week for 12 weeks, Disp: 12 capsule, Rfl: 0  Observations/Objective: Patient is well-developed, well-nourished in no acute distress.  Resting comfortably at home.  Head is normocephalic, atraumatic.  No labored breathing.  Speech is clear and coherent with logical content.  Patient is alert and oriented at baseline.    Assessment and Plan: 1. Cold sore - acyclovir (ZOVIRAX) 800 MG tablet; Take 1 tablet (800 mg total) by mouth 2 (two) times daily for 7 days.  Dispense: 14 tablet; Refill: 0    Follow Up Instructions: I discussed the assessment and treatment plan with the patient. The patient was provided an opportunity to ask questions and all were answered. The patient agreed with the plan and demonstrated an understanding of the instructions.  A copy of instructions were sent to the patient via MyChart unless otherwise noted  below.     The patient was advised to call back or seek an in-person evaluation if the symptoms worsen or if the condition fails to improve as anticipated.    Tylene Fantasia Ward, PA-C

## 2022-11-03 ENCOUNTER — Other Ambulatory Visit: Payer: Self-pay

## 2022-11-03 MED ORDER — PREGABALIN 150 MG PO CAPS
150.0000 mg | ORAL_CAPSULE | Freq: Two times a day (BID) | ORAL | 2 refills | Status: DC
  Filled 2022-11-03: qty 60, 30d supply, fill #0
  Filled 2023-02-06: qty 60, 30d supply, fill #1
  Filled 2023-04-20: qty 60, 30d supply, fill #2

## 2022-11-10 ENCOUNTER — Telehealth (HOSPITAL_BASED_OUTPATIENT_CLINIC_OR_DEPARTMENT_OTHER): Payer: 59 | Admitting: Psychiatry

## 2022-11-10 ENCOUNTER — Other Ambulatory Visit: Payer: Self-pay

## 2022-11-10 ENCOUNTER — Encounter (HOSPITAL_COMMUNITY): Payer: Self-pay | Admitting: Psychiatry

## 2022-11-10 ENCOUNTER — Ambulatory Visit: Payer: 59 | Admitting: Professional

## 2022-11-10 VITALS — Wt 257.0 lb

## 2022-11-10 DIAGNOSIS — F331 Major depressive disorder, recurrent, moderate: Secondary | ICD-10-CM | POA: Diagnosis not present

## 2022-11-10 DIAGNOSIS — F419 Anxiety disorder, unspecified: Secondary | ICD-10-CM

## 2022-11-10 DIAGNOSIS — F439 Reaction to severe stress, unspecified: Secondary | ICD-10-CM | POA: Diagnosis not present

## 2022-11-10 MED ORDER — ARIPIPRAZOLE 10 MG PO TABS
101.0000 mg | ORAL_TABLET | Freq: Every day | ORAL | 2 refills | Status: DC
  Filled 2022-11-10: qty 30, 2d supply, fill #0

## 2022-11-10 MED ORDER — CLONAZEPAM 0.5 MG PO TABS
0.5000 mg | ORAL_TABLET | Freq: Three times a day (TID) | ORAL | 2 refills | Status: DC | PRN
Start: 2022-11-10 — End: 2023-02-04
  Filled 2022-11-10: qty 90, 30d supply, fill #0

## 2022-11-10 MED ORDER — ARIPIPRAZOLE 10 MG PO TABS
10.0000 mg | ORAL_TABLET | Freq: Every day | ORAL | 2 refills | Status: DC
Start: 2022-11-10 — End: 2023-02-04
  Filled 2022-11-10: qty 30, 30d supply, fill #0
  Filled 2022-12-08: qty 30, 30d supply, fill #1

## 2022-11-10 NOTE — Addendum Note (Signed)
Addended by: Kathryne Sharper T on: 11/10/2022 09:36 AM   Modules accepted: Orders

## 2022-11-10 NOTE — Progress Notes (Signed)
[facility] MD Virtual Progress Note   Patient Location: Work Provider Location: Home  I connect with patient by video and verified that I am speaking with correct person by using two identifiers. I discussed the limitations of evaluation and management by telemedicine and the availability of in person appointments. I also discussed with the patient that there may be a patient responsible charge related to this service. The patient expressed understanding and agreed to proceed.  Crystal Diaz [id number] 32 y.o.  11/10/2022 9:08 AM  History of Present Illness:  Patient is evaluated by video session.  She requested an earlier appointment.  She is out of the Abilify refills.  She had called earlier after feeling worsening of symptoms.  She was told to start Abilify 10 mg.  She was initially taking only 5 mg.  She noticed improvement in her mood.  Her job is going well.  Occasionally she gets nervous, anxious and irritable but symptoms are manageable.  She is tolerating 10 mg Abilify which she take in the morning.  She is taking Klonopin every day before she goes to work and sometimes she take during the day when she feels very nervous or anxious.  She takes 2 tablet in the morning before she go to work.  She is pleased that son is doing very well.  Patient told on his last admission in [place] they prescribed Abilify and that has been working well.  Patient told son is on Abilify injection because sometimes he does not take the medication.  She also planning to have a visiting vacation with son during holidays.  She reported Abilify helping in keeping him calm.  She denies any hallucination, paranoia or any suicidal thoughts.  She has no tremors.  She is more active and started work out.  She may have lost 1 or 2 pounds.  Patient told she has back pain and she is going to start aquatic therapy reports she consider back surgery.  Past Psychiatric History: Denies any history of  suicidal attempt, inpatient treatment, psychosis, hallucination, PTSD. Tried Zoloft, Effexor and Lexapro from PCP but they were ineffective. Lamictal helped but caused rash.    Outpatient Encounter Medications as of 11/10/2022  Medication Sig   lidocaine (XYLOCAINE) 5 % ointment Apply 1 Application topically as needed.   acyclovir (ZOVIRAX) 800 MG tablet Take 1 tablet (800 mg total) by mouth 2 (two) times daily for 7 days.   ARIPiprazole (ABILIFY) 5 MG tablet Take 0.5 tablets (2.5 mg total) by mouth daily for 7 days, THEN 1 tablet (5 mg total) daily   clonazePAM (KLONOPIN) 0.5 MG tablet Take 1 tablet (0.5 mg total) by mouth 3 (three) times daily as needed.   cyanocobalamin (VITAMIN B12) 1000 MCG/ML injection Inject 1 mL (1,000 mcg total) into the muscle every 30 (thirty) days.   desogestrel-ethinyl estradiol (MIRCETTE) 0.15-0.02/0.01 MG (21/5) tablet Take 1 tablet by mouth daily.   hydrOXYzine (ATARAX) 25 MG tablet Take 1 tablet (25 mg total) by mouth 3 (three) times daily as needed for anxiety (insomnia). (Patient not taking: Reported on 07/16/2022)   ketoconazole (NIZORAL) 2 % cream Apply 1 Application topically 2 (two) times daily.   pantoprazole (PROTONIX) 40 MG tablet Take 1 tablet (40 mg total) by mouth once daily   pregabalin (LYRICA) 150 MG capsule Take 1 capsule (150 mg total) by mouth 2 (two) times daily.   Prenatal Vit-Fe Fumarate-FA (PRENATAL VITAMIN) 27-0.8 MG TABS Take 1 tablet by mouth daily.   SYRINGE-NEEDLE,  DISP, 3 ML (B-D 3CC LUER-LOK SYR 23GX1") 23G X 1" 3 ML MISC Use once monthly with vitamin B12 as directed   Vitamin D, Ergocalciferol, (DRISDOL) 1.25 MG (50000 UNIT) CAPS capsule Take 1 capsule (50,000 Units total) by mouth once a week for 12 weeks   No facility-administered encounter medications on file as of 11/10/2022.    No results found for this or any previous visit (from the past 2160 hour(s)).   Psychiatric Specialty Exam: Physical Exam  Review of Systems   Weight 257 lb (116.6 kg).There is no height or weight on file to calculate BMI.  General Appearance: Casual  Eye Contact:  Good  Speech:  Normal Rate  Volume:  Normal  Mood:  Euthymic  Affect:  Congruent  Thought Process:  Goal Directed  Orientation:  Full (Time, Place, and Person)  Thought Content:  Logical  Suicidal Thoughts:  No  Homicidal Thoughts:  No  Memory:  Immediate;   Good Recent;   Good Remote;   Good  Judgement:  Intact  Insight:  Present  Psychomotor Activity:  Normal  Concentration:  Concentration: Good and Attention Span: Good  Recall:  Good  Fund of Knowledge:  Good  Language:  Good  Akathisia:  No  Handed:  Right  AIMS (if indicated):     Assets:  Communication Skills Desire for Improvement Housing Talents/Skills Transportation  ADL's:  Intact  Cognition:  WNL  Sleep:  ok     Assessment/Plan: MDD (major depressive disorder), recurrent episode, moderate (HCC) - Plan: ARIPiprazole (ABILIFY) 10 MG tablet, clonazePAM (KLONOPIN) 0.5 MG tablet  Anxiety - Plan: ARIPiprazole (ABILIFY) 10 MG tablet, clonazePAM (KLONOPIN) 0.5 MG tablet  Trauma and stressor-related disorder  Patient doing better on current dose.  Abilify increased to 10 mg.  She is tolerating well and reported no side effects.  Recommend to keep the Abilify at 10 mg and continue Klonopin 1 mg in the morning and 0.5 as needed.  Patient is also going to start therapy with Sarah for trauma.  She is looking forward to therapy sessions.  Recommend to call us back if she is any question or any concern.  She has appointment coming up in end of November but we will reschedule to see her in 3 months.   Follow Up Instructions:     I discussed the assessment and treatment plan with the patient. The patient was provided an opportunity to ask questions and all were answered. The patient agreed with the plan and demonstrated an understanding of the instructions.   The patient was advised to call back or  seek an in-person evaluation if the symptoms worsen or if the condition fails to improve as anticipated.    Collaboration of Care: Other provider involved in patient's care AEB notes are available in epic to review  Patient/Guardian was advised Release of Information must be obtained prior to any record release in order to collaborate their care with an outside provider. Patient/Guardian was advised if they have not already done so to contact the registration department to sign all necessary forms in order for [place] to release information regarding their care.   Consent: Patient/Guardian gives verbal consent for treatment and assignment of benefits for services provided during this visit. Patient/Guardian expressed understanding and agreed to proceed.     I provided 22 minutes of non face to face time during this encounter.  Note: This document was prepared by [employer] voice dictation technology and any errors that results from this process  are unintentional.    Cleotis Nipper, MD 11/10/2022

## 2022-11-11 ENCOUNTER — Other Ambulatory Visit: Payer: Self-pay

## 2022-11-12 ENCOUNTER — Ambulatory Visit: Payer: 59 | Attending: Neurosurgery | Admitting: Physical Therapy

## 2022-11-12 DIAGNOSIS — M5459 Other low back pain: Secondary | ICD-10-CM

## 2022-11-12 DIAGNOSIS — M792 Neuralgia and neuritis, unspecified: Secondary | ICD-10-CM

## 2022-11-12 DIAGNOSIS — M6281 Muscle weakness (generalized): Secondary | ICD-10-CM

## 2022-11-12 NOTE — Therapy (Incomplete)
OUTPATIENT PHYSICAL THERAPY EVALUATION   Patient Name: Crystal Diaz MRN: [id number] DOB:Jun 06, 1990, 32 y.o., female Today's Date: 11/12/2022  END OF SESSION:   Past Medical History:  Diagnosis Date   Anemia    Anxiety    Encounter for birth control 03/23/2018   GERD (gastroesophageal reflux disease)    Headache    Lumbar radiculopathy    PCOS (polycystic ovarian syndrome)    Plantar fasciitis, bilateral    Past Surgical History:  Procedure Laterality Date   FOOT SURGERY Right    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   Procedure: LAPAROSCOPIC ROUX-EN-Y GASTRIC BYPASS WITH HIATAL HERNIA REPAIR AND UPPER ENDOSCOPY;  Surgeon: Luretha Murphy, MD;  Location: [facility];  Service: General;  Laterality: N/A;   WISDOM TOOTH EXTRACTION     Patient Active Problem List   Diagnosis Date Noted   Frequent headaches 09/06/2021   High risk medication use 08/02/2021   MDD (major depressive disorder) 04/26/2021   Vitamin D deficiency 06/08/2020   Abnormal uterine bleeding 12/02/2019   Screening for STD (sexually transmitted disease) 10/19/2018   GERD (gastroesophageal reflux disease) 03/23/2018   Encounter for annual general medical examination with abnormal findings in adult 03/23/2018   Vitamin B12 deficiency due to intestinal malabsorption 12/16/2017   GAD (generalized anxiety disorder) 11/25/2017   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  ONSET DATE: about 2 years prior to PT eval  SUBJECTIVE:                                                                                                                                                                                           SUBJECTIVE STATEMENT: Patient states she and her neurosurgeon has determined that her back pain stemmed from an injury that started with her shoulder and is causing  the nerves from the neck and is going into her back (points to her right rhomboid region). Then she also had a bulging disc in her right buttocks and low back.   She states her incontinence is uncontrollable. She was at her storage unit yesterday and she could not hold her urine and had to pop a squat since there was no bathroom available. She had to soil her clothes. Her surgeon said the nerves down there can contribute to the incontinence.   She is trying to prevent a surgery.   It has been getting worse. She states she woke up last christmas and couldn't move. She called EMS and was given valium and her pain went  away completely for a day, then it happened again the next day. She is very afraid of this happening again. She was given phentanyl and that did not touch it.   She has had injections. She felt dry needling helped her during last PT episode of care, but it was temporary.   Pain has been radiating more on the right side.   She is worried that a massage pushed the disc further onto her nerve and contributed to her the scary episode of pain at [facility] 2023.   Right leg started having symptoms since last PT session.   Urinary urgency that has caused accident, not bowel. Always really strong urge first, denies incomplete emptying or pain or incontinence with no urge. Denies leaking with coughing or sneezing.     PERTINENT HISTORY:  Patient is a 32 y.o. female who presents to outpatient physical therapy with a referral for medical diagnosis ***. This patient's chief complaints consist of ***, leading to the following functional deficits: ***. Relevant past medical history and comorbidities include ***.  Patient denies hx of cancer, unexplained weight loss, and osteoporosis.    PAIN: Are you having pain? Yes NPRS: Current: ***/10,  Best: 4/10 after medications, Worst: 10/10. Pain location: across low back, right buttocks and down right leg more recently, originally left buttocks and  left leg.  Pain description: right side shooting/sharp (typically a dull burning), dull/burning over the low back, tingling/numbness down right leg for about 6 min (woke up with it), sometimes also has radiation down left leg.  Aggravating factors: sleeping, up and down a lot, maybe after a massage on christmas, prolonged standing, prolonged driving (to [place]), transitional movements, seated flexion, seated extension.  Relieving factors: valium, medication (includes lyrica), heater in seat, ice, sitting with legs up/crossed  FUNCTIONAL LIMITATIONS: ***  LEISURE: ***   PRECAUTIONS: {Therapy precautions:24002}  RED FLAGS: Bowel or bladder incontinence: Yes: urge incontinence that causes soiling of clothing. Neurosurgeon aware.     WEIGHT BEARING RESTRICTIONS: {Yes ***/No:24003}  FALLS:  Has patient fallen in last 6 months? {fallsyesno:27318}  LIVING ENVIRONMENT: Lives with: {[facility] lives with:25569::"lives with their family"} Lives in: {Lives in:25570} Stairs: {opstairs:27293} Has following equipment at home: {Assistive devices:23999}  OCCUPATION: ***  PLOF: {PLOF:24004}  PATIENT GOALS: ***  NEXT MD VISIT: ***  OBJECTIVE  DIAGNOSTIC FINDINGS:  Lumbar MRI report from 02/26/2022:  CLINICAL DATA:  Central and bilateral lower back and buttock pain, bilateral foot numbness   EXAM: MRI LUMBAR SPINE WITHOUT CONTRAST   TECHNIQUE: Multiplanar, multisequence MR imaging of the lumbar spine was performed. No intravenous contrast was administered.   COMPARISON:  No prior MRI of the lumbar spine, correlation is made with CT lumbar spine 03/10/2021   FINDINGS: Segmentation: 5 lumbar type vertebral bodies. The lowest formed disc space is L5-S1.   Alignment: Trace retrolisthesis of L5 on S1, unchanged. Straightening of the normal lumbar lordosis. Mild dextroscoliosis.   Vertebrae:  No fracture, evidence of discitis, or bone lesion.   Conus medullaris and cauda equina: Conus  extends to the L1-L2 level. Conus and cauda equina appear normal.   Paraspinal and other soft tissues: Negative.   Disc levels:   T12-L1: No significant disc bulge. No spinal canal stenosis or neural foraminal narrowing.   L1-L2: No significant disc bulge. Mild facet arthropathy. No spinal canal stenosis or neural foraminal narrowing.   L2-L3: No significant disc bulge. Mild facet arthropathy. No spinal canal stenosis or neural foraminal narrowing.   L3-L4: No significant disc  bulge. Mild facet arthropathy. No spinal canal stenosis or neural foraminal narrowing.   L4-L5: No significant disc bulge. Mild facet arthropathy. Narrowing of the left lateral recess. No spinal canal stenosis or neural foraminal narrowing.   L5-S1: Central disc extrusion with 6 mm caudal migration. This contacts the descending S1 nerve roots. Mild facet arthropathy. No spinal canal stenosis or neural foraminal narrowing.   IMPRESSION: 1. 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 2. Narrowing of the left lateral recess at L4-L5, which could affect the descending left L5 nerve roots. 3. Multilevel mild facet arthropathy.     Electronically Signed   By: Wiliam Ke M.D.   On: 02/26/2022 19:38  SELF- REPORTED FUNCTION FOTO score: ***/100 (*** questionnaire)  OBSERVATION/INSPECTION Posture Posture (seated): forward head, rounded shoulders, slumped in sitting.  Posture (standing): *** Posture correction: *** Anthropometrics Tremor: none Body composition: *** Muscle bulk: *** Skin: The incision sites appear to be healing well with no excessive redness, warmth, drainage or signs of infection present.  *** Edema: *** Functional Mobility Bed mobility: *** Transfers: *** Gait: grossly WFL for household and short community ambulation. More detailed gait analysis deferred to later date as needed. *** Stairs: ***  SPINE MOTION  LUMBAR  SPINE AROM *Indicates pain Flexion: fingers to floor, feels good, OP increases pain Extension: 50% increased concordant bilateral pain over PSIS Side Flexion:   R finger to patella,  increased concordant pain over right PSIS  L finger to pateallaincreased concordant pain over right PSIS Rotation:  R 25% concordant pain across low back L 25% not as much concordant pain across low back  Side glide:  R *** L *** Protraction: *** Retraction: ***   NEUROLOGICAL  Upper Motor Neuron Screen Babinski, Hoffman's and Clonus (ankle) negative bilaterally.  Dermatomes C2-T1 appears equal and intact to light touch except the following: *** L2-S2 appears equal and intact to light touch except the following: *** Deep Tendon Reflexes R/L  ***+/***+ Biceps brachii reflex (C5, C6) ***+/***+ Brachioradialis reflex (C6) ***+/***+ Triceps brachii reflex (C7) ***+/***+ Quadriceps reflex (L4) ***+/***+ Achilles reflex (S1)  SPINE MOTION  CERVICAL SPINE AROM *Indicates pain Flexion: *** Extension: *** Side Flexion:   R ***  L *** Rotation:  R *** L ***   PERIPHERAL JOINT MOTION (in degrees)  ACTIVE RANGE OF MOTION (AROM) *Indicates pain Date Date Date  Joint/Motion R/L R/L R/L  Shoulder     Flexion / / /  Extension / / /  Abduction  / / /  External rotation / / /  Internal rotation / / /  Elbow     Flexion  / / /  Extension  / / /  Wrist     Flexion / / /  Extension  / / /  Radial deviation / / /  Ulnar deviation / / /  Pronation / / /  Supination / / /  Hip     Flexion / / /  Extension  / / /  Abduction / / /  Adduction / / /  External rotation / / /  Internal rotation  / / /  Knee     Extension / / /  Flexoin / / /  Ankle/Foot     Dorsiflexion (knee ext) / / /  Dorsiflexion (knee flex) / / /  Plantarflexion / / /  Everison / / /  Inversion / / /  Great toe extension / / /  Great toe flexion / / /  Comments:   PASSIVE RANGE OF MOTION (PROM) *Indicates  pain Date Date Date  Joint/Motion R/L R/L R/L  Shoulder     Flexion / / /  Extension / / /  Abduction  / / /  External rotation / / /  Internal rotation / / /  Elbow     Flexion  / / /  Extension  / / /  Wrist     Flexion / / /  Extension  / / /  Radial deviation / / /  Ulnar deviation / / /  Pronation / / /  Supination / / /  Hip     Flexion  / / /  Extension  / / /  Abduction / / /  Adduction / / /  External rotation / / /  Internal rotation  / / /  Knee     Extension / / /  Flexion / / /  Ankle/Foot     Dorsiflexion (knee ext) / / /  Dorsiflexion (knee flex) / / /  Plantarflexion / / /  Everison / / /  Inversion / / /  Great toe extension / / /  Great toe flexion / / /  Comments:  11/12/2022:  R hip limited in hip flexion to about 90 degress due to ipsilateral glute pain,  adduction at 90 degrees flexion and IR at 90 degrees flexion limited due to ipsilateral glute pain, bilaterally.   MUSCLE PERFORMANCE (MMT):  *Indicates pain 11/12/22 Date Date  Joint/Motion R/L R/L R/L  Shoulder     Flexion 4+*/5 / /  Abduction (C5) / / /  External rotation / / /  Internal rotation / / /  Extension / / /  Elbow     Flexion (C6) / / /  Extension (C7) / / /  Wrist     Flexion (C7) / / /  Extension (C6) / / /  Radial deviation / / /  Ulnar deviation (C8) / / /  Pronation / / /  Supination / / /  Hand     Thumb extension (C8) / / /  Finger abduction (T1) / / /  Grip (C8) / / /  Hip     Flexion (L1, L2) 4+*/5 / /  Extension (knee ext) / / /  Extension (knee flex) / / /  Abduction / / /  Adduction / / /  External rotation / / /  Internal rotation  / / /  Knee     Extension (L3) 5*/5 / /  Flexion (S2) / / /  Ankle/Foot     Dorsiflexion (L4) 4+/5 / /  Great toe extension (L5) 5/5 / /  Eversion (S1) 5/5 / /  Plantarflexion (S1) / / /  Inversion / / /  Pronation / / /  Great toe flexion / / /  Comments:   SPECIAL TESTS:  LOWER LIMB NEURODYNAMIC  TESTS Straight Leg Raise (Sciatic nerve)  R  = negative  L  = negative  HIP SPECIAL TESTS Thigh thrust: R = ***, L = ***. FABER: R = ***, L = ***. FADDIR: R = negative, posterior hip pain, L = negative, mild groin pain but not concordant, posterior hip pain.   SIJ SPECIAL TESTS SIJ distraction: R = ***, L = ***. SIJ compression: R = ***, L = ***. Thigh thrust: R = ***, L = ***. Sacral thrust: R = positive  for right SIJ, L = negative. Gaenslen's: R = ***, L = ***. Supine to sit: *** (implicates *** innominate rotation). Symptomatic side appears to be longer in supine and becomes shorter in sitting= anterior innominate on that side        SHOULDER SPECIAL TESTS RTC, Impingement, Anterior Instability (macrotrauma), Labral Tear: Painful arc test: R = ***, L = ***. Drop arm test: R = ***, L = ***. Hawkins-Kennedy test: R = ***, L = ***. Infraspinatus test: R = ***, L = ***. Apprehension test: R = ***, L = ***. Relocation test: R = ***, L = ***. Active compression test: R = ***, L = ***.  ACCESSORY MOTION: ***  PALPATION: ***  SUSTAINED POSITIONS TESTING:  ***  REPEATED MOTIONS TESTING: ***  FUNCTIONAL/BALANCE TESTS: Five Time Sit to Stand (5TSTS): *** seconds Functional Gait Assessment (FGA): ***/30 (see details above) Ten meter walking trial ( ): *** m/s Six Minute Walk Test ( ): *** feet Timed Up and Go (TUG): *** seconds   Dynamic Gait Index: ***/24 BERG Balance Scale: ***/56 Tinetti/POMA: ***/28 Timed Up and GO: *** seconds (average of 3 trials) Trial 1: *** Trial 2: *** Trial 3: *** Romberg test: -Narrow stance, eyes open: *** seconds -Narrow stance, eyes closed: *** seconds Sharpened Romberg test: -Tandem stance, eyes open: *** seconds -Tandem stance, eyes closed: *** seconds  Narrow stance, firm surface, eyes open: *** seconds Narrow stance, firm surface, eyes closed: *** seconds Narrow stance, compliant surface, eyes open: ***  seconds Narrow stance, compliant surface, eyes closed: *** seconds Single leg stance, firm surface, eyes open: R= *** seconds, L= *** seconds Single leg stance, compliant surface, eyes open: R= *** seconds, L= *** seconds Gait speed: *** m/s Functional reach test: *** inches     TODAY'S TREATMENT:                                                                                                                              DATE: ***    PATIENT EDUCATION:  Education details: *** Person educated: {Person educated:25204} Education method: {Education Method:25205} Education comprehension: {Education Comprehension:25206}  HOME EXERCISE PROGRAM: ***  ASSESSMENT:  CLINICAL IMPRESSION: Patient is a *** y.o. *** who was seen today for physical therapy evaluation and treatment for ***.   OBJECTIVE IMPAIRMENTS: {opptimpairments:25111}.   ACTIVITY LIMITATIONS: {activitylimitations:27494}  PARTICIPATION LIMITATIONS: {participationrestrictions:25113}  PERSONAL FACTORS: {Personal factors:25162} are also affecting patient's functional outcome.   REHAB POTENTIAL: {rehabpotential:25112}  CLINICAL DECISION MAKING: {clinical decision making:25114}  EVALUATION COMPLEXITY: {Evaluation complexity:25115}   GOALS: Goals reviewed with patient? {yes/no:20286}  SHORT TERM GOALS: Target date: ***  *** Baseline: Goal status: INITIAL  2.  *** Baseline:  Goal status: INITIAL  3.  *** Baseline:  Goal status: INITIAL  4.  *** Baseline:  Goal status: INITIAL  5.  *** Baseline:  Goal status: INITIAL  6.  *** Baseline:  Goal status: INITIAL  LONG TERM GOALS: Target date: ***  ***  Baseline:  Goal status: INITIAL  2.  *** Baseline:  Goal status: INITIAL  3.  *** Baseline:  Goal status: INITIAL  4.  *** Baseline:  Goal status: INITIAL  5.  *** Baseline:  Goal status: INITIAL  6.  *** Baseline:  Goal status: INITIAL  PLAN:  PT FREQUENCY: {rehab  frequency:25116}  PT DURATION: {rehab duration:25117}  PLANNED INTERVENTIONS: {rehab planned interventions:25118::"97110-Therapeutic exercises","97530- Therapeutic [phone]- Neuromuscular re-education","97535- Self BZJI","96789- Manual therapy"}.  PLAN FOR NEXT SESSION: ***   Luretha Murphy. Ilsa Iha, PT, DPT 11/12/22, 4:13 PM  [facility] [address] [place], [place] [address] P: [phone] I F: [phone]

## 2022-11-13 ENCOUNTER — Encounter: Payer: Self-pay | Admitting: Physical Therapy

## 2022-11-17 NOTE — Addendum Note (Signed)
Addended by: Norton Blizzard R on: 11/17/2022 10:08 AM   Modules accepted: Orders

## 2022-11-19 ENCOUNTER — Telehealth: Payer: 59 | Admitting: Family

## 2022-11-19 ENCOUNTER — Ambulatory Visit (HOSPITAL_BASED_OUTPATIENT_CLINIC_OR_DEPARTMENT_OTHER): Payer: 59 | Admitting: Physical Therapy

## 2022-11-19 ENCOUNTER — Other Ambulatory Visit: Payer: Self-pay

## 2022-11-19 ENCOUNTER — Telehealth: Payer: Self-pay

## 2022-11-19 DIAGNOSIS — N921 Excessive and frequent menstruation with irregular cycle: Secondary | ICD-10-CM

## 2022-11-19 DIAGNOSIS — N939 Abnormal uterine and vaginal bleeding, unspecified: Secondary | ICD-10-CM

## 2022-11-19 MED ORDER — MEDROXYPROGESTERONE ACETATE 10 MG PO TABS
10.0000 mg | ORAL_TABLET | Freq: Every day | ORAL | 1 refills | Status: DC
Start: 2022-11-19 — End: 2023-04-11
  Filled 2022-11-19: qty 10, 10d supply, fill #0
  Filled 2023-01-15 (×2): qty 10, 10d supply, fill #1

## 2022-11-19 NOTE — Progress Notes (Signed)
Virtual Visit Consent   Crystal Diaz, you are scheduled for a virtual visit with a [facility] provider today. Just as with appointments in the office, your consent must be obtained to participate. Your consent will be active for this visit and any virtual visit you may have with one of our providers in the next 365 days. If you have a MyChart account, a copy of this consent can be sent to you electronically.  As this is a virtual visit, video technology does not allow for your provider to perform a traditional examination. This may limit your provider's ability to fully assess your condition. If your provider identifies any concerns that need to be evaluated in person or the need to arrange testing (such as labs, EKG, etc.), we will make arrangements to do so. Although advances in technology are sophisticated, we cannot ensure that it will always work on either your end or our end. If the connection with a video visit is poor, the visit may have to be switched to a telephone visit. With either a video or telephone visit, we are not always able to ensure that we have a secure connection.  By engaging in this virtual visit, you consent to the provision of healthcare and authorize for your insurance to be billed (if applicable) for the services provided during this visit. Depending on your insurance coverage, you may receive a charge related to this service.  I need to obtain your verbal consent now. Are you willing to proceed with your visit today? Crystal Diaz has provided verbal consent on 11/19/2022 for a virtual visit (video or telephone). Jannifer Rodney, FNP  Date: 11/19/2022 4:27 PM  Virtual Visit via Video Note   I, Jannifer Rodney, connected with  Crystal Diaz  ([id number], [birth year]) on 11/19/22 at  4:15 PM EST by a video-enabled telemedicine application and verified that I am speaking with the correct person using two identifiers.  Location: Patient: Virtual Visit Location Patient:  Home Provider: Virtual Visit Location Provider: Home Office   I discussed the limitations of evaluation and management by telemedicine and the availability of in person appointments. The patient expressed understanding and agreed to proceed.    History of Present Illness: Crystal Diaz is a 32 y.o. who identifies as a female who was assigned female at birth, and is being seen today for vaginal bleeding that started two months ago. She is followed by GYN. She reports she ran out of her oral birth control two months ago and started having bleeding. She restarted her OC about 5 weeks ago, but the bleeding has continued. She reports using 9 tampons a day.   Reports had similar symptoms two years ago and placed on OC that stopped the bleeding. Reports mild cramping that can be 6 out 10.   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    Allergies: Not on File Medications:  Current Outpatient Medications:    lidocaine (XYLOCAINE) 5 %  ointment, Apply 1 Application topically as needed., Disp: 35.44 g, Rfl: 0   medroxyPROGESTERone (PROVERA) 10 MG tablet, Take 1 tablet (10 mg total) by mouth daily., Disp: 10 tablet, Rfl: 1   ARIPiprazole (ABILIFY) 10 MG tablet, Take 1 tablet (10 mg total) by mouth daily., Disp: 30 tablet, Rfl: 2   clonazePAM (KLONOPIN) 0.5 MG tablet, Take 1 tablet (0.5 mg total) by mouth 3 (three) times daily as  needed., Disp: 90 tablet, Rfl: 2   cyanocobalamin (VITAMIN B12) 1000 MCG/ML injection, Inject 1 mL (1,000 mcg total) into the muscle every 30 (thirty) days., Disp: 3 mL, Rfl: 0   desogestrel-ethinyl estradiol (MIRCETTE) 0.15-0.02/0.01 MG (21/5) tablet, Take 1 tablet by mouth daily., Disp: 84 tablet, Rfl: 3   ketoconazole (NIZORAL) 2 % cream, Apply 1 Application topically 2 (two) times daily., Disp: 60 g, Rfl: 0   pantoprazole (PROTONIX) 40 MG tablet, Take 1 tablet (40 mg total) by mouth once daily, Disp: 90 tablet, Rfl: 3   pregabalin (LYRICA) 150 MG capsule, Take 1 capsule (150 mg total) by mouth 2 (two) times daily., Disp: 60 capsule, Rfl: 2   Prenatal Vit-Fe Fumarate-FA (PRENATAL VITAMIN) 27-0.8 MG TABS, Take 1 tablet by mouth daily., Disp: 30 tablet, Rfl: 11   SYRINGE-NEEDLE, DISP, 3 ML (B-D 3CC LUER-LOK SYR 23GX1") 23G X 1" 3 ML MISC, Use once monthly with vitamin B12 as directed, Disp: 6 each, Rfl: 0   Vitamin D, Ergocalciferol, (DRISDOL) 1.25 MG (50000 UNIT) CAPS capsule, Take 1 capsule (50,000 Units total) by mouth once a week for 12 weeks, Disp: 12 capsule, Rfl: 0  Observations/Objective: Patient is well-developed, well-nourished in no acute distress.  Resting comfortably Head is normocephalic, atraumatic.  No labored breathing.  Speech is clear and coherent with logical content.  Patient is alert and oriented at baseline.    Assessment and Plan: 1. Menorrhagia with irregular cycle - medroxyPROGESTERone (PROVERA) 10 MG tablet; Take 1 tablet (10 mg total) by mouth daily.  Dispense: 10 tablet; Refill: 1  2. Abnormal uterine bleeding - medroxyPROGESTERone (PROVERA) 10 MG tablet; Take 1 tablet (10 mg total) by mouth daily.  Dispense: 10 tablet; Refill: 1  Start Provera 10 mg for 5-10 days  Continue OC Keep follow up with GYN  May need repeat US  Report any heavy bleeding or pain  Follow Up Instructions: I discussed the assessment and treatment plan with the patient. The patient  was provided an opportunity to ask questions and all were answered. The patient agreed with the plan and demonstrated an understanding of the instructions.  A copy of instructions were sent to the patient via MyChart unless otherwise noted below.     The patient was advised to call back or seek an in-person evaluation if the symptoms worsen or if the condition fails to improve as anticipated.    Jannifer Rodney, FNP

## 2022-11-19 NOTE — Telephone Encounter (Signed)
Attempted to contact patient, LVM for her return call.

## 2022-11-19 NOTE — Telephone Encounter (Signed)
Pt calling to speak c DJE's nurse; is having bleeding again and was told if it happened again to let DJE know and he'd call in medroxyprogesterone to help stop it.  [phone]

## 2022-11-20 NOTE — Telephone Encounter (Signed)
She has been having extended cycles lasting about 7 weeks. She has heavy bleeding, weakness and fatigue. She has a hx of anemia.

## 2022-11-20 NOTE — Telephone Encounter (Signed)
Spoke with patient. She states the bleeding began 7 weeks ago but also had a break in her OCP use for 2 weeks during this time. She states a history of anemia and has not been taking her iron or vitamin B/K. Her PCP has prescribed 10 days of Provera that she is going to try and reach back out if she continues to bleed. Advised to reach out with further questions.

## 2022-11-24 ENCOUNTER — Encounter (HOSPITAL_BASED_OUTPATIENT_CLINIC_OR_DEPARTMENT_OTHER): Payer: Self-pay | Admitting: Physical Therapy

## 2022-11-24 ENCOUNTER — Ambulatory Visit (HOSPITAL_BASED_OUTPATIENT_CLINIC_OR_DEPARTMENT_OTHER): Payer: 59 | Attending: Neurological Surgery | Admitting: Physical Therapy

## 2022-11-24 DIAGNOSIS — G529 Cranial nerve disorder, unspecified: Secondary | ICD-10-CM | POA: Diagnosis not present

## 2022-11-24 DIAGNOSIS — M545 Low back pain, unspecified: Secondary | ICD-10-CM | POA: Diagnosis not present

## 2022-11-24 DIAGNOSIS — G8929 Other chronic pain: Secondary | ICD-10-CM | POA: Diagnosis not present

## 2022-11-24 DIAGNOSIS — M4727 Other spondylosis with radiculopathy, lumbosacral region: Secondary | ICD-10-CM | POA: Diagnosis not present

## 2022-11-24 DIAGNOSIS — M6281 Muscle weakness (generalized): Secondary | ICD-10-CM | POA: Insufficient documentation

## 2022-11-24 DIAGNOSIS — M792 Neuralgia and neuritis, unspecified: Secondary | ICD-10-CM

## 2022-11-24 DIAGNOSIS — M5459 Other low back pain: Secondary | ICD-10-CM

## 2022-11-24 DIAGNOSIS — M549 Dorsalgia, unspecified: Secondary | ICD-10-CM | POA: Diagnosis not present

## 2022-11-24 NOTE — Therapy (Signed)
OUTPATIENT PHYSICAL THERAPY TREATMENT   Patient Name: Crystal Diaz MRN: [id number] DOB:1990/10/05, 32 y.o., female Today's Date: 11/24/2022  END OF SESSION:  PT End of Session - 11/24/22 0846     Visit Number 2    Number of Visits 17    Date for PT Re-Evaluation 02/04/23    Authorization Type [place] [employer] reporting period from 08/04/2022    Authorization Time Period ONLY 4 MODALITIES PER DAY  VL: 25 combined PT/OT per year    Authorization - Visit Number 5    Authorization - Number of Visits 25    Progress Note Due on Visit 10    PT Start Time 0817    PT Stop Time 0900    PT Time Calculation (min) 43 min    Behavior During Therapy [facility] for tasks assessed/performed             Past Medical History:  Diagnosis Date   Anemia    Anxiety    Encounter for birth control 03/23/2018   GERD (gastroesophageal reflux disease)    Headache    Lumbar radiculopathy    PCOS (polycystic ovarian syndrome)    Plantar fasciitis, bilateral    Past Surgical History:  Procedure Laterality Date   FOOT SURGERY Right    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   Procedure: LAPAROSCOPIC ROUX-EN-Y GASTRIC BYPASS WITH HIATAL HERNIA REPAIR AND UPPER ENDOSCOPY;  Surgeon: Luretha Murphy, MD;  Location: [facility];  Service: General;  Laterality: N/A;   WISDOM TOOTH EXTRACTION     Patient Active Problem List   Diagnosis Date Noted   Frequent headaches 09/06/2021   High risk medication use 08/02/2021   MDD (major depressive disorder) 04/26/2021   Vitamin D deficiency 06/08/2020   Abnormal uterine bleeding 12/02/2019   Screening for STD (sexually transmitted disease) 10/19/2018   GERD (gastroesophageal reflux disease) 03/23/2018   Encounter for annual general medical examination with abnormal findings in adult 03/23/2018   Vitamin B12 deficiency due to intestinal malabsorption 12/16/2017   GAD (generalized anxiety disorder) 11/25/2017   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 Pt associates start of back pain with episode that started while in PT for her shoulder in Spring 2023, Most recent flair up 3 months ago (pain radiating down right buttocks).  SUBJECTIVE:  SUBJECTIVE STATEMENT Patient reports no changes since evaluation. She reports she could seek membership to the [employer] center (lives in [place]).   PERTINENT HISTORY:  Patient is a 32 y.o. female who presents to outpatient physical therapy with a referral for medical diagnosis  lumbosacral spondylosis with radiculopathy. This patient's chief complaints consist of chronic low back pain with radiation to bilateral LE leading to the following functional deficits: difficulty with usual activities including basic mobility including bed mobility and transfers, driving, playing in the yard with her [place], yard work, being away from the bathroom, and being able to live without constant fear that she will re-experience the extreme pain she had at [employer] 2023. Relevant past medical history and comorbidities include has S/P gastric bypass; GAD (generalized anxiety disorder); Vitamin B12 deficiency due to intestinal malabsorption; GERD (gastroesophageal reflux disease); Encounter for annual general medical examination with abnormal findings in adult; Screening for STD (sexually transmitted disease); Abnormal uterine bleeding; Vitamin D deficiency; MDD (major depressive  disorder); High risk medication use; and Frequent headaches on their problem list.  has a past medical history of Anemia, Anxiety, Encounter for birth control (03/23/2018), GERD (gastroesophageal reflux disease), Headache, Lumbar radiculopathy, PCOS (polycystic ovarian syndrome), and Plantar fasciitis, bilateral.  has a past surgical history that includes Foot surgery (Right); Wisdom tooth extraction; and Gastric Roux-En-Y (N/A, 03/17/2017).  Patient denies hx of cancer, unexplained weight loss, and osteoporosis. She does endorse urinary incontinence (urge).    PAIN: Are you having pain? Yes NPRS: Current: 7-8/10 Pain location: Right buttocks Pain description: Right buttocks pain can be dull and burling but sometimes is sharp and shooting. Other pain throughout back is dull and burning. Has pain down both legs at times, right > L.  Aggravating factors: Massage, standing for long periods of time, back extension, driving for long periods of time. Relieving factors: Ice helps a lot. Turning on the heater in the drivers seat  Lyrica and valium help. Sitting with her legs up and/or legs crossed also helps. the "perfect sitting position" at work, lumbar support. Takes about 20 minutes to feel better.  FUNCTIONAL LIMITATIONS: difficulty with usual activities including basic mobility including bed mobility and transfers, driving, playing in the yard with her [place], yard work, being away from the bathroom, and being able to live without constant fear that she will re-experience the extreme pain she had at [employer] 2023.   LEISURE: Swimming, hiking, spend time with dogs, yard work (all of these are limited by her pain)   PRECAUTIONS: None  RED FLAGS:  Patient denies hx of cancer, unexplained weight loss, and osteoporosis  Patient endorses history of urinary incontinence (urge incontinence) that caused her to soil her clothes. Patient reported this was the first time this happened to this  degree.  Denies urinary retention and leaking with coughing/sneezing. Always really strong urge first.  Per patient, MD aware and he said it could be caused by nerves in her back.  Patient also endorses that her balance feels "off."   WEIGHT BEARING RESTRICTIONS: No  FALLS:  Has patient fallen in last 6 months? Yes, 1 fall when moving a vending machine and ran into a column. She felt unsteady and could not stop from falling with the vending machine. Patient does endorse feeling unsteady.   OCCUPATION: Environmental health practitioner. Patient reports she is sitting most of the time and very busy so she often doesn't notice her pain as much at work.   PLOF: Independent  PATIENT GOALS: Play  with her dogs in the yard. Be functional, learn techniques she can do at home to be comfortable   OBJECTIVE:  Note: Objective measures were completed at Evaluation unless otherwise noted.   OBJECTIVE  IMAGING Lumbar MRI report from 02/26/2022 CLINICAL DATA:  Central and bilateral lower back and buttock pain, bilateral foot numbness   EXAM: MRI LUMBAR SPINE WITHOUT CONTRAST   TECHNIQUE: Multiplanar, multisequence MR imaging of the lumbar spine was performed. No intravenous contrast was administered.   COMPARISON:  No prior MRI of the lumbar spine, correlation is made with CT lumbar spine 03/10/2021   FINDINGS: Segmentation: 5 lumbar type vertebral bodies. The lowest formed disc space is L5-S1.   Alignment: Trace retrolisthesis of L5 on S1, unchanged. Straightening of the normal lumbar lordosis. Mild dextroscoliosis.   Vertebrae:  No fracture, evidence of discitis, or bone lesion.   Conus medullaris and cauda equina: Conus extends to the L1-L2 level. Conus and cauda equina appear normal.   Paraspinal and other soft tissues: Negative.   Disc levels:   T12-L1: No significant disc bulge. No spinal canal stenosis or neural foraminal narrowing.   L1-L2: No significant disc bulge. Mild facet  arthropathy. No spinal canal stenosis or neural foraminal narrowing.   L2-L3: No significant disc bulge. Mild facet arthropathy. No spinal canal stenosis or neural foraminal narrowing.   L3-L4: No significant disc bulge. Mild facet arthropathy. No spinal canal stenosis or neural foraminal narrowing.   L4-L5: No significant disc bulge. Mild facet arthropathy. Narrowing of the left lateral recess. No spinal canal stenosis or neural foraminal narrowing.   L5-S1: Central disc extrusion with 6 mm caudal migration. This contacts the descending S1 nerve roots. Mild facet arthropathy. No spinal canal stenosis or neural foraminal narrowing.   IMPRESSION: 1. 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 2. Narrowing of the left lateral recess at L4-L5, which could affect the descending left L5 nerve roots. 3. Multilevel mild facet arthropathy.    COGNITION: Overall cognitive status: Within functional limits for tasks assessed     SENSATION: Not tested  POSTURE: increased lumbar lordosis and anterior pelvic tilt; torso "slumped forward" with lower lumbar hyperextention. Large buttocks prevents her back from contacting chairs without excessive hyperlordosis.   SPINE MOTION   LUMBAR SPINE AROM *Indicates pain Flexion: fingers to floor, feels good, OP increases pain Extension: 50% increased concordant bilateral pain over PSIS Side Flexion:       R finger to patella,  increased concordant pain over right PSIS      L finger to pateallaincreased concordant pain over right PSIS Rotation:  R 25% concordant pain across low back L 25% not as much concordant pain across low back   PERIPHERAL JOINT MOTION (in degrees) PASSIVE RANGE OF MOTION (PROM) Comments:  11/12/2022:  R hip limited in hip flexion to about 90 degress due to ipsilateral glute pain,  adduction at 90 degrees flexion and IR at 90 degrees flexion limited due to ipsilateral  glute pain, bilaterally.    MUSCLE PERFORMANCE (MMT):  *Indicates pain 11/12/22 Date Date  Joint/Motion R/L R/L R/L  Hip        Flexion (L1, L2) 4+*/5 / /  Extension (knee ext) / / /  Extension (knee flex) / / /  Abduction / / /  Knee        Extension (L3) 5*/5 / /  Flexion (S2) / / /  Ankle/Foot  Dorsiflexion (L4) 4+/5 / /  Great toe extension (L5) 5/5 / /  Eversion (S1) 5/5 / /  Comments:    SPECIAL TESTS:   LOWER LIMB NEURODYNAMIC TESTS Straight Leg Raise (Sciatic nerve)           R  = negative           L  = negative   HIP SPECIAL TESTS FADDIR: R = negative, posterior hip pain, L = negative, mild groin pain but not concordant, posterior hip pain.    SIJ SPECIAL TESTS SIJ compression: R = negative, L = negative. Thigh thrust: R = positive for right SIJ, L = negative. Sacral thrust: B = unclear   ACCESSORY MOTION: Strong concordant pain with CPA lumbar spine, worst at L5  FUNCTIONAL TESTS:  Squat: Patient completed 3 squats with pain > 8/10  TODAY'S TREATMENT:     Pt seen for aquatic therapy today.  Treatment took place in water 3.5-4.75 ft in depth at the [facility] pool. Temp of water was 91.  Pt entered/exited the pool via stairs independently with bilat rail. * intro to aquatic therapy properties/ principles * unsupported, walking forward/ backwards - multiple laps, side stepping 2 laps * holding wall: relaxed squats x 10, hip abdct/ addct x 10, alt single leg clams x 10 * Straddling noodle, holding corner: cycling * return to walking backwards/forwards unsupported *TrA set with 1/2 noodle pull down to thighs x 12 * Marching with row motion holding 1/2 noodle   Pt requires the buoyancy and hydrostatic pressure of water for support, and to offload joints by unweighting joint load by at least 50 % in navel deep water and by at least 75-80% in chest to neck deep water.  Viscosity of the water is needed for resistance of strengthening. Water  current perturbations provides challenge to standing balance requiring increased core activation.     PATIENT EDUCATION:  Education details: Intro to aquatic therapy  Person educated: Patient Education method: Explanation Education comprehension: verbalized understanding and needs further education  HOME EXERCISE PROGRAM: TBD  ASSESSMENT:   CLINICAL IMPRESSION: Pt is confident in aquatic environment and able to take direction from therapist on deck.  Pt reported slight reduction of pain while exercising, 75-80% submerged in water. Encouraged pt to seek lumbar support for desk/car, to get up 1x every hour when at work, to brace abdominals for transitional movements.   Goals are ongoing.      From initial evaluation:  Patient is a 32 y.o. female referred to outpatient physical therapy with a medical diagnosis of lumbosacral spondylosis with radiculopathy who presents with signs and symptoms consistent with chronic low back pain, likely due to discogenic pain, with intermittent radiation to B LE. Patient with negative straight leg test bilaterally, but reports episodes of paresthesia below gluteal fold bilaterally at times. She does have evidence of a disc extrusion at L5-S1 affecting S1 nerve roots bilaterally on MRI from 02/26/2022, and her presentation today most closely resembles discogenic pian at that level, with CPA to L5 vertebrae most strongly reproducing concordant pain. Competing diagnoses include SIJ pain, more likely on the right. Due to widespread tenderness at the posterior pelvic region and inability to clear the back, SIJ testing was less clear. She was negative for SIJ compression and left thigh thrust for left SIJ, but did appear to have pain at R SIJ with right thigh thrust. Unable to interpret results of sacral thrust due to hypersensitivity in the region. Patient presents  with significant pain, posture, ROM, muscle performance (strength/power/endurance), motor control, fear  avoidance, education, and activity tolerance impairments that are limiting ability to complete usual activities including basic mobility including bed mobility and transfers, driving, playing in the yard with her [place], yard work, being away from the bathroom, and being able to live without constant fear that she will re-experience the extreme pain she had at [place] 2023 without difficulty. Patient would likely benefit from graded initiating PT with lumbopelvic stabilization/motor control exercises and education. She would also likely benefit from trial of aquatic therapy due to the irritability of patient's condition, high level of anxiety about re-injury, and strong interest in participating in this mode of treatment. Patient will benefit from skilled physical therapy intervention to address current body structure impairments and activity limitations to improve function and work towards goals set in current POC in order to return to prior level of function or maximal functional improvement.     OBJECTIVE IMPAIRMENTS: decreased activity tolerance, decreased balance, decreased coordination, decreased endurance, decreased knowledge of condition, decreased mobility, difficulty walking, decreased ROM, decreased strength, impaired perceived functional ability, increased muscle spasms, improper body mechanics, postural dysfunction, obesity, and pain.    ACTIVITY LIMITATIONS: sitting, standing, squatting, sleeping, transfers, bed mobility, continence, toileting, dressing, and locomotion level   PARTICIPATION LIMITATIONS: cleaning, laundry, interpersonal relationship, driving, shopping, community activity, yard work, and  difficulty with usual activities including basic mobility including bed mobility and transfers, driving, playing in the yard with her [employer], yard work, being away from the bathroom, and being able to live without constant fear that she will re-experience the extreme pain  she had at [employer] 2023   PERSONAL FACTORS: Behavior pattern, Education, Fitness, Past/current experiences, Time since onset of injury/illness/exacerbation, and 3+ comorbidities:   S/P gastric bypass; GAD (generalized anxiety disorder); Vitamin B12 deficiency due to intestinal malabsorption; GERD (gastroesophageal reflux disease); Encounter for annual general medical examination with abnormal findings in adult; Screening for STD (sexually transmitted disease); Abnormal uterine bleeding; Vitamin D deficiency; MDD (major depressive disorder); High risk medication use; and Frequent headaches on their problem list.  has a past medical history of Anemia, Anxiety, Encounter for birth control (03/23/2018), GERD (gastroesophageal reflux disease), Headache, Lumbar radiculopathy, PCOS (polycystic ovarian syndrome), and Plantar fasciitis, bilateral.  has a past surgical history that includes Foot surgery (Right); Wisdom tooth extraction; and Gastric Roux-En-Y (N/A, 03/17/2017)are also affecting patient's functional outcome.    REHAB POTENTIAL: Good   CLINICAL DECISION MAKING: Evolving/moderate complexity   EVALUATION COMPLEXITY: Moderate   GOALS: Goals reviewed with patient? No  SHORT TERM GOALS: Target date: 11/26/2022  Patient will be independent with initial home exercise program for self-management of symptoms. Baseline: Initial HEP to be provided at later visit as appropriate (11/12/22); Goal status: INITIAL   LONG TERM GOALS: Target date: 02/04/2023  Patient will be independent with a long-term home exercise program for self-management of symptoms.  Baseline: Initial HEP to be provided at later visit as appropriate (11/12/22); Goal status: INITIAL  2.  Patient will demonstrate improved FOTO by equal or greater than 10 points by visit #10 to demonstrate improvement in overall condition and self-reported functional ability.  Baseline: to be measured by visit #2 (11/12/22); Goal status:  INITIAL  3.  Patient will demonstrate ability to complete 10 consecutive squats to 18 inch chair height with pain equal or less than 3/10 to improve her ability to get in and out of a vehicle, go hiking, and do yard work with  less difficulty.   Baseline: able to complete 3 squats with 8/10 pain (11/12/22); Goal status: INITIAL  4.  Patient will demonstrate the ability to ambulate equal or greater than 1620 feet during the 6 Minute Walk Test to equal age matched norms for healthy adults of her age and improve her ability to go hiking, do yard work, and enjoy going to her son's sporting events.  Baseline: To be tested at later date (11/12/22); Goal status: INITIAL  6.  Patient will complete community, work and/or recreational activities with 50% less limitation due to current condition.  Baseline: difficulty with usual activities including basic mobility including bed mobility and transfers, driving, playing in the yard with her [place], yard work, being away from the bathroom, and being able to live without constant fear that she will re-experience the extreme pain she had at [facility] 2023 (11/13/22); Goal status: INITIAL   PLAN:   PT FREQUENCY: 1-2x/week   PT DURATION: 12 weeks   PLANNED INTERVENTIONS: 97164- PT Re-evaluation, 97110-Therapeutic exercises, 97530- Therapeutic activity, [id number]- Neuromuscular re-education, 97535- Self Care, 44010- Manual therapy, [id number]- Aquatic Therapy, 97014- Electrical stimulation (unattended), [phone]- Electrical stimulation (manual), Patient/Family education, Balance training, Dry Needling, Joint mobilization, Spinal mobilization, Cryotherapy, and Moist heat.   PLAN FOR NEXT SESSION: aquatic physical therapy

## 2022-11-25 ENCOUNTER — Ambulatory Visit (HOSPITAL_BASED_OUTPATIENT_CLINIC_OR_DEPARTMENT_OTHER): Payer: 59 | Admitting: Physical Therapy

## 2022-11-27 ENCOUNTER — Encounter (HOSPITAL_BASED_OUTPATIENT_CLINIC_OR_DEPARTMENT_OTHER): Payer: Self-pay | Admitting: Physical Therapy

## 2022-11-27 ENCOUNTER — Ambulatory Visit (HOSPITAL_BASED_OUTPATIENT_CLINIC_OR_DEPARTMENT_OTHER): Payer: 59 | Admitting: Physical Therapy

## 2022-11-27 DIAGNOSIS — M6281 Muscle weakness (generalized): Secondary | ICD-10-CM | POA: Diagnosis not present

## 2022-11-27 DIAGNOSIS — M545 Low back pain, unspecified: Secondary | ICD-10-CM | POA: Diagnosis not present

## 2022-11-27 DIAGNOSIS — G529 Cranial nerve disorder, unspecified: Secondary | ICD-10-CM | POA: Diagnosis not present

## 2022-11-27 DIAGNOSIS — R12 Heartburn: Secondary | ICD-10-CM | POA: Diagnosis not present

## 2022-11-27 DIAGNOSIS — M792 Neuralgia and neuritis, unspecified: Secondary | ICD-10-CM

## 2022-11-27 DIAGNOSIS — G8929 Other chronic pain: Secondary | ICD-10-CM | POA: Diagnosis not present

## 2022-11-27 DIAGNOSIS — M4727 Other spondylosis with radiculopathy, lumbosacral region: Secondary | ICD-10-CM | POA: Diagnosis not present

## 2022-11-27 DIAGNOSIS — M5459 Other low back pain: Secondary | ICD-10-CM

## 2022-11-27 DIAGNOSIS — M549 Dorsalgia, unspecified: Secondary | ICD-10-CM | POA: Diagnosis not present

## 2022-11-27 DIAGNOSIS — Z9884 Bariatric surgery status: Secondary | ICD-10-CM | POA: Diagnosis not present

## 2022-11-27 NOTE — Therapy (Signed)
OUTPATIENT PHYSICAL THERAPY TREATMENT   Patient Name: Crystal Diaz MRN: [id number] DOB:[birth year], 32 y.o., female Today's Date: 11/27/2022  END OF SESSION:  PT End of Session - 11/27/22 0738     Visit Number 3    Number of Visits 17    Date for PT Re-Evaluation 02/04/23    Authorization Type [place] [employer] reporting period from 08/04/2022    Authorization Time Period ONLY 4 MODALITIES PER DAY  VL: 25 combined PT/OT per year    Authorization - Visit Number 6    Authorization - Number of Visits 25    Progress Note Due on Visit 10    PT Start Time 0736    PT Stop Time 0815    PT Time Calculation (min) 39 min    Activity Tolerance Patient tolerated treatment well    Behavior During Therapy WFL for tasks assessed/performed             Past Medical History:  Diagnosis Date   Anemia    Anxiety    Encounter for birth control 03/23/2018   GERD (gastroesophageal reflux disease)    Headache    Lumbar radiculopathy    PCOS (polycystic ovarian syndrome)    Plantar fasciitis, bilateral    Past Surgical History:  Procedure Laterality Date   FOOT SURGERY Right    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   Procedure: LAPAROSCOPIC ROUX-EN-Y GASTRIC BYPASS WITH HIATAL HERNIA REPAIR AND UPPER ENDOSCOPY;  Surgeon: Luretha Murphy, MD;  Location: [facility];  Service: General;  Laterality: N/A;   WISDOM TOOTH EXTRACTION     Patient Active Problem List   Diagnosis Date Noted   Frequent headaches 09/06/2021   High risk medication use 08/02/2021   MDD (major depressive disorder) 04/26/2021   Vitamin D deficiency 06/08/2020   Abnormal uterine bleeding 12/02/2019   Screening for STD (sexually transmitted disease) 10/19/2018   GERD (gastroesophageal reflux disease) 03/23/2018   Encounter for annual general medical examination with abnormal findings in adult 03/23/2018   Vitamin B12 deficiency due to intestinal malabsorption 12/16/2017   GAD (generalized anxiety disorder) 11/25/2017    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 Pt associates start of back pain with episode that started while in PT for her shoulder in Spring 2023, Most recent flair up 3 months ago (pain radiating down right buttocks).  SUBJECTIVE:  SUBJECTIVE STATEMENT Patient reports she had some relief after aquatic therapy.  She checked out the hours, etc of [employer].  She reports that she has been engaging her core with transitions and this has helped a little.    PERTINENT HISTORY:  Patient is a 32 y.o. female who presents to outpatient physical therapy with a referral for medical diagnosis  lumbosacral spondylosis with radiculopathy. This patient's chief complaints consist of chronic low back pain with radiation to bilateral LE leading to the following functional deficits: difficulty with usual activities including basic mobility including bed mobility and transfers, driving, playing in the yard with her [place], yard work, being away from the bathroom, and being able to live without constant fear that she will re-experience the extreme pain she had at [employer] 2023. Relevant past medical history and comorbidities include has S/P gastric bypass; GAD (generalized anxiety disorder); Vitamin B12 deficiency due to intestinal malabsorption; GERD (gastroesophageal reflux disease); Encounter for annual general medical examination with abnormal findings in  adult; Screening for STD (sexually transmitted disease); Abnormal uterine bleeding; Vitamin D deficiency; MDD (major depressive disorder); High risk medication use; and Frequent headaches on their problem list.  has a past medical history of Anemia, Anxiety, Encounter for birth control (03/23/2018), GERD (gastroesophageal reflux disease), Headache, Lumbar radiculopathy, PCOS (polycystic ovarian syndrome), and Plantar fasciitis, bilateral.  has a past surgical history that includes Foot surgery (Right); Wisdom tooth extraction; and Gastric Roux-En-Y (N/A, 03/17/2017).  Patient denies hx of cancer, unexplained weight loss, and osteoporosis. She does endorse urinary incontinence (urge).    PAIN: Are you having pain? Yes NPRS: Current: 8/10 Pain location: lower lumbar  Pain description: burning ache  Aggravating factors: Massage, standing for long periods of time, back extension, driving for long periods of time. Relieving factors: Ice helps a lot. Turning on the heater in the drivers seat  Lyrica and valium help. Sitting with her legs up and/or legs crossed also helps. the "perfect sitting position" at work, lumbar support. Takes about 20 minutes to feel better.  FUNCTIONAL LIMITATIONS: difficulty with usual activities including basic mobility including bed mobility and transfers, driving, playing in the yard with her [place], yard work, being away from the bathroom, and being able to live without constant fear that she will re-experience the extreme pain she had at [employer] 2023.   LEISURE: Swimming, hiking, spend time with dogs, yard work (all of these are limited by her pain)   PRECAUTIONS: None  RED FLAGS:  Patient denies hx of cancer, unexplained weight loss, and osteoporosis  Patient endorses history of urinary incontinence (urge incontinence) that caused her to soil her clothes. Patient reported this was the first time this happened to this degree.  Denies urinary retention and  leaking with coughing/sneezing. Always really strong urge first.  Per patient, MD aware and he said it could be caused by nerves in her back.  Patient also endorses that her balance feels "off."   WEIGHT BEARING RESTRICTIONS: No  FALLS:  Has patient fallen in last 6 months? Yes, 1 fall when moving a vending machine and ran into a column. She felt unsteady and could not stop from falling with the vending machine. Patient does endorse feeling unsteady.   OCCUPATION: Environmental health practitioner. Patient reports she is sitting most of the time and very busy so she often doesn't notice her pain as much at work.   PLOF: Independent  PATIENT GOALS: Play with her dogs in the yard. Be functional, learn techniques  she can do at home to be comfortable   OBJECTIVE:  Note: Objective measures were completed at Evaluation unless otherwise noted.   OBJECTIVE  IMAGING Lumbar MRI report from 02/26/2022 CLINICAL DATA:  Central and bilateral lower back and buttock pain, bilateral foot numbness   EXAM: MRI LUMBAR SPINE WITHOUT CONTRAST   TECHNIQUE: Multiplanar, multisequence MR imaging of the lumbar spine was performed. No intravenous contrast was administered.   COMPARISON:  No prior MRI of the lumbar spine, correlation is made with CT lumbar spine 03/10/2021   FINDINGS: Segmentation: 5 lumbar type vertebral bodies. The lowest formed disc space is L5-S1.   Alignment: Trace retrolisthesis of L5 on S1, unchanged. Straightening of the normal lumbar lordosis. Mild dextroscoliosis.   Vertebrae:  No fracture, evidence of discitis, or bone lesion.   Conus medullaris and cauda equina: Conus extends to the L1-L2 level. Conus and cauda equina appear normal.   Paraspinal and other soft tissues: Negative.   Disc levels:   T12-L1: No significant disc bulge. No spinal canal stenosis or neural foraminal narrowing.   L1-L2: No significant disc bulge. Mild facet arthropathy. No spinal canal stenosis  or neural foraminal narrowing.   L2-L3: No significant disc bulge. Mild facet arthropathy. No spinal canal stenosis or neural foraminal narrowing.   L3-L4: No significant disc bulge. Mild facet arthropathy. No spinal canal stenosis or neural foraminal narrowing.   L4-L5: No significant disc bulge. Mild facet arthropathy. Narrowing of the left lateral recess. No spinal canal stenosis or neural foraminal narrowing.   L5-S1: Central disc extrusion with 6 mm caudal migration. This contacts the descending S1 nerve roots. Mild facet arthropathy. No spinal canal stenosis or neural foraminal narrowing.   IMPRESSION: 1. 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 2. Narrowing of the left lateral recess at L4-L5, which could affect the descending left L5 nerve roots. 3. Multilevel mild facet arthropathy.    COGNITION: Overall cognitive status: Within functional limits for tasks assessed     SENSATION: Not tested  POSTURE: increased lumbar lordosis and anterior pelvic tilt; torso "slumped forward" with lower lumbar hyperextention. Large buttocks prevents her back from contacting chairs without excessive hyperlordosis.   SPINE MOTION   LUMBAR SPINE AROM *Indicates pain Flexion: fingers to floor, feels good, OP increases pain Extension: 50% increased concordant bilateral pain over PSIS Side Flexion:       R finger to patella,  increased concordant pain over right PSIS      L finger to pateallaincreased concordant pain over right PSIS Rotation:  R 25% concordant pain across low back L 25% not as much concordant pain across low back   PERIPHERAL JOINT MOTION (in degrees) PASSIVE RANGE OF MOTION (PROM) Comments:  11/12/2022:  R hip limited in hip flexion to about 90 degress due to ipsilateral glute pain,  adduction at 90 degrees flexion and IR at 90 degrees flexion limited due to ipsilateral glute pain, bilaterally.    MUSCLE  PERFORMANCE (MMT):  *Indicates pain 11/12/22 Date Date  Joint/Motion R/L R/L R/L  Hip        Flexion (L1, L2) 4+*/5 / /  Extension (knee ext) / / /  Extension (knee flex) / / /  Abduction / / /  Knee        Extension (L3) 5*/5 / /  Flexion (S2) / / /  Ankle/Foot        Dorsiflexion (L4) 4+/5 / /  Great toe extension (L5)  5/5 / /  Eversion (S1) 5/5 / /  Comments:    SPECIAL TESTS:   LOWER LIMB NEURODYNAMIC TESTS Straight Leg Raise (Sciatic nerve)           R  = negative           L  = negative   HIP SPECIAL TESTS FADDIR: R = negative, posterior hip pain, L = negative, mild groin pain but not concordant, posterior hip pain.    SIJ SPECIAL TESTS SIJ compression: R = negative, L = negative. Thigh thrust: R = positive for right SIJ, L = negative. Sacral thrust: B = unclear   ACCESSORY MOTION: Strong concordant pain with CPA lumbar spine, worst at L5  FUNCTIONAL TESTS:  Squat: Patient completed 3 squats with pain > 8/10  TODAY'S TREATMENT:     Pt seen for aquatic therapy today.  Treatment took place in water 3.5-4.75 ft in depth at the [facility] pool. Temp of water was 91.  Pt entered/exited the pool via stairs independently with bilat rail.  * unsupported, walking forward/ backwards - 3 laps side stepping 1 lap , (Irritated L SI area) * holding wall:hip abdct/ addct x 10 * marching forward/ backward  * Holding wall:  single leg clams x 10 each; hip crosses (hip/knee flexion crossing midline) x 10 *TrA set with 1/2 noodle pull down to thighs x 12 * Marching with row motion holding 1/2 noodle  * Straddling noodle, holding corner: cycling  -> without UE support- cross country ski and reverse jumping jack, with UE on yellow hand floats, attempted cycling (decreased balance without UE support on wall) * return to walking with reciprocal arm swing -  forward/ backward in 44ft 5" water. * squats -> side step into squat with arm addct -> with rainbow hand floats - 1  lap * farmer carry with rainbow hand floats at side, walking 1/2 width forward/ backwards   Pt requires the buoyancy and hydrostatic pressure of water for support, and to offload joints by unweighting joint load by at least 50 % in navel deep water and by at least 75-80% in chest to neck deep water.  Viscosity of the water is needed for resistance of strengthening. Water current perturbations provides challenge to standing balance requiring increased core activation.     PATIENT EDUCATION:  Education details: Aquatic therapy exercise progression/ modification  Person educated: Patient Education method: Explanation Education comprehension: verbalized understanding and needs further education  HOME EXERCISE PROGRAM: TBD  ASSESSMENT:   CLINICAL IMPRESSION: Pt reported gradual reduction of back pain to 6/10 while exercising, 75-80% submerged in water. Progressed exercise with resistance, with good tolerance.  Will plan to begin creating aquatic HEP next week. Goals are ongoing.      From initial evaluation:  Patient is a 32 y.o. female referred to outpatient physical therapy with a medical diagnosis of lumbosacral spondylosis with radiculopathy who presents with signs and symptoms consistent with chronic low back pain, likely due to discogenic pain, with intermittent radiation to B LE. Patient with negative straight leg test bilaterally, but reports episodes of paresthesia below gluteal fold bilaterally at times. She does have evidence of a disc extrusion at L5-S1 affecting S1 nerve roots bilaterally on MRI from 02/26/2022, and her presentation today most closely resembles discogenic pian at that level, with CPA to L5 vertebrae most strongly reproducing concordant pain. Competing diagnoses include SIJ pain, more likely on the right. Due to widespread tenderness at the posterior pelvic region and  inability to clear the back, SIJ testing was less clear. She was negative for SIJ compression and  left thigh thrust for left SIJ, but did appear to have pain at R SIJ with right thigh thrust. Unable to interpret results of sacral thrust due to hypersensitivity in the region. Patient presents with significant pain, posture, ROM, muscle performance (strength/power/endurance), motor control, fear avoidance, education, and activity tolerance impairments that are limiting ability to complete usual activities including basic mobility including bed mobility and transfers, driving, playing in the yard with her [place], yard work, being away from the bathroom, and being able to live without constant fear that she will re-experience the extreme pain she had at [place] 2023 without difficulty. Patient would likely benefit from graded initiating PT with lumbopelvic stabilization/motor control exercises and education. She would also likely benefit from trial of aquatic therapy due to the irritability of patient's condition, high level of anxiety about re-injury, and strong interest in participating in this mode of treatment. Patient will benefit from skilled physical therapy intervention to address current body structure impairments and activity limitations to improve function and work towards goals set in current POC in order to return to prior level of function or maximal functional improvement.     OBJECTIVE IMPAIRMENTS: decreased activity tolerance, decreased balance, decreased coordination, decreased endurance, decreased knowledge of condition, decreased mobility, difficulty walking, decreased ROM, decreased strength, impaired perceived functional ability, increased muscle spasms, improper body mechanics, postural dysfunction, obesity, and pain.    ACTIVITY LIMITATIONS: sitting, standing, squatting, sleeping, transfers, bed mobility, continence, toileting, dressing, and locomotion level   PARTICIPATION LIMITATIONS: cleaning, laundry, interpersonal relationship, driving, shopping, community activity,  yard work, and  difficulty with usual activities including basic mobility including bed mobility and transfers, driving, playing in the yard with her [employer], yard work, being away from the bathroom, and being able to live without constant fear that she will re-experience the extreme pain she had at [employer] 2023   PERSONAL FACTORS: Behavior pattern, Education, Fitness, Past/current experiences, Time since onset of injury/illness/exacerbation, and 3+ comorbidities:   S/P gastric bypass; GAD (generalized anxiety disorder); Vitamin B12 deficiency due to intestinal malabsorption; GERD (gastroesophageal reflux disease); Encounter for annual general medical examination with abnormal findings in adult; Screening for STD (sexually transmitted disease); Abnormal uterine bleeding; Vitamin D deficiency; MDD (major depressive disorder); High risk medication use; and Frequent headaches on their problem list.  has a past medical history of Anemia, Anxiety, Encounter for birth control (03/23/2018), GERD (gastroesophageal reflux disease), Headache, Lumbar radiculopathy, PCOS (polycystic ovarian syndrome), and Plantar fasciitis, bilateral.  has a past surgical history that includes Foot surgery (Right); Wisdom tooth extraction; and Gastric Roux-En-Y (N/A, 03/17/2017)are also affecting patient's functional outcome.    REHAB POTENTIAL: Good   CLINICAL DECISION MAKING: Evolving/moderate complexity   EVALUATION COMPLEXITY: Moderate   GOALS: Goals reviewed with patient? No  SHORT TERM GOALS: Target date: 11/26/2022  Patient will be independent with initial home exercise program for self-management of symptoms. Baseline: Initial HEP to be provided at later visit as appropriate (11/12/22); Goal status: INITIAL   LONG TERM GOALS: Target date: 02/04/2023  Patient will be independent with a long-term home exercise program for self-management of symptoms.  Baseline: Initial HEP to be provided at later visit as  appropriate (11/12/22); Goal status: INITIAL  2.  Patient will demonstrate improved FOTO by equal or greater than 10 points by visit #10 to demonstrate improvement in overall condition and self-reported functional ability.  Baseline: to be  measured by visit #2 (11/12/22); Goal status: INITIAL  3.  Patient will demonstrate ability to complete 10 consecutive squats to 18 inch chair height with pain equal or less than 3/10 to improve her ability to get in and out of a vehicle, go hiking, and do yard work with less difficulty.   Baseline: able to complete 3 squats with 8/10 pain (11/12/22); Goal status: INITIAL  4.  Patient will demonstrate the ability to ambulate equal or greater than 1620 feet during the 6 Minute Walk Test to equal age matched norms for healthy adults of her age and improve her ability to go hiking, do yard work, and enjoy going to her son's sporting events.  Baseline: To be tested at later date (11/12/22); Goal status: INITIAL  6.  Patient will complete community, work and/or recreational activities with 50% less limitation due to current condition.  Baseline: difficulty with usual activities including basic mobility including bed mobility and transfers, driving, playing in the yard with her [place], yard work, being away from the bathroom, and being able to live without constant fear that she will re-experience the extreme pain she had at [facility] 2023 (11/13/22); Goal status: INITIAL   PLAN:   PT FREQUENCY: 1-2x/week   PT DURATION: 12 weeks   PLANNED INTERVENTIONS: 97164- PT Re-evaluation, 97110-Therapeutic exercises, 97530- Therapeutic activity, [id number]- Neuromuscular re-education, 97535- Self Care, 84696- Manual therapy, [id number]- Aquatic Therapy, 97014- Electrical stimulation (unattended), [phone]- Electrical stimulation (manual), Patient/Family education, Balance training, Dry Needling, Joint mobilization, Spinal mobilization, Cryotherapy, and Moist heat.   PLAN  FOR NEXT SESSION: aquatic physical therapy  Mayer Camel, PTA 11/27/22 8:18 AM [facility] [address] [place], [place], [address] Phone: [phone]   Fax:  [phone]

## 2022-12-01 ENCOUNTER — Ambulatory Visit (HOSPITAL_BASED_OUTPATIENT_CLINIC_OR_DEPARTMENT_OTHER): Payer: 59 | Admitting: Physical Therapy

## 2022-12-01 ENCOUNTER — Encounter (HOSPITAL_BASED_OUTPATIENT_CLINIC_OR_DEPARTMENT_OTHER): Payer: Self-pay | Admitting: Physical Therapy

## 2022-12-01 ENCOUNTER — Other Ambulatory Visit: Payer: Self-pay | Admitting: Surgery

## 2022-12-01 DIAGNOSIS — M792 Neuralgia and neuritis, unspecified: Secondary | ICD-10-CM

## 2022-12-01 DIAGNOSIS — G8929 Other chronic pain: Secondary | ICD-10-CM | POA: Diagnosis not present

## 2022-12-01 DIAGNOSIS — M6281 Muscle weakness (generalized): Secondary | ICD-10-CM

## 2022-12-01 DIAGNOSIS — M549 Dorsalgia, unspecified: Secondary | ICD-10-CM | POA: Diagnosis not present

## 2022-12-01 DIAGNOSIS — R12 Heartburn: Secondary | ICD-10-CM

## 2022-12-01 DIAGNOSIS — G529 Cranial nerve disorder, unspecified: Secondary | ICD-10-CM | POA: Diagnosis not present

## 2022-12-01 DIAGNOSIS — M545 Low back pain, unspecified: Secondary | ICD-10-CM | POA: Diagnosis not present

## 2022-12-01 DIAGNOSIS — Z9884 Bariatric surgery status: Secondary | ICD-10-CM

## 2022-12-01 DIAGNOSIS — M4727 Other spondylosis with radiculopathy, lumbosacral region: Secondary | ICD-10-CM | POA: Diagnosis not present

## 2022-12-01 DIAGNOSIS — M5459 Other low back pain: Secondary | ICD-10-CM

## 2022-12-01 NOTE — Therapy (Signed)
OUTPATIENT PHYSICAL THERAPY TREATMENT   Patient Name: Crystal Diaz MRN: [id number] DOB:24-Sep-1990, 32 y.o., female Today's Date: 12/01/2022  END OF SESSION:  PT End of Session - 12/01/22 6578     Visit Number 4    Number of Visits 17    Date for PT Re-Evaluation 02/04/23    Authorization Type [place] [employer] reporting period from 08/04/2022    Authorization Time Period ONLY 4 MODALITIES PER DAY  VL: 25 combined PT/OT per year    Authorization - Visit Number 7    Authorization - Number of Visits 25    Progress Note Due on Visit 10    PT Start Time 0817    PT Stop Time 0855    PT Time Calculation (min) 38 min    Activity Tolerance Patient tolerated treatment well    Behavior During Therapy [facility] for tasks assessed/performed             Past Medical History:  Diagnosis Date   Anemia    Anxiety    Encounter for birth control 03/23/2018   GERD (gastroesophageal reflux disease)    Headache    Lumbar radiculopathy    PCOS (polycystic ovarian syndrome)    Plantar fasciitis, bilateral    Past Surgical History:  Procedure Laterality Date   FOOT SURGERY Right    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   Procedure: LAPAROSCOPIC ROUX-EN-Y GASTRIC BYPASS WITH HIATAL HERNIA REPAIR AND UPPER ENDOSCOPY;  Surgeon: Luretha Murphy, MD;  Location: [facility];  Service: General;  Laterality: N/A;   WISDOM TOOTH EXTRACTION     Patient Active Problem List   Diagnosis Date Noted   Frequent headaches 09/06/2021   High risk medication use 08/02/2021   MDD (major depressive disorder) 04/26/2021   Vitamin D deficiency 06/08/2020   Abnormal uterine bleeding 12/02/2019   Screening for STD (sexually transmitted disease) 10/19/2018   GERD (gastroesophageal reflux disease) 03/23/2018   Encounter for annual general medical examination with abnormal findings in adult 03/23/2018   Vitamin B12 deficiency due to intestinal malabsorption 12/16/2017   GAD (generalized anxiety disorder) 11/25/2017    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 Pt associates start of back pain with episode that started while in PT for her shoulder in Spring 2023, Most recent flair up 3 months ago (pain radiating down right buttocks).  SUBJECTIVE:  SUBJECTIVE STATEMENT Patient reports she had some relief after aquatic therapy. She reports she did some things this weekend she probably shouldn't have (ie: move couch to put up holiday decor).  Pt reports she has a bone CT on Friday.    PERTINENT HISTORY:  Patient is a 32 y.o. female who presents to outpatient physical therapy with a referral for medical diagnosis  lumbosacral spondylosis with radiculopathy. This patient's chief complaints consist of chronic low back pain with radiation to bilateral LE leading to the following functional deficits: difficulty with usual activities including basic mobility including bed mobility and transfers, driving, playing in the yard with her [place], yard work, being away from the bathroom, and being able to live without constant fear that she will re-experience the extreme pain she had at [employer] 2023. Relevant past medical history and comorbidities include has S/P gastric bypass; GAD (generalized anxiety disorder); Vitamin B12 deficiency due to intestinal malabsorption; GERD (gastroesophageal reflux disease); Encounter for annual general medical examination with abnormal findings in  adult; Screening for STD (sexually transmitted disease); Abnormal uterine bleeding; Vitamin D deficiency; MDD (major depressive disorder); High risk medication use; and Frequent headaches on their problem list.  has a past medical history of Anemia, Anxiety, Encounter for birth control (03/23/2018), GERD (gastroesophageal reflux disease), Headache, Lumbar radiculopathy, PCOS (polycystic ovarian syndrome), and Plantar fasciitis, bilateral.  has a past surgical history that includes Foot surgery (Right); Wisdom tooth extraction; and Gastric Roux-En-Y (N/A, 03/17/2017).  Patient denies hx of cancer, unexplained weight loss, and osteoporosis. She does endorse urinary incontinence (urge).    PAIN: Are you having pain? Yes NPRS: Current: 6/10 Pain location: lower lumbar  Pain description: burning ache  Aggravating factors: Massage, standing for long periods of time, back extension, driving for long periods of time. Relieving factors: Ice helps a lot. Turning on the heater in the drivers seat  Lyrica and valium help. Sitting with her legs up and/or legs crossed also helps. the "perfect sitting position" at work, lumbar support. Takes about 20 minutes to feel better.  FUNCTIONAL LIMITATIONS: difficulty with usual activities including basic mobility including bed mobility and transfers, driving, playing in the yard with her [place], yard work, being away from the bathroom, and being able to live without constant fear that she will re-experience the extreme pain she had at [employer] 2023.   LEISURE: Swimming, hiking, spend time with dogs, yard work (all of these are limited by her pain)   PRECAUTIONS: None  RED FLAGS:  Patient denies hx of cancer, unexplained weight loss, and osteoporosis  Patient endorses history of urinary incontinence (urge incontinence) that caused her to soil her clothes. Patient reported this was the first time this happened to this degree.  Denies urinary retention and  leaking with coughing/sneezing. Always really strong urge first.  Per patient, MD aware and he said it could be caused by nerves in her back.  Patient also endorses that her balance feels "off."   WEIGHT BEARING RESTRICTIONS: No  FALLS:  Has patient fallen in last 6 months? Yes, 1 fall when moving a vending machine and ran into a column. She felt unsteady and could not stop from falling with the vending machine. Patient does endorse feeling unsteady.   OCCUPATION: Environmental health practitioner. Patient reports she is sitting most of the time and very busy so she often doesn't notice her pain as much at work.   PLOF: Independent  PATIENT GOALS: Play with her dogs in the yard. Be functional, learn  techniques she can do at home to be comfortable   OBJECTIVE:  Note: Objective measures were completed at Evaluation unless otherwise noted.   OBJECTIVE  IMAGING Lumbar MRI report from 02/26/2022 CLINICAL DATA:  Central and bilateral lower back and buttock pain, bilateral foot numbness   EXAM: MRI LUMBAR SPINE WITHOUT CONTRAST   TECHNIQUE: Multiplanar, multisequence MR imaging of the lumbar spine was performed. No intravenous contrast was administered.   COMPARISON:  No prior MRI of the lumbar spine, correlation is made with CT lumbar spine 03/10/2021   FINDINGS: Segmentation: 5 lumbar type vertebral bodies. The lowest formed disc space is L5-S1.   Alignment: Trace retrolisthesis of L5 on S1, unchanged. Straightening of the normal lumbar lordosis. Mild dextroscoliosis.   Vertebrae:  No fracture, evidence of discitis, or bone lesion.   Conus medullaris and cauda equina: Conus extends to the L1-L2 level. Conus and cauda equina appear normal.   Paraspinal and other soft tissues: Negative.   Disc levels:   T12-L1: No significant disc bulge. No spinal canal stenosis or neural foraminal narrowing.   L1-L2: No significant disc bulge. Mild facet arthropathy. No spinal canal stenosis  or neural foraminal narrowing.   L2-L3: No significant disc bulge. Mild facet arthropathy. No spinal canal stenosis or neural foraminal narrowing.   L3-L4: No significant disc bulge. Mild facet arthropathy. No spinal canal stenosis or neural foraminal narrowing.   L4-L5: No significant disc bulge. Mild facet arthropathy. Narrowing of the left lateral recess. No spinal canal stenosis or neural foraminal narrowing.   L5-S1: Central disc extrusion with 6 mm caudal migration. This contacts the descending S1 nerve roots. Mild facet arthropathy. No spinal canal stenosis or neural foraminal narrowing.   IMPRESSION: 1. 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 2. Narrowing of the left lateral recess at L4-L5, which could affect the descending left L5 nerve roots. 3. Multilevel mild facet arthropathy.    COGNITION: Overall cognitive status: Within functional limits for tasks assessed     SENSATION: Not tested  POSTURE: increased lumbar lordosis and anterior pelvic tilt; torso "slumped forward" with lower lumbar hyperextention. Large buttocks prevents her back from contacting chairs without excessive hyperlordosis.   SPINE MOTION   LUMBAR SPINE AROM *Indicates pain Flexion: fingers to floor, feels good, OP increases pain Extension: 50% increased concordant bilateral pain over PSIS Side Flexion:       R finger to patella,  increased concordant pain over right PSIS      L finger to pateallaincreased concordant pain over right PSIS Rotation:  R 25% concordant pain across low back L 25% not as much concordant pain across low back   PERIPHERAL JOINT MOTION (in degrees) PASSIVE RANGE OF MOTION (PROM) Comments:  11/12/2022:  R hip limited in hip flexion to about 90 degress due to ipsilateral glute pain,  adduction at 90 degrees flexion and IR at 90 degrees flexion limited due to ipsilateral glute pain, bilaterally.    MUSCLE  PERFORMANCE (MMT):  *Indicates pain 11/12/22 Date Date  Joint/Motion R/L R/L R/L  Hip        Flexion (L1, L2) 4+*/5 / /  Extension (knee ext) / / /  Extension (knee flex) / / /  Abduction / / /  Knee        Extension (L3) 5*/5 / /  Flexion (S2) / / /  Ankle/Foot        Dorsiflexion (L4) 4+/5 / /  Great toe extension (  L5) 5/5 / /  Eversion (S1) 5/5 / /  Comments:    SPECIAL TESTS:   LOWER LIMB NEURODYNAMIC TESTS Straight Leg Raise (Sciatic nerve)           R  = negative           L  = negative   HIP SPECIAL TESTS FADDIR: R = negative, posterior hip pain, L = negative, mild groin pain but not concordant, posterior hip pain.    SIJ SPECIAL TESTS SIJ compression: R = negative, L = negative. Thigh thrust: R = positive for right SIJ, L = negative. Sacral thrust: B = unclear   ACCESSORY MOTION: Strong concordant pain with CPA lumbar spine, worst at L5  FUNCTIONAL TESTS:  Squat: Patient completed 3 squats with pain > 8/10  TODAY'S TREATMENT:     Pt seen for aquatic therapy today.  Treatment took place in water 3.5-4.75 ft in depth at the [facility] pool. Temp of water was 91.  Pt entered/exited the pool via stairs independently with bilat rail.  * unsupported, walking forward/ backwards - 3 laps  * side step x 1 lap; with arm addct/ abct with rainbow hand floats x 1 lap;  into wide squat x 1 lap * marching backward with bilat rainbow hand floats at side * L stretch x 20s  * squats with UE pushing down rainbow -> yellow hand float  (increased burning, stopped) * BKTC stretch with feet in bottom hole of ladder and hands on rails * return to walking forward with breast stroke arms  * staggered stance with kick board row x ~8 each LE (challenge), standard stance x 8 * plank position with hands on bench in water- hip ext 2 x 5 each LE; fire hydrants x 5 each * STS with neutral head, core engaged, and forward arm reach x 6  PATIENT EDUCATION:  Education details:  Aquatic therapy exercise progression/ modification  Person educated: Patient Education method: Explanation Education comprehension: verbalized understanding and needs further education  HOME EXERCISE PROGRAM: TBD  ASSESSMENT:   CLINICAL IMPRESSION: Pt reported gradual reduction of back pain while exercising, 75-80% submerged in water. She did report some burning in back with squats (pushing hand float under water) - reduced with rest and change in exercise.  She reported fatigue at end of session.  Improvement in pain level during transitions with STS when given cues for core engagment and body mechanics. Will plan to issue aquatic HEP next week; may finish aquatics in next 2 visits per pt request due to high co-pay. Pt is progressing towards goals.       From initial evaluation:  Patient is a 32 y.o. female referred to outpatient physical therapy with a medical diagnosis of lumbosacral spondylosis with radiculopathy who presents with signs and symptoms consistent with chronic low back pain, likely due to discogenic pain, with intermittent radiation to B LE. Patient with negative straight leg test bilaterally, but reports episodes of paresthesia below gluteal fold bilaterally at times. She does have evidence of a disc extrusion at L5-S1 affecting S1 nerve roots bilaterally on MRI from 02/26/2022, and her presentation today most closely resembles discogenic pian at that level, with CPA to L5 vertebrae most strongly reproducing concordant pain. Competing diagnoses include SIJ pain, more likely on the right. Due to widespread tenderness at the posterior pelvic region and inability to clear the back, SIJ testing was less clear. She was negative for SIJ compression and left thigh thrust for left  SIJ, but did appear to have pain at R SIJ with right thigh thrust. Unable to interpret results of sacral thrust due to hypersensitivity in the region. Patient presents with significant pain, posture, ROM, muscle  performance (strength/power/endurance), motor control, fear avoidance, education, and activity tolerance impairments that are limiting ability to complete usual activities including basic mobility including bed mobility and transfers, driving, playing in the yard with her [place], yard work, being away from the bathroom, and being able to live without constant fear that she will re-experience the extreme pain she had at [place] 2023 without difficulty. Patient would likely benefit from graded initiating PT with lumbopelvic stabilization/motor control exercises and education. She would also likely benefit from trial of aquatic therapy due to the irritability of patient's condition, high level of anxiety about re-injury, and strong interest in participating in this mode of treatment. Patient will benefit from skilled physical therapy intervention to address current body structure impairments and activity limitations to improve function and work towards goals set in current POC in order to return to prior level of function or maximal functional improvement.     OBJECTIVE IMPAIRMENTS: decreased activity tolerance, decreased balance, decreased coordination, decreased endurance, decreased knowledge of condition, decreased mobility, difficulty walking, decreased ROM, decreased strength, impaired perceived functional ability, increased muscle spasms, improper body mechanics, postural dysfunction, obesity, and pain.    ACTIVITY LIMITATIONS: sitting, standing, squatting, sleeping, transfers, bed mobility, continence, toileting, dressing, and locomotion level   PARTICIPATION LIMITATIONS: cleaning, laundry, interpersonal relationship, driving, shopping, community activity, yard work, and  difficulty with usual activities including basic mobility including bed mobility and transfers, driving, playing in the yard with her [employer], yard work, being away from the bathroom, and being able to live without  constant fear that she will re-experience the extreme pain she had at [employer] 2023   PERSONAL FACTORS: Behavior pattern, Education, Fitness, Past/current experiences, Time since onset of injury/illness/exacerbation, and 3+ comorbidities:   S/P gastric bypass; GAD (generalized anxiety disorder); Vitamin B12 deficiency due to intestinal malabsorption; GERD (gastroesophageal reflux disease); Encounter for annual general medical examination with abnormal findings in adult; Screening for STD (sexually transmitted disease); Abnormal uterine bleeding; Vitamin D deficiency; MDD (major depressive disorder); High risk medication use; and Frequent headaches on their problem list.  has a past medical history of Anemia, Anxiety, Encounter for birth control (03/23/2018), GERD (gastroesophageal reflux disease), Headache, Lumbar radiculopathy, PCOS (polycystic ovarian syndrome), and Plantar fasciitis, bilateral.  has a past surgical history that includes Foot surgery (Right); Wisdom tooth extraction; and Gastric Roux-En-Y (N/A, 03/17/2017)are also affecting patient's functional outcome.    REHAB POTENTIAL: Good   CLINICAL DECISION MAKING: Evolving/moderate complexity   EVALUATION COMPLEXITY: Moderate   GOALS: Goals reviewed with patient? No  SHORT TERM GOALS: Target date: 11/26/2022  Patient will be independent with initial home exercise program for self-management of symptoms. Baseline: Initial HEP to be provided at later visit as appropriate (11/12/22); Goal status: INITIAL   LONG TERM GOALS: Target date: 02/04/2023  Patient will be independent with a long-term home exercise program for self-management of symptoms.  Baseline: Initial HEP to be provided at later visit as appropriate (11/12/22); Goal status: INITIAL  2.  Patient will demonstrate improved FOTO by equal or greater than 10 points by visit #10 to demonstrate improvement in overall condition and self-reported functional ability.  Baseline: to  be measured by visit #2 (11/12/22); Goal status: INITIAL  3.  Patient will demonstrate ability to complete 10 consecutive squats to 18  inch chair height with pain equal or less than 3/10 to improve her ability to get in and out of a vehicle, go hiking, and do yard work with less difficulty.   Baseline: able to complete 3 squats with 8/10 pain (11/12/22); Goal status: INITIAL  4.  Patient will demonstrate the ability to ambulate equal or greater than 1620 feet during the 6 Minute Walk Test to equal age matched norms for healthy adults of her age and improve her ability to go hiking, do yard work, and enjoy going to her son's sporting events.  Baseline: To be tested at later date (11/12/22); Goal status: INITIAL  6.  Patient will complete community, work and/or recreational activities with 50% less limitation due to current condition.  Baseline: difficulty with usual activities including basic mobility including bed mobility and transfers, driving, playing in the yard with her [place], yard work, being away from the bathroom, and being able to live without constant fear that she will re-experience the extreme pain she had at [facility] 2023 (11/13/22); Goal status: INITIAL   PLAN:   PT FREQUENCY: 1-2x/week   PT DURATION: 12 weeks   PLANNED INTERVENTIONS: 97164- PT Re-evaluation, 97110-Therapeutic exercises, 97530- Therapeutic activity, [id number]- Neuromuscular re-education, 97535- Self Care, 32951- Manual therapy, [id number]- Aquatic Therapy, 97014- Electrical stimulation (unattended), [phone]- Electrical stimulation (manual), Patient/Family education, Balance training, Dry Needling, Joint mobilization, Spinal mobilization, Cryotherapy, and Moist heat.   PLAN FOR NEXT SESSION: aquatic physical therapy  Mayer Camel, PTA 12/01/22 10:17 AM [facility] [address] [place], [place], [address] Phone: [phone]   Fax:   [phone]

## 2022-12-03 ENCOUNTER — Ambulatory Visit: Payer: 59 | Admitting: Physical Therapy

## 2022-12-03 ENCOUNTER — Ambulatory Visit (HOSPITAL_BASED_OUTPATIENT_CLINIC_OR_DEPARTMENT_OTHER): Payer: 59 | Admitting: Physical Therapy

## 2022-12-08 ENCOUNTER — Ambulatory Visit (HOSPITAL_BASED_OUTPATIENT_CLINIC_OR_DEPARTMENT_OTHER): Payer: 59 | Admitting: Physical Therapy

## 2022-12-08 ENCOUNTER — Telehealth (HOSPITAL_COMMUNITY): Payer: 59 | Admitting: Psychiatry

## 2022-12-08 ENCOUNTER — Other Ambulatory Visit: Payer: Self-pay

## 2022-12-15 ENCOUNTER — Encounter: Payer: Self-pay | Admitting: Professional

## 2022-12-15 ENCOUNTER — Ambulatory Visit (INDEPENDENT_AMBULATORY_CARE_PROVIDER_SITE_OTHER): Payer: 59 | Admitting: Professional

## 2022-12-15 ENCOUNTER — Inpatient Hospital Stay: Admission: RE | Admit: 2022-12-15 | Payer: 59 | Source: Ambulatory Visit

## 2022-12-15 DIAGNOSIS — F331 Major depressive disorder, recurrent, moderate: Secondary | ICD-10-CM

## 2022-12-15 DIAGNOSIS — F419 Anxiety disorder, unspecified: Secondary | ICD-10-CM

## 2022-12-15 DIAGNOSIS — F439 Reaction to severe stress, unspecified: Secondary | ICD-10-CM
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
## 2022-12-16 ENCOUNTER — Ambulatory Visit (HOSPITAL_BASED_OUTPATIENT_CLINIC_OR_DEPARTMENT_OTHER): Payer: Self-pay | Admitting: Physical Therapy

## 2022-12-16 ENCOUNTER — Other Ambulatory Visit: Payer: Self-pay

## 2022-12-22 ENCOUNTER — Other Ambulatory Visit: Payer: 59

## 2022-12-22 ENCOUNTER — Ambulatory Visit: Payer: 59 | Attending: Neurosurgery | Admitting: Physical Therapy

## 2022-12-22 ENCOUNTER — Encounter: Payer: Self-pay | Admitting: Physical Therapy

## 2022-12-22 DIAGNOSIS — M6281 Muscle weakness (generalized): Secondary | ICD-10-CM | POA: Insufficient documentation

## 2022-12-22 DIAGNOSIS — M5459 Other low back pain: Secondary | ICD-10-CM | POA: Insufficient documentation

## 2022-12-22 DIAGNOSIS — M792 Neuralgia and neuritis, unspecified: Secondary | ICD-10-CM | POA: Insufficient documentation

## 2022-12-22 NOTE — Therapy (Signed)
OUTPATIENT PHYSICAL THERAPY TREATMENT / DISCHARGE SUMMARY Dates of reporting from 11/12/2022 to 12/22/2022   Patient Name: Crystal Diaz MRN: [id number] DOB:[birth year], 32 y.o., female Today's Date: 12/22/2022  END OF SESSION:  PT End of Session - 12/22/22 1825     Visit Number 5    Number of Visits 17    Date for PT Re-Evaluation 02/04/23    Authorization Type [place] [employer] F[employer] reporting period from 11/12/2022    Authorization Time Period ONLY 4 MODALITIES PER DAY  VL: 25 combined PT/OT per year    Authorization - Visit Number 8    Authorization - Number of Visits 25    Progress Note Due on Visit 10    PT Start Time 1737    PT Stop Time 1815    PT Time Calculation (min) 38 min    Activity Tolerance Patient tolerated treatment well    Behavior During Therapy WFL for tasks assessed/performed              Past Medical History:  Diagnosis Date   Anemia    Anxiety    Encounter for birth control 03/23/2018   GERD (gastroesophageal reflux disease)    Headache    Lumbar radiculopathy    PCOS (polycystic ovarian syndrome)    Plantar fasciitis, bilateral    Past Surgical History:  Procedure Laterality Date   FOOT SURGERY Right    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   Procedure: LAPAROSCOPIC ROUX-EN-Y GASTRIC BYPASS WITH HIATAL HERNIA REPAIR AND UPPER ENDOSCOPY;  Surgeon: Luretha Murphy, MD;  Location: [facility];  Service: General;  Laterality: N/A;   WISDOM TOOTH EXTRACTION     Patient Active Problem List   Diagnosis Date Noted   Frequent headaches 09/06/2021   High risk medication use 08/02/2021   MDD (major depressive disorder) 04/26/2021   Vitamin D deficiency 06/08/2020   Abnormal uterine bleeding 12/02/2019   Screening for STD (sexually transmitted disease) 10/19/2018   GERD (gastroesophageal reflux disease) 03/23/2018   Encounter for annual general medical examination with abnormal findings in adult 03/23/2018   Vitamin B12 deficiency due to intestinal  malabsorption 12/16/2017   GAD (generalized anxiety disorder) 11/25/2017   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 Pt associates start of back pain with episode that started while in PT for her shoulder in Spring 2023, Most recent flair up 3 months ago (pain radiating down right buttocks).  SUBJECTIVE:  SUBJECTIVE STATEMENT Patient reports she does not feel she needs to go back to aquatic PT in [place] any longer. She states it is too cold to go early in the morning and she feels she has learned everything she needs from aquatic. She has signed up for the local aquatic center and has gone and done her aquatic HEP there 4 times successfully. She has minimal pain in the water but she feels heavy when she gets out. She has improved pain for several days after each aquatic session. She is planning to continue with her aquatic exercises and possibly join water aerobics classes at some point. She is on the wait list to follow up with her surgeon. She continues to have a lot of pain at night that keeps her from sleeping well. She has been doing some jogging. She would also like to start doing squats because she noticed she feels weak with squatting. She requests discharge today due to financial limitations.   PERTINENT HISTORY:  Patient is a 33 y.o. female who presents to outpatient physical therapy with a referral for medical diagnosis  lumbosacral spondylosis with  radiculopathy. This patient's chief complaints consist of chronic low back pain with radiation to bilateral LE leading to the following functional deficits: difficulty with usual activities including basic mobility including bed mobility and transfers, driving, playing in the yard with her [place], yard work, being away from the bathroom, and being able to live without constant fear that she will re-experience the extreme pain she had at [employer] 2023. Relevant past medical history and comorbidities include has S/P gastric bypass; GAD (generalized anxiety disorder); Vitamin B12 deficiency due to intestinal malabsorption; GERD (gastroesophageal reflux disease); Encounter for annual general medical examination with abnormal findings in adult; Screening for STD (sexually transmitted disease); Abnormal uterine bleeding; Vitamin D deficiency; MDD (major depressive disorder); High risk medication use; and Frequent headaches on their problem list.  has a past medical history of Anemia, Anxiety, Encounter for birth control (03/23/2018), GERD (gastroesophageal reflux disease), Headache, Lumbar radiculopathy, PCOS (polycystic ovarian syndrome), and Plantar fasciitis, bilateral.  has a past surgical history that includes Foot surgery (Right); Wisdom tooth extraction; and Gastric Roux-En-Y (N/A, 03/17/2017).  Patient denies hx of cancer, unexplained weight loss, and osteoporosis. She does endorse urinary incontinence (urge).    PAIN: Are you having pain? Yes NPRS: Current: 6/10 Pain location: lower lumbar  Pain description: burning ache  Aggravating factors: Massage, standing for long periods of time, back extension, driving for long periods of time. Relieving factors: Ice helps a lot. Turning on the heater in the drivers seat  Lyrica and valium help. Sitting with her legs up and/or legs crossed also helps. the "perfect sitting position" at work, lumbar support. Takes about 20 minutes to feel  better.  FUNCTIONAL LIMITATIONS: difficulty with usual activities including basic mobility including bed mobility and transfers, driving, playing in the yard with her [place], yard work, being away from the bathroom, and being able to live without constant fear that she will re-experience the extreme pain she had at [employer] 2023.   LEISURE: Swimming, hiking, spend time with dogs, yard work (all of these are limited by her pain)   PRECAUTIONS: None  RED FLAGS:  Patient denies hx of cancer, unexplained weight loss, and osteoporosis  Patient endorses history of urinary incontinence (urge incontinence) that caused her to soil her clothes. Patient reported this was the first time this happened to this degree.  Denies urinary retention and leaking with  coughing/sneezing. Always really strong urge first.  Per patient, MD aware and he said it could be caused by nerves in her back.  Patient also endorses that her balance feels "off."   WEIGHT BEARING RESTRICTIONS: No  FALLS:  Has patient fallen in last 6 months? Yes, 1 fall when moving a vending machine and ran into a column. She felt unsteady and could not stop from falling with the vending machine. Patient does endorse feeling unsteady.   OCCUPATION: Environmental health practitioner. Patient reports she is sitting most of the time and very busy so she often doesn't notice her pain as much at work.   PLOF: Independent  PATIENT GOALS: Play with her dogs in the yard. Be functional, learn techniques she can do at home to be comfortable   OBJECTIVE:   SELF-REPORTED FUNCTION FOTO score: 42/100 (lumbar spine questionnaire)  FUNCTIONAL TESTS:  Squat: Patient completed 5 consecutive squats to a 20 inch chair with pain 7/10 6 Minute Walk Test: 1400 feet with mild increase in pain at TL junction  TODAY'S TREATMENT:    Therapeutic activities: dynamic activities for functional strengthening and improved functional activity tolerance. - 6  minute walk test and squat testing (see above) - squats with buttocks tap on 18.5 inch chair with airex pad in it (20 inches), 3x5 plus additional reps to improve knee alignment and form.  - education on postures to try during sleep - attempted staggered stance, partial range kickstand deadlift with left foot front, 10#KB held in right UE, to 9 inch stool. 1x5 (discontinued due to poor form/coordination and insufficient time to teach/improve form sufficiently to discharge this session).   Pt required multimodal cuing for proper technique and to facilitate improved neuromuscular control, strength, range of motion, and functional ability resulting in improved performance and form.    PATIENT EDUCATION:  Education details: Aquatic therapy exercise progression/ modification  Person educated: Patient Education method: Explanation Education comprehension: verbalized understanding and needs further education  HOME EXERCISE PROGRAM: Aquatic HEP  ASSESSMENT:   CLINICAL IMPRESSION: Patient has attended 5 physical therapy sessions since starting current episode of care on 11/12/2022. Patient has been successful in receiving a long term aquatic HEP that she has completed 4 times independently since her last aquatic PT visit. She has made progress with all of her goals but has not yet reached her max potential improvement. She demonstrates high level of motivation and consistency in participation.  However, she has requested discharge at this time due to financial limitations. She understands she would likely benefit from continued PT when she is able to return. She was educated on how to start incorporating squats into her current fitness routine and demonstrated significant improvement in form with cuing. She is now discharged from PT due to financial limitations.    OBJECTIVE IMPAIRMENTS: decreased activity tolerance, decreased balance, decreased coordination, decreased endurance, decreased knowledge of  condition, decreased mobility, difficulty walking, decreased ROM, decreased strength, impaired perceived functional ability, increased muscle spasms, improper body mechanics, postural dysfunction, obesity, and pain.    ACTIVITY LIMITATIONS: sitting, standing, squatting, sleeping, transfers, bed mobility, continence, toileting, dressing, and locomotion level   PARTICIPATION LIMITATIONS: cleaning, laundry, interpersonal relationship, driving, shopping, community activity, yard work, and  difficulty with usual activities including basic mobility including bed mobility and transfers, driving, playing in the yard with her [employer], yard work, being away from the bathroom, and being able to live without constant fear that she will re-experience the extreme pain she had at [employer] 2023  PERSONAL FACTORS: Behavior pattern, Education, Fitness, Past/current experiences, Time since onset of injury/illness/exacerbation, and 3+ comorbidities:   S/P gastric bypass; GAD (generalized anxiety disorder); Vitamin B12 deficiency due to intestinal malabsorption; GERD (gastroesophageal reflux disease); Encounter for annual general medical examination with abnormal findings in adult; Screening for STD (sexually transmitted disease); Abnormal uterine bleeding; Vitamin D deficiency; MDD (major depressive disorder); High risk medication use; and Frequent headaches on their problem list.  has a past medical history of Anemia, Anxiety, Encounter for birth control (03/23/2018), GERD (gastroesophageal reflux disease), Headache, Lumbar radiculopathy, PCOS (polycystic ovarian syndrome), and Plantar fasciitis, bilateral.  has a past surgical history that includes Foot surgery (Right); Wisdom tooth extraction; and Gastric Roux-En-Y (N/A, 03/17/2017)are also affecting patient's functional outcome.    REHAB POTENTIAL: Good   CLINICAL DECISION MAKING: Evolving/moderate complexity   EVALUATION COMPLEXITY:  Moderate   GOALS: Goals reviewed with patient? No  SHORT TERM GOALS: Target date: 11/26/2022  Patient will be independent with initial home exercise program for self-management of symptoms. Baseline: Initial HEP to be provided at later visit as appropriate (11/12/22); Goal status: MET   LONG TERM GOALS: Target date: 02/04/2023  Patient will be independent with a long-term home exercise program for self-management of symptoms.  Baseline: Initial HEP to be provided at later visit as appropriate (11/12/22); independent with aquatic HEP Goal status: MET  2.  Patient will demonstrate improved FOTO by equal or greater than 10 points by visit #10 to demonstrate improvement in overall condition and self-reported functional ability.  Baseline: to be measured by visit #2 (11/12/22); 32 before visit #2 (11/17/2022); 42 at visit #5 (12/22/2022);  Goal status: Partially MET  3.  Patient will demonstrate ability to complete 10 consecutive squats to 18 inch chair height with pain equal or less than 3/10 to improve her ability to get in and out of a vehicle, go hiking, and do yard work with less difficulty.   Baseline: able to complete 3 squats with 8/10 pain (11/12/22); Patient completed 5 consecutive squats to a 20 inch chair with pain 7/10 (12/22/2022);  Goal status: Partially MET  4.  Patient will demonstrate the ability to ambulate equal or greater than 1620 feet during the 6 Minute Walk Test to equal age matched norms for healthy adults of her age and improve her ability to go hiking, do yard work, and enjoy going to her son's sporting events.  Baseline: To be tested at later date (11/12/22); 1400 feet with mild increase in pain at TL junction (12/22/2022);  Goal status: Partially MET  6.  Patient will complete community, work and/or recreational activities with 50% less limitation due to current condition.  Baseline: difficulty with usual activities including basic mobility including bed mobility  and transfers, driving, playing in the yard with her [place], yard work, being away from the bathroom, and being able to live without constant fear that she will re-experience the extreme pain she had at [place] 2023 (11/13/22); estimates 30% improvement (12/22/2022);  Goal status: Partially MET   PLAN:   PT FREQUENCY: 1-2x/week   PT DURATION: 12 weeks   PLANNED INTERVENTIONS: 97164- PT Re-evaluation, 97110-Therapeutic exercises, 97530- Therapeutic activity, [id number]- Neuromuscular re-education, 97535- Self Care, 40981- Manual therapy, [id number]- Aquatic Therapy, 97014- Electrical stimulation (unattended), [phone]- Electrical stimulation (manual), Patient/Family education, Balance training, Dry Needling, Joint mobilization, Spinal mobilization, Cryotherapy, and Moist heat.   PLAN FOR NEXT SESSION: patient is now discharged from PT.    Luretha Murphy. Ilsa Iha, PT, DPT 12/22/22, 6:53 PM  [facility] [address] [place], [place] [address] P: [phone] I F: [phone]

## 2022-12-23 ENCOUNTER — Ambulatory Visit (HOSPITAL_BASED_OUTPATIENT_CLINIC_OR_DEPARTMENT_OTHER): Payer: Self-pay | Admitting: Physical Therapy

## 2022-12-24 ENCOUNTER — Other Ambulatory Visit: Payer: Self-pay

## 2022-12-29 ENCOUNTER — Other Ambulatory Visit: Payer: 59

## 2022-12-29 ENCOUNTER — Ambulatory Visit: Payer: 59 | Admitting: Dietician

## 2022-12-29 ENCOUNTER — Ambulatory Visit: Payer: Self-pay | Admitting: Professional

## 2022-12-30 ENCOUNTER — Ambulatory Visit (HOSPITAL_BASED_OUTPATIENT_CLINIC_OR_DEPARTMENT_OTHER): Payer: 59 | Admitting: Physical Therapy

## 2023-01-05 ENCOUNTER — Other Ambulatory Visit: Payer: 59

## 2023-01-12 ENCOUNTER — Encounter: Payer: 59 | Attending: Surgery | Admitting: Dietician

## 2023-01-12 ENCOUNTER — Encounter: Payer: Self-pay | Admitting: Dietician

## 2023-01-12 ENCOUNTER — Ambulatory Visit
Admission: RE | Admit: 2023-01-12 | Discharge: 2023-01-12 | Disposition: A | Discharge status: Home / Self Care | Payer: 59 | Source: Ambulatory Visit | Attending: Surgery | Admitting: Surgery

## 2023-01-12 DIAGNOSIS — Z9884 Bariatric surgery status: Secondary | ICD-10-CM

## 2023-01-12 DIAGNOSIS — R12 Heartburn: Secondary | ICD-10-CM

## 2023-01-12 NOTE — Progress Notes (Signed)
Medical Nutrition Therapy  Appointment Start time:  0805  Appointment End time:  0925 Employee Wellness Visit 1 of 3 EID#: [id number]  Primary concerns today: Weight Gain,   Referral diagnosis: R12 - Heartburn, E66.9 - Obesity Preferred learning style: No preference indicated Learning readiness: Ready   NUTRITION ASSESSMENT    Clinical Medical Hx: Bariatric surgery, Vit D deficiency, B12 deficiency, GERD Medications: Pantoprazole, B12 IM injections Labs: Reviewed Notable Signs/Symptoms: Reflux   Lifestyle & Dietary Hx Pt reports history of gastric bypass in 2019, lost~ 100 lbs. In 8-10 months, pt reports being able to maintain weight for a couple of years but is concerned about it going back up recently. 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 Pt reports weight gain and symptoms of reflux that have returned since starting business. 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 Pt reports wanting to meal plan, but states they do not like to cook, eats a lot of outside foods. Pt reports recent back injury (bulging disc) that hurts if they stand for an extended period of time, just finished PT but still has some discomfort.    Estimated daily fluid intake: 60 oz Supplements: None Sleep: Sleeps poorly, back pain Stress / self-care: Works with therapist Current average weekly physical activity: ADLs, walks dogs   24-Hr Dietary Recall First Meal: Pot roast w/ carrots, onions, potatoes, gravy, yellow rice Snack: 2 mini cinnabons Second Meal:  Snack:  Third Meal: 2 slices Spinach tomato thin crust pizza Snack:  Beverages: Water,    NUTRITION DIAGNOSIS  -1.4 Altered GI function As related to bariatric surgery.  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   NUTRITION INTERVENTION  Nutrition education (E-1) on the following topics:  Educated patient on the two components of energy balance: Energy in (calories), and energy out (activity). Explain the role of negative energy balance in weight loss. Discussed options with patient to achieve a negative energy balance and how to best control energy in and energy out to accommodate their lifestyle.   Handouts Provided Include  AVS  Learning Style & Readiness for Change Teaching method utilized: Visual & Auditory  Demonstrated degree of understanding via: Teach Back  Barriers to learning/adherence to lifestyle change: Busy schedule  Goals Established by Pt Begin to take a daily bariatric multivitamin again. Begin to work on lowering your consumption of packaged snacks from your vending machines. Begin to keep fruit and 100 calorie Emerald nut packs with you during the day on Friday to snack on in place of Cheez-Its or chips! Look for Harvest Snaps for a higher protein, higher fiber snack option.   MONITORING & EVALUATION Dietary intake, weekly physical activity, and weight change in 2 months.  Next Steps  Patient is to follow up for wellness visit 2 of 3

## 2023-01-12 NOTE — Patient Instructions (Addendum)
Begin to take a daily bariatric multivitamin again.  Begin to work on lowering your consumption of packaged snacks from your vending machines.  Begin to keep fruit and 100 calorie Emerald nut packs with you during the day on Friday to snack on in place of Cheez-Its or chips!  Look for Harvest Snaps for a higher protein, higher fiber snack option.

## 2023-01-15 ENCOUNTER — Other Ambulatory Visit: Payer: Self-pay | Admitting: Primary Care

## 2023-01-15 ENCOUNTER — Other Ambulatory Visit: Payer: Self-pay

## 2023-01-15 ENCOUNTER — Other Ambulatory Visit (HOSPITAL_COMMUNITY): Payer: Self-pay

## 2023-01-15 DIAGNOSIS — E538 Deficiency of other specified B group vitamins: Secondary | ICD-10-CM

## 2023-01-15 MED ORDER — BD LUER-LOK SYRINGE 23G X 1" 3 ML MISC
0 refills | Status: AC
  Filled 2023-01-15: qty 6, 168d supply, fill #0

## 2023-01-15 MED FILL — Cyanocobalamin Inj 1000 MCG/ML: INTRAMUSCULAR | 84 days supply | Qty: 3 | Fill #0 | Status: CN

## 2023-01-16 ENCOUNTER — Other Ambulatory Visit (HOSPITAL_COMMUNITY): Payer: Self-pay

## 2023-01-16 ENCOUNTER — Other Ambulatory Visit: Payer: Self-pay

## 2023-01-26 ENCOUNTER — Encounter: Payer: Self-pay | Admitting: Professional

## 2023-01-26 ENCOUNTER — Telehealth (HOSPITAL_COMMUNITY): Payer: Self-pay | Admitting: Psychiatry

## 2023-01-26 ENCOUNTER — Ambulatory Visit: Payer: 59 | Admitting: Professional

## 2023-01-26 DIAGNOSIS — F419 Anxiety disorder, unspecified: Secondary | ICD-10-CM | POA: Diagnosis not present

## 2023-01-26 DIAGNOSIS — F331 Major depressive disorder, recurrent, moderate: Secondary | ICD-10-CM

## 2023-01-26 NOTE — Progress Notes (Signed)
 Marland Kitchen

## 2023-01-26 NOTE — Progress Notes (Deleted)
 [facility] Counselor/Therapist Progress Note   Patient ID: Crystal Diaz, MRN: [id number],     Date: 02/03/2023   Time Spent: 47 minutes 1158am- 1245pm   Treatment Type: Individual Therapy   Risk Assessment: Danger to Self:  No Self-injurious Behavior: No Danger to Others: No   Subjective: This session was held via video teletherapy. The patient consented to video teletherapy and was located in her office during this session. She is aware it is the responsibility of the patient to secure confidentiality on her end of the session. The provider was in a private home office for the duration of this session.    The patient arrived on time for her appointment.    Issues addressed: 1-Christmas NYE (mother's death), and her birthday -brother came to her house for Christmas day -"he doesn't put me in a good mood" -it was good to have him there because he is my brother and she did not wake up Christmas morning without family -her birthday is coming up 2-mood Phq-9     01/28/2023    1:18 PM 01/26/2023   12:22 PM 01/12/2023    8:19 AM  Depression screen PHQ 2/9  Decreased Interest  3 0  Down, Depressed, Hopeless  2 0  PHQ - 2 Score  5 0  Altered sleeping  3    Tired, decreased energy  2    Change in appetite  2    Feeling bad or failure about yourself   2    Trouble concentrating  1    Moving slowly or fidgety/restless  0    Suicidal thoughts  1    PHQ-9 Score  16    Difficult doing work/chores  Very difficult       Information is confidential and restricted. Go to Review Flowsheets to unlock data.    3-referral to IOP -pt struggling with limited supports available through OPT services -pt offered IOP referral -discussed need for inpt and pt denies any current suicidal ideation with plan or intent; endorses fleeting thoughts   Diagnosis:MDD (major depressive disorder), recurrent episode, moderate (HCC)   Anxiety disorder, unspecified type   Plan:  -next  session will occur after patient is discharged from MH-IOP -tx plan will be completed at that time given that pt was in crisis while attending this appointment and treatment plan was referral to IOP -pt admitted to MH-IOP and will resume individual therapy upon discharge from MH-IOP.

## 2023-01-26 NOTE — Telephone Encounter (Signed)
 D:  Crystal Diaz, [facility] referred pt to virtual MH-IOP.  A:  Placed call and re-oriented pt.  Scheduled CCA for 01-28-23 @ 1 pm.  Ihor Dow.  R:  Pt receptive.

## 2023-01-27 ENCOUNTER — Other Ambulatory Visit (HOSPITAL_COMMUNITY): Payer: Self-pay

## 2023-01-28 ENCOUNTER — Other Ambulatory Visit (HOSPITAL_COMMUNITY): Payer: 59 | Attending: Psychiatry | Admitting: Psychiatry

## 2023-01-28 DIAGNOSIS — R45851 Suicidal ideations: Secondary | ICD-10-CM | POA: Insufficient documentation

## 2023-01-28 DIAGNOSIS — Z79899 Other long term (current) drug therapy: Secondary | ICD-10-CM | POA: Insufficient documentation

## 2023-01-28 DIAGNOSIS — G8929 Other chronic pain: Secondary | ICD-10-CM | POA: Insufficient documentation

## 2023-01-28 DIAGNOSIS — F332 Major depressive disorder, recurrent severe without psychotic features: Secondary | ICD-10-CM | POA: Insufficient documentation

## 2023-01-28 DIAGNOSIS — F331 Major depressive disorder, recurrent, moderate: Secondary | ICD-10-CM | POA: Insufficient documentation

## 2023-01-28 DIAGNOSIS — Z1330 Encounter for screening examination for mental health and behavioral disorders, unspecified: Secondary | ICD-10-CM

## 2023-01-28 DIAGNOSIS — F419 Anxiety disorder, unspecified: Secondary | ICD-10-CM | POA: Insufficient documentation

## 2023-01-28 DIAGNOSIS — Z658 Other specified problems related to psychosocial circumstances: Secondary | ICD-10-CM | POA: Insufficient documentation

## 2023-01-28 DIAGNOSIS — M4727 Other spondylosis with radiculopathy, lumbosacral region: Secondary | ICD-10-CM | POA: Insufficient documentation
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
## 2023-01-30 ENCOUNTER — Telehealth (HOSPITAL_COMMUNITY): Payer: Self-pay | Admitting: Psychiatry

## 2023-02-02 ENCOUNTER — Ambulatory Visit: Payer: 59 | Admitting: Dietician

## 2023-02-03 ENCOUNTER — Ambulatory Visit: Payer: Self-pay | Admitting: Professional

## 2023-02-03 NOTE — Progress Notes (Signed)
[facility] Counselor/Therapist Progress Note   Patient ID: Crystal Diaz, MRN: [id number],     Date: 02/03/2023   Time Spent: 47 minutes 1158am- 1245pm   Treatment Type: Individual Therapy   Risk Assessment: Danger to Self:  No Self-injurious Behavior: No Danger to Others: No   Subjective: This session was held via video teletherapy. The patient consented to video teletherapy and was located in her office during this session. She is aware it is the responsibility of the patient to secure confidentiality on her end of the session. The provider was in a private home office for the duration of this session.    The patient arrived on time for her appointment.    Issues addressed: 1-Christmas NYE (mother's death), and her birthday -brother came to her house for Christmas day -"he doesn't put me in a good mood" -it was good to have him there because he is my brother and she did not wake up Christmas morning without family -her birthday is coming up 2-mood Phq-9     01/28/2023    1:18 PM 01/26/2023   12:22 PM 01/12/2023    8:19 AM  Depression screen PHQ 2/9  Decreased Interest  3 0  Down, Depressed, Hopeless  2 0  PHQ - 2 Score  5 0  Altered sleeping  3    Tired, decreased energy  2    Change in appetite  2    Feeling bad or failure about yourself   2    Trouble concentrating  1    Moving slowly or fidgety/restless  0    Suicidal thoughts  1    PHQ-9 Score  16    Difficult doing work/chores  Very difficult       Information is confidential and restricted. Go to Review Flowsheets to unlock data.    3-referral to IOP -pt struggling with limited supports available through OPT services -pt offered IOP referral -discussed need for inpt and pt denies any current suicidal ideation with plan or intent; endorses fleeting thoughts   Diagnosis:MDD (major depressive disorder), recurrent episode, moderate (HCC)   Anxiety disorder, unspecified type   Plan:  -next  session will occur after patient is discharged from MH-IOP -tx plan will be completed at that time given that pt was in crisis while attending this appointment and treatment plan was referral to IOP -pt admitted to MH-IOP and will resume individual therapy upon discharge from MH-IOP.

## 2023-02-04 ENCOUNTER — Encounter (HOSPITAL_COMMUNITY): Payer: Self-pay | Admitting: Psychiatry

## 2023-02-04 ENCOUNTER — Ambulatory Visit: Payer: 59 | Admitting: Professional

## 2023-02-04 ENCOUNTER — Encounter: Payer: Self-pay | Admitting: Professional

## 2023-02-04 ENCOUNTER — Other Ambulatory Visit: Payer: Self-pay

## 2023-02-04 ENCOUNTER — Telehealth (HOSPITAL_COMMUNITY): Payer: 59 | Admitting: Psychiatry

## 2023-02-04 VITALS — Wt 245.0 lb

## 2023-02-04 DIAGNOSIS — F419 Anxiety disorder, unspecified: Secondary | ICD-10-CM

## 2023-02-04 DIAGNOSIS — F331 Major depressive disorder, recurrent, moderate: Secondary | ICD-10-CM

## 2023-02-04 DIAGNOSIS — F439 Reaction to severe stress, unspecified: Secondary | ICD-10-CM

## 2023-02-04 MED ORDER — ARIPIPRAZOLE 15 MG PO TABS
15.0000 mg | ORAL_TABLET | Freq: Every day | ORAL | 1 refills | Status: DC
  Filled 2023-02-04: qty 30, 30d supply, fill #0

## 2023-02-04 MED ORDER — CLONAZEPAM 0.5 MG PO TABS
0.5000 mg | ORAL_TABLET | Freq: Three times a day (TID) | ORAL | 1 refills | Status: AC | PRN
  Filled 2023-02-04: qty 90, 30d supply, fill #0

## 2023-02-04 NOTE — Progress Notes (Signed)
[facility] MD Virtual Progress Note   Patient Location: Work Provider Location: Home Office  I connect with patient by video and verified that I am speaking with correct person by using two identifiers. I discussed the limitations of evaluation and management by telemedicine and the availability of in person appointments. I also discussed with the patient that there may be a patient responsible charge related to this service. The patient expressed understanding and agreed to proceed.  Crystal Diaz [id number] 33 y.o.  02/04/2023 9:33 AM  History of Present Illness:  Patient is evaluated by video session.  She is at work.  She reported Christmas was quiet.  Son was watching and playing game most of the time.  Law school started son started to have some issues at school but so far no major problem.  Patient reported sleeping is not as good and she continues to have racing thoughts, depressive and anxiety.  She is taking Abilify 10 mg which she believes helped some but is still residual irritability, anxiety and depression.  Admitted sometime fleeting and negative thoughts but no suicidal thoughts or paranoia.  She started therapy with Olegario Messier and now she is thinking to start IOP.  We have this discussion before but now she feels she can start and had already initial assessment.  She has no tremors, shakes or any EPS.  Her job is okay.  She admitted not able to go back to gym but planned to start again very soon.  However she had lost weight since the last visit.  She denies drinking or using any illegal substances.  Patient told her son psychiatrist did not approve but Abilify injection.  Her son see Dr. Jerold Coombe.  Past Psychiatric History: Denies any history of suicidal attempt, inpatient treatment, psychosis, hallucination, PTSD. Tried Zoloft, Effexor and Lexapro from PCP but they were ineffective. Lamictal helped but caused rash.    Outpatient Encounter Medications as of 02/04/2023   Medication Sig   lidocaine (XYLOCAINE) 5 % ointment Apply 1 Application topically as needed.   ARIPiprazole (ABILIFY) 10 MG tablet Take 1 tablet (10 mg total) by mouth daily.   clonazePAM (KLONOPIN) 0.5 MG tablet Take 1 tablet (0.5 mg total) by mouth 3 (three) times daily as needed.   cyanocobalamin (VITAMIN B12) 1000 MCG/ML injection Inject 1 mL (1,000 mcg total) into the muscle every 30 (thirty) days.   desogestrel-ethinyl estradiol (MIRCETTE) 0.15-0.02/0.01 MG (21/5) tablet Take 1 tablet by mouth daily.   ketoconazole (NIZORAL) 2 % cream Apply 1 Application topically 2 (two) times daily.   medroxyPROGESTERone (PROVERA) 10 MG tablet Take 1 tablet (10 mg total) by mouth daily.   pantoprazole (PROTONIX) 40 MG tablet Take 1 tablet (40 mg total) by mouth once daily   pregabalin (LYRICA) 150 MG capsule Take 1 capsule (150 mg total) by mouth 2 (two) times daily.   Prenatal Vit-Fe Fumarate-FA (PRENATAL VITAMIN) 27-0.8 MG TABS Take 1 tablet by mouth daily.   SYRINGE-NEEDLE, DISP, 3 ML (B-D 3CC LUER-LOK SYR 23GX1") 23G X 1" 3 ML MISC Use once monthly with vitamin B12 as directed   Vitamin D, Ergocalciferol, (DRISDOL) 1.25 MG (50000 UNIT) CAPS capsule Take 1 capsule (50,000 Units total) by mouth once a week for 12 weeks   No facility-administered encounter medications on file as of 02/04/2023.    No results found for this or any previous visit (from the past 2160 hours).   Psychiatric Specialty Exam: Physical Exam  Review of Systems  There were no  vitals taken for this visit.There is no height or weight on file to calculate BMI.  General Appearance: Casual  Eye Contact:  Fair  Speech:  Slow  Volume:  Decreased  Mood:  Anxious and Dysphoric  Affect:  Congruent  Thought Process:  Goal Directed  Orientation:  Full (Time, Place, and Person)  Thought Content:  Rumination  Suicidal Thoughts:  No  Homicidal Thoughts:  No  Memory:  Immediate;   Good Recent;   Good Remote;   Good  Judgement:   Intact  Insight:  Present  Psychomotor Activity:  Decreased  Concentration:  Concentration: Fair and Attention Span: Fair  Recall:  Good  Fund of Knowledge:  Good  Language:  Good  Akathisia:  No  Handed:  Right  AIMS (if indicated):     Assets:  Communication Skills Desire for Improvement Financial Resources/Insurance Housing Talents/Skills Transportation  ADL's:  Intact  Cognition:  WNL  Sleep:  fair     Assessment/Plan: MDD (major depressive disorder), recurrent episode, moderate (HCC) - Plan: ARIPiprazole (ABILIFY) 15 MG tablet, clonazePAM (KLONOPIN) 0.5 MG tablet  Anxiety - Plan: ARIPiprazole (ABILIFY) 15 MG tablet, clonazePAM (KLONOPIN) 0.5 MG tablet  Trauma and stressor-related disorder  Patient continues to have anxiety, depressive thoughts and not sleeping very well.  She has negative thoughts about herself but denies any suicidal thoughts.  She has a assessment for IOP and she like to start which I have recommended but recently her new therapist reinforced to consider.  We discussed about current medication.  She is taking Lyrica for chronic pain.  I recommend to try Abilify 15 mg since she has no issues or concern from current medication.  Reminded to call us back if she notices any side effects or worsening of symptoms.  Discussed in the beginning may notice tremors or shakes.  Continue Klonopin 0.5 mg 3 times a day.  Encourage walking, exercise.  Patient considering going back to gym.  Will follow up in 6 weeks when she finished IOP.   Follow Up Instructions:     I discussed the assessment and treatment plan with the patient. The patient was provided an opportunity to ask questions and all were answered. The patient agreed with the plan and demonstrated an understanding of the instructions.   The patient was advised to call back or seek an in-person evaluation if the symptoms worsen or if the condition fails to improve as anticipated.    Collaboration of Care:  Other provider involved in patient's care AEB notes are available in epic to review  Patient/Guardian was advised Release of Information must be obtained prior to any record release in order to collaborate their care with an outside provider. Patient/Guardian was advised if they have not already done so to contact the registration department to sign all necessary forms in order for [place] to release information regarding their care.   Consent: Patient/Guardian gives verbal consent for treatment and assignment of benefits for services provided during this visit. Patient/Guardian expressed understanding and agreed to proceed.     I provided 28 minutes of non face to face time during this encounter.  Note: This document was prepared by [employer] voice dictation technology and any errors that results from this process are unintentional.    Cleotis Nipper, MD 02/04/2023

## 2023-02-04 NOTE — Progress Notes (Signed)
[facility] Counselor/Therapist Progress Note   Patient ID: Crystal Diaz, MRN: [id number],     Date: 02/03/2023   Time Spent: 3 minutes 105- 108pm   Treatment Type: Individual Therapy   Risk Assessment: Danger to Self:  No Self-injurious Behavior: No Danger to Others: No   Subjective: This session was held via video teletherapy. The patient consented to video teletherapy and was located in her office during this session. She is aware it is the responsibility of the patient to secure confidentiality on her end of the session. The provider was in a private home office for the duration of this session.    The patient arrived on time for her appointment.    Issues addressed: 1-Pt reports she is at work and has no privacy 2-pt just had appt with Dr. Lolly Mustache 3-slated to enter MH-IOP on Monday and reports she feels she is good until that time and does not need to meet.   Diagnosis:MDD (major depressive disorder), recurrent episode, moderate (HCC)   Anxiety disorder, unspecified type   Plan:  -next session will occur after patient is discharged from MH-IOP -tx plan will be completed at that time given that pt was in crisis while attending this appointment and treatment plan was referral to IOP -pt admitted to MH-IOP and will resume individual therapy upon discharge from MH-IOP.

## 2023-02-06 ENCOUNTER — Other Ambulatory Visit: Payer: Self-pay

## 2023-02-09 ENCOUNTER — Encounter (HOSPITAL_COMMUNITY): Payer: Self-pay

## 2023-02-09 ENCOUNTER — Encounter (HOSPITAL_COMMUNITY): Payer: Self-pay | Admitting: Psychiatry

## 2023-02-09 ENCOUNTER — Other Ambulatory Visit (HOSPITAL_COMMUNITY): Payer: 59 | Admitting: Psychiatry

## 2023-02-09 ENCOUNTER — Other Ambulatory Visit: Payer: Self-pay

## 2023-02-09 ENCOUNTER — Ambulatory Visit: Payer: 59 | Admitting: Professional

## 2023-02-09 DIAGNOSIS — F331 Major depressive disorder, recurrent, moderate: Secondary | ICD-10-CM | POA: Diagnosis present

## 2023-02-09 DIAGNOSIS — F332 Major depressive disorder, recurrent severe without psychotic features: Secondary | ICD-10-CM | POA: Diagnosis not present

## 2023-02-09 DIAGNOSIS — F419 Anxiety disorder, unspecified: Secondary | ICD-10-CM

## 2023-02-09 DIAGNOSIS — G8929 Other chronic pain: Secondary | ICD-10-CM | POA: Diagnosis not present

## 2023-02-09 DIAGNOSIS — Z658 Other specified problems related to psychosocial circumstances: Secondary | ICD-10-CM | POA: Diagnosis not present

## 2023-02-09 DIAGNOSIS — Z79899 Other long term (current) drug therapy: Secondary | ICD-10-CM | POA: Diagnosis not present

## 2023-02-09 DIAGNOSIS — R45851 Suicidal ideations: Secondary | ICD-10-CM | POA: Diagnosis not present

## 2023-02-09 DIAGNOSIS — M4727 Other spondylosis with radiculopathy, lumbosacral region: Secondary | ICD-10-CM | POA: Diagnosis not present

## 2023-02-09 MED ORDER — ARIPIPRAZOLE 15 MG PO TABS: 7.5000 mg | ORAL_TABLET | Freq: Every day | ORAL | Status: DC

## 2023-02-09 MED ORDER — FLUOXETINE HCL 10 MG PO CAPS
ORAL_CAPSULE | ORAL | 0 refills | Status: DC
  Filled 2023-02-09: qty 63, 35d supply, fill #0

## 2023-02-09 MED ORDER — PROPRANOLOL HCL 10 MG PO TABS
10.0000 mg | ORAL_TABLET | Freq: Two times a day (BID) | ORAL | 0 refills | Status: DC | PRN
  Filled 2023-02-09: qty 50, 25d supply, fill #0
  Filled 2023-02-09: qty 10, 5d supply, fill #0
  Filled 2023-02-09: qty 50, 25d supply, fill #0
  Filled 2023-02-09: qty 10, 5d supply, fill #0

## 2023-02-09 NOTE — Progress Notes (Signed)
Psychiatric Initial IOP Assessment  Patient Identification: Crystal Diaz MRN:  [id number] Date of Evaluation:  02/09/2023 Referral Source: Kathryne Sharper, MD  Assessment:  Crystal Diaz is a 33 y.o. 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  Patient reports worsening depressive symptoms in the setting of many psychosocial stressors which are especially noticeable starting November of last year.  She has recently felt she needed a higher level of care as she has been struggling with severe anhedonia, passive suicidal ideation, and severe depression.. She sees Dr. Lolly Mustache for outpatient psychiatry and is currently on abilify 15 mg.  Given she is not on a classic SSRI/SNRI, we have started her on Prozac given former trial of sertraline and lexapro although she notes inconsistency when prescribed by PCP.  Other considerations would be a SNRI given history of chronic pain.  She has noticed significant physiological symptoms when she is in social settings which may be concerning for either generalized anxiety disorder social anxiety disorder.  She has been taking clonazepam almost daily in the past few months and there is concern for possible physiological dependence given severe worsening anxiety after not taking it for 1 to 2 days.  I advised her to slowly taper off medication. She was started on propranolol to better address her social anxiety.  Also decreased her Abilify by half given no clear indication for such a high dose of (no history of bipolar disorder or psychosis) but can be used as an adjunct for her primary antidepressant to address her MDD.  She has a therapist that she started to see on the outpatient side which she has found beneficial and plans to continue to follow-up with her following discharge from IOP.    Plan:  # Major  depressive disorder, recurrent episode, severe without psychotic features Past medication trials:  Status of problem: Active Interventions: -- Start fluoxetine 10 mg for 7 days then increase to 20 mg --Decrease Abilify to 7.5 mg daily --Continue IOP  # Generalized anxiety disorder vs social anxiety disorder Past medication trials:  Status of problem: Active Interventions: -- Duloxetine as above --Start propranolol 10 mg twice daily as needed  -r/b/se discussed with patient and patient was amenable to medication trial --Decrease clonazepam 0.5 mg to daily prn    Patient was given contact information for behavioral health clinic and was instructed to call 911 for emergencies.     Subjective:  Chief Complaint: Medication Management  History of Present Illness:   Patient presents to intensive outpatient due to worsening depressive symptoms as well as anxiety.  She reports having severe anhedonia, depressed mood, poor energy, poor concentration, poor sleep poor appetite, and passive suicidal ideation.  She denies any active intent or plan.  She reports multiple psychosocial stressors that have been ongoing citing multiple losses of family members between the time of 2013-20 23, ending engagement with fianc due to his infidelity, 8 year old son acting out since grandmother passed in 2018, and younger brother with schizophrenia currently incarcerated for first-degree murder.  She denies HI/AVH.  She reports having been on Abilify for some time now which was primarily to address her depression.  She had previously been on Lexapro and Zoloft but was inconsistent with taking them.  She denies history of mania or psychosis.  She does report significant anxiety especially at work.  She feels that he will easily become  overwhelmed and has significant physiological symptoms including sweating, tremors, tachycardia, blurred vision.  He noticed that this was especially apparent when she attempted to  go to the store yesterday and had a panic attack in the car.  She did note that she had not taken her clonazepam for 1 to 2 days.  She states that she normally takes the clonazepam approximately twice when she is working and once when she is not recently given the acute worsening depression and worsening anxiety.  She denies significant substance use history.  She does endorse sporadic alcohol use socially approximately once every other month month.   Past Psychiatric History:  Diagnoses: MDD, GAD Medication trials: Effexor (noncompliant), sertraline (noncompliant), Lexapro (noncompliant) Previous psychiatrist/therapist: Dr. Lolly Mustache Hospitalizations: denies Suicide attempts: denies SIB: denies   Previous Psychotropic Medications: Yes   Substance Abuse History in the last 12 months:  No.  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   Procedure: LAPAROSCOPIC ROUX-EN-Y GASTRIC BYPASS WITH HIATAL HERNIA REPAIR AND UPPER ENDOSCOPY;  Surgeon: Luretha Murphy, MD;  Location: [facility];  Service: General;  Laterality: N/A;   WISDOM TOOTH EXTRACTION      Family Psychiatric History:   Family History:  Family History  Problem Relation Age of Onset   Diabetes Mother    Pancreatic cancer Father 93   Cancer Brother        Oral   Mental illness Brother     Social History:    Social History   Socioeconomic History   Marital status: Single    Spouse name: Not on file   Number of children: Not on file   Years of education: Not on file   Highest education level: Not on file  Occupational History   Not on file  Tobacco Use   Smoking status: Former     Current packs/day: 0.00    Average packs/day: 0.3 packs/day for 4.0 years (1.0 ttl pk-yrs)    Types: Cigarettes    Start date: 01/19/2013    Quit date: 01/19/2017    Years since quitting: 6.0   Smokeless tobacco: Never  Vaping Use   Vaping status: Never Used  Substance and Sexual Activity   Alcohol use: Yes    Alcohol/week: 0.0 standard drinks of alcohol    Comment: occ   Drug use: No   Sexual activity: Yes    Partners: Male    Birth control/protection: Pill  Other Topics Concern   Not on file  Social History Narrative   Single. In a relationship.    One son.   Works as Geographical information systems officer.    Enjoys spending time with her son, traveling.   Social Drivers of Corporate investment banker Strain: Not on file  Food Insecurity: Not on file  Transportation Needs: Not on file  Physical Activity: Not on file  Stress: Not on file  Social Connections: Not on file    Additional Social History: updated  Allergies:  Not on File  Current Medications: Current Outpatient Medications  Medication Sig Dispense Refill   lidocaine (XYLOCAINE) 5 % ointment Apply 1 Application topically  as needed. 35.44 g 0   ARIPiprazole (ABILIFY) 15 MG tablet Take 1 tablet (15 mg total) by mouth daily. 30 tablet 1   clonazePAM (KLONOPIN) 0.5 MG tablet Take 1 tablet (0.5 mg total) by mouth 3 (three) times daily as needed. 90 tablet 1   cyanocobalamin (VITAMIN B12) 1000 MCG/ML injection Inject 1 mL (1,000 mcg total) into the muscle every 30 (thirty) days. 3 mL 1   desogestrel-ethinyl estradiol (MIRCETTE) 0.15-0.02/0.01 MG (21/5) tablet Take 1 tablet by mouth daily. 84 tablet 3   ketoconazole (NIZORAL) 2 % cream Apply 1 Application topically 2 (two) times daily. 60 g 0   medroxyPROGESTERone (PROVERA) 10 MG tablet Take 1 tablet (10 mg total) by mouth daily. 10 tablet 1   pantoprazole (PROTONIX) 40 MG tablet Take 1 tablet (40 mg total) by mouth once daily 90 tablet 3   pregabalin (LYRICA) 150 MG capsule Take 1  capsule (150 mg total) by mouth 2 (two) times daily. 60 capsule 2   Prenatal Vit-Fe Fumarate-FA (PRENATAL VITAMIN) 27-0.8 MG TABS Take 1 tablet by mouth daily. 30 tablet 11   SYRINGE-NEEDLE, DISP, 3 ML (B-D 3CC LUER-LOK SYR 23GX1") 23G X 1" 3 ML MISC Use once monthly with vitamin B12 as directed 6 each 0   Vitamin D, Ergocalciferol, (DRISDOL) 1.25 MG (50000 UNIT) CAPS capsule Take 1 capsule (50,000 Units total) by mouth once a week for 12 weeks 12 capsule 0   No current facility-administered medications for this visit.    ROS: Review of Systems   Objective:  Psychiatric Specialty Exam: There were no vitals taken for this visit.There is no height or weight on file to calculate BMI.  General Appearance: Casual  Eye Contact:  Fair  Speech:  Clear and Coherent and Normal Rate  Volume:  Normal  Mood:  Anxious and Depressed  Affect:  Flat  Thought Content: Logical   Suicidal Thoughts:  No  Homicidal Thoughts:  No  Thought Process:  Coherent, Goal Directed, and Linear  Orientation:  Full (Time, Place, and Person)    Memory: Remote;   Fair  Judgment:  Intact  Insight:  Fair  Concentration:  Concentration: Fair  Recall:  not formally assessed   Fund of Knowledge: Fair  Language: Fair  Psychomotor Activity:  Normal  Akathisia:  No  AIMS (if indicated): not done  Assets:  Communication Skills Desire for Improvement Financial Resources/Insurance Housing Leisure Time Physical Health Resilience Social Support Talents/Skills Transportation Vocational/Educational  ADL's:  Intact  Cognition: WNL  Sleep:  Poor   PE: General: sits comfortably in view of camera; no acute distress  Pulm: no increased work of breathing on room air  MSK: all extremity movements appear intact  Neuro: no focal neurological deficits observed  Gait & Station: unable to assess by video    Metabolic Disorder Labs: No results found for: "HGBA1C", "MPG" No results found for: "PROLACTIN" Lab Results   Component Value Date   CHOL 126 07/16/2022   TRIG 47.0 07/16/2022   HDL 51.00 07/16/2022   CHOLHDL 2 07/16/2022   VLDL 9.4 07/16/2022   LDLCALC 66 07/16/2022   LDLCALC 62 09/06/2021   Lab Results  Component Value Date   TSH 1.08 09/06/2021    Therapeutic Level Labs: No results found for: "LITHIUM" No results found for: "CBMZ" No results found for: "VALPROATE"  Screenings:  GAD-7    Flowsheet Row Office Visit from 02/18/2022 in [facility] [place] Office Visit from 04/26/2021 in [facility] Visit from 02/06/2020 in [facility] at [facility] Office Visit from 10/27/2019 in [facility] at [facility] Office Visit from 11/25/2017 in [facility] at [facility]  Total GAD-7 Score 14 19 8 18 17       PHQ2-9    Flowsheet Row Counselor from 01/28/2023 in BE[facility]AV[facility]AL HEALTH INTENSIVE [facility] Counselor from 01/26/2023 in [facility] Medicine at [facility] Nutrition from 01/12/2023 in [facility] Nutrition & Diabetes Education Services at [facility] Visit from 02/18/2022 in [facility] [place] Counselor from 10/21/2021 in BEHAVIORAL HEALTH INTENSIVE [facility]  PHQ-2 Total Score 6 5 0 4 5  PHQ-9 Total Score 23 16 -- 15 20      Flowsheet Row Counselor from 01/28/2023 in BEHAVIORAL HEALTH INTENSIVE [facility] ED from 01/07/2022 in [facility] Emergency Department at [facility] ED from 01/06/2022 in [facility] Emergency Department at [facility]  C-SSRS RISK CATEGORY Error: Q3, 4, or 5 should not be populated when Q2 is No No Risk No Risk       Collaboration of Care: Collaboration of Care:   Patient/Guardian was advised Release of Information must be obtained prior to any record release in order to collaborate their care with an outside provider. Patient/Guardian was advised if they have not  already done so to contact the registration department to sign all necessary forms in order for [place] to release information regarding their care.   Consent: Patient/Guardian gives verbal consent for treatment and assignment of benefits for services provided during this visit. Patient/Guardian expressed understanding and agreed to proceed.   Televisit via video: I connected with [name] on 02/09/23 at  9:00 AM EST by a video enabled telemedicine application and verified that I am speaking with the correct person using two identifiers.  Location: Patient: home Provider: office   I discussed the limitations of evaluation and management by telemedicine and the availability of in person appointments. The patient expressed understanding and agreed to proceed.  I discussed the assessment and treatment plan with the patient. The patient was provided an opportunity to ask questions and all were answered. The patient agreed with the plan and demonstrated an understanding of the instructions.   The patient was advised to call back or seek an in-person evaluation if the symptoms worsen or if the condition fails to improve as anticipated.  Park Pope, MD 1/27/202510:00 AM

## 2023-02-09 NOTE — Progress Notes (Signed)
Virtual Visit via Video Note   I connected with [name] on 02/09/23 at  9:00 AM EDT by a video enabled telemedicine application and verified that I am speaking with the correct person using two identifiers.   At orientation to the IOP program, Case Manager discussed the limitations of evaluation and management by telemedicine and the availability of in person appointments. The patient expressed understanding and agreed to proceed with virtual visits throughout the duration of the program.   Location:  Patient: Patient Home Provider: [facility]   History of Present Illness: MDD   Observations/Objective: Check In: Case Manager checked in with all participants to review discharge dates, insurance authorizations, work-related documents and needs from the treatment team regarding medications. Marnisha stated needs and engaged in discussion.    Initial Therapeutic Activity: Counselor facilitated a check-in with Tamberlyn to assess for safety, sobriety and medication compliance.  Counselor also inquired about Cianna's current emotional ratings, as well as any significant changes in thoughts, feelings or behavior since previous check in.  Illyria presented for session on time and was alert, oriented x5, with no evidence or self-report of active HI or A/V H.  Zyara reported that she has been experiencing passive SI without intent or plan most days.  She reported that she could contract for safety at this time, agreeing to call 911 and/or visit the hospital if SI returns with development of intent or plan to harm herself.  Aniesha reported compliance with medication and denied use of alcohol or illicit substances.  Treanna reported scores of 8/10 for depression, 7/10 for anxiety, and 6/10 for irritability.  Taydem denied any recent outbursts or panic attacks.  Emony reported that a struggle has been dealing with grief and depression, which led her to seek group therapy to cope better and build  support.  Frank reported that her goal is to get out of the house and go get some groceries.   Second Therapeutic Activity: Counselor discussed topic of sleep hygiene today with group.  Counselor defined this as the habits, behaviors and environmental factors that can be adjusted to improve overall sleep quality.  Counselor also discussed how lack of consistent sleep can negatively affect mood and overall mental health.  Counselor provided members with a screening to complete assessing typical barriers one might face in achieving quality sleep at night (i.e. struggling to get up in the morning, waking up throughout the night, tossing/turning, etc), and inquired about members' perception of sleep hygiene at present.  Counselor offered techniques to members via a virtual handout which could be implemented in order to improve sleep hygiene, such as sticking to a normal morning/night routine, avoiding using of electronics too close to bedtime, avoiding heavy meals before bed, using a sleep journal to track changes and address anxious thoughts, as well as avoiding naps during the daytime, ensuring proper use of medications if prescribed any by provider(s), and more.  Counselor inquired about changes members intend to make to sleep hygiene based upon information covered today.  Interventions were effective, as evidenced by Raquel Sarna actively participating in discussion on subject, reporting that she has only been getting about 5 hours per night of sleep for the last 4-5 years.  Richel also completed a sleep assessment, which revealed that she is sleep deprived at present.  Sonda reported that she plans to improve sleep hygiene over the course of treatment by following a more consistent sleep/wake cycle each day to develop a routine, avoiding use of electronics  too close to bedtime, getting out of the bedroom more often during the daytime, cutting back on naps during the daytime to ensure she is tired at night, taking  a warm bath at bedtime, avoiding clock watching during the night, and getting more daily exercise to ensure she is tired at night.  Assessment and Plan: Counselor recommends that Lugene remain in IOP treatment to better manage mental health symptoms, ensure stability and pursue completion of treatment plan goals. Counselor recommends adherence to crisis/safety plan, taking medications as prescribed, and following up with medical professionals if any issues arise.    Follow Up Instructions: Counselor will send Webex link for session tomorrow.  Darlen was advised to call back or seek an in-person evaluation if the symptoms worsen or if the condition fails to improve as anticipated.   Collaboration of Care:   Medication Management AEB Hillery Jacks, NP or Dr. Park Pope                                          Case Manager AEB Jeri Modena, CNA    Patient/Guardian was advised Release of Information must be obtained prior to any record release in order to collaborate their care with an outside provider. Patient/Guardian was advised if they have not already done so to contact the registration department to sign all necessary forms in order for [place] to release information regarding their care.    Consent: Patient/Guardian gives verbal consent for treatment and assignment of benefits for services provided during this visit. Patient/Guardian expressed understanding and agreed to proceed.   I provided 180 minutes of non-face-to-face time during this encounter.   Noralee Stain, [place], LCAS 02/09/23

## 2023-02-10 ENCOUNTER — Other Ambulatory Visit (HOSPITAL_COMMUNITY): Payer: 59 | Admitting: Psychiatry

## 2023-02-10 DIAGNOSIS — Z79899 Other long term (current) drug therapy: Secondary | ICD-10-CM | POA: Diagnosis not present

## 2023-02-10 DIAGNOSIS — G8929 Other chronic pain: Secondary | ICD-10-CM | POA: Diagnosis not present

## 2023-02-10 DIAGNOSIS — Z658 Other specified problems related to psychosocial circumstances: Secondary | ICD-10-CM | POA: Diagnosis not present

## 2023-02-10 DIAGNOSIS — F331 Major depressive disorder, recurrent, moderate: Secondary | ICD-10-CM | POA: Diagnosis not present

## 2023-02-10 DIAGNOSIS — F419 Anxiety disorder, unspecified: Secondary | ICD-10-CM | POA: Diagnosis not present

## 2023-02-10 DIAGNOSIS — M4727 Other spondylosis with radiculopathy, lumbosacral region: Secondary | ICD-10-CM | POA: Diagnosis not present

## 2023-02-10 DIAGNOSIS — R45851 Suicidal ideations: Secondary | ICD-10-CM | POA: Diagnosis not present

## 2023-02-10 DIAGNOSIS — F332 Major depressive disorder, recurrent severe without psychotic features: Secondary | ICD-10-CM | POA: Diagnosis not present

## 2023-02-10 NOTE — Progress Notes (Signed)
Virtual Visit via Video Note   I connected with [name] on 02/10/23 at  9:00 AM EDT by a video enabled telemedicine application and verified that I am speaking with the correct person using two identifiers.   At orientation to the IOP program, Case Manager discussed the limitations of evaluation and management by telemedicine and the availability of in person appointments. The patient expressed understanding and agreed to proceed with virtual visits throughout the duration of the program.   Location:  Patient: Patient Home Provider: [facility]   History of Present Illness: MDD   Observations/Objective: Check In: Case Manager checked in with all participants to review discharge dates, insurance authorizations, work-related documents and needs from the treatment team regarding medications. Karyss stated needs and engaged in discussion.    Initial Therapeutic Activity: Counselor facilitated a check-in with Rozetta to assess for safety, sobriety and medication compliance.  Counselor also inquired about Wendy's current emotional ratings, as well as any significant changes in thoughts, feelings or behavior since previous check in.  Lareta presented for session on time and was alert, oriented x5, with no evidence or self-report of active SI/HI or A/V H.  Nikie reported compliance with medication and denied use of alcohol or illicit substances.  Telly reported scores of 7/10 for depression, 7/10 for anxiety, and 9/10 for irritability.  Apollonia denied any recent outbursts or panic attacks.  Maddelynn reported that a recent success was going to [employer] to get out of the house yesterday, stating "Everything went great and it was quick".  She reported that she also attended a basketball game for her son in the evening.  Burnie reported that a struggle was dealing with anxiety while driving to the store, although she hopes to work on desensitizing herself to this by getting out more often.   Kayline reported that her goal is to take her son to school and pick him up each day in order to aid in this goal.   Second Therapeutic Activity: Counselor introduced [employer], [place] Chaplain to provide psychoeducation on topic of Grief and Loss with members today.  Marchelle Folks began discussion by checking in with the group about their baseline mood today, general thoughts on what grief means to them and how it has affected them personally in the past.  Marchelle Folks provided information on how the process of grief/loss can differ depending upon one's unique culture, and categories of loss one could experience (i.e. loss of a person, animal, relationship, job, identity, etc).  Marchelle Folks encouraged members to be mindful of how pervasive loss can be, and how to recognize signs which could indicate that this is having an impact on one's overall mental health and wellbeing.  Intervention was effective, as evidenced by [employer] participating in discussion with speaker on the subject, reporting that she is familiar with grief, as she lost several people in her life that she considered important to her.  Kyndell was receptive to empathetic feedback from chaplain on how to process these losses in a healthy manner as she continues with treatment, stating "I like the perspective".      Third Therapeutic Activity: Counselor introduced topic of creating mental health maintenance plan today.  Counselor provided handout on subject to members, which stressed the importance of maintaining one's mental health in a similar way to using diet and exercise to ensure physical health.  Counselor walked members through process of identifying triggers which could worsen symptoms, including specific people, places, and things one needs to  avoid.  Members were also tasked with identifying warning signs such as thoughts, feelings, or behaviors which could indicate mental health is at increased risk.  Counselor also facilitated conversation on  self-care activities and coping strategies which members have previously utilized in the past, are currently using in daily routine, or plan to use soon to assist with managing problems or symptoms when/if they appear.  Counselor encouraged members to revisit their maintenance plan often and make changes as needed to ensure day to day stability.  Intervention was effective, as evidenced by [employer] participating in activity and creating a comprehensive plan, including identification of triggers such as driving on a busy road, interacting with a specific coworker at her job, or hearing about problems in her son's life, and warning signs including passive suicidal thoughts, ruminations on losses, and social isolation.  Tacora also reported that she would make an effort to set aside time for self-care activities such as going to the gym with her son, or taking her dog for a walk, and use coping skills such as taking a timeout to calm down or mental grounding to manage stressors.    Assessment and Plan: Counselor recommends that Kiyona remain in IOP treatment to better manage mental health symptoms, ensure stability and pursue completion of treatment plan goals. Counselor recommends adherence to crisis/safety plan, taking medications as prescribed, and following up with medical professionals if any issues arise.    Follow Up Instructions: Counselor will send Webex link for session tomorrow.  Romy was advised to call back or seek an in-person evaluation if the symptoms worsen or if the condition fails to improve as anticipated.   Collaboration of Care:   Medication Management AEB Hillery Jacks, NP or Dr. Park Pope                                          Case Manager AEB Jeri Modena, CNA    Patient/Guardian was advised Release of Information must be obtained prior to any record release in order to collaborate their care with an outside provider. Patient/Guardian was advised if they have not already done so  to contact the registration department to sign all necessary forms in order for [place] to release information regarding their care.    Consent: Patient/Guardian gives verbal consent for treatment and assignment of benefits for services provided during this visit. Patient/Guardian expressed understanding and agreed to proceed.   I provided 180 minutes of non-face-to-face time during this encounter.   Noralee Stain, [place], LCAS 02/10/23

## 2023-02-11 ENCOUNTER — Other Ambulatory Visit (HOSPITAL_COMMUNITY): Payer: 59 | Admitting: Licensed Clinical Social Worker

## 2023-02-11 DIAGNOSIS — F331 Major depressive disorder, recurrent, moderate: Secondary | ICD-10-CM

## 2023-02-11 DIAGNOSIS — F332 Major depressive disorder, recurrent severe without psychotic features: Secondary | ICD-10-CM | POA: Diagnosis not present

## 2023-02-12 ENCOUNTER — Other Ambulatory Visit (HOSPITAL_BASED_OUTPATIENT_CLINIC_OR_DEPARTMENT_OTHER): Payer: 59 | Admitting: Psychiatry

## 2023-02-12 DIAGNOSIS — F331 Major depressive disorder, recurrent, moderate: Secondary | ICD-10-CM
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
## 2023-02-13 ENCOUNTER — Telehealth (HOSPITAL_COMMUNITY): Payer: Self-pay | Admitting: Psychiatry

## 2023-02-13 ENCOUNTER — Other Ambulatory Visit (HOSPITAL_COMMUNITY): Payer: 59 | Admitting: Psychiatry

## 2023-02-13 DIAGNOSIS — F331 Major depressive disorder, recurrent, moderate: Secondary | ICD-10-CM

## 2023-02-13 DIAGNOSIS — Z79899 Other long term (current) drug therapy: Secondary | ICD-10-CM | POA: Diagnosis not present

## 2023-02-13 DIAGNOSIS — M4727 Other spondylosis with radiculopathy, lumbosacral region: Secondary | ICD-10-CM | POA: Diagnosis not present

## 2023-02-13 DIAGNOSIS — R45851 Suicidal ideations: Secondary | ICD-10-CM | POA: Diagnosis not present

## 2023-02-13 DIAGNOSIS — F332 Major depressive disorder, recurrent severe without psychotic features: Secondary | ICD-10-CM | POA: Diagnosis not present

## 2023-02-13 DIAGNOSIS — F419 Anxiety disorder, unspecified: Secondary | ICD-10-CM | POA: Diagnosis not present

## 2023-02-13 DIAGNOSIS — Z658 Other specified problems related to psychosocial circumstances: Secondary | ICD-10-CM | POA: Diagnosis not present

## 2023-02-13 DIAGNOSIS — G8929 Other chronic pain: Secondary | ICD-10-CM | POA: Diagnosis not present
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
## 2023-02-16 ENCOUNTER — Other Ambulatory Visit (HOSPITAL_COMMUNITY): Payer: 59 | Attending: Psychiatry | Admitting: Psychiatry

## 2023-02-16 ENCOUNTER — Ambulatory Visit: Payer: 59 | Admitting: Professional

## 2023-02-16 ENCOUNTER — Other Ambulatory Visit: Payer: Self-pay

## 2023-02-16 DIAGNOSIS — F419 Anxiety disorder, unspecified: Secondary | ICD-10-CM | POA: Insufficient documentation

## 2023-02-16 DIAGNOSIS — F329 Major depressive disorder, single episode, unspecified: Secondary | ICD-10-CM | POA: Diagnosis present

## 2023-02-16 DIAGNOSIS — F331 Major depressive disorder, recurrent, moderate: Secondary | ICD-10-CM | POA: Insufficient documentation

## 2023-02-16 DIAGNOSIS — Z7389 Other problems related to life management difficulty: Secondary | ICD-10-CM | POA: Diagnosis not present

## 2023-02-16 DIAGNOSIS — R4589 Other symptoms and signs involving emotional state: Secondary | ICD-10-CM | POA: Insufficient documentation

## 2023-02-16 MED ORDER — ARIPIPRAZOLE 15 MG PO TABS
7.5000 mg | ORAL_TABLET | Freq: Every day | ORAL | 0 refills | Status: DC
  Filled 2023-02-16: qty 15, 30d supply, fill #0

## 2023-02-16 MED ORDER — FLUOXETINE HCL 20 MG PO CAPS
20.0000 mg | ORAL_CAPSULE | Freq: Every day | ORAL | 0 refills | Status: DC
  Filled 2023-02-16: qty 30, 30d supply, fill #0

## 2023-02-16 NOTE — Progress Notes (Signed)
Virtual Visit via Video Note   I connected with [name] on 02/16/23 at  9:00 AM EDT by a video enabled telemedicine application and verified that I am speaking with the correct person using two identifiers.   At orientation to the IOP program, Case Manager discussed the limitations of evaluation and management by telemedicine and the availability of in person appointments. The patient expressed understanding and agreed to proceed with virtual visits throughout the duration of the program.   Location:  Patient: Patient Home Provider: OPT BH Office   History of Present Illness: MDD   Observations/Objective: Check In: Case Manager checked in with all participants to review discharge dates, insurance authorizations, work-related documents and needs from the treatment team regarding medications. Tashiana stated needs and engaged in discussion.    Initial Therapeutic Activity: Counselor facilitated a check-in with Rheya to assess for safety, sobriety and medication compliance.  Counselor also inquired about Zetta's current emotional ratings, as well as any significant changes in thoughts, feelings or behavior since previous check in.  Jelesa presented for session on time and was alert, oriented x5, with no evidence or self-report of active SI/HI or A/V H.  Tabbatha reported compliance with medication and denied use of alcohol or illicit substances.  Desmond reported scores of 6/10 for depression, 7/10 for anxiety, and 8/10 for irritability.  Latoi denied any recent panic attacks.  Catlyn reported that a recent success was going to church over the weekend.  Deandria reported that a struggle was having an outburst on Sunday night when she was feeling overwhelmed and missed her family.  Solita reported that her goal is to run some errands after group.   Second Therapeutic Activity: Counselor invited members to participate in peaceful place guided imagery activity today.  Counselor explained how  this is a powerful visualization tool which can aid in reducing stress while increasing sense of calm, control, and awareness if practiced regularly.  Counselor informed members beforehand that if they became uncomfortable at any point during activity, they could stop and open their eyes.  Counselor invited members to get comfortable, achieve a relaxing breathing rhythm, close their eyes, and then guided them through process of creating a 'peaceful place' which filled them with safety and calm.  Counselor encouraged members to include sensory details involving vision, sound, touch, smell, and taste which they considered pleasant to enhance experience.  After practicing in session, counselor invited members to share their opinion on the activity, including whether they were able to imagine a specific place, what details stood out to them, and how this made them feel during and after.  Intervention effectiveness could not be measured, as client participated in exercise, but did not report about their overall experience or whether they would try this again on their own.    Assessment and Plan: Kaziah requested to be discharged from [facility] today.  Counselor recommends adherence to crisis/safety plan, taking medications as prescribed, and following up with medical professionals if any issues arise.    Follow Up Instructions: Regina was advised to call back or seek an in-person evaluation if the symptoms worsen or if the condition fails to improve as anticipated.   Collaboration of Care:   Medication Management AEB Hillery Jacks, NP or Dr. Park Pope  Case Manager AEB Jeri Modena, CNA    Patient/Guardian was advised Release of Information must be obtained prior to any record release in order to collaborate their care with an outside provider. Patient/Guardian was advised if they have not already done so to contact the registration department to sign all necessary forms in  order for [place] to release information regarding their care.    Consent: Patient/Guardian gives verbal consent for treatment and assignment of benefits for services provided during this visit. Patient/Guardian expressed understanding and agreed to proceed.   I provided 180 minutes of non-face-to-face time during this encounter.   Noralee Stain, LCSW, LCAS 02/16/23

## 2023-02-16 NOTE — Progress Notes (Signed)
Virtual Visit via Video Note  I connected with Crystal Diaz on @TODAY @ at  9:00 AM EST by a video enabled telemedicine application and verified that I am speaking with the correct person using two identifiers.  Location: Patient: at home Provider: at office   I discussed the limitations of evaluation and management by telemedicine and the availability of in person appointments. The patient expressed understanding and agreed to proceed.  I discussed the assessment and treatment plan with the patient. The patient was provided an opportunity to ask questions and all were answered. The patient agreed with the plan and demonstrated an understanding of the instructions.   The patient was advised to call back or seek an in-person evaluation if the symptoms worsen or if the condition fails to improve as anticipated.  I provided 30 minutes of non-face-to-face time during this encounter.   Chestine Spore, RITA, M.Ed, CNA   Patient ID: Crystal Diaz, female   DOB: [birth year], 34 y.o.   MRN: [id number] D:  This is a 33 yr old, single, employed, [place] American female who was referred per Crystal Diaz, [facility]; treatment for worsening depressive/anxiety symptoms. Pt admits to passive SI (denies a plan or intent). Previously in [facility]OP in Oct. 2023. Continued Stressors: 1) Unresolved grief/loss issues: 2011/02/28 father died d/t stage 4 pancreatic cancer, 02-27-13 MGM passed; 27-Feb-2014 MGF passed; 27-Feb-2017 childhood pet died; 2018-02-27 mother suddenly passed d/t COVID; 10-11-21 26 yr old great aunt passed. According to pt, she lived with pt for awhile. Reports limited support. 2) Relationship issues: Pt was engaged until she ended it. Wouldn't allow fiance to reside in home until he discusses his hx of affairs and he did not. Pt had been with boyfriend for three yrs. 3) 9 yo son is acting out since GM passed. "He states he is being bullied but I suspect he's the bully also." 4) Younger brother: He was dx'd with Schizophrenia in Feb 28, 2015. He is  currently in prison d/t first degree murder. Pt denies a hx of being admitted in a psychiatric hospital; currently on inpt unit for treatment (Schizophrenia). Denies a hx of suicide attempt or gestures. Sees Dr. Lolly Mustache for and [facility]   Pt completed virtual MH-IOP, per her request.  States she doesn't have anymore FMLA time.  Reports feeling "Ok" overall.  "I am maintaining."  On a scale of 1-10 (10 being worst), pt rates her depression at a 6 and anxiety at a 7.  Denies SI/HI or A/V hallucinations.  States that the groups were very helpful.  A:  D/C today.  F/U with Dr. Lolly Mustache on 03-18-23 @ 8:20 a.m.; Crystal Diaz, [facility] on 02/28/23 @ 12 noon and 03-02-23 @ 12 noon.  RTW on 02-17-23; without any restrictions.  Pt is requesting to see her therapist on a weekly basis for future appts.  Redirected pt back to therapist.  Encouraged pt to f/u with support groups through [facility]. Pt was advised of ROI must be obtained prior to any records release in order to collaborate her care with an outside provider.  Pt was advised if she has not already done so to contact the front desk to sign all necessary forms in order for MH-IOP to release info re: her care.  Consent:  Pt gives verbal consent for tx and assignment of benefits for services provided during this telehealth group process.  Pt expressed understanding and agreed to proceed. Collaboration of care:  Collaborate with Hillery Jacks, NP AEB; Dr. Kathryne Sharper AEB;  Dr. Park Pope AEB; Crystal Diaz, [facility] AEB; Noralee Stain, LCSW AEB.    R:  Pt receptive.   Jeri Modena, M.Ed,CNA

## 2023-02-16 NOTE — Progress Notes (Addendum)
Virtual Visit via Video Note  I connected with Crystal Diaz on 02/16/23 at  9:00 AM EST by a video enabled telemedicine application and verified that I am speaking with the correct person using two identifiers.  Location: Patient: Home  Provider: Office   I discussed the limitations of evaluation and management by telemedicine and the availability of in person appointments. The patient expressed understanding and agreed to proceed.   I discussed the assessment and treatment plan with the patient. The patient was provided an opportunity to ask questions and all were answered. The patient agreed with the plan and demonstrated an understanding of the instructions.   The patient was advised to call back or seek an in-person evaluation if the symptoms worsen or if the condition fails to improve as anticipated.   Park Pope, MD  Psych Resident, PGY-3 [facility] Intensive Outpatient Program Psych Discharge Summary  Crystal Diaz [id number]  Admission date: 02/09/23 Discharge date: 02/16/2023  Reason for admission: Crystal Diaz is a 33 y.o. 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   Progress in Program Toward Treatment Goals: Progressing  Progress (rationale): improvement in depression and additional coping skills obtained  Sleep: fair  Appetite: fair  Current meds:  Propranolol 10 mg bid prn Abilify 7.5 mg daily Klonopin 0.5 mg tid prn Fluoxetine 10 mg daily  Med side effects: mild fatigue but uncertain whether secondary to stress or medication  Reported feeling confident about graduating IOP.  Denied active and passive SI, HI, AVH, paranoia. Contracted to safety. Patient is aware of [facility], 988, 911. Denied gun access.   ROS   There were no vitals taken for this visit.   Psychiatric Specialty Exam: General Appearance: Casual, faily groomed  Eye Contact:  Good    Speech:  Clear, coherent, normal rate   Volume:  Normal   Mood:  "ok"  Affect:  Appropriate, congruent, full range  Thought Content: Logical, rumination  Suicidal Thoughts: Denied active and passive SI    Thought Process:  Coherent, goal-directed, linear   Orientation:  A&Ox4   Memory:  Immediate good  Judgment:  Fair   Insight:  Fair  Concentration:  Attention and concentration good   Recall:  Good  Fund of Knowledge: Good  Language: Good, fluent  Psychomotor Activity: grossly normal   Akathisia:  NA   AIMS (if indicated): NA   Assets:  Communication Skills Desire for Improvement Financial Resources/Insurance Housing Leisure Time Physical Health Resilience Social Support  ADL's:  Intact  Cognition: WNL  Sleep:  fair       Discharge Plan: Return to care of outpatient psychiatrist Dr. Lolly Mustache and outpatient therapist Teofilo Pod. Patient was advised to continue IOP until completion date but unfortunately did not have FMLA to cover remainder of day.   Patient/Guardian was advised Release of Information must be obtained prior to any record release in order to collaborate their care with an outside provider. Patient/Guardian was advised if they have not already done so to contact the registration department to sign all necessary forms in order for [place] to release information regarding their care.   Consent: Patient/Guardian gives verbal consent for treatment and assignment of benefits for services provided during this visit. Patient/Guardian expressed understanding and agreed to proceed.   Park Pope, MD Psych Resident, PGY-3 02/16/2023

## 2023-02-16 NOTE — Patient Instructions (Signed)
D:  Patient requesting to complete virtual MH-IOP today.  A:  Discharge today.  Follow up with Dr. Lolly Mustache on 03-18-23 @ 8:20 a.m.; Teofilo Pod, [facility] on 02-23-23 @ 12 noon and 03-02-23 @ 12 noon.  Encourage support groups through [employer] [phone].  Return to work on 02-17-23; without any restrictions.  R:  Patient receptive.

## 2023-02-23 ENCOUNTER — Other Ambulatory Visit (HOSPITAL_COMMUNITY): Payer: 59

## 2023-02-23 ENCOUNTER — Ambulatory Visit: Payer: 59 | Admitting: Professional

## 2023-02-25 NOTE — Progress Notes (Signed)
Virtual Visit via Video Note  I connected with Crystal Diaz on 02/11/23 at  9:00 AM EST by a video enabled telemedicine application and verified that I am speaking with the correct person using two identifiers.  Location: Patient: patient home Provider: clinical home office   I discussed the limitations of evaluation and management by telemedicine and the availability of in person appointments. The patient expressed understanding and agreed to proceed.  I discussed the assessment and treatment plan with the patient. The patient was provided an opportunity to ask questions and all were answered. The patient agreed with the plan and demonstrated an understanding of the instructions.   The patient was advised to call back or seek an in-person evaluation if the symptoms worsen or if the condition fails to improve as anticipated.  Pt was provided 180 minutes of non-face-to-face time during this encounter.   [name], LCSW   Daily Group Progress Note  Program: IOP  Group Time: 9"00 - 10:30  Participation Level: Active  Behavioral Response: Appropriate and Sharing  Type of Therapy:  Group Therapy  Summary of Progress: Clinician led check-in regarding current stressors and situation, and review of patient completed daily inventory. Clinician utilized active listening and empathetic response and validated patient emotions. Clinician facilitated processing group on pertinent issues.?  Patient arrived within time allowed. Patient rates her mood at a 5 on a scale of 1-10 with 10 being best. Pt states she feels "anxious." Pt states she slept 5.5 hours and ate 3x. Pt reports she achieved her goal of washing her car yesterday. Pt states she also was able to do one work stop. Pt shares she is experiencing high stress recently and is having difficulty managing the stress. Pt able to process. Pt engaged in discussion.    Progress Towards Goals: Progressing       Group Time: 10:30-  12:00   Participation Level:  Active   Behavioral Response: Appropriate and Sharing   Type of Therapy: Group Therapy   Summary of Progress: Chaplain K. Claussen led discussion on belief and grounding. Group discussed ways in which grounding in our beliefs can support our recovery. Pt participated and engaged in discussion.   Progress Towards Goals: Progressing   [name], LCSW

## 2023-02-27 ENCOUNTER — Other Ambulatory Visit: Payer: Self-pay

## 2023-03-02 ENCOUNTER — Encounter: Payer: Self-pay | Admitting: Professional

## 2023-03-02 ENCOUNTER — Other Ambulatory Visit (HOSPITAL_COMMUNITY): Payer: 59

## 2023-03-02 ENCOUNTER — Ambulatory Visit: Payer: 59 | Admitting: Professional

## 2023-03-02 DIAGNOSIS — F331 Major depressive disorder, recurrent, moderate: Secondary | ICD-10-CM

## 2023-03-02 DIAGNOSIS — F411 Generalized anxiety disorder: Secondary | ICD-10-CM

## 2023-03-02 DIAGNOSIS — M4727 Other spondylosis with radiculopathy, lumbosacral region: Secondary | ICD-10-CM | POA: Diagnosis not present

## 2023-03-02 NOTE — Progress Notes (Addendum)
 [facility] Counselor/Therapist Progress Note   Patient ID: Crystal Diaz, MRN: [id number],     Date: 03/02/2023   Time Spent: 53 minutes 1202- 1255pm   Treatment Type: Individual Therapy   Risk Assessment: Danger to Self:  No Self-injurious Behavior: No Danger to Others: No   Subjective: This session was held via video teletherapy. The patient consented to video teletherapy and was located in her office during this session. She is aware it is the responsibility of the patient to secure confidentiality on her end of the session. The provider was in a private home office for the duration of this session.    The patient arrived on time for her Caregility appointment.    Issues addressed: 1-MH-IOP -IOP was great and much needed 2-mood -it's okay, I'm not as depressed today -feels anxious after appointment for back due to back -unsure what to do and tired of being in pain because if is part of my depression 3-back issues -surgeon wants to perform surgery -pt could get second opinion-has exhausted all her other options -has been seeing him for two years with injections and PT -grandmother doesn't want her to have due to her age -disk replacement would be in no pain afterward -productivity at work is impacted due to pain 4-problem solving -how to plan for the surgery instead of just closing the door -educated -examples 5-treatment planning -"I wanna be able to manage these manic episodes that I have and the depression I have been going through and the anxiety as well: -"A sense of how to have peace" -"I wanna be able to cope with some of the grief also" -Manage the panic attacks and stuff life that" Treatment Plan Problems: Unipolar Depression, Anxiety, Chronic Pain, Grief / Loss Unresolved Symptoms: Excessive and/or unrealistic worry that is difficult to control occurring more days than not for at least 6 months about a number of events or  activities. Hypervigilance (e.g., feeling constantly on edge, experiencing concentration difficulties, having trouble falling or staying asleep, exhibiting a general state of irritability). Experiences pain beyond the normal healing process (six months or more) that significantly limits physical activities. Uses increased amounts of medications with little, if any, pain relief. Experiences back or neck pain, interstitial cystitis, or diabetic neuropathy. Has decreased or stopped activities such as work, household chores, socializing, exercise, sex, or other pleasurable activities because of pain. Experiences an increase in general physical discomfort (e.g., fatigue, night sweats, insomnia, muscle tension, body aches). Exhibits signs and symptoms of depression. Makes many complaintive, depressive statements like "I can't do what I used to"; "No one understands me"; "Why me?"; "When will this go away?"; "I can't take this pain anymore"; and "I can't go on." Thoughts dominated by loss coupled with poor concentration, tearful spells, and confusion about the future. Serial losses in life (i.e., deaths, divorces, jobs) that led to depression and discouragement. Lack of appetite, weight loss, and/or insomnia as well as other depression signs that occurred since the loss. Strong emotional response of sadness exhibited when losses are discussed. Feelings of guilt that not enough was done for the lost significant other, or an unreasonable belief of having contributed to the death of the significant other. Depressed or irritable mood. Decrease or loss of appetite. Diminished interest in or enjoyment of activities. Psychomotor agitation or retardation. Sleeplessness or hypersomnia. Lack of energy. Social withdrawal. Feelings of hopelessness, worthlessness, or inappropriate guilt. Low self-esteem. Unresolved grief issues. History of chronic or recurrent depression for which the client  has taken  antidepressant medication, been hospitalized, had outpatient treatment, or had a course of electroconvulsive therapy. Goals: Reduce overall frequency, intensity, and duration of the anxiety so that daily functioning is not impaired. Stabilize anxiety level while increasing ability to function on a daily basis. Resolve the core conflict that is the source of anxiety. Enhance ability to effectively cope with the full variety of life's worries and anxieties. Learn and implement coping skills that result in a reduction of anxiety and worry, and improved daily functioning. Acquire and utilize the necessary pain management skills. Find relief from pain and build renewed contentment and joy in performing activities of everyday life. Begin a healthy grieving process around the loss. Develop an awareness of how the avoidance of grieving has affected life and begin the healing process. Complete the process of letting go of the lost significant other. Alleviate depressive symptoms and return to previous level of effective functioning. Recognize, accept, and cope with feelings of depression. Develop healthy thinking patterns and beliefs about self, others, and the world that lead to the alleviation and help prevent the relapse of depression. Develop healthy interpersonal relationships that lead to the alleviation and help prevent the relapse of depression. Appropriately grieve the loss in order to normalize mood and to return to previously adaptive level of functioning. Objectives target date for all objectives is 03/01/2024 Describe situations, thoughts, feelings, and actions associated with anxieties and worries, their impact on functioning, and attempts to resolve them. Verbalize an understanding of the cognitive, physiological, and behavioral components of anxiety and its treatment. Learn and implement calming skills to reduce overall anxiety and manage anxiety symptoms. Verbalize an understanding of the  role that cognitive biases play in excessive irrational worry and persistent anxiety symptoms. Identify, challenge, and replace biased, fearful self-talk with positive, realistic, and empowering self-talk. Learn and implement problem-solving strategies for realistically addressing worries. Identify and engage in pleasant activities on a daily basis. Describe the nature of, history of, impact of, and understood causes of chronic pain. Identify and monitor specific pain triggers. Learn and implement calming skills such as relaxation, biofeedback, or mindfulness meditation to ease pain. Identify, challenge, and change maladaptive thoughts and beliefs about pain and pain management and replace them with more adaptive thoughts and beliefs. Learn and implement specific coping skills as well as when and how to use them to manage pain and its consequences. Engage in positive self-talk as an alternative to the depressing, negative thoughts about self and the world. Tell in detail the story of the current loss that is triggering symptoms. Identify what stages of grief have been experienced in the continuum of the grieving process. Begin verbalizing feelings associated with the loss. Attend a grief/loss support group. Identify how avoiding dealing with loss has negatively impacted life. Decrease unrealistic thoughts, statements, and feelings of being responsible for the loss. Express thoughts and feelings about the deceased that went unexpressed while the deceased was alive. Report decreased time spent each day focusing on the loss. Describe current and past experiences with depression including their impact on functioning and attempts to resolve it. Verbalize an accurate understanding of depression. Identify and replace thoughts and beliefs that support depression. Learn and implement behavioral strategies to overcome depression. Identify important people in life, past and present, and describe the  quality, good and poor, of those relationships. Learn and implement problem-solving and decision-making skills. Learn and implement conflict resolution skills to resolve interpersonal problems. Learn and implement relapse prevention skills. Interventions: Use empathy, compassion,  and support, allowing the client to tell in detail the story of his/her recent loss. Ask the client to elaborate in an autobiography the circumstances, feelings, and effects of the losses in him/her; assess the characteristics of the loss (e.g., type, suddenness, trauma), previous functioning, current functioning, and coping style. Ask the client to list ways that avoidance of grieving has negatively impacted his/her life. Use a cognitive therapy approach to identify the client's bias toward thoughts of personal responsibility for the loss and replace them with factual, reality-based thoughts (or assign "Negative Thoughts Trigger Negative Feelings" in the Adult Psychotherapy Homework Planner by Stephannie Li). Conduct an empty-chair exercise with the client where he/she focuses on expressing to the lost loved one imagined in the chair what he/she never said while that loved one was alive. Assign the client to visit the grave of the lost loved one to "talk to" the deceased and express his/her feelings. Ask the client to write a letter to the lost person describing his/her fond memories and/or painful and regretful memories, and how he/she currently feels life (or assign "Dear _____: A Letter to a Lost Loved One" in the Adult Psychotherapy Homework Planner by [facility]); process the letter in session. Assign the client to write to the deceased loved one with a special focus on his/her feelings associated with the last meaningful contact with that person. Suggest that the client set aside a specific time-limited period each day to focus on mourning his/her loss. After each day's time is up, the client will resume regular activities and  postpone grieving thoughts until the next scheduled time. For example, mourning times could include putting on dark clothing and/or sad music; clothing would be changed when the allotted time is up. Educate the client on the stages of the grieving process and answer any questions he/she may have. Assist the client in identifying the stages of grief that he/she has experienced and which stage he/she is presently working through. Ask the client to bring pictures or mementos connected with his/her loss to a session and talk about them (or assign "Creating a [place] [employer]" in the Adult Psychotherapy Homework Planner by Stephannie Li). Assist the client in identifying and expressing feelings connected with his/her loss. Ask the client to attend a grief/loss support group and report to the therapist how he/she felt about attending. Ask the client to describe his/her past experiences of anxiety and their impact on functioning; assess the focus, excessiveness, and uncontrollability of the worry and the type, frequency, intensity, and duration of his/her anxiety symptoms (consider using a structured interview such as The Anxiety Disorders Interview Schedule-Adult Version). Explore the client's schema and self-talk that mediate his/her fear response; assist him/her in challenging the biases; replace the distorted messages with reality-based alternatives and positive, realistic self-talk that will increase his/her self-confidence in coping with irrational fears (see Cognitive Therapy of Anxiety Disorders by Laurence Slate). Teach the client problem-solving strategies involving specifically defining a problem, generating options for addressing it, evaluating the pros and cons of each option, selecting and implementing an optional action, and reevaluating and refining the action (or assign "Applying Problem-Solving to Interpersonal Conflict" in the Adult Psychotherapy Homework Planner by Stephannie Li). Engage the client in  behavioral activation, increasing the client's contact with sources of reward, identifying processes that inhibit activation, and teaching skills to solve life problems (or assign "Identify and Schedule Pleasant Activities" in the Adult Psychotherapy Homework Planner by Stephannie Li); use behavioral techniques such as instruction, rehearsal, role-playing, role reversal as needed to assist  adoption into the client's daily life; reinforce success. Discuss how generalized anxiety typically involves excessive worry about unrealistic threats, various bodily expressions of tension, overarousal, and hypervigilance, and avoidance of what is threatening that interact to maintain the problem (see Mastery of Your Anxiety and Worry: Therapist Guide by Renard Matter, and [place]; Treating Generalized Anxiety Disorder by Rygh and Ida Rogue). Discuss how treatment targets worry, anxiety symptoms, and avoidance to help the client manage worry effectively, reduce overarousal, and eliminate unnecessary avoidance. Teach the client calming/relaxation skills (e.g., applied relaxation, progressive muscle relaxation, cue controlled relaxation; mindful breathing; biofeedback) and how to discriminate better between relaxation and tension; teach the client how to apply these skills to his/her daily life (e.g., New Directions in Progressive Muscle Relaxation by Marcelyn Ditty, and Hazlett-Stevens; Treating Generalized Anxiety Disorder by Rygh and Ida Rogue). Assign the client to read about progressive muscle relaxation and other calming strategies in relevant books or treatment manuals (e.g., Progressive Relaxation Training by Robb Matar and Alen Blew; Mastery of Your Anxiety and Worry: Workbook by Earlie Counts). Assist the client in analyzing his/her worries by examining potential biases such as the probability of the negative expectation occurring, the real consequences of it occurring, his/her ability to control the outcome, the  worst possible outcome, and his/her ability to accept it (see "Analyze the Probability of a Feared Event" in the Adult Psychotherapy Homework Planner by Stephannie Li; Cognitive Therapy of Anxiety Disorders by Laurence Slate). Discuss examples demonstrating that unrealistic worry typically overestimates the probability of threats and underestimates or overlooks the client's ability to manage realistic demands (or assign "Past Successful Anxiety Coping" in the Adult Psychotherapy Homework Planner by [facility]). Encourage the client to share his/her thoughts and feelings of depression; express empathy and build rapport while identifying primary cognitive, behavioral, interpersonal, or other contributors to depression. Assign the client to self-monitor thoughts, feelings, and actions in daily journal (e.g., "Negative Thoughts Trigger Negative Feelings" in the Adult Psychotherapy Homework Planner by Stephannie Li; "Daily Record of Dysfunctional Thoughts" in Cognitive Therapy of Depression by Ashby Dawes and Shea Evans); process the journal material to challenge depressive thinking patterns and replace them with reality-based thoughts. Facilitate and reinforce the client's shift from biased depressive self-talk and beliefs to reality-based cognitive messages that enhance self-confidence and increase adaptive actions (see "Positive Self-Talk" in the Adult Psychotherapy Homework Planner by Stephannie Li). Engage the client in "behavioral activation," increasing his/her activity level and contact with sources of reward, while identifying processes that inhibit activation (see Behavioral Activation for Depression by Katharine Look, Dimidjian, and Herman-Dunn; or assign "Identify and Schedule Pleasant Activities" in the Adult Psychotherapy Homework Planner by [facility]); use behavioral techniques such as instruction, rehearsal, role-playing, role reversal, as needed, to facilitate activity in the client's daily life; reinforce success. Conduct  Interpersonal Therapy (see Interpersonal Psychotherapy of Depression by Casimer Lanius al.), beginning with the assessment of the client's "interpersonal inventory" of important past and present relationships; develop a case formulation linking depression to grief, interpersonal role disputes, role transitions, and/or interpersonal deficits). Assign "behavioral experiments" in which depressive automatic thoughts are treated as hypotheses/prediction, reality-based alternative hypotheses/prediction are generated, and both are tested against the client's past, present, and/or future experiences. Conduct Problem-Solving Therapy (see Problem-Solving Therapy by Domenick Bookbinder and Rob Hickman) using techniques such as psychoeducation, modeling, and role-playing to teach client problem-solving skills (i.e., defining a problem specifically, generating possible solutions, evaluating the pros and cons of each solution, selecting and implementing a plan of action, evaluating the efficacy of the plan, accepting or revising the  plan); role-play application of the problem-solving skill to a real life issue (or assign "Applying Problem-Solving to Interpersonal Conflict" in the Adult Psychotherapy Homework Planner by Stephannie Li). Teach conflict resolution skills (e.g., empathy, active listening, "I messages," respectful communication, assertiveness without aggression, compromise); use psychoeducation, modeling, role-playing, and rehearsal to work through several current conflicts; assign homework exercises; review and repeat so as to integrate their use into the client's life. Discuss with the client the distinction between a lapse and relapse, associating a lapse with a rather common, temporary setback that may involve, for example, re-experiencing a depressive thought and/or urge to withdraw or avoid (perhaps as related to some loss or conflict) and a relapse as a sustained return to a pattern of depressive thinking and feeling usually  accompanied by interpersonal withdrawal and/or avoidance. Identify and rehearse with the client the management of future situations or circumstances in which lapses could occur. Consistent with the treatment model, discuss how cognitive, behavioral, interpersonal, and/or other factors (e.g., family history) contribute to depression. Assess the manifestation of chronic pain, its history, current status, triggers, and methods of coping (see The Handbook of Pain Assessment by Artist Pais). Teach the client self-monitoring of his/her symptoms; ask the client to keep a pain journal that records time of day, where and what he/she was doing, the severity of stress at the time, the severity of, and what was done to alleviate the pain (or assign "Pain and Stress Journal" in the Adult Psychotherapy Homework Planner by [facility]); process the journal with the client to increase understanding of the nature of the pain, cognitive, affective, and behavioral triggers, and the positive or negative effects of the coping strategies he/she is currently using. Teach the client relaxation skills (e.g., progressive muscle relaxation, guided imagery, slow diaphragmatic breathing) or mindfulness meditation, explaining the rationale and how to apply these skills to his/her daily life (see New Directions in Progressive Muscle Relaxation by Marcelyn Ditty, and Hazlett-Stevens). Assign the client to read about progressive muscle relaxation and other calming strategies in relevant books or treatment manuals (e.g., The Relaxation and Stress Reduction Workbook by Kevan Rosebush, and [place]; Living Beyond Your Pain by Celedonio Savage and Orvan Falconer). Explore the client's schema and self-talk that mediate his/her pain response, challenging the biases, assisting him/her in generating thoughts that correct for the biases, facilitate coping, and build confidence in managing pain. Use cognitive therapy techniques to help the client change  his/her view of their pain and suffering from overwhelming to manageable. Teach the client specific coping skills based on an assessment of need (e.g., problem-solving, social/communication, conflict resolution, goal-setting). Assist the client in reframing thoughts about his/her life as one that has many positive elements outside of the pain; ask him/her to list positive aspects of himself/herself as well as his life circumstances (or assign "Positive Self-Talk" and/or "What's Good about Me and My Life?" in the Adult Psychotherapy Homework Planner by Stephannie Li).   Diagnosis:MDD (major depressive disorder), recurrent episode, moderate (HCC)   Generalized anxiety disorder   Plan:  -next session will Monday, March 09, 2023 at 12pm virtual.

## 2023-03-05 ENCOUNTER — Ambulatory Visit (HOSPITAL_COMMUNITY): Payer: 59 | Admitting: Psychiatry

## 2023-03-09 ENCOUNTER — Other Ambulatory Visit (HOSPITAL_COMMUNITY): Payer: 59

## 2023-03-09 ENCOUNTER — Encounter: Payer: Self-pay | Admitting: Professional

## 2023-03-09 ENCOUNTER — Ambulatory Visit (INDEPENDENT_AMBULATORY_CARE_PROVIDER_SITE_OTHER): Payer: 59 | Admitting: Professional

## 2023-03-09 DIAGNOSIS — F411 Generalized anxiety disorder: Secondary | ICD-10-CM

## 2023-03-09 DIAGNOSIS — F331 Major depressive disorder, recurrent, moderate: Secondary | ICD-10-CM | POA: Diagnosis not present

## 2023-03-09 NOTE — Progress Notes (Signed)
 [facility] Counselor/Therapist Progress Note   Patient ID: Crystal Diaz, MRN: [id number],     Date: 03/09/2023   Time Spent: 36 minutes 1205- 1241pm   Treatment Type: Individual Therapy   Risk Assessment: Danger to Self:  No Self-injurious Behavior: No Danger to Others: No   Subjective: This session was held via video teletherapy. The patient consented to video teletherapy and was located in her office during this session. She is aware it is the responsibility of the patient to secure confidentiality on her end of the session. The provider was in a private home office for the duration of this session.    The patient arrived on time for her Caregility appointment.    Issues addressed: 1-anxiety -wants to get her life back -continue what I need to do She thinks it is a matter of time -pt is having a tough time sleeping and doesn't want to take medication -she is sleeping in increments of 3-4 hours and worries -once she is up it takes an hour or two it occurs for 2-3 days -educated   -move to a different location to sleep   -daily worry time -pt thinks the anxiety is a bigger problem than the depression -IOP provided suggestions including medicating, redirecting 2-pain is very present -she is using medication to assist -physical pain removed doesn't think she will be in as much emotional pain -it will allow her to feel better and do more -she managed her issues much better before her back issues 3-social -her and her neighbor "fell off" since the dogs came and she put up a fence -she has four dogs, she had just one dog -has considered surrendering her dogs but she does not want them to be euthanized  Treatment Plan Problems: Unipolar Depression, Anxiety, Chronic Pain, Grief / Loss Unresolved Symptoms: Excessive and/or unrealistic worry that is difficult to control occurring more days than not for at least 6 months about a number of events or  activities. Hypervigilance (e.g., feeling constantly on edge, experiencing concentration difficulties, having trouble falling or staying asleep, exhibiting a general state of irritability). Experiences pain beyond the normal healing process (six months or more) that significantly limits physical activities. Uses increased amounts of medications with little, if any, pain relief. Experiences back or neck pain, interstitial cystitis, or diabetic neuropathy. Has decreased or stopped activities such as work, household chores, socializing, exercise, sex, or other pleasurable activities because of pain. Experiences an increase in general physical discomfort (e.g., fatigue, night sweats, insomnia, muscle tension, body aches). Exhibits signs and symptoms of depression. Makes many complaintive, depressive statements like "I can't do what I used to"; "No one understands me"; "Why me?"; "When will this go away?"; "I can't take this pain anymore"; and "I can't go on." Thoughts dominated by loss coupled with poor concentration, tearful spells, and confusion about the future. Serial losses in life (i.e., deaths, divorces, jobs) that led to depression and discouragement. Lack of appetite, weight loss, and/or insomnia as well as other depression signs that occurred since the loss. Strong emotional response of sadness exhibited when losses are discussed. Feelings of guilt that not enough was done for the lost significant other, or an unreasonable belief of having contributed to the death of the significant other. Depressed or irritable mood. Decrease or loss of appetite. Diminished interest in or enjoyment of activities. Psychomotor agitation or retardation. Sleeplessness or hypersomnia. Lack of energy. Social withdrawal. Feelings of hopelessness, worthlessness, or inappropriate guilt. Low self-esteem. Unresolved grief issues.  History of chronic or recurrent depression for which the client has taken  antidepressant medication, been hospitalized, had outpatient treatment, or had a course of electroconvulsive therapy. Goals: Reduce overall frequency, intensity, and duration of the anxiety so that daily functioning is not impaired. Stabilize anxiety level while increasing ability to function on a daily basis. Resolve the core conflict that is the source of anxiety. Enhance ability to effectively cope with the full variety of life's worries and anxieties. Learn and implement coping skills that result in a reduction of anxiety and worry, and improved daily functioning. Acquire and utilize the necessary pain management skills. Find relief from pain and build renewed contentment and joy in performing activities of everyday life. Begin a healthy grieving process around the loss. Develop an awareness of how the avoidance of grieving has affected life and begin the healing process. Complete the process of letting go of the lost significant other. Alleviate depressive symptoms and return to previous level of effective functioning. Recognize, accept, and cope with feelings of depression. Develop healthy thinking patterns and beliefs about self, others, and the world that lead to the alleviation and help prevent the relapse of depression. Develop healthy interpersonal relationships that lead to the alleviation and help prevent the relapse of depression. Appropriately grieve the loss in order to normalize mood and to return to previously adaptive level of functioning. Objectives target date for all objectives is 03/01/2024 Describe situations, thoughts, feelings, and actions associated with anxieties and worries, their impact on functioning, and attempts to resolve them. Verbalize an understanding of the cognitive, physiological, and behavioral components of anxiety and its treatment. Learn and implement calming skills to reduce overall anxiety and manage anxiety symptoms. Verbalize an understanding of the  role that cognitive biases play in excessive irrational worry and persistent anxiety symptoms. Identify, challenge, and replace biased, fearful self-talk with positive, realistic, and empowering self-talk. Learn and implement problem-solving strategies for realistically addressing worries. Identify and engage in pleasant activities on a daily basis. Describe the nature of, history of, impact of, and understood causes of chronic pain. Identify and monitor specific pain triggers. Learn and implement calming skills such as relaxation, biofeedback, or mindfulness meditation to ease pain. Identify, challenge, and change maladaptive thoughts and beliefs about pain and pain management and replace them with more adaptive thoughts and beliefs. Learn and implement specific coping skills as well as when and how to use them to manage pain and its consequences. Engage in positive self-talk as an alternative to the depressing, negative thoughts about self and the world. Tell in detail the story of the current loss that is triggering symptoms. Identify what stages of grief have been experienced in the continuum of the grieving process. Begin verbalizing feelings associated with the loss. Attend a grief/loss support group. Identify how avoiding dealing with loss has negatively impacted life. Decrease unrealistic thoughts, statements, and feelings of being responsible for the loss. Express thoughts and feelings about the deceased that went unexpressed while the deceased was alive. Report decreased time spent each day focusing on the loss. Describe current and past experiences with depression including their impact on functioning and attempts to resolve it. Verbalize an accurate understanding of depression. Identify and replace thoughts and beliefs that support depression. Learn and implement behavioral strategies to overcome depression. Identify important people in life, past and present, and describe the  quality, good and poor, of those relationships. Learn and implement problem-solving and decision-making skills. Learn and implement conflict resolution skills to resolve interpersonal problems.  Learn and implement relapse prevention skills. Interventions: Use empathy, compassion, and support, allowing the client to tell in detail the story of his/her recent loss. Ask the client to elaborate in an autobiography the circumstances, feelings, and effects of the losses in him/her; assess the characteristics of the loss (e.g., type, suddenness, trauma), previous functioning, current functioning, and coping style. Ask the client to list ways that avoidance of grieving has negatively impacted his/her life. Use a cognitive therapy approach to identify the client's bias toward thoughts of personal responsibility for the loss and replace them with factual, reality-based thoughts (or assign "Negative Thoughts Trigger Negative Feelings" in the Adult Psychotherapy Homework Planner by Stephannie Li). Conduct an empty-chair exercise with the client where he/she focuses on expressing to the lost loved one imagined in the chair what he/she never said while that loved one was alive. Assign the client to visit the grave of the lost loved one to "talk to" the deceased and express his/her feelings. Ask the client to write a letter to the lost person describing his/her fond memories and/or painful and regretful memories, and how he/she currently feels life (or assign "Dear _____: A Letter to a Lost Loved One" in the Adult Psychotherapy Homework Planner by [facility]); process the letter in session. Assign the client to write to the deceased loved one with a special focus on his/her feelings associated with the last meaningful contact with that person. Suggest that the client set aside a specific time-limited period each day to focus on mourning his/her loss. After each day's time is up, the client will resume regular activities and  postpone grieving thoughts until the next scheduled time. For example, mourning times could include putting on dark clothing and/or sad music; clothing would be changed when the allotted time is up. Educate the client on the stages of the grieving process and answer any questions he/she may have. Assist the client in identifying the stages of grief that he/she has experienced and which stage he/she is presently working through. Ask the client to bring pictures or mementos connected with his/her loss to a session and talk about them (or assign "Creating a [place]" in the Adult Psychotherapy Homework Planner by Stephannie Li). Assist the client in identifying and expressing feelings connected with his/her loss. Ask the client to attend a grief/loss support group and report to the therapist how he/she felt about attending. Ask the client to describe his/her past experiences of anxiety and their impact on functioning; assess the focus, excessiveness, and uncontrollability of the worry and the type, frequency, intensity, and duration of his/her anxiety symptoms (consider using a structured interview such as The Anxiety Disorders Interview Schedule-Adult Version). Explore the client's schema and self-talk that mediate his/her fear response; assist him/her in challenging the biases; replace the distorted messages with reality-based alternatives and positive, realistic self-talk that will increase his/her self-confidence in coping with irrational fears (see Cognitive Therapy of Anxiety Disorders by Laurence Slate). Teach the client problem-solving strategies involving specifically defining a problem, generating options for addressing it, evaluating the pros and cons of each option, selecting and implementing an optional action, and reevaluating and refining the action (or assign "Applying Problem-Solving to Interpersonal Conflict" in the Adult Psychotherapy Homework Planner by Stephannie Li). Engage the client in  behavioral activation, increasing the client's contact with sources of reward, identifying processes that inhibit activation, and teaching skills to solve life problems (or assign "Identify and Schedule Pleasant Activities" in the Adult Psychotherapy Homework Planner by [facility]); use behavioral techniques such  as instruction, rehearsal, role-playing, role reversal as needed to assist adoption into the client's daily life; reinforce success. Discuss how generalized anxiety typically involves excessive worry about unrealistic threats, various bodily expressions of tension, overarousal, and hypervigilance, and avoidance of what is threatening that interact to maintain the problem (see Mastery of Your Anxiety and Worry: Therapist Guide by Renard Matter, and Barlow; Treating Generalized Anxiety Disorder by Rygh and Ida Rogue). Discuss how treatment targets worry, anxiety symptoms, and avoidance to help the client manage worry effectively, reduce overarousal, and eliminate unnecessary avoidance. Teach the client calming/relaxation skills (e.g., applied relaxation, progressive muscle relaxation, cue controlled relaxation; mindful breathing; biofeedback) and how to discriminate better between relaxation and tension; teach the client how to apply these skills to his/her daily life (e.g., New Directions in Progressive Muscle Relaxation by Marcelyn Ditty, and Hazlett-Stevens; Treating Generalized Anxiety Disorder by Rygh and Ida Rogue). Assign the client to read about progressive muscle relaxation and other calming strategies in relevant books or treatment manuals (e.g., Progressive Relaxation Training by Robb Matar and Alen Blew; Mastery of Your Anxiety and Worry: Workbook by Earlie Counts). Assist the client in analyzing his/her worries by examining potential biases such as the probability of the negative expectation occurring, the real consequences of it occurring, his/her ability to control the outcome, the  worst possible outcome, and his/her ability to accept it (see "Analyze the Probability of a Feared Event" in the Adult Psychotherapy Homework Planner by Stephannie Li; Cognitive Therapy of Anxiety Disorders by Laurence Slate). Discuss examples demonstrating that unrealistic worry typically overestimates the probability of threats and underestimates or overlooks the client's ability to manage realistic demands (or assign "Past Successful Anxiety Coping" in the Adult Psychotherapy Homework Planner by [facility]). Encourage the client to share his/her thoughts and feelings of depression; express empathy and build rapport while identifying primary cognitive, behavioral, interpersonal, or other contributors to depression. Assign the client to self-monitor thoughts, feelings, and actions in daily journal (e.g., "Negative Thoughts Trigger Negative Feelings" in the Adult Psychotherapy Homework Planner by Stephannie Li; "Daily Record of Dysfunctional Thoughts" in Cognitive Therapy of Depression by Ashby Dawes and Shea Evans); process the journal material to challenge depressive thinking patterns and replace them with reality-based thoughts. Facilitate and reinforce the client's shift from biased depressive self-talk and beliefs to reality-based cognitive messages that enhance self-confidence and increase adaptive actions (see "Positive Self-Talk" in the Adult Psychotherapy Homework Planner by Stephannie Li). Engage the client in "behavioral activation," increasing his/her activity level and contact with sources of reward, while identifying processes that inhibit activation (see Behavioral Activation for Depression by Katharine Look, Dimidjian, and Herman-Dunn; or assign "Identify and Schedule Pleasant Activities" in the Adult Psychotherapy Homework Planner by [facility]); use behavioral techniques such as instruction, rehearsal, role-playing, role reversal, as needed, to facilitate activity in the client's daily life; reinforce success. Conduct  Interpersonal Therapy (see Interpersonal Psychotherapy of Depression by Casimer Lanius al.), beginning with the assessment of the client's "interpersonal inventory" of important past and present relationships; develop a case formulation linking depression to grief, interpersonal role disputes, role transitions, and/or interpersonal deficits). Assign "behavioral experiments" in which depressive automatic thoughts are treated as hypotheses/prediction, reality-based alternative hypotheses/prediction are generated, and both are tested against the client's past, present, and/or future experiences. Conduct Problem-Solving Therapy (see Problem-Solving Therapy by Domenick Bookbinder and Rob Hickman) using techniques such as psychoeducation, modeling, and role-playing to teach client problem-solving skills (i.e., defining a problem specifically, generating possible solutions, evaluating the pros and cons of each solution, selecting and implementing a plan of action,  evaluating the efficacy of the plan, accepting or revising the plan); role-play application of the problem-solving skill to a real life issue (or assign "Applying Problem-Solving to Interpersonal Conflict" in the Adult Psychotherapy Homework Planner by Stephannie Li). Teach conflict resolution skills (e.g., empathy, active listening, "I messages," respectful communication, assertiveness without aggression, compromise); use psychoeducation, modeling, role-playing, and rehearsal to work through several current conflicts; assign homework exercises; review and repeat so as to integrate their use into the client's life. Discuss with the client the distinction between a lapse and relapse, associating a lapse with a rather common, temporary setback that may involve, for example, re-experiencing a depressive thought and/or urge to withdraw or avoid (perhaps as related to some loss or conflict) and a relapse as a sustained return to a pattern of depressive thinking and feeling usually  accompanied by interpersonal withdrawal and/or avoidance. Identify and rehearse with the client the management of future situations or circumstances in which lapses could occur. Consistent with the treatment model, discuss how cognitive, behavioral, interpersonal, and/or other factors (e.g., family history) contribute to depression. Assess the manifestation of chronic pain, its history, current status, triggers, and methods of coping (see The Handbook of Pain Assessment by Artist Pais). Teach the client self-monitoring of his/her symptoms; ask the client to keep a pain journal that records time of day, where and what he/she was doing, the severity of stress at the time, the severity of, and what was done to alleviate the pain (or assign "Pain and Stress Journal" in the Adult Psychotherapy Homework Planner by [facility]); process the journal with the client to increase understanding of the nature of the pain, cognitive, affective, and behavioral triggers, and the positive or negative effects of the coping strategies he/she is currently using. Teach the client relaxation skills (e.g., progressive muscle relaxation, guided imagery, slow diaphragmatic breathing) or mindfulness meditation, explaining the rationale and how to apply these skills to his/her daily life (see New Directions in Progressive Muscle Relaxation by Marcelyn Ditty, and Hazlett-Stevens). Assign the client to read about progressive muscle relaxation and other calming strategies in relevant books or treatment manuals (e.g., The Relaxation and Stress Reduction Workbook by Kevan Rosebush, and Mitchell; Living Beyond Your Pain by Celedonio Savage and Orvan Falconer). Explore the client's schema and self-talk that mediate his/her pain response, challenging the biases, assisting him/her in generating thoughts that correct for the biases, facilitate coping, and build confidence in managing pain. Use cognitive therapy techniques to help the client change  his/her view of their pain and suffering from overwhelming to manageable. Teach the client specific coping skills based on an assessment of need (e.g., problem-solving, social/communication, conflict resolution, goal-setting). Assist the client in reframing thoughts about his/her life as one that has many positive elements outside of the pain; ask him/her to list positive aspects of himself/herself as well as his life circumstances (or assign "Positive Self-Talk" and/or "What's Good about Me and My Life?" in the Adult Psychotherapy Homework Planner by Stephannie Li).   Diagnosis:MDD (major depressive disorder), recurrent episode, moderate (HCC)   Generalized anxiety disorder   Plan:  -introduce Clinician to her mother at next session, this is also her mother's birthday. -next session will Monday, March 16, 2023 at 12pm virtual.

## 2023-03-13 DIAGNOSIS — H52223 Regular astigmatism, bilateral: Secondary | ICD-10-CM | POA: Diagnosis not present

## 2023-03-13 DIAGNOSIS — H5210 Myopia, unspecified eye: Secondary | ICD-10-CM | POA: Diagnosis not present

## 2023-03-16 ENCOUNTER — Ambulatory Visit: Payer: 59 | Admitting: Professional

## 2023-03-18 ENCOUNTER — Telehealth (HOSPITAL_COMMUNITY): Payer: 59 | Admitting: Psychiatry

## 2023-03-18 ENCOUNTER — Encounter (HOSPITAL_COMMUNITY): Payer: Self-pay | Admitting: Psychiatry

## 2023-03-18 ENCOUNTER — Other Ambulatory Visit: Payer: Self-pay

## 2023-03-18 VITALS — Wt 245.0 lb

## 2023-03-18 DIAGNOSIS — F439 Reaction to severe stress, unspecified: Secondary | ICD-10-CM

## 2023-03-18 DIAGNOSIS — F331 Major depressive disorder, recurrent, moderate: Secondary | ICD-10-CM | POA: Diagnosis not present

## 2023-03-18 DIAGNOSIS — F411 Generalized anxiety disorder: Secondary | ICD-10-CM | POA: Diagnosis not present

## 2023-03-18 DIAGNOSIS — F410 Panic disorder [episodic paroxysmal anxiety] without agoraphobia: Secondary | ICD-10-CM

## 2023-03-18 MED ORDER — FLUOXETINE HCL 20 MG PO CAPS
20.0000 mg | ORAL_CAPSULE | Freq: Every day | ORAL | 1 refills | Status: DC
  Filled 2023-03-18 – 2023-04-20 (×2): qty 30, 30d supply, fill #0

## 2023-03-18 MED ORDER — PROPRANOLOL HCL 10 MG PO TABS
10.0000 mg | ORAL_TABLET | Freq: Two times a day (BID) | ORAL | 0 refills | Status: DC | PRN
  Filled 2023-03-18 – 2023-04-20 (×2): qty 60, 30d supply, fill #0

## 2023-03-18 MED ORDER — ARIPIPRAZOLE 15 MG PO TABS
7.5000 mg | ORAL_TABLET | Freq: Every day | ORAL | 1 refills | Status: DC
  Filled 2023-03-18 – 2023-04-20 (×2): qty 15, 30d supply, fill #0

## 2023-03-18 NOTE — Progress Notes (Signed)
 [facility] MD Virtual Progress Note   Patient Location: Work Provider Location: Home Office  I connect with patient by video and verified that I am speaking with correct person by using two identifiers. I discussed the limitations of evaluation and management by telemedicine and the availability of in person appointments. I also discussed with the patient that there may be a patient responsible charge related to this service. The patient expressed understanding and agreed to proceed.  Crystal Diaz [id number] 33 y.o.  03/18/2023 8:25 AM  History of Present Illness:  Patient is evaluated via video session.  She is at work.  She reported not taking Klonopin and started taking Tylenol 10 mg twice a day prescribed but she usually take 1 a day.  She recently finished IOP.  Her Klonopin was told to stop and Abilify was reduced from 15 mg to 7.5 mg.  She is feeling better and denies any major panic attack and has not taken the Klonopin since she finished a program.  She also seeing Olegario Messier regularly.  She understand that she need to use her coping skills to deal with ongoing follow-up in her life.  Patient told son is seeing child psychiatrist and so far stable.  She still struggle with insomnia and she feels mostly because of chronic back pain.  She denies any mania, psychosis, hallucination, crying spells or any feeling of hopelessness or worthlessness but is still have symptoms of dysphoria and anxiety.  She has no tremors, shakes or any EPS.  She denies any suicidal thoughts or any anger lately.  Her appetite is good and her weight is unchanged from the past.  Her goal is to lose the weight and start walking exercise since weather is much better.  She denies drinking or using any illegal substances.  Her job is challenging but manageable.  Past Psychiatric History: Denies any history of suicidal attempt, inpatient treatment, psychosis, hallucination, PTSD. Tried Zoloft, Effexor and  Lexapro from PCP but they were ineffective. Lamictal helped but caused rash. Had IOP in Feb 2025,   Outpatient Encounter Medications as of 03/18/2023  Medication Sig   ARIPiprazole (ABILIFY) 15 MG tablet Take 0.5 tablets (7.5 mg total) by mouth daily.   clonazePAM (KLONOPIN) 0.5 MG tablet Take 1 tablet (0.5 mg total) by mouth 3 (three) times daily as needed.   cyanocobalamin (VITAMIN B12) 1000 MCG/ML injection Inject 1 mL (1,000 mcg total) into the muscle every 30 (thirty) days.   desogestrel-ethinyl estradiol (MIRCETTE) 0.15-0.02/0.01 MG (21/5) tablet Take 1 tablet by mouth daily.   FLUoxetine (PROZAC) 20 MG capsule Take 1 capsule (20 mg total) by mouth daily.   ketoconazole (NIZORAL) 2 % cream Apply 1 Application topically 2 (two) times daily.   lidocaine (XYLOCAINE) 5 % ointment Apply 1 Application topically as needed.   medroxyPROGESTERone (PROVERA) 10 MG tablet Take 1 tablet (10 mg total) by mouth daily.   pantoprazole (PROTONIX) 40 MG tablet Take 1 tablet (40 mg total) by mouth once daily   pregabalin (LYRICA) 150 MG capsule Take 1 capsule (150 mg total) by mouth 2 (two) times daily.   Prenatal Vit-Fe Fumarate-FA (PRENATAL VITAMIN) 27-0.8 MG TABS Take 1 tablet by mouth daily.   propranolol (INDERAL) 10 MG tablet Take 1 tablet (10 mg total) by mouth 2 (two) times daily as needed (social anxiety).   SYRINGE-NEEDLE, DISP, 3 ML (B-D 3CC LUER-LOK SYR 23GX1") 23G X 1" 3 ML MISC Use once monthly with vitamin B12 as directed   Vitamin  D, Ergocalciferol, (DRISDOL) 1.25 MG (50000 UNIT) CAPS capsule Take 1 capsule (50,000 Units total) by mouth once a week for 12 weeks   No facility-administered encounter medications on file as of 03/18/2023.    No results found for this or any previous visit (from the past 2160 hours).   Psychiatric Specialty Exam: Physical Exam  Review of Systems  Musculoskeletal:  Positive for back pain.    Weight 245 lb (111.1 kg).There is no height or weight on file to  calculate BMI.  General Appearance: Casual  Eye Contact:  Good  Speech:  Slow  Volume:  Decreased  Mood:  Dysphoric  Affect:  Appropriate  Thought Process:  Goal Directed  Orientation:  Full (Time, Place, and Person)  Thought Content:  Rumination  Suicidal Thoughts:  No  Homicidal Thoughts:  No  Memory:  Immediate;   Good Recent;   Good Remote;   Good  Judgement:  Intact  Insight:  Present  Psychomotor Activity:  Decreased  Concentration:  Concentration: Good and Attention Span: Good  Recall:  Good  Fund of Knowledge:  Good  Language:  Good  Akathisia:  No  Handed:  Right  AIMS (if indicated):     Assets:  Communication Skills Desire for Improvement Housing Talents/Skills Transportation  ADL's:  Intact  Cognition:  WNL  Sleep:  fair     Assessment/Plan: GAD (generalized anxiety disorder) - Plan: propranolol (INDERAL) 10 MG tablet  MDD (major depressive disorder), recurrent episode, moderate (HCC) - Plan: ARIPiprazole (ABILIFY) 15 MG tablet, FLUoxetine (PROZAC) 20 MG capsule, propranolol (INDERAL) 10 MG tablet  Panic attacks  Trauma and stressor-related disorder  I reviewed notes from IOP.  Medicines were changed.  She is taking Prozac 20 mg daily and propranolol prescribed 10 mg twice a day but she only takes 1 pill and rarely needed second dose.  She is no longer taking Klonopin.  She cut down the Abilify 7.5.  So far medication adjustment is working and she do not have any recent crying spells or any suicidal thoughts.  She has chronic insomnia.  Recommend to consider sleep study but patient believes it is due to chronic back pain.  Encouraged to continue therapy with Teof[facility] Pod.  She has no tremors, shakes or any EPS.  I will refill Abilify 7.5 mg daily, Prozac 20 mg daily and propranolol 10 mg twice a day.  Recommended to call us back if she has any question or any concern.  Encourage walking, exercise, watching her calorie intake.  She had plan to go back to gym.   Will follow-up in 2 months.   Follow Up Instructions:     I discussed the assessment and treatment plan with the patient. The patient was provided an opportunity to ask questions and all were answered. The patient agreed with the plan and demonstrated an understanding of the instructions.   The patient was advised to call back or seek an in-person evaluation if the symptoms worsen or if the condition fails to improve as anticipated.    Collaboration of Care: Other provider involved in patient's care AEB notes are available in epic to review  Patient/Guardian was advised Release of Information must be obtained prior to any record release in order to collaborate their care with an outside provider. Patient/Guardian was advised if they have not already done so to contact the registration department to sign all necessary forms in order for [place] to release information regarding their care.   Consent: Patient/Guardian gives verbal  consent for treatment and assignment of benefits for services provided during this visit. Patient/Guardian expressed understanding and agreed to proceed.     I provided 25 minutes of non face to face time during this encounter.  Note: This document was prepared by [employer] voice dictation technology and any errors that results from this process are unintentional.    Cleotis Nipper, MD 03/18/2023

## 2023-03-23 ENCOUNTER — Ambulatory Visit: Payer: 59 | Admitting: Professional

## 2023-03-23 ENCOUNTER — Encounter: Payer: Self-pay | Admitting: Professional

## 2023-03-23 DIAGNOSIS — F331 Major depressive disorder, recurrent, moderate: Secondary | ICD-10-CM | POA: Diagnosis not present

## 2023-03-23 DIAGNOSIS — F411 Generalized anxiety disorder: Secondary | ICD-10-CM | POA: Diagnosis not present

## 2023-03-23 NOTE — Progress Notes (Signed)
 [facility] Counselor/Therapist Progress Note   Patient ID: Crystal Diaz, MRN: [id number],     Date: 03/23/2023   Time Spent: 38 minutes 1205-1243pm   Treatment Type: Individual Therapy   Risk Assessment: Danger to Self:  No Self-injurious Behavior: No Danger to Others: No   Subjective: This session was held via video teletherapy. The patient consented to video teletherapy and was located in her office during this session. She is aware it is the responsibility of the patient to secure confidentiality on her end of the session. The provider was in a private home office for the duration of this session.    The patient arrived late for her Caregility appointment.    Issues addressed: 1-homework- done at today's session -introduce Clinician to her mother at next session, this is also her mother's birthday. 2-mother anniversary -was more thankful for having had her as my mother 3-mom -"the glue to our family" -she was a hospice nurse and inspired her to get in the nursing field -she loved animals and after becoming diabetic became vegan -she changed her mindset to make those nutrition changes -trustworthy and accountable -pt misses her voice; as I got older I had my life and I'd talk to her"   -her voice was soothing to me -she has a video of both of her parents  -there's not too much bad I can say about her -disagreements with her as child/adolescent she now understands was all her 4-mood -continues to fight the anxiety and depression -struggling to be in the moment -she has intermittent thoughts   -"I just wish I didn't have those" -her son is difficult   -sometimes she needs help and not having her parents   -son's father not in his life   -just started IIH   -is not finding healthy relationships at school     -he is following people that make bad choices     -trying to imitate rappers, vaping, negative activities 5-coping skills -single parent support  group -support from [facility] staff -breathing activities -progressive muscle relaxation -thankful for today   Treatment Plan Problems: Unipolar Depression, Anxiety, Chronic Pain, Grief / Loss Unresolved Symptoms: Excessive and/or unrealistic worry that is difficult to control occurring more days than not for at least 6 months about a number of events or activities. Hypervigilance (e.g., feeling constantly on edge, experiencing concentration difficulties, having trouble falling or staying asleep, exhibiting a general state of irritability). Experiences pain beyond the normal healing process (six months or more) that significantly limits physical activities. Uses increased amounts of medications with little, if any, pain relief. Experiences back or neck pain, interstitial cystitis, or diabetic neuropathy. Has decreased or stopped activities such as work, household chores, socializing, exercise, sex, or other pleasurable activities because of pain. Experiences an increase in general physical discomfort (e.g., fatigue, night sweats, insomnia, muscle tension, body aches). Exhibits signs and symptoms of depression. Makes many complaintive, depressive statements like "I can't do what I used to"; "No one understands me"; "Why me?"; "When will this go away?"; "I can't take this pain anymore"; and "I can't go on." Thoughts dominated by loss coupled with poor concentration, tearful spells, and confusion about the future. Serial losses in life (i.e., deaths, divorces, jobs) that led to depression and discouragement. Lack of appetite, weight loss, and/or insomnia as well as other depression signs that occurred since the loss. Strong emotional response of sadness exhibited when losses are discussed. Feelings of guilt that not enough was done for  the lost significant other, or an unreasonable belief of having contributed to the death of the significant other. Depressed or irritable mood. Decrease or loss of  appetite. Diminished interest in or enjoyment of activities. Psychomotor agitation or retardation. Sleeplessness or hypersomnia. Lack of energy. Social withdrawal. Feelings of hopelessness, worthlessness, or inappropriate guilt. Low self-esteem. Unresolved grief issues. History of chronic or recurrent depression for which the client has taken antidepressant medication, been hospitalized, had outpatient treatment, or had a course of electroconvulsive therapy. Goals: Reduce overall frequency, intensity, and duration of the anxiety so that daily functioning is not impaired. Stabilize anxiety level while increasing ability to function on a daily basis. Resolve the core conflict that is the source of anxiety. Enhance ability to effectively cope with the full variety of life's worries and anxieties. Learn and implement coping skills that result in a reduction of anxiety and worry, and improved daily functioning. Acquire and utilize the necessary pain management skills. Find relief from pain and build renewed contentment and joy in performing activities of everyday life. Begin a healthy grieving process around the loss. Develop an awareness of how the avoidance of grieving has affected life and begin the healing process. Complete the process of letting go of the lost significant other. Alleviate depressive symptoms and return to previous level of effective functioning. Recognize, accept, and cope with feelings of depression. Develop healthy thinking patterns and beliefs about self, others, and the world that lead to the alleviation and help prevent the relapse of depression. Develop healthy interpersonal relationships that lead to the alleviation and help prevent the relapse of depression. Appropriately grieve the loss in order to normalize mood and to return to previously adaptive level of functioning. Objectives target date for all objectives is 03/01/2024 Describe situations, thoughts,  feelings, and actions associated with anxieties and worries, their impact on functioning, and attempts to resolve them. Verbalize an understanding of the cognitive, physiological, and behavioral components of anxiety and its treatment. Learn and implement calming skills to reduce overall anxiety and manage anxiety symptoms. Verbalize an understanding of the role that cognitive biases play in excessive irrational worry and persistent anxiety symptoms. Identify, challenge, and replace biased, fearful self-talk with positive, realistic, and empowering self-talk. Learn and implement problem-solving strategies for realistically addressing worries. Identify and engage in pleasant activities on a daily basis. Describe the nature of, history of, impact of, and understood causes of chronic pain. Identify and monitor specific pain triggers. Learn and implement calming skills such as relaxation, biofeedback, or mindfulness meditation to ease pain. Identify, challenge, and change maladaptive thoughts and beliefs about pain and pain management and replace them with more adaptive thoughts and beliefs. Learn and implement specific coping skills as well as when and how to use them to manage pain and its consequences. Engage in positive self-talk as an alternative to the depressing, negative thoughts about self and the world. Tell in detail the story of the current loss that is triggering symptoms. Identify what stages of grief have been experienced in the continuum of the grieving process. Begin verbalizing feelings associated with the loss. Attend a grief/loss support group. Identify how avoiding dealing with loss has negatively impacted life. Decrease unrealistic thoughts, statements, and feelings of being responsible for the loss. Express thoughts and feelings about the deceased that went unexpressed while the deceased was alive. Report decreased time spent each day focusing on the loss. Describe current and  past experiences with depression including their impact on functioning and attempts to resolve it.  Verbalize an accurate understanding of depression. Identify and replace thoughts and beliefs that support depression. Learn and implement behavioral strategies to overcome depression. Identify important people in life, past and present, and describe the quality, good and poor, of those relationships. Learn and implement problem-solving and decision-making skills. Learn and implement conflict resolution skills to resolve interpersonal problems. Learn and implement relapse prevention skills. Interventions: Use empathy, compassion, and support, allowing the client to tell in detail the story of his/her recent loss. Ask the client to elaborate in an autobiography the circumstances, feelings, and effects of the losses in him/her; assess the characteristics of the loss (e.g., type, suddenness, trauma), previous functioning, current functioning, and coping style. Ask the client to list ways that avoidance of grieving has negatively impacted his/her life. Use a cognitive therapy approach to identify the client's bias toward thoughts of personal responsibility for the loss and replace them with factual, reality-based thoughts (or assign "Negative Thoughts Trigger Negative Feelings" in the Adult Psychotherapy Homework Planner by Stephannie Li). Conduct an empty-chair exercise with the client where he/she focuses on expressing to the lost loved one imagined in the chair what he/she never said while that loved one was alive. Assign the client to visit the grave of the lost loved one to "talk to" the deceased and express his/her feelings. Ask the client to write a letter to the lost person describing his/her fond memories and/or painful and regretful memories, and how he/she currently feels life (or assign "Dear _____: A Letter to a Lost Loved One" in the Adult Psychotherapy Homework Planner by [facility]); process the letter  in session. Assign the client to write to the deceased loved one with a special focus on his/her feelings associated with the last meaningful contact with that person. Suggest that the client set aside a specific time-limited period each day to focus on mourning his/her loss. After each day's time is up, the client will resume regular activities and postpone grieving thoughts until the next scheduled time. For example, mourning times could include putting on dark clothing and/or sad music; clothing would be changed when the allotted time is up. Educate the client on the stages of the grieving process and answer any questions he/she may have. Assist the client in identifying the stages of grief that he/she has experienced and which stage he/she is presently working through. Ask the client to bring pictures or mementos connected with his/her loss to a session and talk about them (or assign "Creating a [place]" in the Adult Psychotherapy Homework Planner by Stephannie Li). Assist the client in identifying and expressing feelings connected with his/her loss. Ask the client to attend a grief/loss support group and report to the therapist how he/she felt about attending. Ask the client to describe his/her past experiences of anxiety and their impact on functioning; assess the focus, excessiveness, and uncontrollability of the worry and the type, frequency, intensity, and duration of his/her anxiety symptoms (consider using a structured interview such as The Anxiety Disorders Interview Schedule-Adult Version). Explore the client's schema and self-talk that mediate his/her fear response; assist him/her in challenging the biases; replace the distorted messages with reality-based alternatives and positive, realistic self-talk that will increase his/her self-confidence in coping with irrational fears (see Cognitive Therapy of Anxiety Disorders by Laurence Slate). Teach the client problem-solving strategies  involving specifically defining a problem, generating options for addressing it, evaluating the pros and cons of each option, selecting and implementing an optional action, and reevaluating and refining the action (or assign "  Applying Problem-Solving to Interpersonal Conflict" in the Adult Psychotherapy Homework Planner by [facility]). Engage the client in behavioral activation, increasing the client's contact with sources of reward, identifying processes that inhibit activation, and teaching skills to solve life problems (or assign "Identify and Schedule Pleasant Activities" in the Adult Psychotherapy Homework Planner by Stephannie Li); use behavioral techniques such as instruction, rehearsal, role-playing, role reversal as needed to assist adoption into the client's daily life; reinforce success. Discuss how generalized anxiety typically involves excessive worry about unrealistic threats, various bodily expressions of tension, overarousal, and hypervigilance, and avoidance of what is threatening that interact to maintain the problem (see Mastery of Your Anxiety and Worry: Therapist Guide by Renard Matter, and [place]; Treating Generalized Anxiety Disorder by Rygh and Ida Rogue). Discuss how treatment targets worry, anxiety symptoms, and avoidance to help the client manage worry effectively, reduce overarousal, and eliminate unnecessary avoidance. Teach the client calming/relaxation skills (e.g., applied relaxation, progressive muscle relaxation, cue controlled relaxation; mindful breathing; biofeedback) and how to discriminate better between relaxation and tension; teach the client how to apply these skills to his/her daily life (e.g., New Directions in Progressive Muscle Relaxation by Marcelyn Ditty, and Hazlett-Stevens; Treating Generalized Anxiety Disorder by Rygh and Ida Rogue). Assign the client to read about progressive muscle relaxation and other calming strategies in relevant books or treatment manuals  (e.g., Progressive Relaxation Training by Robb Matar and Alen Blew; Mastery of Your Anxiety and Worry: Workbook by Earlie Counts). Assist the client in analyzing his/her worries by examining potential biases such as the probability of the negative expectation occurring, the real consequences of it occurring, his/her ability to control the outcome, the worst possible outcome, and his/her ability to accept it (see "Analyze the Probability of a Feared Event" in the Adult Psychotherapy Homework Planner by Stephannie Li; Cognitive Therapy of Anxiety Disorders by Laurence Slate). Discuss examples demonstrating that unrealistic worry typically overestimates the probability of threats and underestimates or overlooks the client's ability to manage realistic demands (or assign "Past Successful Anxiety Coping" in the Adult Psychotherapy Homework Planner by [facility]). Encourage the client to share his/her thoughts and feelings of depression; express empathy and build rapport while identifying primary cognitive, behavioral, interpersonal, or other contributors to depression. Assign the client to self-monitor thoughts, feelings, and actions in daily journal (e.g., "Negative Thoughts Trigger Negative Feelings" in the Adult Psychotherapy Homework Planner by Stephannie Li; "Daily Record of Dysfunctional Thoughts" in Cognitive Therapy of Depression by Ashby Dawes and Shea Evans); process the journal material to challenge depressive thinking patterns and replace them with reality-based thoughts. Facilitate and reinforce the client's shift from biased depressive self-talk and beliefs to reality-based cognitive messages that enhance self-confidence and increase adaptive actions (see "Positive Self-Talk" in the Adult Psychotherapy Homework Planner by Stephannie Li). Engage the client in "behavioral activation," increasing his/her activity level and contact with sources of reward, while identifying processes that inhibit activation (see Behavioral  Activation for Depression by Katharine Look, Dimidjian, and Herman-Dunn; or assign "Identify and Schedule Pleasant Activities" in the Adult Psychotherapy Homework Planner by [facility]); use behavioral techniques such as instruction, rehearsal, role-playing, role reversal, as needed, to facilitate activity in the client's daily life; reinforce success. Conduct Interpersonal Therapy (see Interpersonal Psychotherapy of Depression by Casimer Lanius al.), beginning with the assessment of the client's "interpersonal inventory" of important past and present relationships; develop a case formulation linking depression to grief, interpersonal role disputes, role transitions, and/or interpersonal deficits). Assign "behavioral experiments" in which depressive automatic thoughts are treated as hypotheses/prediction, reality-based alternative hypotheses/prediction are generated,  and both are tested against the client's past, present, and/or future experiences. Conduct Problem-Solving Therapy (see Problem-Solving Therapy by Domenick Bookbinder and Rob Hickman) using techniques such as psychoeducation, modeling, and role-playing to teach client problem-solving skills (i.e., defining a problem specifically, generating possible solutions, evaluating the pros and cons of each solution, selecting and implementing a plan of action, evaluating the efficacy of the plan, accepting or revising the plan); role-play application of the problem-solving skill to a real life issue (or assign "Applying Problem-Solving to Interpersonal Conflict" in the Adult Psychotherapy Homework Planner by Stephannie Li). Teach conflict resolution skills (e.g., empathy, active listening, "I messages," respectful communication, assertiveness without aggression, compromise); use psychoeducation, modeling, role-playing, and rehearsal to work through several current conflicts; assign homework exercises; review and repeat so as to integrate their use into the client's life. Discuss with the client  the distinction between a lapse and relapse, associating a lapse with a rather common, temporary setback that may involve, for example, re-experiencing a depressive thought and/or urge to withdraw or avoid (perhaps as related to some loss or conflict) and a relapse as a sustained return to a pattern of depressive thinking and feeling usually accompanied by interpersonal withdrawal and/or avoidance. Identify and rehearse with the client the management of future situations or circumstances in which lapses could occur. Consistent with the treatment model, discuss how cognitive, behavioral, interpersonal, and/or other factors (e.g., family history) contribute to depression. Assess the manifestation of chronic pain, its history, current status, triggers, and methods of coping (see The Handbook of Pain Assessment by Artist Pais). Teach the client self-monitoring of his/her symptoms; ask the client to keep a pain journal that records time of day, where and what he/she was doing, the severity of stress at the time, the severity of, and what was done to alleviate the pain (or assign "Pain and Stress Journal" in the Adult Psychotherapy Homework Planner by [facility]); process the journal with the client to increase understanding of the nature of the pain, cognitive, affective, and behavioral triggers, and the positive or negative effects of the coping strategies he/she is currently using. Teach the client relaxation skills (e.g., progressive muscle relaxation, guided imagery, slow diaphragmatic breathing) or mindfulness meditation, explaining the rationale and how to apply these skills to his/her daily life (see New Directions in Progressive Muscle Relaxation by Marcelyn Ditty, and Hazlett-Stevens). Assign the client to read about progressive muscle relaxation and other calming strategies in relevant books or treatment manuals (e.g., The Relaxation and Stress Reduction Workbook by Kevan Rosebush, and  [place]; Living Beyond Your Pain by Celedonio Savage and Orvan Falconer). Explore the client's schema and self-talk that mediate his/her pain response, challenging the biases, assisting him/her in generating thoughts that correct for the biases, facilitate coping, and build confidence in managing pain. Use cognitive therapy techniques to help the client change his/her view of their pain and suffering from overwhelming to manageable. Teach the client specific coping skills based on an assessment of need (e.g., problem-solving, social/communication, conflict resolution, goal-setting). Assist the client in reframing thoughts about his/her life as one that has many positive elements outside of the pain; ask him/her to list positive aspects of himself/herself as well as his life circumstances (or assign "Positive Self-Talk" and/or "What's Good about Me and My Life?" in the Adult Psychotherapy Homework Planner by Stephannie Li).   Diagnosis:MDD (major depressive disorder), recurrent episode, moderate (HCC)   Generalized anxiety disorder   Plan:  -teach 5-4-3-2-1 at next session -move to bi-weekly visits -next session will Monday, April 06, 2023  at 12pm virtual.

## 2023-03-30 ENCOUNTER — Other Ambulatory Visit: Payer: Self-pay

## 2023-03-30 ENCOUNTER — Ambulatory Visit: Payer: 59 | Admitting: Professional

## 2023-03-31 ENCOUNTER — Ambulatory Visit: Payer: 59 | Admitting: Dietician

## 2023-04-06 ENCOUNTER — Encounter: Payer: Self-pay | Admitting: Professional

## 2023-04-06 ENCOUNTER — Ambulatory Visit (INDEPENDENT_AMBULATORY_CARE_PROVIDER_SITE_OTHER): Payer: 59 | Admitting: Professional

## 2023-04-06 DIAGNOSIS — F411 Generalized anxiety disorder: Secondary | ICD-10-CM

## 2023-04-06 DIAGNOSIS — F331 Major depressive disorder, recurrent, moderate: Secondary | ICD-10-CM | POA: Diagnosis not present
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
## 2023-04-09 ENCOUNTER — Other Ambulatory Visit: Payer: Self-pay | Admitting: Family

## 2023-04-09 ENCOUNTER — Other Ambulatory Visit: Payer: Self-pay

## 2023-04-09 DIAGNOSIS — N939 Abnormal uterine and vaginal bleeding, unspecified: Secondary | ICD-10-CM

## 2023-04-09 DIAGNOSIS — N921 Excessive and frequent menstruation with irregular cycle: Secondary | ICD-10-CM

## 2023-04-10 ENCOUNTER — Other Ambulatory Visit: Payer: Self-pay

## 2023-04-10 NOTE — Telephone Encounter (Signed)
 Patient states her period was the first week of March. She started bleeding again on Monday 04/06/23. She states the bleeding is heavy and is changing pads 3-4 times a day. She is requesting a refill of medroxyPROGESTERone (PROVERA) 10 MG tablet . Advised will send to on call provider for review.

## 2023-04-11 ENCOUNTER — Encounter: Payer: Self-pay | Admitting: Advanced Practice Midwife

## 2023-04-11 ENCOUNTER — Other Ambulatory Visit: Payer: Self-pay | Admitting: Advanced Practice Midwife

## 2023-04-11 DIAGNOSIS — N939 Abnormal uterine and vaginal bleeding, unspecified: Secondary | ICD-10-CM

## 2023-04-11 DIAGNOSIS — N921 Excessive and frequent menstruation with irregular cycle: Secondary | ICD-10-CM

## 2023-04-11 MED ORDER — MEDROXYPROGESTERONE ACETATE 10 MG PO TABS
10.0000 mg | ORAL_TABLET | Freq: Every day | ORAL | 1 refills | Status: AC
  Filled 2023-04-11: qty 10, 10d supply, fill #0
  Filled 2023-05-12: qty 10, 10d supply, fill #1

## 2023-04-11 NOTE — Progress Notes (Signed)
 Provera Rx sent per patient request for heavy breakthrough bleeding

## 2023-04-12 ENCOUNTER — Other Ambulatory Visit: Payer: Self-pay

## 2023-04-13 ENCOUNTER — Ambulatory Visit: Payer: 59 | Admitting: Professional

## 2023-04-20 ENCOUNTER — Other Ambulatory Visit: Payer: Self-pay | Admitting: Primary Care

## 2023-04-20 ENCOUNTER — Ambulatory Visit: Payer: 59 | Admitting: Professional

## 2023-04-20 ENCOUNTER — Other Ambulatory Visit: Payer: Self-pay

## 2023-04-20 DIAGNOSIS — K219 Gastro-esophageal reflux disease without esophagitis: Secondary | ICD-10-CM

## 2023-04-20 MED ORDER — PANTOPRAZOLE SODIUM 40 MG PO TBEC
40.0000 mg | DELAYED_RELEASE_TABLET | Freq: Every day | ORAL | 0 refills | Status: DC
  Filled 2023-04-20 – 2023-05-12 (×2): qty 90, 90d supply, fill #0

## 2023-04-20 NOTE — Telephone Encounter (Signed)
 Spoke with patient. Advised of provera rx sent in on 04/11/23. Patient was not notified by pharmacy and has not picked up rx. Apologized for the delay. Advised on call schedule confusion and provider reply over weekend and message in my in basket, but I was out on PAL last week. Patient had not viewed my chart message. She advised her bleeding has slowed down. She is due for her period this week. She will pick up rx and take if issue continues.

## 2023-04-21 ENCOUNTER — Other Ambulatory Visit: Payer: Self-pay

## 2023-04-27 ENCOUNTER — Ambulatory Visit: Payer: 59 | Admitting: Professional

## 2023-05-04 ENCOUNTER — Ambulatory Visit: Payer: 59 | Admitting: Professional

## 2023-05-04 ENCOUNTER — Other Ambulatory Visit: Payer: Self-pay

## 2023-05-11 ENCOUNTER — Ambulatory Visit: Payer: 59 | Admitting: Professional

## 2023-05-12 ENCOUNTER — Other Ambulatory Visit: Payer: Self-pay

## 2023-05-18 ENCOUNTER — Encounter: Payer: Self-pay | Admitting: Professional

## 2023-05-18 ENCOUNTER — Ambulatory Visit (INDEPENDENT_AMBULATORY_CARE_PROVIDER_SITE_OTHER): Admitting: Professional

## 2023-05-18 DIAGNOSIS — F331 Major depressive disorder, recurrent, moderate: Secondary | ICD-10-CM | POA: Diagnosis not present

## 2023-05-18 DIAGNOSIS — F411 Generalized anxiety disorder: Secondary | ICD-10-CM
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
## 2023-05-21 ENCOUNTER — Other Ambulatory Visit: Payer: Self-pay

## 2023-05-21 ENCOUNTER — Telehealth (HOSPITAL_COMMUNITY): Admitting: Psychiatry

## 2023-05-21 ENCOUNTER — Encounter (HOSPITAL_COMMUNITY): Payer: Self-pay | Admitting: Psychiatry

## 2023-05-21 ENCOUNTER — Other Ambulatory Visit (HOSPITAL_COMMUNITY): Payer: Self-pay

## 2023-05-21 VITALS — Wt 250.0 lb

## 2023-05-21 DIAGNOSIS — F411 Generalized anxiety disorder: Secondary | ICD-10-CM | POA: Diagnosis not present

## 2023-05-21 DIAGNOSIS — F331 Major depressive disorder, recurrent, moderate: Secondary | ICD-10-CM

## 2023-05-21 DIAGNOSIS — F410 Panic disorder [episodic paroxysmal anxiety] without agoraphobia: Secondary | ICD-10-CM | POA: Diagnosis not present

## 2023-05-21 MED ORDER — PROPRANOLOL HCL 10 MG PO TABS
10.0000 mg | ORAL_TABLET | Freq: Every day | ORAL | 2 refills | Status: AC
  Filled 2023-05-21: qty 30, 30d supply, fill #0

## 2023-05-21 MED ORDER — FLUOXETINE HCL 20 MG PO CAPS
20.0000 mg | ORAL_CAPSULE | Freq: Every day | ORAL | 2 refills | Status: DC
  Filled 2023-05-21: qty 30, 30d supply, fill #0

## 2023-05-21 MED ORDER — ARIPIPRAZOLE 15 MG PO TABS
7.5000 mg | ORAL_TABLET | Freq: Every day | ORAL | 2 refills | Status: DC
Start: 2023-05-21 — End: 2023-11-27
  Filled 2023-05-21: qty 15, 30d supply, fill #0

## 2023-05-21 NOTE — Progress Notes (Signed)
 [facility] MD Virtual Progress Note   Patient Location: Work Provider Location: Office  I connect with patient by video and verified that I am speaking with correct person by using two identifiers. I discussed the limitations of evaluation and management by telemedicine and the availability of in person appointments. I also discussed with the patient that there may be a patient responsible charge related to this service. The patient expressed understanding and agreed to proceed.  Crystal Diaz [id number] 33 y.o.  05/21/2023 8:22 AM  History of Present Illness:  Patient is evaluated by video session.  She is taking all her medication as prescribed.  She admitted weight gain because she is not as active and not doing exercise or watching her calorie intake.  She reported her job is going so-so and now she is thinking to find another job where she can see herself growing and had better environment.  She reported son again suspended.  She is seeing Devera Fluke once a month.  Patient told sleeping better than before.  She has no tremors, shakes or any EPS.  She denies any suicidal thoughts or homicidal thoughts.  She denies any panic attack.  She denies any hallucination, paranoia.  Her long-term plan is to lose weight however she has not started walking.  She is taking propranolol  10 mg once a day even though prescribed twice a day.  She has not seen primary care in a while and had not had blood work.  Patient denies any major panic attack.  Past Psychiatric History: Denies any history of suicidal attempt, inpatient treatment, psychosis, hallucination, PTSD. Tried Zoloft , Effexor  and Lexapro  from PCP but they were ineffective. Lamictal  helped but caused rash. Had IOP in Feb 2025,    Outpatient Encounter Medications as of 05/21/2023  Medication Sig   ARIPiprazole  (ABILIFY ) 15 MG tablet Take 0.5 tablets (7.5 mg total) by mouth daily.   clonazePAM  (KLONOPIN ) 0.5 MG tablet Take 1  tablet (0.5 mg total) by mouth 3 (three) times daily as needed. (Patient not taking: Reported on 03/18/2023)   cyanocobalamin  (VITAMIN B12) 1000 MCG/ML injection Inject 1 mL (1,000 mcg total) into the muscle every 30 (thirty) days.   desogestrel -ethinyl estradiol  (MIRCETTE ) 0.15-0.02/0.01 MG (21/5) tablet Take 1 tablet by mouth daily.   FLUoxetine  (PROZAC ) 20 MG capsule Take 1 capsule (20 mg total) by mouth daily.   ketoconazole  (NIZORAL ) 2 % cream Apply 1 Application topically 2 (two) times daily.   lidocaine  (XYLOCAINE ) 5 % ointment Apply 1 Application topically as needed.   medroxyPROGESTERone  (PROVERA ) 10 MG tablet Take 1 tablet (10 mg total) by mouth daily.   pantoprazole  (PROTONIX ) 40 MG tablet Take 1 tablet (40 mg total) by mouth daily for heartburn   pregabalin  (LYRICA ) 150 MG capsule Take 1 capsule (150 mg total) by mouth 2 (two) times daily.   Prenatal Vit-Fe Fumarate-FA (PRENATAL VITAMIN) 27-0.8 MG TABS Take 1 tablet by mouth daily.   propranolol  (INDERAL ) 10 MG tablet Take 1 tablet (10 mg total) by mouth 2 (two) times daily as needed (social anxiety).   SYRINGE-NEEDLE, DISP, 3 ML (B-D 3CC LUER-LOK SYR 23GX1") 23G X 1" 3 ML MISC Use once monthly with vitamin B12 as directed   Vitamin D , Ergocalciferol , (DRISDOL ) 1.25 MG (50000 UNIT) CAPS capsule Take 1 capsule (50,000 Units total) by mouth once a week for 12 weeks   No facility-administered encounter medications on file as of 05/21/2023.    No results found for this or any previous visit (  from the past 2160 hours).   Psychiatric Specialty Exam: Physical Exam  Review of Systems  Weight 250 lb (113.4 kg).There is no height or weight on file to calculate BMI.  General Appearance: Casual  Eye Contact:  Good  Speech:  Clear and Coherent and Normal Rate  Volume:  Normal  Mood:  Euthymic  Affect:  Congruent  Thought Process:  Goal Directed  Orientation:  Full (Time, Place, and Person)  Thought Content:  Logical  Suicidal Thoughts:   No  Homicidal Thoughts:  No  Memory:  Immediate;   Good Recent;   Good Remote;   Good  Judgement:  Good  Insight:  Present  Psychomotor Activity:  Normal  Concentration:  Concentration: Good and Attention Span: Good  Recall:  Good  Fund of Knowledge:  Good  Language:  Good  Akathisia:  No  Handed:  Right  AIMS (if indicated):     Assets:  Communication Skills Desire for Improvement Housing Talents/Skills Transportation  ADL's:  Intact  Cognition:  WNL  Sleep:  fair       01/28/2023    1:18 PM 01/26/2023   12:22 PM 01/12/2023    8:19 AM 02/18/2022    9:48 AM 10/21/2021    9:49 AM  Depression screen PHQ 2/9  Decreased Interest 3 3 0 2 2  Down, Depressed, Hopeless 3 2 0 2 3  PHQ - 2 Score 6 5 0 4 5  Altered sleeping 3 3  3 3   Tired, decreased energy 3 2  2 3   Change in appetite 2 2  2 1   Feeling bad or failure about yourself  3 2  2 2   Trouble concentrating 2 1  1 2   Moving slowly or fidgety/restless 1 0  0 1  Suicidal thoughts 3 1  1 3   PHQ-9 Score 23 16  15 20   Difficult doing work/chores Very difficult Very difficult  Extremely dIfficult Extremely dIfficult    Assessment/Plan: MDD (major depressive disorder), recurrent episode, moderate (HCC) - Plan: propranolol  (INDERAL ) 10 MG tablet, ARIPiprazole  (ABILIFY ) 15 MG tablet, FLUoxetine  (PROZAC ) 20 MG capsule  GAD (generalized anxiety disorder) - Plan: propranolol  (INDERAL ) 10 MG tablet  Panic attacks  Patient doing better since taking the medication every day.  She has cut down her propranolol  and only taking 10 mg and she feel it is helping her anxiety and panic attack.  Continue Abilify  7.5 mg, Prozac  20 mg daily and propranolol  10 mg daily.  She has chronic insomnia and we have recommended sleep study but patient has not seen PCP.  We will do labs as patient has no blood work in a while.  Will order CBC, CMP, hemoglobin A1c.  She admitted weight gain but also not doing exercise.  Encourage watching her calorie intake.   Will follow-up in 3 months however she can call us  sooner if needed earlier appointment.  Encouraged to continue therapy with Devera Fluke.   Follow Up Instructions:     I discussed the assessment and treatment plan with the patient. The patient was provided an opportunity to ask questions and all were answered. The patient agreed with the plan and demonstrated an understanding of the instructions.   The patient was advised to call back or seek an in-person evaluation if the symptoms worsen or if the condition fails to improve as anticipated.    Collaboration of Care: Other provider involved in patient's care AEB notes are available in epic to review  Patient/Guardian was advised  Release of Information must be obtained prior to any record release in order to collaborate their care with an outside provider. Patient/Guardian was advised if they have not already done so to contact the registration department to sign all necessary forms in order for us  to release information regarding their care.   Consent: Patient/Guardian gives verbal consent for treatment and assignment of benefits for services provided during this visit. Patient/Guardian expressed understanding and agreed to proceed.     Total encounter time 21 minutes which includes face-to-face time, chart reviewed, care coordination, order entry and documentation during this encounter.   Note: This document was prepared by [employer] voice dictation technology and any errors that results from this process are unintentional.    Arturo Late, MD 05/21/2023

## 2023-05-22 ENCOUNTER — Ambulatory Visit (HOSPITAL_BASED_OUTPATIENT_CLINIC_OR_DEPARTMENT_OTHER)

## 2023-05-22 DIAGNOSIS — F331 Major depressive disorder, recurrent, moderate: Secondary | ICD-10-CM

## 2023-05-22 NOTE — Progress Notes (Signed)
 Patient arrived today for her due labs. Patient was well groomed and pleasant. Patients Left arm was prepped for venipuncture, using a 22G Butterfly I got a lavender and a tiger top. Patient tolerated well and without complaint

## 2023-05-23 LAB — COMPREHENSIVE METABOLIC PANEL WITH GFR
ALT: 12 IU/L (ref 0–32)
AST: 22 IU/L (ref 0–40)
Albumin: 3.7 g/dL — ABNORMAL LOW (ref 3.9–4.9)
Alkaline Phosphatase: 59 IU/L (ref 44–121)
BUN/Creatinine Ratio: 12 (ref 9–23)
BUN: 9 mg/dL (ref 6–20)
Bilirubin Total: 0.2 mg/dL (ref 0.0–1.2)
CO2: 20 mmol/L (ref 20–29)
Calcium: 9 mg/dL (ref 8.7–10.2)
Chloride: 103 mmol/L (ref 96–106)
Creatinine, Ser: 0.78 mg/dL (ref 0.57–1.00)
Globulin, Total: 2.6 g/dL (ref 1.5–4.5)
Glucose: 69 mg/dL — ABNORMAL LOW (ref 70–99)
Potassium: 4.7 mmol/L (ref 3.5–5.2)
Sodium: 138 mmol/L (ref 134–144)
Total Protein: 6.3 g/dL (ref 6.0–8.5)
eGFR: 102 mL/min/{1.73_m2} (ref >59)

## 2023-05-23 LAB — CBC WITH DIFFERENTIAL/PLATELET
Basophils Absolute: 0 10*3/uL (ref 0.0–0.2)
Basos: 1 %
EOS (ABSOLUTE): 0.1 10*3/uL (ref 0.0–0.4)
Eos: 2 %
Hematocrit: 39.3 % (ref 34.0–46.6)
Hemoglobin: 12 g/dL (ref 11.1–15.9)
Immature Grans (Abs): 0 10*3/uL (ref 0.0–0.1)
Immature Granulocytes: 0 %
Lymphocytes Absolute: 1.8 10*3/uL (ref 0.7–3.1)
Lymphs: 26 %
MCH: 26.7 pg (ref 26.6–33.0)
MCHC: 30.5 g/dL — ABNORMAL LOW (ref 31.5–35.7)
MCV: 88 fL (ref 79–97)
Monocytes Absolute: 0.5 10*3/uL (ref 0.1–0.9)
Monocytes: 8 %
Neutrophils Absolute: 4.5 10*3/uL (ref 1.4–7.0)
Neutrophils: 63 %
Platelets: 364 10*3/uL (ref 150–450)
RBC: 4.49 x10E6/uL (ref 3.77–5.28)
RDW: 14.4 % (ref 11.7–15.4)
WBC: 7.1 10*3/uL (ref 3.4–10.8)

## 2023-05-23 LAB — HEMOGLOBIN A1C
Est. average glucose Bld gHb Est-mCnc: 111 mg/dL
Hgb A1c MFr Bld: 5.5 % (ref 4.8–5.6)

## 2023-06-01 ENCOUNTER — Other Ambulatory Visit: Payer: Self-pay

## 2023-06-01 ENCOUNTER — Ambulatory Visit: Admitting: Professional

## 2023-06-15 ENCOUNTER — Ambulatory Visit: Admitting: Professional

## 2023-06-29 ENCOUNTER — Ambulatory Visit (INDEPENDENT_AMBULATORY_CARE_PROVIDER_SITE_OTHER): Admitting: Professional

## 2023-06-29 ENCOUNTER — Encounter: Payer: Self-pay | Admitting: Professional

## 2023-06-29 DIAGNOSIS — F411 Generalized anxiety disorder: Secondary | ICD-10-CM | POA: Diagnosis not present

## 2023-06-29 DIAGNOSIS — F331 Major depressive disorder, recurrent, moderate: Secondary | ICD-10-CM

## 2023-06-29 NOTE — Progress Notes (Signed)
 [facility] Counselor/Therapist Progress Note   Patient ID: Crystal Diaz, MRN: [id number],     Date: 06/29/2023   Time Spent: 28 minutes 1158am-1226pm   Treatment Type: Individual Therapy   Risk Assessment: Danger to Self:  No Self-injurious Behavior: No Danger to Others: No   Subjective: This session was held via video teletherapy. The patient consented to video teletherapy and was located in her home during this session. She is aware it is the responsibility of the patient to secure confidentiality on her end of the session. The provider was in a private home office for the duration of this session.    The patient arrived early for her Caregility appointment.    Issues addressed: 1-brother -pt is excited that she will see Saturday for a planned visit 2-son -giving her more trouble lately not listening to her -today, in this hour, he is being doing better -she had to call police on three occasions after he left the house between Thursday and Friday -his therapist was able to witness the behavior and is looking to get him 30 days inpatient -her son is working to sell his things and that is why he is leaving home -he wants to make money and she is unsure why -he was found at the park with some marijuana that he was giving to his friend -someone at the park gave him money to sell the drugs but he never got -he is no longer allowed to go to the basketball court -her son called his dad a lot and his father is not responding and this is really impacting him -mother admits that she is over reacting   Treatment Plan Problems: Unipolar Depression, Anxiety, Chronic Pain, Grief / Loss Unresolved Symptoms: Excessive and/or unrealistic worry that is difficult to control occurring more days than not for at least 6 months about a number of events or activities. Hypervigilance (e.g., feeling constantly on edge, experiencing concentration difficulties, having trouble falling  or staying asleep, exhibiting a general state of irritability). Experiences pain beyond the normal healing process (six months or more) that significantly limits physical activities. Uses increased amounts of medications with little, if any, pain relief. Experiences back or neck pain, interstitial cystitis, or diabetic neuropathy. Has decreased or stopped activities such as work, household chores, socializing, exercise, sex, or other pleasurable activities because of pain. Experiences an increase in general physical discomfort (e.g., fatigue, night sweats, insomnia, muscle tension, body aches). Exhibits signs and symptoms of depression. Makes many complaintive, depressive statements like I can't do what I used to; No one understands me; Why me?; When will this go away?; I can't take this pain anymore; and I can't go on. Thoughts dominated by loss coupled with poor concentration, tearful spells, and confusion about the future. Serial losses in life (i.e., deaths, divorces, jobs) that led to depression and discouragement. Lack of appetite, weight loss, and/or insomnia as well as other depression signs that occurred since the loss. Strong emotional response of sadness exhibited when losses are discussed. Feelings of guilt that not enough was done for the lost significant other, or an unreasonable belief of having contributed to the death of the significant other. Depressed or irritable mood. Decrease or loss of appetite. Diminished interest in or enjoyment of activities. Psychomotor agitation or retardation. Sleeplessness or hypersomnia. Lack of energy. Social withdrawal. Feelings of hopelessness, worthlessness, or inappropriate guilt. Low self-esteem. Unresolved grief issues. History of chronic or recurrent depression for which the client has taken antidepressant medication, been  hospitalized, had outpatient treatment, or had a course of electroconvulsive therapy. Goals: Reduce  overall frequency, intensity, and duration of the anxiety so that daily functioning is not impaired. Stabilize anxiety level while increasing ability to function on a daily basis. Resolve the core conflict that is the source of anxiety. Enhance ability to effectively cope with the full variety of life's worries and anxieties. Learn and implement coping skills that result in a reduction of anxiety and worry, and improved daily functioning. Acquire and utilize the necessary pain management skills. Find relief from pain and build renewed contentment and joy in performing activities of everyday life. Begin a healthy grieving process around the loss. Develop an awareness of how the avoidance of grieving has affected life and begin the healing process. Complete the process of letting go of the lost significant other. Alleviate depressive symptoms and return to previous level of effective functioning. Recognize, accept, and cope with feelings of depression. Develop healthy thinking patterns and beliefs about self, others, and the world that lead to the alleviation and help prevent the relapse of depression. Develop healthy interpersonal relationships that lead to the alleviation and help prevent the relapse of depression. Appropriately grieve the loss in order to normalize mood and to return to previously adaptive level of functioning. Objectives target date for all objectives is 03/01/2024 Describe situations, thoughts, feelings, and actions associated with anxieties and worries, their impact on functioning, and attempts to resolve them. Verbalize an understanding of the cognitive, physiological, and behavioral components of anxiety and its treatment. Learn and implement calming skills to reduce overall anxiety and manage anxiety symptoms. Verbalize an understanding of the role that cognitive biases play in excessive irrational worry and persistent anxiety symptoms. Identify, challenge, and replace  biased, fearful self-talk with positive, realistic, and empowering self-talk. Learn and implement problem-solving strategies for realistically addressing worries. Identify and engage in pleasant activities on a daily basis. Describe the nature of, history of, impact of, and understood causes of chronic pain. Identify and monitor specific pain triggers. Learn and implement calming skills such as relaxation, biofeedback, or mindfulness meditation to ease pain. Identify, challenge, and change maladaptive thoughts and beliefs about pain and pain management and replace them with more adaptive thoughts and beliefs. Learn and implement specific coping skills as well as when and how to use them to manage pain and its consequences. Engage in positive self-talk as an alternative to the depressing, negative thoughts about self and the world. Tell in detail the story of the current loss that is triggering symptoms. Identify what stages of grief have been experienced in the continuum of the grieving process. Begin verbalizing feelings associated with the loss. Attend a grief/loss support group. Identify how avoiding dealing with loss has negatively impacted life. Decrease unrealistic thoughts, statements, and feelings of being responsible for the loss. Express thoughts and feelings about the deceased that went unexpressed while the deceased was alive. Report decreased time spent each day focusing on the loss. Describe current and past experiences with depression including their impact on functioning and attempts to resolve it. Verbalize an accurate understanding of depression. Identify and replace thoughts and beliefs that support depression. Learn and implement behavioral strategies to overcome depression. Identify important people in life, past and present, and describe the quality, good and poor, of those relationships. Learn and implement problem-solving and decision-making skills. Learn and implement  conflict resolution skills to resolve interpersonal problems. Learn and implement relapse prevention skills. Interventions: Use empathy, compassion, and support, allowing the client  to tell in detail the story of his/her recent loss. Ask the client to elaborate in an autobiography the circumstances, feelings, and effects of the losses in him/her; assess the characteristics of the loss (e.g., type, suddenness, trauma), previous functioning, current functioning, and coping style. Ask the client to list ways that avoidance of grieving has negatively impacted his/her life. Use a cognitive therapy approach to identify the client's bias toward thoughts of personal responsibility for the loss and replace them with factual, reality-based thoughts (or assign Negative Thoughts Trigger Negative Feelings in the Adult Psychotherapy Homework Planner by Beacher Bottoms). Conduct an empty-chair exercise with the client where he/she focuses on expressing to the lost loved one imagined in the chair what he/she never said while that loved one was alive. Assign the client to visit the grave of the lost loved one to talk to the deceased and express his/her feelings. Ask the client to write a letter to the lost person describing his/her fond memories and/or painful and regretful memories, and how he/she currently feels life (or assign Dear _____: A Letter to a Lost Loved One in the Adult Psychotherapy Homework Planner by [facility]); process the letter in session. Assign the client to write to the deceased loved one with a special focus on his/her feelings associated with the last meaningful contact with that person. Suggest that the client set aside a specific time-limited period each day to focus on mourning his/her loss. After each day's time is up, the client will resume regular activities and postpone grieving thoughts until the next scheduled time. For example, mourning times could include putting on dark clothing and/or sad  music; clothing would be changed when the allotted time is up. Educate the client on the stages of the grieving process and answer any questions he/she may have. Assist the client in identifying the stages of grief that he/she has experienced and which stage he/she is presently working through. Ask the client to bring pictures or mementos connected with his/her loss to a session and talk about them (or assign Creating a Patent examiner in the Adult Psychotherapy Administrator, arts by Beacher Bottoms). Assist the client in identifying and expressing feelings connected with his/her loss. Ask the client to attend a grief/loss support group and report to the therapist how he/she felt about attending. Ask the client to describe his/her past experiences of anxiety and their impact on functioning; assess the focus, excessiveness, and uncontrollability of the worry and the type, frequency, intensity, and duration of his/her anxiety symptoms (consider using a structured interview such as The Anxiety Disorders Interview Schedule-Adult Version). Explore the client's schema and self-talk that mediate his/her fear response; assist him/her in challenging the biases; replace the distorted messages with reality-based alternatives and positive, realistic self-talk that will increase his/her self-confidence in coping with irrational fears (see Cognitive Therapy of Anxiety Disorders by Anderson Kaufman). Teach the client problem-solving strategies involving specifically defining a problem, generating options for addressing it, evaluating the pros and cons of each option, selecting and implementing an optional action, and reevaluating and refining the action (or assign Applying Problem-Solving to Interpersonal Conflict in the Adult Psychotherapy Homework Planner by Beacher Bottoms). Engage the client in behavioral activation, increasing the client's contact with sources of reward, identifying processes that inhibit activation, and teaching  skills to solve life problems (or assign Identify and Schedule Pleasant Activities in the Adult Psychotherapy Homework Planner by Beacher Bottoms); use behavioral techniques such as instruction, rehearsal, role-playing, role reversal as needed to assist adoption into the client's daily  life; reinforce success. Discuss how generalized anxiety typically involves excessive worry about unrealistic threats, various bodily expressions of tension, overarousal, and hypervigilance, and avoidance of what is threatening that interact to maintain the problem (see Mastery of Your Anxiety and Worry: Therapist Guide by Jame Maze, and [place]; Treating Generalized Anxiety Disorder by Rygh and Joya Nissen). Discuss how treatment targets worry, anxiety symptoms, and avoidance to help the client manage worry effectively, reduce overarousal, and eliminate unnecessary avoidance. Teach the client calming/relaxation skills (e.g., applied relaxation, progressive muscle relaxation, cue controlled relaxation; mindful breathing; biofeedback) and how to discriminate better between relaxation and tension; teach the client how to apply these skills to his/her daily life (e.g., New Directions in Progressive Muscle Relaxation by Fara Hone, and Hazlett-Stevens; Treating Generalized Anxiety Disorder by Rygh and Joya Nissen). Assign the client to read about progressive muscle relaxation and other calming strategies in relevant books or treatment manuals (e.g., Progressive Relaxation Training by Rodolfo Clan and Arvil Birks; Mastery of Your Anxiety and Worry: Workbook by Rodney Clamp). Assist the client in analyzing his/her worries by examining potential biases such as the probability of the negative expectation occurring, the real consequences of it occurring, his/her ability to control the outcome, the worst possible outcome, and his/her ability to accept it (see Analyze the Probability of a Feared Event in the Adult Psychotherapy  Homework Planner by Beacher Bottoms; Cognitive Therapy of Anxiety Disorders by Anderson Kaufman). Discuss examples demonstrating that unrealistic worry typically overestimates the probability of threats and underestimates or overlooks the client's ability to manage realistic demands (or assign Past Successful Anxiety Coping in the Adult Psychotherapy Homework Planner by Beacher Bottoms). Encourage the client to share his/her thoughts and feelings of depression; express empathy and build rapport while identifying primary cognitive, behavioral, interpersonal, or other contributors to depression. Assign the client to self-monitor thoughts, feelings, and actions in daily journal (e.g., Negative Thoughts Trigger Negative Feelings in the Adult Psychotherapy Homework Planner by Beacher Bottoms; Daily Record of Dysfunctional Thoughts in Cognitive Therapy of Depression by Bolling Bushy and Roselle Conner); process the journal material to challenge depressive thinking patterns and replace them with reality-based thoughts. Facilitate and reinforce the client's shift from biased depressive self-talk and beliefs to reality-based cognitive messages that enhance self-confidence and increase adaptive actions (see Positive Self-Talk in the Adult Psychotherapy Homework Planner by Beacher Bottoms). Engage the client in behavioral activation, increasing his/her activity level and contact with sources of reward, while identifying processes that inhibit activation (see Behavioral Activation for Depression by Lynette Saras, Dimidjian, and Herman-Dunn; or assign Identify and Schedule Pleasant Activities in the Adult Psychotherapy Homework Planner by [facility]); use behavioral techniques such as instruction, rehearsal, role-playing, role reversal, as needed, to facilitate activity in the client's daily life; reinforce success. Conduct Interpersonal Therapy (see Interpersonal Psychotherapy of Depression by Willey Harrier al.), beginning with the assessment of the client's  interpersonal inventory of important past and present relationships; develop a case formulation linking depression to grief, interpersonal role disputes, role transitions, and/or interpersonal deficits). Assign behavioral experiments in which depressive automatic thoughts are treated as hypotheses/prediction, reality-based alternative hypotheses/prediction are generated, and both are tested against the client's past, present, and/or future experiences. Conduct Problem-Solving Therapy (see Problem-Solving Therapy by Donnice Gale and Nezu) using techniques such as psychoeducation, modeling, and role-playing to teach client problem-solving skills (i.e., defining a problem specifically, generating possible solutions, evaluating the pros and cons of each solution, selecting and implementing a plan of action, evaluating the efficacy of the plan, accepting or revising the plan); role-play application of the  problem-solving skill to a real life issue (or assign Applying Problem-Solving to Interpersonal Conflict in the Adult Psychotherapy Homework Planner by Beacher Bottoms). Teach conflict resolution skills (e.g., empathy, active listening, I messages, respectful communication, assertiveness without aggression, compromise); use psychoeducation, modeling, role-playing, and rehearsal to work through several current conflicts; assign homework exercises; review and repeat so as to integrate their use into the client's life. Discuss with the client the distinction between a lapse and relapse, associating a lapse with a rather common, temporary setback that may involve, for example, re-experiencing a depressive thought and/or urge to withdraw or avoid (perhaps as related to some loss or conflict) and a relapse as a sustained return to a pattern of depressive thinking and feeling usually accompanied by interpersonal withdrawal and/or avoidance. Identify and rehearse with the client the management of future situations or  circumstances in which lapses could occur. Consistent with the treatment model, discuss how cognitive, behavioral, interpersonal, and/or other factors (e.g., family history) contribute to depression. Assess the manifestation of chronic pain, its history, current status, triggers, and methods of coping (see The Handbook of Pain Assessment by Durand Gift). Teach the client self-monitoring of his/her symptoms; ask the client to keep a pain journal that records time of day, where and what he/she was doing, the severity of stress at the time, the severity of, and what was done to alleviate the pain (or assign Pain and Stress Journal in the Adult Psychotherapy Homework Planner by [facility]); process the journal with the client to increase understanding of the nature of the pain, cognitive, affective, and behavioral triggers, and the positive or negative effects of the coping strategies he/she is currently using. Teach the client relaxation skills (e.g., progressive muscle relaxation, guided imagery, slow diaphragmatic breathing) or mindfulness meditation, explaining the rationale and how to apply these skills to his/her daily life (see New Directions in Progressive Muscle Relaxation by Fara Hone, and Hazlett-Stevens). Assign the client to read about progressive muscle relaxation and other calming strategies in relevant books or treatment manuals (e.g., The Relaxation and Stress Reduction Workbook by Jadine May, and [place]; Living Beyond Your Pain by Zadie Herter and Diona Franklin). Explore the client's schema and self-talk that mediate his/her pain response, challenging the biases, assisting him/her in generating thoughts that correct for the biases, facilitate coping, and build confidence in managing pain. Use cognitive therapy techniques to help the client change his/her view of their pain and suffering from overwhelming to manageable. Teach the client specific coping skills based on an assessment  of need (e.g., problem-solving, social/communication, conflict resolution, goal-setting). Assist the client in reframing thoughts about his/her life as one that has many positive elements outside of the pain; ask him/her to list positive aspects of himself/herself as well as his life circumstances (or assign Positive Self-Talk and/or What's Good about Me and My Life? in the Adult Psychotherapy Homework Planner by Beacher Bottoms).   Diagnosis:MDD (major depressive disorder), recurrent episode, moderate (HCC)   Generalized anxiety disorder   Plan:  -next session will Monday, Jun 12, 2023 at 12pm virtual.

## 2023-07-07 ENCOUNTER — Ambulatory Visit: Admitting: Obstetrics and Gynecology

## 2023-07-10 ENCOUNTER — Ambulatory Visit (INDEPENDENT_AMBULATORY_CARE_PROVIDER_SITE_OTHER): Payer: 59 | Admitting: Primary Care

## 2023-07-10 ENCOUNTER — Encounter: Payer: Self-pay | Admitting: Primary Care

## 2023-07-10 VITALS — BP 106/64 | HR 85 | Temp 97.5°F | Ht 69.0 in | Wt 272.0 lb

## 2023-07-10 DIAGNOSIS — E538 Deficiency of other specified B group vitamins: Secondary | ICD-10-CM | POA: Diagnosis not present

## 2023-07-10 DIAGNOSIS — E559 Vitamin D deficiency, unspecified: Secondary | ICD-10-CM

## 2023-07-10 DIAGNOSIS — K909 Intestinal malabsorption, unspecified: Secondary | ICD-10-CM | POA: Diagnosis not present

## 2023-07-10 DIAGNOSIS — Z6841 Body Mass Index (BMI) 40.0 and over, adult: Secondary | ICD-10-CM | POA: Diagnosis not present

## 2023-07-10 DIAGNOSIS — Z0000 Encounter for general adult medical examination without abnormal findings: Secondary | ICD-10-CM

## 2023-07-10 DIAGNOSIS — R519 Headache, unspecified: Secondary | ICD-10-CM

## 2023-07-10 DIAGNOSIS — F411 Generalized anxiety disorder: Secondary | ICD-10-CM | POA: Diagnosis not present

## 2023-07-10 DIAGNOSIS — E66813 Obesity, class 3: Secondary | ICD-10-CM | POA: Diagnosis not present

## 2023-07-10 DIAGNOSIS — F331 Major depressive disorder, recurrent, moderate: Secondary | ICD-10-CM | POA: Diagnosis not present

## 2023-07-10 DIAGNOSIS — K219 Gastro-esophageal reflux disease without esophagitis: Secondary | ICD-10-CM

## 2023-07-10 LAB — VITAMIN B12: Vitamin B-12: 219 pg/mL (ref 211–911)

## 2023-07-10 LAB — LIPID PANEL
Cholesterol: 128 mg/dL (ref 0–200)
HDL: 51.1 mg/dL (ref >39.00)
LDL Cholesterol: 65 mg/dL (ref 0–99)
NonHDL: 76.86
Total CHOL/HDL Ratio: 3
Triglycerides: 59 mg/dL (ref 0.0–149.0)
VLDL: 11.8 mg/dL (ref 0.0–40.0)

## 2023-07-10 LAB — VITAMIN D 25 HYDROXY (VIT D DEFICIENCY, FRACTURES): VITD: 11.57 ng/mL — ABNORMAL LOW (ref 30.00–100.00)

## 2023-07-10 NOTE — Progress Notes (Signed)
 Subjective:    Patient ID: Crystal Diaz, female    DOB: [birth year], 33 y.o.   MRN: [id number]  HPI  Crystal Diaz is a very pleasant 33 y.o. female who presents today for complete physical and follow up of chronic conditions.  She would also like to update her intermittent FMLA.  She was previously on intermittent FMLA for anxiety depression episodes and doctors appointments.  She will have her employer fax over information.  Immunizations: -Tetanus: Completed in 2021  Diet: Fair diet.  Exercise: No regular exercise.  Eye exam: Completes annually  Dental exam: Completes semi-annually    Pap Smear: Completed in 08/06/2021, follows with GYN   BP Readings from Last 3 Encounters:  07/10/23 106/64  08/06/22 105/60  07/16/22 122/78      Review of Systems  Constitutional:  Negative for unexpected weight change.  HENT:  Negative for rhinorrhea.   Respiratory:  Negative for cough and shortness of breath.   Cardiovascular:  Negative for chest pain.  Gastrointestinal:  Negative for constipation and diarrhea.  Genitourinary:  Negative for difficulty urinating and menstrual problem.  Musculoskeletal:  Negative for arthralgias and myalgias.  Skin:  Negative for rash.  Allergic/Immunologic: Negative for environmental allergies.  Neurological:  Negative for dizziness, numbness and headaches.  Psychiatric/Behavioral:  The patient is not nervous/anxious.          Past Medical History:  Diagnosis Date   Anemia    Anxiety    Encounter for birth control 03/23/2018   GERD (gastroesophageal reflux disease)    Headache    Lumbar radiculopathy    PCOS (polycystic ovarian syndrome)    Plantar fasciitis, bilateral     Social History   Socioeconomic History   Marital status: Single    Spouse name: Not on file   Number of children: Not on file   Years of education: Not on file   Highest education level: 12th grade  Occupational History   Not on file  Tobacco Use    Smoking status: Former    Current packs/day: 0.00    Average packs/day: 0.3 packs/day for 4.0 years (1.0 ttl pk-yrs)    Types: Cigarettes    Start date: 01/19/2013    Quit date: 01/19/2017    Years since quitting: 6.4   Smokeless tobacco: Never  Vaping Use   Vaping status: Never Used  Substance and Sexual Activity   Alcohol use: Yes    Alcohol/week: 0.0 standard drinks of alcohol    Comment: occ   Drug use: No   Sexual activity: Yes    Partners: Male    Birth control/protection: Pill  Other Topics Concern   Not on file  Social History Narrative   Single. In a relationship.    One son.   Works as Geographical information systems officer.    Enjoys spending time with her son, traveling.   Social Drivers of Health   Financial Resource Strain: Medium Risk (07/09/2023)   Overall Financial Resource Strain (CARDIA)    Difficulty of Paying Living Expenses: Somewhat hard  Food Insecurity: No Food Insecurity (07/09/2023)   Hunger Vital Sign    Worried About Running Out of Food in the Last Year: Never true    Ran Out of Food in the Last Year: Never true  Transportation Needs: No Transportation Needs (07/09/2023)   PRAPARE - Administrator, Civil Service (Medical): No    Lack of Transportation (Non-Medical): No  Physical Activity: Insufficiently Active (07/09/2023)   Exercise  Vital Sign    Days of Exercise per Week: 3 days    Minutes of Exercise per Session: 30 min  Stress: Stress Concern Present (07/09/2023)   [employer] of Occupational Health - Occupational Stress Questionnaire    Feeling of Stress: Very much  Social Connections: Moderately Integrated (07/09/2023)   Social Connection and Isolation Panel    Frequency of Communication with Friends and Family: Twice a week    Frequency of Social Gatherings with Friends and Family: Once a week    Attends Religious Services: More than 4 times per year    Active Member of [employer] or Organizations: Yes    Attends Banker Meetings: 1 to  4 times per year    Marital Status: Never married  Intimate Partner Violence: Not on file    Past Surgical History:  Procedure Laterality Date   FOOT SURGERY Right    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 15 tablet 2   clonazePAM  (KLONOPIN ) 0.5 MG tablet Take 1 tablet (0.5 mg total) by mouth 3 (three) times daily as needed. 90 tablet 1   cyanocobalamin  (VITAMIN B12) 1000 MCG/ML injection Inject 1 mL (1,000 mcg total) into the muscle every 30 (thirty) days. 3 mL 1   desogestrel -ethinyl estradiol  (MIRCETTE ) 0.15-0.02/0.01 MG (21/5) tablet Take 1 tablet by mouth daily. 84 tablet 3   SYRINGE-NEEDLE, DISP, 3 ML (B-D 3CC LUER-LOK SYR 23GX1) 23G X 1 3 ML MISC Use once monthly with vitamin B12 as directed 6 each 0   FLUoxetine  (PROZAC ) 20 MG capsule Take 1 capsule (20 mg total) by mouth daily. (Patient not taking: Reported on 07/10/2023) 30 capsule 2   ketoconazole  (NIZORAL ) 2 % cream Apply 1 Application topically 2 (two) times daily. (Patient not taking: Reported on 07/10/2023) 60 g 0   lidocaine  (XYLOCAINE ) 5 % ointment Apply 1 Application topically as needed. (Patient not taking: Reported on 07/10/2023) 35.44 g 0   medroxyPROGESTERone  (PROVERA ) 10 MG tablet Take 1 tablet (10 mg total) by mouth daily. (Patient not taking: Reported on 07/10/2023) 10 tablet 1   pantoprazole  (PROTONIX ) 40 MG tablet Take 1 tablet (40 mg  total) by mouth daily for heartburn (Patient not taking: Reported on 07/10/2023) 90 tablet 0   Prenatal Vit-Fe Fumarate-FA (PRENATAL VITAMIN) 27-0.8 MG TABS Take 1 tablet by mouth daily. (Patient not taking: Reported on 07/10/2023) 30 tablet 11   propranolol  (INDERAL ) 10 MG tablet Take 1 tablet (10 mg total) by mouth daily. (Patient not taking: Reported on 07/10/2023) 30 tablet 2   Vitamin D , Ergocalciferol , (DRISDOL ) 1.25 MG (50000 UNIT) CAPS capsule Take 1 capsule (50,000 Units total) by mouth once a week for 12 weeks (Patient not taking: Reported on 07/10/2023) 12 capsule 0   No current facility-administered medications on file prior to visit.    BP 106/64  Pulse 85   Temp (!) 97.5 F (36.4 C) (Temporal)   Ht 5' 9 (1.753 m)   Wt 272 lb (123.4 kg)   LMP 07/02/2023 (Exact Date)   SpO2 98%   BMI 40.17 kg/m  Objective:   Physical Exam HENT:     Right Ear: Tympanic membrane and ear canal normal.     Left Ear: Tympanic membrane and ear canal normal.   Eyes:     Pupils: Pupils are equal, round, and reactive to light.    Cardiovascular:     Rate and Rhythm: Normal rate and regular rhythm.  Pulmonary:     Effort: Pulmonary effort is normal.     Breath sounds: Normal breath sounds.  Abdominal:     General: Bowel sounds are normal.     Palpations: Abdomen is soft.     Tenderness: There is no abdominal tenderness.   Musculoskeletal:        General: Normal range of motion.     Cervical back: Neck supple.   Skin:    General: Skin is warm and dry.   Neurological:     Mental Status: She is alert and oriented to person, place, and time.     Cranial Nerves: No cranial nerve deficit.     Deep Tendon Reflexes:     Reflex Scores:      Patellar reflexes are 2+ on the right side and 2+ on the left side.  Psychiatric:        Mood and Affect: Mood normal.           Assessment & Plan:  Gastroesophageal reflux disease, unspecified whether esophagitis present Assessment &  Plan: Controlled.  Continue pantoprazole  40 mg daily. She cannot tolerate lower dose.   Vitamin B12 deficiency due to intestinal malabsorption Assessment & Plan: Inconsistent use.  Resume B12 injections monthly based on B12 result. Labs pending.  Orders: -     Vitamin B12  Vitamin D  deficiency Assessment & Plan: Not currently on supplementation. Repeat vitamin D  level pending.  Orders: -     VITAMIN D  25 Hydroxy (Vit-D Deficiency, Fractures)  Preventative health care Assessment & Plan: Immunizations UTD. Pap smear UTD.  Follows with GYN  Discussed the importance of a healthy diet and regular exercise in order for weight loss, and to reduce the risk of further co-morbidity.  Exam stable. Labs pending and also reviewed.  Follow up in 1 year for repeat physical.    Class 3 severe obesity due to excess calories with body mass index (BMI) of 40.0 to 44.9 in adult -     Lipid panel  Frequent headaches Assessment & Plan: No concerns today.   GAD (generalized anxiety disorder) Assessment & Plan: Improving.  Following with psychiatry, office notes reviewed from May 2025 Continue fluoxetine  20 mg daily, Abilify  7.5 mg daily, propranolol  10 mg daily as needed.   Moderate episode of recurrent major depressive disorder [facility]) Assessment & Plan: Improving.  Following with psychiatry, office notes reviewed from May 2025 Continue fluoxetine  20 mg daily, Abilify  7.5 mg daily, propranolol  10 mg daily as needed.  Agree to complete FMLA paperwork for intermittent needs. She will have employer fax over information. We will resume the same from her prior intermittent FMLA.         Jahziah Simonin K Decari Duggar, NP

## 2023-07-10 NOTE — Patient Instructions (Signed)
 Stop by the lab prior to leaving today. I will notify you of your results once received.   It was a pleasure to see you today!

## 2023-07-10 NOTE — Assessment & Plan Note (Signed)
 Improving.  Following with psychiatry, office notes reviewed from May 2025 Continue fluoxetine  20 mg daily, Abilify  7.5 mg daily, propranolol  10 mg daily as needed.

## 2023-07-10 NOTE — Assessment & Plan Note (Signed)
 Immunizations UTD. Pap smear UTD.  Follows with GYN  Discussed the importance of a healthy diet and regular exercise in order for weight loss, and to reduce the risk of further co-morbidity.  Exam stable. Labs pending and also reviewed.  Follow up in 1 year for repeat physical.

## 2023-07-10 NOTE — Assessment & Plan Note (Signed)
 Inconsistent use.  Resume B12 injections monthly based on B12 result. Labs pending.

## 2023-07-10 NOTE — Assessment & Plan Note (Signed)
 Controlled.  Continue pantoprazole  40 mg daily. She cannot tolerate lower dose.

## 2023-07-10 NOTE — Assessment & Plan Note (Signed)
 No concerns today

## 2023-07-10 NOTE — Assessment & Plan Note (Addendum)
 Improving.  Following with psychiatry, office notes reviewed from May 2025 Continue fluoxetine  20 mg daily, Abilify  7.5 mg daily, propranolol  10 mg daily as needed.  Agree to complete FMLA paperwork for intermittent needs. She will have employer fax over information. We will resume the same from her prior intermittent FMLA.

## 2023-07-10 NOTE — Assessment & Plan Note (Signed)
Not currently on supplementation.  Repeat vitamin D level pending.

## 2023-07-13 ENCOUNTER — Ambulatory Visit: Admitting: Professional

## 2023-07-13 ENCOUNTER — Encounter: Payer: Self-pay | Admitting: Professional

## 2023-07-13 ENCOUNTER — Ambulatory Visit: Payer: Self-pay | Admitting: Primary Care

## 2023-07-13 DIAGNOSIS — F411 Generalized anxiety disorder: Secondary | ICD-10-CM

## 2023-07-13 NOTE — Progress Notes (Signed)
 [facility] Counselor/Therapist Progress Note   Patient ID: Crystal Diaz, MRN: [id number],     Date: 07/13/2023   Time Spent: 2 minutes 1205-1207pm   Treatment Type: Individual Therapy   Risk Assessment: Danger to Self:  No Self-injurious Behavior: No Danger to Others: No   Subjective: This session was held via video teletherapy. The patient consented to video teletherapy and was located in her home during this session. She is aware it is the responsibility of the patient to secure confidentiality on her end of the session. The provider was in a private home office for the duration of this session.    The patient arrived on time for her Caregility appointment.    Issues addressed: 1-Clinician error-pt visit was to be at one month -pt doing well with no changes -off for long weekend -will see pt on July 28th for next visit   Treatment Plan Problems: Unipolar Depression, Anxiety, Chronic Pain, Grief / Loss Unresolved Symptoms: Excessive and/or unrealistic worry that is difficult to control occurring more days than not for at least 6 months about a number of events or activities. Hypervigilance (e.g., feeling constantly on edge, experiencing concentration difficulties, having trouble falling or staying asleep, exhibiting a general state of irritability). Experiences pain beyond the normal healing process (six months or more) that significantly limits physical activities. Uses increased amounts of medications with little, if any, pain relief. Experiences back or neck pain, interstitial cystitis, or diabetic neuropathy. Has decreased or stopped activities such as work, household chores, socializing, exercise, sex, or other pleasurable activities because of pain. Experiences an increase in general physical discomfort (e.g., fatigue, night sweats, insomnia, muscle tension, body aches). Exhibits signs and symptoms of depression. Makes many complaintive, depressive  statements like I can't do what I used to; No one understands me; Why me?; When will this go away?; I can't take this pain anymore; and I can't go on. Thoughts dominated by loss coupled with poor concentration, tearful spells, and confusion about the future. Serial losses in life (i.e., deaths, divorces, jobs) that led to depression and discouragement. Lack of appetite, weight loss, and/or insomnia as well as other depression signs that occurred since the loss. Strong emotional response of sadness exhibited when losses are discussed. Feelings of guilt that not enough was done for the lost significant other, or an unreasonable belief of having contributed to the death of the significant other. Depressed or irritable mood. Decrease or loss of appetite. Diminished interest in or enjoyment of activities. Psychomotor agitation or retardation. Sleeplessness or hypersomnia. Lack of energy. Social withdrawal. Feelings of hopelessness, worthlessness, or inappropriate guilt. Low self-esteem. Unresolved grief issues. History of chronic or recurrent depression for which the client has taken antidepressant medication, been hospitalized, had outpatient treatment, or had a course of electroconvulsive therapy. Goals: Reduce overall frequency, intensity, and duration of the anxiety so that daily functioning is not impaired. Stabilize anxiety level while increasing ability to function on a daily basis. Resolve the core conflict that is the source of anxiety. Enhance ability to effectively cope with the full variety of life's worries and anxieties. Learn and implement coping skills that result in a reduction of anxiety and worry, and improved daily functioning. Acquire and utilize the necessary pain management skills. Find relief from pain and build renewed contentment and joy in performing activities of everyday life. Begin a healthy grieving process around the loss. Develop an awareness of how  the avoidance of grieving has affected life and begin the healing  process. Complete the process of letting go of the lost significant other. Alleviate depressive symptoms and return to previous level of effective functioning. Recognize, accept, and cope with feelings of depression. Develop healthy thinking patterns and beliefs about self, others, and the world that lead to the alleviation and help prevent the relapse of depression. Develop healthy interpersonal relationships that lead to the alleviation and help prevent the relapse of depression. Appropriately grieve the loss in order to normalize mood and to return to previously adaptive level of functioning. Objectives target date for all objectives is 03/01/2024 Describe situations, thoughts, feelings, and actions associated with anxieties and worries, their impact on functioning, and attempts to resolve them. Verbalize an understanding of the cognitive, physiological, and behavioral components of anxiety and its treatment. Learn and implement calming skills to reduce overall anxiety and manage anxiety symptoms. Verbalize an understanding of the role that cognitive biases play in excessive irrational worry and persistent anxiety symptoms. Identify, challenge, and replace biased, fearful self-talk with positive, realistic, and empowering self-talk. Learn and implement problem-solving strategies for realistically addressing worries. Identify and engage in pleasant activities on a daily basis. Describe the nature of, history of, impact of, and understood causes of chronic pain. Identify and monitor specific pain triggers. Learn and implement calming skills such as relaxation, biofeedback, or mindfulness meditation to ease pain. Identify, challenge, and change maladaptive thoughts and beliefs about pain and pain management and replace them with more adaptive thoughts and beliefs. Learn and implement specific coping skills as well as when and how to  use them to manage pain and its consequences. Engage in positive self-talk as an alternative to the depressing, negative thoughts about self and the world. Tell in detail the story of the current loss that is triggering symptoms. Identify what stages of grief have been experienced in the continuum of the grieving process. Begin verbalizing feelings associated with the loss. Attend a grief/loss support group. Identify how avoiding dealing with loss has negatively impacted life. Decrease unrealistic thoughts, statements, and feelings of being responsible for the loss. Express thoughts and feelings about the deceased that went unexpressed while the deceased was alive. Report decreased time spent each day focusing on the loss. Describe current and past experiences with depression including their impact on functioning and attempts to resolve it. Verbalize an accurate understanding of depression. Identify and replace thoughts and beliefs that support depression. Learn and implement behavioral strategies to overcome depression. Identify important people in life, past and present, and describe the quality, good and poor, of those relationships. Learn and implement problem-solving and decision-making skills. Learn and implement conflict resolution skills to resolve interpersonal problems. Learn and implement relapse prevention skills. Interventions: Use empathy, compassion, and support, allowing the client to tell in detail the story of his/her recent loss. Ask the client to elaborate in an autobiography the circumstances, feelings, and effects of the losses in him/her; assess the characteristics of the loss (e.g., type, suddenness, trauma), previous functioning, current functioning, and coping style. Ask the client to list ways that avoidance of grieving has negatively impacted his/her life. Use a cognitive therapy approach to identify the client's bias toward thoughts of personal responsibility for  the loss and replace them with factual, reality-based thoughts (or assign Negative Thoughts Trigger Negative Feelings in the Adult Psychotherapy Homework Planner by Jenniffer). Conduct an empty-chair exercise with the client where he/she focuses on expressing to the lost loved one imagined in the chair what he/she never said while that loved one was alive.  Assign the client to visit the grave of the lost loved one to talk to the deceased and express his/her feelings. Ask the client to write a letter to the lost person describing his/her fond memories and/or painful and regretful memories, and how he/she currently feels life (or assign Dear _____: A Letter to a Lost Loved One in the Adult Psychotherapy Homework Planner by [facility]); process the letter in session. Assign the client to write to the deceased loved one with a special focus on his/her feelings associated with the last meaningful contact with that person. Suggest that the client set aside a specific time-limited period each day to focus on mourning his/her loss. After each day's time is up, the client will resume regular activities and postpone grieving thoughts until the next scheduled time. For example, mourning times could include putting on dark clothing and/or sad music; clothing would be changed when the allotted time is up. Educate the client on the stages of the grieving process and answer any questions he/she may have. Assist the client in identifying the stages of grief that he/she has experienced and which stage he/she is presently working through. Ask the client to bring pictures or mementos connected with his/her loss to a session and talk about them (or assign Creating a Patent examiner in the Adult Psychotherapy Administrator, arts by Jenniffer). Assist the client in identifying and expressing feelings connected with his/her loss. Ask the client to attend a grief/loss support group and report to the therapist how he/she felt about  attending. Ask the client to describe his/her past experiences of anxiety and their impact on functioning; assess the focus, excessiveness, and uncontrollability of the worry and the type, frequency, intensity, and duration of his/her anxiety symptoms (consider using a structured interview such as The Anxiety Disorders Interview Schedule-Adult Version). Explore the client's schema and self-talk that mediate his/her fear response; assist him/her in challenging the biases; replace the distorted messages with reality-based alternatives and positive, realistic self-talk that will increase his/her self-confidence in coping with irrational fears (see Cognitive Therapy of Anxiety Disorders by Gretta armin Mon). Teach the client problem-solving strategies involving specifically defining a problem, generating options for addressing it, evaluating the pros and cons of each option, selecting and implementing an optional action, and reevaluating and refining the action (or assign Applying Problem-Solving to Interpersonal Conflict in the Adult Psychotherapy Homework Planner by Jenniffer). Engage the client in behavioral activation, increasing the client's contact with sources of reward, identifying processes that inhibit activation, and teaching skills to solve life problems (or assign Identify and Schedule Pleasant Activities in the Adult Psychotherapy Homework Planner by Jenniffer); use behavioral techniques such as instruction, rehearsal, role-playing, role reversal as needed to assist adoption into the client's daily life; reinforce success. Discuss how generalized anxiety typically involves excessive worry about unrealistic threats, various bodily expressions of tension, overarousal, and hypervigilance, and avoidance of what is threatening that interact to maintain the problem (see Mastery of Your Anxiety and Worry: Therapist Guide by Venson River, and Barlow; Treating Generalized Anxiety Disorder by Rygh and  Red). Discuss how treatment targets worry, anxiety symptoms, and avoidance to help the client manage worry effectively, reduce overarousal, and eliminate unnecessary avoidance. Teach the client calming/relaxation skills (e.g., applied relaxation, progressive muscle relaxation, cue controlled relaxation; mindful breathing; biofeedback) and how to discriminate better between relaxation and tension; teach the client how to apply these skills to his/her daily life (e.g., New Directions in Progressive Muscle Relaxation by Thornell Collier, and Hazlett-Stevens; Treating Generalized Anxiety  Disorder by Rygh and Red). Assign the client to read about progressive muscle relaxation and other calming strategies in relevant books or treatment manuals (e.g., Progressive Relaxation Training by Thornell and Elmer; Mastery of Your Anxiety and Worry: Workbook by Richarda armin Given). Assist the client in analyzing his/her worries by examining potential biases such as the probability of the negative expectation occurring, the real consequences of it occurring, his/her ability to control the outcome, the worst possible outcome, and his/her ability to accept it (see Analyze the Probability of a Feared Event in the Adult Psychotherapy Homework Planner by Jenniffer; Cognitive Therapy of Anxiety Disorders by Gretta armin Mon). Discuss examples demonstrating that unrealistic worry typically overestimates the probability of threats and underestimates or overlooks the client's ability to manage realistic demands (or assign Past Successful Anxiety Coping in the Adult Psychotherapy Homework Planner by Jenniffer). Encourage the client to share his/her thoughts and feelings of depression; express empathy and build rapport while identifying primary cognitive, behavioral, interpersonal, or other contributors to depression. Assign the client to self-monitor thoughts, feelings, and actions in daily journal (e.g., Negative  Thoughts Trigger Negative Feelings in the Adult Psychotherapy Homework Planner by Jenniffer; Daily Record of Dysfunctional Thoughts in Cognitive Therapy of Depression by Mon Candida Gentry and Shona); process the journal material to challenge depressive thinking patterns and replace them with reality-based thoughts. Facilitate and reinforce the client's shift from biased depressive self-talk and beliefs to reality-based cognitive messages that enhance self-confidence and increase adaptive actions (see Positive Self-Talk in the Adult Psychotherapy Homework Planner by Jenniffer). Engage the client in behavioral activation, increasing his/her activity level and contact with sources of reward, while identifying processes that inhibit activation (see Behavioral Activation for Depression by Loleta, Dimidjian, and Herman-Dunn; or assign Identify and Schedule Pleasant Activities in the Adult Psychotherapy Homework Planner by [facility]); use behavioral techniques such as instruction, rehearsal, role-playing, role reversal, as needed, to facilitate activity in the client's daily life; reinforce success. Conduct Interpersonal Therapy (see Interpersonal Psychotherapy of Depression by Anne dunker al.), beginning with the assessment of the client's interpersonal inventory of important past and present relationships; develop a case formulation linking depression to grief, interpersonal role disputes, role transitions, and/or interpersonal deficits). Assign behavioral experiments in which depressive automatic thoughts are treated as hypotheses/prediction, reality-based alternative hypotheses/prediction are generated, and both are tested against the client's past, present, and/or future experiences. Conduct Problem-Solving Therapy (see Problem-Solving Therapy by Francisco and Nezu) using techniques such as psychoeducation, modeling, and role-playing to teach client problem-solving skills (i.e., defining a problem  specifically, generating possible solutions, evaluating the pros and cons of each solution, selecting and implementing a plan of action, evaluating the efficacy of the plan, accepting or revising the plan); role-play application of the problem-solving skill to a real life issue (or assign Applying Problem-Solving to Interpersonal Conflict in the Adult Psychotherapy Homework Planner by Jenniffer). Teach conflict resolution skills (e.g., empathy, active listening, I messages, respectful communication, assertiveness without aggression, compromise); use psychoeducation, modeling, role-playing, and rehearsal to work through several current conflicts; assign homework exercises; review and repeat so as to integrate their use into the client's life. Discuss with the client the distinction between a lapse and relapse, associating a lapse with a rather common, temporary setback that may involve, for example, re-experiencing a depressive thought and/or urge to withdraw or avoid (perhaps as related to some loss or conflict) and a relapse as a sustained return to a pattern of depressive thinking and feeling usually accompanied by interpersonal withdrawal and/or avoidance.  Identify and rehearse with the client the management of future situations or circumstances in which lapses could occur. Consistent with the treatment model, discuss how cognitive, behavioral, interpersonal, and/or other factors (e.g., family history) contribute to depression. Assess the manifestation of chronic pain, its history, current status, triggers, and methods of coping (see The Handbook of Pain Assessment by Corinne armin Sow). Teach the client self-monitoring of his/her symptoms; ask the client to keep a pain journal that records time of day, where and what he/she was doing, the severity of stress at the time, the severity of, and what was done to alleviate the pain (or assign Pain and Stress Journal in the Adult Psychotherapy Homework Planner  by [facility]); process the journal with the client to increase understanding of the nature of the pain, cognitive, affective, and behavioral triggers, and the positive or negative effects of the coping strategies he/she is currently using. Teach the client relaxation skills (e.g., progressive muscle relaxation, guided imagery, slow diaphragmatic breathing) or mindfulness meditation, explaining the rationale and how to apply these skills to his/her daily life (see New Directions in Progressive Muscle Relaxation by Thornell Collier, and Hazlett-Stevens). Assign the client to read about progressive muscle relaxation and other calming strategies in relevant books or treatment manuals (e.g., The Relaxation and Stress Reduction Workbook by Nicholaus Aw, and [place]; Living Beyond Your Pain by Doreene and [place]). Explore the client's schema and self-talk that mediate his/her pain response, challenging the biases, assisting him/her in generating thoughts that correct for the biases, facilitate coping, and build confidence in managing pain. Use cognitive therapy techniques to help the client change his/her view of their pain and suffering from overwhelming to manageable. Teach the client specific coping skills based on an assessment of need (e.g., problem-solving, social/communication, conflict resolution, goal-setting). Assist the client in reframing thoughts about his/her life as one that has many positive elements outside of the pain; ask him/her to list positive aspects of himself/herself as well as his life circumstances (or assign Positive Self-Talk and/or What's Good about Me and My Life? in the Adult Psychotherapy Homework Planner by Jenniffer).   Diagnosis:MDD (major depressive disorder), recurrent episode, moderate (HCC)   Generalized anxiety disorder   Plan:  -next session will Monday, July 28th, 2025 at 12pm virtual.

## 2023-07-15 ENCOUNTER — Ambulatory Visit (INDEPENDENT_AMBULATORY_CARE_PROVIDER_SITE_OTHER): Admitting: Obstetrics and Gynecology

## 2023-07-15 ENCOUNTER — Encounter: Payer: Self-pay | Admitting: Obstetrics and Gynecology

## 2023-07-15 VITALS — BP 112/74 | HR 92 | Ht 69.0 in | Wt 275.2 lb

## 2023-07-15 DIAGNOSIS — Z3202 Encounter for pregnancy test, result negative: Secondary | ICD-10-CM | POA: Diagnosis not present

## 2023-07-15 DIAGNOSIS — N912 Amenorrhea, unspecified: Secondary | ICD-10-CM | POA: Diagnosis not present

## 2023-07-15 DIAGNOSIS — Z01419 Encounter for gynecological examination (general) (routine) without abnormal findings: Secondary | ICD-10-CM

## 2023-07-15 DIAGNOSIS — Z3200 Encounter for pregnancy test, result unknown: Secondary | ICD-10-CM

## 2023-07-15 LAB — POCT URINE PREGNANCY: Preg Test, Ur: NEGATIVE

## 2023-07-15 NOTE — Progress Notes (Signed)
 HPI:      [name] is a 33 y.o. G1P1001 who LMP was Patient's last menstrual period was 07/02/2023 (exact date).  Subjective:   She presents today stating that she has been attempting pregnancy since March.  It was then that she stopped her OCPs.  She has been having normal cycles approximately 28 days long.  She skipped last month but has most recently had a menstrual period beginning on June 20. She has been experiencing some irregular pelvic pain over the last 4 days. She says that with her last pregnancy she got pregnant on Depo-Provera  and never had a menstrual cycle.  She has some concern that she might be pregnant now.    Hx: The following portions of the patient's history were reviewed and updated as appropriate:             She  has a past medical history of Anemia, Anxiety, Encounter for birth control (03/23/2018), GERD (gastroesophageal reflux disease), Headache, Lumbar radiculopathy, PCOS (polycystic ovarian syndrome), and Plantar fasciitis, bilateral. She does not have any pertinent problems on file. 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 Her family history includes Cancer in her brother; Diabetes in her mother; Mental illness in her brother; Pancreatic cancer (age of onset: 58) in her father; Schizophrenia in her brother. She  reports that she quit smoking about 6 years ago. Her smoking use included cigarettes. She started smoking about 10 years ago. She has a 1 pack-year smoking history. She has never used smokeless tobacco. She reports current alcohol use. She reports that she does not use drugs. She has a current medication list which includes the following prescription(s): clonazepam , aripiprazole , cyanocobalamin , desogestrel -ethinyl estradiol , fluoxetine , ketoconazole , lidocaine , medroxyprogesterone , pantoprazole , prenatal vitamin, propranolol , b-d 3cc luer-lok syr 23gx1, and  vitamin d  (ergocalciferol ). She has no known allergies.       Review of Systems:  Review of Systems  Constitutional: Denied constitutional symptoms, night sweats, recent illness, fatigue, fever, insomnia and weight loss.  Eyes: Denied eye symptoms, eye pain, photophobia, vision change and visual disturbance.  Ears/Nose/Throat/Neck: Denied ear, nose, throat or neck symptoms, hearing loss, nasal discharge, sinus congestion and sore throat.  Cardiovascular: Denied cardiovascular symptoms, arrhythmia, chest pain/pressure, edema, exercise intolerance, orthopnea and palpitations.  Respiratory: Denied pulmonary symptoms, asthma, pleuritic pain, productive sputum, cough, dyspnea and wheezing.  Gastrointestinal: Denied, gastro-esophageal reflux, melena, nausea and vomiting.  Genitourinary: Denied genitourinary symptoms including symptomatic vaginal discharge, pelvic relaxation issues, and urinary complaints.  Musculoskeletal: Denied musculoskeletal symptoms, stiffness, swelling, muscle weakness and myalgia.  Dermatologic: Denied dermatology symptoms, rash and scar.  Neurologic: Denied neurology symptoms, dizziness, headache, neck pain and syncope.  Psychiatric: Denied psychiatric symptoms, anxiety and depression.  Endocrine: Denied endocrine symptoms including hot flashes and night sweats.   Meds:   Current Outpatient Medications on File Prior to Visit  Medication Sig Dispense Refill   clonazePAM  (KLONOPIN ) 0.5 MG tablet Take 1 tablet (0.5 mg total) by mouth 3 (three) times daily as needed. 90 tablet 1   ARIPiprazole  (ABILIFY ) 15 MG tablet Take 0.5 tablets (7.5 mg total) by mouth daily. (Patient not taking: Reported on 07/15/2023) 15 tablet 2   cyanocobalamin  (VITAMIN B12) 1000 MCG/ML injection Inject 1 mL (1,000 mcg total) into the muscle every 30 (thirty) days. (Patient not taking: Reported on 07/15/2023) 3 mL 1   desogestrel -ethinyl estradiol  (MIRCETTE ) 0.15-0.02/0.01 MG (21/5) tablet Take 1 tablet  by mouth daily. (  Patient not taking: Reported on 07/15/2023) 84 tablet 3   FLUoxetine  (PROZAC ) 20 MG capsule Take 1 capsule (20 mg total) by mouth daily. (Patient not taking: Reported on 07/15/2023) 30 capsule 2   ketoconazole  (NIZORAL ) 2 % cream Apply 1 Application topically 2 (two) times daily. (Patient not taking: Reported on 07/15/2023) 60 g 0   lidocaine  (XYLOCAINE ) 5 % ointment Apply 1 Application topically as needed. (Patient not taking: Reported on 07/15/2023) 35.44 g 0   medroxyPROGESTERone  (PROVERA ) 10 MG tablet Take 1 tablet (10 mg total) by mouth daily. (Patient not taking: Reported on 07/15/2023) 10 tablet 1   pantoprazole  (PROTONIX ) 40 MG tablet Take 1 tablet (40 mg total) by mouth daily for heartburn (Patient not taking: Reported on 07/15/2023) 90 tablet 0   Prenatal Vit-Fe Fumarate-FA (PRENATAL VITAMIN) 27-0.8 MG TABS Take 1 tablet by mouth daily. (Patient not taking: Reported on 07/15/2023) 30 tablet 11   propranolol  (INDERAL ) 10 MG tablet Take 1 tablet (10 mg total) by mouth daily. (Patient not taking: Reported on 07/15/2023) 30 tablet 2   SYRINGE-NEEDLE, DISP, 3 ML (B-D 3CC LUER-LOK SYR 23GX1) 23G X 1 3 ML MISC Use once monthly with vitamin B12 as directed (Patient not taking: Reported on 07/15/2023) 6 each 0   Vitamin D , Ergocalciferol , (DRISDOL ) 1.25 MG (50000 UNIT) CAPS capsule Take 1 capsule (50,000 Units total) by mouth once a week for 12 weeks (Patient not taking: Reported on 07/15/2023) 12 capsule 0   No current facility-administered medications on file prior to visit.      Objective:     Vitals:   07/15/23 0901  BP: 112/74  Pulse: 92   Filed Weights   07/15/23 0901  Weight: 275 lb 3.2 oz (124.8 kg)                        Assessment:    G1P1001 Patient Active Problem List   Diagnosis Date Noted   Frequent headaches 09/06/2021   High risk medication use 08/02/2021   MDD (major depressive disorder) 04/26/2021   Vitamin D  deficiency 06/08/2020   Abnormal uterine bleeding  12/02/2019   Screening for STD (sexually transmitted disease) 10/19/2018   GERD (gastroesophageal reflux disease) 03/23/2018   Preventative health care 03/23/2018   Vitamin B12 deficiency due to intestinal malabsorption 12/16/2017   GAD (generalized anxiety disorder) 11/25/2017   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 Possible pregnancy, not confirmed     Very likely not pregnant based on her cycle June 20.   Plan:            1.  Patient desires urinary beta-hCG  2.  Discussed timing of intercourse and use of cycle tracking and ovulation predictor kits.  All questions answered.  Orders Orders Placed This Encounter  Procedures   POCT urine pregnancy    No orders of the defined types were placed in this encounter.     F/U  Return in about 6 months (around 01/15/2024) for Pt to contact us  if symptoms worsen.  Alm DOROTHA Sar, M.D. 07/15/2023 10:13 AM

## 2023-07-20 ENCOUNTER — Other Ambulatory Visit: Payer: Self-pay

## 2023-07-20 DIAGNOSIS — M4727 Other spondylosis with radiculopathy, lumbosacral region: Secondary | ICD-10-CM

## 2023-07-21 ENCOUNTER — Telehealth: Payer: Self-pay

## 2023-07-21 DIAGNOSIS — E66812 Obesity, class 2: Secondary | ICD-10-CM | POA: Diagnosis not present

## 2023-07-21 DIAGNOSIS — Z9884 Bariatric surgery status: Secondary | ICD-10-CM | POA: Diagnosis not present

## 2023-07-21 NOTE — Telephone Encounter (Signed)
  FMLA for Patient forms received for completion for patient. Patient has been informed that process may take up to 5 business days.  Employer Name [employer] Reason for being out: Mental Health Any inpatient care: N/A Any Planned appointment? 7.11.25 Patient is requesting start date of 7.2.25 Patient is requesting end date of 7.2.26  Frequency: 2 episodes per month  Duration: 1 day  Verified with patient that it is ok to leave Voicemail updates on  Mobile [phone] (mobile)  Patient would like to pick up copy in our office when form is completed.   Fax number form should be sent to is [phone]  Forms placed in providers box for review.

## 2023-07-21 NOTE — Telephone Encounter (Signed)
 Completed and placed on Erin's desk.

## 2023-07-22 NOTE — Telephone Encounter (Signed)
 Received and faxed completed forms to [phone] Copy sent to scan. Copy mailed to patient as requested.

## 2023-07-24 ENCOUNTER — Encounter: Payer: 59 | Admitting: Primary Care

## 2023-07-25 ENCOUNTER — Ambulatory Visit
Admission: RE | Admit: 2023-07-25 | Discharge: 2023-07-25 | Disposition: A | Discharge status: Home / Self Care | Source: Ambulatory Visit

## 2023-07-25 DIAGNOSIS — M4727 Other spondylosis with radiculopathy, lumbosacral region: Secondary | ICD-10-CM

## 2023-07-25 DIAGNOSIS — M5127 Other intervertebral disc displacement, lumbosacral region: Secondary | ICD-10-CM | POA: Diagnosis not present

## 2023-07-25 DIAGNOSIS — M51379 Other intervertebral disc degeneration, lumbosacral region without mention of lumbar back pain or lower extremity pain: Secondary | ICD-10-CM | POA: Diagnosis not present

## 2023-07-25 HISTORY — DX: Depression, unspecified: F32.A

## 2023-08-07 ENCOUNTER — Other Ambulatory Visit

## 2023-08-10 ENCOUNTER — Encounter: Payer: Self-pay | Admitting: Professional

## 2023-08-10 ENCOUNTER — Ambulatory Visit (INDEPENDENT_AMBULATORY_CARE_PROVIDER_SITE_OTHER): Admitting: Professional

## 2023-08-10 DIAGNOSIS — F331 Major depressive disorder, recurrent, moderate: Secondary | ICD-10-CM

## 2023-08-10 DIAGNOSIS — F411 Generalized anxiety disorder: Secondary | ICD-10-CM

## 2023-08-10 NOTE — Progress Notes (Signed)
 [facility] Counselor/Therapist Progress Note   Patient ID: Crystal Diaz, MRN: [id number],     Date: 08/10/2023   Time Spent: 30 minutes 1154-1224pm   Treatment Type: Individual Therapy   Risk Assessment: Danger to Self:  No Self-injurious Behavior: No Danger to Others: No   Subjective: This session was held via video teletherapy. The patient consented to video teletherapy and was located in her home during this session. She is aware it is the responsibility of the patient to secure confidentiality on her end of the session. The provider was in a private home office for the duration of this session.    The patient arrived early for her Caregility appointment.    Issues addressed: 1-mood -okay mood, not normal, overwhelmed, out of control feeling 2-son Jane -has been having some issues -he was at home and called her to ask if friend could come over and she declined since grandmother was picking him up -he kicked in a glass door from inside of the home not from outside   -she suspects it was because he was  -she called paternal grandmother's home that pt will not come to get him; he called her and told her she was not picking him up  -she told her his father needs to understand that she is not tolerating his behavior and help me -this happened July 4th and her son's father tried to return him at 1am and father called police who tried to get her to answer the door -son has called that he wants to come home and that they don't like him there -struggling to cope with his behaviors, what he has done, and what she has done  -since he is with his father she has been trying to get herself to a better place -how to establish a routine to follow all week -issues with her son holds the weight of everything -her relationship is much better and bf is spending a lot more time with her  Treatment Plan Problems: Unipolar Depression, Anxiety, Chronic Pain, Grief / Loss  Unresolved Symptoms: Excessive and/or unrealistic worry that is difficult to control occurring more days than not for at least 6 months about a number of events or activities. Hypervigilance (e.g., feeling constantly on edge, experiencing concentration difficulties, having trouble falling or staying asleep, exhibiting a general state of irritability). Experiences pain beyond the normal healing process (six months or more) that significantly limits physical activities. Uses increased amounts of medications with little, if any, pain relief. Experiences back or neck pain, interstitial cystitis, or diabetic neuropathy. Has decreased or stopped activities such as work, household chores, socializing, exercise, sex, or other pleasurable activities because of pain. Experiences an increase in general physical discomfort (e.g., fatigue, night sweats, insomnia, muscle tension, body aches). Exhibits signs and symptoms of depression. Makes many complaintive, depressive statements like I can't do what I used to; No one understands me; Why me?; When will this go away?; I can't take this pain anymore; and I can't go on. Thoughts dominated by loss coupled with poor concentration, tearful spells, and confusion about the future. Serial losses in life (i.e., deaths, divorces, jobs) that led to depression and discouragement. Lack of appetite, weight loss, and/or insomnia as well as other depression signs that occurred since the loss. Strong emotional response of sadness exhibited when losses are discussed. Feelings of guilt that not enough was done for the lost significant other, or an unreasonable belief of having contributed to the death of the significant  other. Depressed or irritable mood. Decrease or loss of appetite. Diminished interest in or enjoyment of activities. Psychomotor agitation or retardation. Sleeplessness or hypersomnia. Lack of energy. Social withdrawal. Feelings of hopelessness,  worthlessness, or inappropriate guilt. Low self-esteem. Unresolved grief issues. History of chronic or recurrent depression for which the client has taken antidepressant medication, been hospitalized, had outpatient treatment, or had a course of electroconvulsive therapy. Goals: Reduce overall frequency, intensity, and duration of the anxiety so that daily functioning is not impaired. Stabilize anxiety level while increasing ability to function on a daily basis. Resolve the core conflict that is the source of anxiety. Enhance ability to effectively cope with the full variety of life's worries and anxieties. Learn and implement coping skills that result in a reduction of anxiety and worry, and improved daily functioning. Acquire and utilize the necessary pain management skills. Find relief from pain and build renewed contentment and joy in performing activities of everyday life. Begin a healthy grieving process around the loss. Develop an awareness of how the avoidance of grieving has affected life and begin the healing process. Complete the process of letting go of the lost significant other. Alleviate depressive symptoms and return to previous level of effective functioning. Recognize, accept, and cope with feelings of depression. Develop healthy thinking patterns and beliefs about self, others, and the world that lead to the alleviation and help prevent the relapse of depression. Develop healthy interpersonal relationships that lead to the alleviation and help prevent the relapse of depression. Appropriately grieve the loss in order to normalize mood and to return to previously adaptive level of functioning. Objectives target date for all objectives is 03/01/2024 Describe situations, thoughts, feelings, and actions associated with anxieties and worries, their impact on functioning, and attempts to resolve them. Verbalize an understanding of the cognitive, physiological, and behavioral  components of anxiety and its treatment. Learn and implement calming skills to reduce overall anxiety and manage anxiety symptoms. Verbalize an understanding of the role that cognitive biases play in excessive irrational worry and persistent anxiety symptoms. Identify, challenge, and replace biased, fearful self-talk with positive, realistic, and empowering self-talk. Learn and implement problem-solving strategies for realistically addressing worries. Identify and engage in pleasant activities on a daily basis. Describe the nature of, history of, impact of, and understood causes of chronic pain. Identify and monitor specific pain triggers. Learn and implement calming skills such as relaxation, biofeedback, or mindfulness meditation to ease pain. Identify, challenge, and change maladaptive thoughts and beliefs about pain and pain management and replace them with more adaptive thoughts and beliefs. Learn and implement specific coping skills as well as when and how to use them to manage pain and its consequences. Engage in positive self-talk as an alternative to the depressing, negative thoughts about self and the world. Tell in detail the story of the current loss that is triggering symptoms. Identify what stages of grief have been experienced in the continuum of the grieving process. Begin verbalizing feelings associated with the loss. Attend a grief/loss support group. Identify how avoiding dealing with loss has negatively impacted life. Decrease unrealistic thoughts, statements, and feelings of being responsible for the loss. Express thoughts and feelings about the deceased that went unexpressed while the deceased was alive. Report decreased time spent each day focusing on the loss. Describe current and past experiences with depression including their impact on functioning and attempts to resolve it. Verbalize an accurate understanding of depression. Identify and replace thoughts and beliefs  that support depression. Learn and  implement behavioral strategies to overcome depression. Identify important people in life, past and present, and describe the quality, good and poor, of those relationships. Learn and implement problem-solving and decision-making skills. Learn and implement conflict resolution skills to resolve interpersonal problems. Learn and implement relapse prevention skills. Interventions: Use empathy, compassion, and support, allowing the client to tell in detail the story of his/her recent loss. Ask the client to elaborate in an autobiography the circumstances, feelings, and effects of the losses in him/her; assess the characteristics of the loss (e.g., type, suddenness, trauma), previous functioning, current functioning, and coping style. Ask the client to list ways that avoidance of grieving has negatively impacted his/her life. Use a cognitive therapy approach to identify the client's bias toward thoughts of personal responsibility for the loss and replace them with factual, reality-based thoughts (or assign Negative Thoughts Trigger Negative Feelings in the Adult Psychotherapy Homework Planner by Jenniffer). Conduct an empty-chair exercise with the client where he/she focuses on expressing to the lost loved one imagined in the chair what he/she never said while that loved one was alive. Assign the client to visit the grave of the lost loved one to talk to the deceased and express his/her feelings. Ask the client to write a letter to the lost person describing his/her fond memories and/or painful and regretful memories, and how he/she currently feels life (or assign Dear _____: A Letter to a Lost Loved One in the Adult Psychotherapy Homework Planner by [facility]); process the letter in session. Assign the client to write to the deceased loved one with a special focus on his/her feelings associated with the last meaningful contact with that person. Suggest that the client  set aside a specific time-limited period each day to focus on mourning his/her loss. After each day's time is up, the client will resume regular activities and postpone grieving thoughts until the next scheduled time. For example, mourning times could include putting on dark clothing and/or sad music; clothing would be changed when the allotted time is up. Educate the client on the stages of the grieving process and answer any questions he/she may have. Assist the client in identifying the stages of grief that he/she has experienced and which stage he/she is presently working through. Ask the client to bring pictures or mementos connected with his/her loss to a session and talk about them (or assign Creating a Patent examiner in the Adult Psychotherapy Administrator, arts by Jenniffer). Assist the client in identifying and expressing feelings connected with his/her loss. Ask the client to attend a grief/loss support group and report to the therapist how he/she felt about attending. Ask the client to describe his/her past experiences of anxiety and their impact on functioning; assess the focus, excessiveness, and uncontrollability of the worry and the type, frequency, intensity, and duration of his/her anxiety symptoms (consider using a structured interview such as The Anxiety Disorders Interview Schedule-Adult Version). Explore the client's schema and self-talk that mediate his/her fear response; assist him/her in challenging the biases; replace the distorted messages with reality-based alternatives and positive, realistic self-talk that will increase his/her self-confidence in coping with irrational fears (see Cognitive Therapy of Anxiety Disorders by Gretta armin Mon). Teach the client problem-solving strategies involving specifically defining a problem, generating options for addressing it, evaluating the pros and cons of each option, selecting and implementing an optional action, and reevaluating and  refining the action (or assign Applying Problem-Solving to Interpersonal Conflict in the Adult Psychotherapy Homework Planner by Jenniffer). Engage the client in  behavioral activation, increasing the client's contact with sources of reward, identifying processes that inhibit activation, and teaching skills to solve life problems (or assign Identify and Schedule Pleasant Activities in the Adult Psychotherapy Homework Planner by Jenniffer); use behavioral techniques such as instruction, rehearsal, role-playing, role reversal as needed to assist adoption into the client's daily life; reinforce success. Discuss how generalized anxiety typically involves excessive worry about unrealistic threats, various bodily expressions of tension, overarousal, and hypervigilance, and avoidance of what is threatening that interact to maintain the problem (see Mastery of Your Anxiety and Worry: Therapist Guide by Venson River, and Barlow; Treating Generalized Anxiety Disorder by Rygh and Red). Discuss how treatment targets worry, anxiety symptoms, and avoidance to help the client manage worry effectively, reduce overarousal, and eliminate unnecessary avoidance. Teach the client calming/relaxation skills (e.g., applied relaxation, progressive muscle relaxation, cue controlled relaxation; mindful breathing; biofeedback) and how to discriminate better between relaxation and tension; teach the client how to apply these skills to his/her daily life (e.g., New Directions in Progressive Muscle Relaxation by Thornell Collier, and Hazlett-Stevens; Treating Generalized Anxiety Disorder by Rygh and Red). Assign the client to read about progressive muscle relaxation and other calming strategies in relevant books or treatment manuals (e.g., Progressive Relaxation Training by Thornell and Collier; Mastery of Your Anxiety and Worry: Workbook by River armin Given). Assist the client in analyzing his/her worries by examining  potential biases such as the probability of the negative expectation occurring, the real consequences of it occurring, his/her ability to control the outcome, the worst possible outcome, and his/her ability to accept it (see Analyze the Probability of a Feared Event in the Adult Psychotherapy Homework Planner by Jenniffer; Cognitive Therapy of Anxiety Disorders by Gretta armin Mon). Discuss examples demonstrating that unrealistic worry typically overestimates the probability of threats and underestimates or overlooks the client's ability to manage realistic demands (or assign Past Successful Anxiety Coping in the Adult Psychotherapy Homework Planner by Jenniffer). Encourage the client to share his/her thoughts and feelings of depression; express empathy and build rapport while identifying primary cognitive, behavioral, interpersonal, or other contributors to depression. Assign the client to self-monitor thoughts, feelings, and actions in daily journal (e.g., Negative Thoughts Trigger Negative Feelings in the Adult Psychotherapy Homework Planner by Jenniffer; Daily Record of Dysfunctional Thoughts in Cognitive Therapy of Depression by Mon Candida Gentry and Shona); process the journal material to challenge depressive thinking patterns and replace them with reality-based thoughts. Facilitate and reinforce the client's shift from biased depressive self-talk and beliefs to reality-based cognitive messages that enhance self-confidence and increase adaptive actions (see Positive Self-Talk in the Adult Psychotherapy Homework Planner by Jenniffer). Engage the client in behavioral activation, increasing his/her activity level and contact with sources of reward, while identifying processes that inhibit activation (see Behavioral Activation for Depression by Loleta, Dimidjian, and Herman-Dunn; or assign Identify and Schedule Pleasant Activities in the Adult Psychotherapy Homework Planner by [facility]); use behavioral  techniques such as instruction, rehearsal, role-playing, role reversal, as needed, to facilitate activity in the client's daily life; reinforce success. Conduct Interpersonal Therapy (see Interpersonal Psychotherapy of Depression by Anne dunker al.), beginning with the assessment of the client's interpersonal inventory of important past and present relationships; develop a case formulation linking depression to grief, interpersonal role disputes, role transitions, and/or interpersonal deficits). Assign behavioral experiments in which depressive automatic thoughts are treated as hypotheses/prediction, reality-based alternative hypotheses/prediction are generated, and both are tested against the client's past, present, and/or future experiences. Conduct Problem-Solving Therapy (see Problem-Solving  Therapy by D'Zurilla and Nezu) using techniques such as psychoeducation, modeling, and role-playing to teach client problem-solving skills (i.e., defining a problem specifically, generating possible solutions, evaluating the pros and cons of each solution, selecting and implementing a plan of action, evaluating the efficacy of the plan, accepting or revising the plan); role-play application of the problem-solving skill to a real life issue (or assign Applying Problem-Solving to Interpersonal Conflict in the Adult Psychotherapy Homework Planner by Jenniffer). Teach conflict resolution skills (e.g., empathy, active listening, I messages, respectful communication, assertiveness without aggression, compromise); use psychoeducation, modeling, role-playing, and rehearsal to work through several current conflicts; assign homework exercises; review and repeat so as to integrate their use into the client's life. Discuss with the client the distinction between a lapse and relapse, associating a lapse with a rather common, temporary setback that may involve, for example, re-experiencing a depressive thought and/or urge to  withdraw or avoid (perhaps as related to some loss or conflict) and a relapse as a sustained return to a pattern of depressive thinking and feeling usually accompanied by interpersonal withdrawal and/or avoidance. Identify and rehearse with the client the management of future situations or circumstances in which lapses could occur. Consistent with the treatment model, discuss how cognitive, behavioral, interpersonal, and/or other factors (e.g., family history) contribute to depression. Assess the manifestation of chronic pain, its history, current status, triggers, and methods of coping (see The Handbook of Pain Assessment by Corinne armin Sow). Teach the client self-monitoring of his/her symptoms; ask the client to keep a pain journal that records time of day, where and what he/she was doing, the severity of stress at the time, the severity of, and what was done to alleviate the pain (or assign Pain and Stress Journal in the Adult Psychotherapy Homework Planner by [facility]); process the journal with the client to increase understanding of the nature of the pain, cognitive, affective, and behavioral triggers, and the positive or negative effects of the coping strategies he/she is currently using. Teach the client relaxation skills (e.g., progressive muscle relaxation, guided imagery, slow diaphragmatic breathing) or mindfulness meditation, explaining the rationale and how to apply these skills to his/her daily life (see New Directions in Progressive Muscle Relaxation by Thornell Collier, and Hazlett-Stevens). Assign the client to read about progressive muscle relaxation and other calming strategies in relevant books or treatment manuals (e.g., The Relaxation and Stress Reduction Workbook by Nicholaus Aw, and Durand; Living Beyond Your Pain by Doreene and [place]). Explore the client's schema and self-talk that mediate his/her pain response, challenging the biases, assisting him/her in generating  thoughts that correct for the biases, facilitate coping, and build confidence in managing pain. Use cognitive therapy techniques to help the client change his/her view of their pain and suffering from overwhelming to manageable. Teach the client specific coping skills based on an assessment of need (e.g., problem-solving, social/communication, conflict resolution, goal-setting). Assist the client in reframing thoughts about his/her life as one that has many positive elements outside of the pain; ask him/her to list positive aspects of himself/herself as well as his life circumstances (or assign Positive Self-Talk and/or What's Good about Me and My Life? in the Adult Psychotherapy Homework Planner by Jenniffer).   Diagnosis:MDD (major depressive disorder), recurrent episode, moderate (HCC)   Generalized anxiety disorder   Plan:  -consider discussing concerns related to son with DSS worker -next session will Tuesday, August 26th, 2025 at 12pm virtual.

## 2023-08-21 ENCOUNTER — Encounter (HOSPITAL_COMMUNITY): Payer: Self-pay | Admitting: Psychiatry

## 2023-08-21 ENCOUNTER — Telehealth (HOSPITAL_COMMUNITY): Admitting: Psychiatry

## 2023-08-21 VITALS — Wt 275.0 lb

## 2023-08-21 DIAGNOSIS — F331 Major depressive disorder, recurrent, moderate: Secondary | ICD-10-CM

## 2023-08-21 DIAGNOSIS — F410 Panic disorder [episodic paroxysmal anxiety] without agoraphobia: Secondary | ICD-10-CM | POA: Diagnosis not present

## 2023-08-21 DIAGNOSIS — F411 Generalized anxiety disorder: Secondary | ICD-10-CM | POA: Diagnosis not present

## 2023-08-21 NOTE — Progress Notes (Signed)
 [facility] MD Virtual Progress Note   Patient Location: Work Provider Location: Home Office  I connect with patient by video and verified that I am speaking with correct person by using two identifiers. I discussed the limitations of evaluation and management by telemedicine and the availability of in person appointments. I also discussed with the patient that there may be a patient responsible charge related to this service. The patient expressed understanding and agreed to proceed.  Crystal Diaz [id number] 33 y.o.  08/21/2023 10:39 AM  History of Present Illness:  Patient was evaluated by video session.  She is at work.  She reported stopped taking the medication more than few months ago because she would like to try to get better but now medication.  Patient told her 35 year old son is now living at his grandmother's house where his father also lives.  Patient told he had a big incident few weeks ago when he broke the patio door and patient was very upset.  Now he is calling every day and like to come back with the patient.  Patient told initial plan was to stay there and switching his school because grandmother wants to have more time with him and her for that his dad should be more in his life.  However things are not working out.  Now patient may have to bring him back to get ready for school.  Patient told without the medication she is feeling okay.  Occasionally she has crying and negative thoughts but she is sleeping better.  She denies any major panic attack, crying spells or hopelessness.  Occasionally she feels sad when she think about her mother.  She had a good support from her spouse.  She is working and job is going okay.  She is in therapy.  Nathanel Coombs.  She reported weight gain since not as active.  She has chronic back issue and not very active.  She is going to start [employer] program and taking the holistic approach to help her weight and back issue.   She recalled few outburst but they are manageable.  She still does not want to go back on medication and like to give a try as most of the time she is able to manage her symptoms with the help of coping skills.  She was taking Abilify , propranolol  and Prozac .  Occasionally she takes the Klonopin  when she feels very nervous or anxious.  She denies drinking or using any illegal substances.  Recently had blood work and labs are stable other than low vitamin D .  She has not started the vitamin D  supplements yet.  Energy level is fair.  Past Psychiatric History: Denies any history of suicidal attempt, inpatient treatment, psychosis, hallucination, PTSD. Tried Zoloft , Effexor  and Lexapro  from PCP but they were ineffective. Lamictal  helped but caused rash. Had IOP in Feb 2025,   Past Medical History:  Diagnosis Date   Anemia    Anxiety    Depression 01/2018   Encounter for birth control 03/23/2018   GERD (gastroesophageal reflux disease)    Headache    Lumbar radiculopathy    PCOS (polycystic ovarian syndrome)    Plantar fasciitis, bilateral     Outpatient Encounter Medications as of 08/21/2023  Medication Sig   ARIPiprazole  (ABILIFY ) 15 MG tablet Take 0.5 tablets (7.5 mg total) by mouth daily. (Patient not taking: Reported on 07/15/2023)   clonazePAM  (KLONOPIN ) 0.5 MG tablet Take 1 tablet (0.5 mg total) by mouth 3 (three) times  daily as needed.   cyanocobalamin  (VITAMIN B12) 1000 MCG/ML injection Inject 1 mL (1,000 mcg total) into the muscle every 30 (thirty) days. (Patient not taking: Reported on 07/15/2023)   desogestrel -ethinyl estradiol  (MIRCETTE ) 0.15-0.02/0.01 MG (21/5) tablet Take 1 tablet by mouth daily. (Patient not taking: Reported on 07/15/2023)   FLUoxetine  (PROZAC ) 20 MG capsule Take 1 capsule (20 mg total) by mouth daily. (Patient not taking: Reported on 07/15/2023)   ketoconazole  (NIZORAL ) 2 % cream Apply 1 Application topically 2 (two) times daily. (Patient not taking: Reported on 07/15/2023)    lidocaine  (XYLOCAINE ) 5 % ointment Apply 1 Application topically as needed. (Patient not taking: Reported on 07/15/2023)   medroxyPROGESTERone  (PROVERA ) 10 MG tablet Take 1 tablet (10 mg total) by mouth daily. (Patient not taking: Reported on 07/15/2023)   pantoprazole  (PROTONIX ) 40 MG tablet Take 1 tablet (40 mg total) by mouth daily for heartburn (Patient not taking: Reported on 07/15/2023)   Prenatal Vit-Fe Fumarate-FA (PRENATAL VITAMIN) 27-0.8 MG TABS Take 1 tablet by mouth daily. (Patient not taking: Reported on 07/15/2023)   propranolol  (INDERAL ) 10 MG tablet Take 1 tablet (10 mg total) by mouth daily. (Patient not taking: Reported on 07/15/2023)   SYRINGE-NEEDLE, DISP, 3 ML (B-D 3CC LUER-LOK SYR 23GX1) 23G X 1 3 ML MISC Use once monthly with vitamin B12 as directed (Patient not taking: Reported on 07/15/2023)   Vitamin D , Ergocalciferol , (DRISDOL ) 1.25 MG (50000 UNIT) CAPS capsule Take 1 capsule (50,000 Units total) by mouth once a week for 12 weeks (Patient not taking: Reported on 07/15/2023)   No facility-administered encounter medications on file as of 08/21/2023.    Recent Results (from the past 2160 hours)  VITAMIN D  25 Hydroxy (Vit-D Deficiency, Fractures)     Status: Abnormal   Collection Time: 07/10/23 12:22 PM  Result Value Ref Range   VITD 11.57 (L) 30.00 - 100.00 ng/mL  Vitamin B12     Status: None   Collection Time: 07/10/23 12:22 PM  Result Value Ref Range   Vitamin B-12 219 211 - 911 pg/mL  Lipid panel     Status: None   Collection Time: 07/10/23 12:22 PM  Result Value Ref Range   Cholesterol 128 0 - 200 mg/dL    Comment: ATP III Classification       Desirable:  < 200 mg/dL               Borderline High:  200 - 239 mg/dL          High:  > = 759 mg/dL   Triglycerides 40.9 0.0 - 149.0 mg/dL    Comment: Normal:  <849 mg/dLBorderline High:  150 - 199 mg/dL   HDL 48.89 >60.99 mg/dL   VLDL 88.1 0.0 - 59.9 mg/dL   LDL Cholesterol 65 0 - 99 mg/dL   Total CHOL/HDL Ratio 3     Comment:                 Men          Women1/2 Average Risk     3.4          3.3Average Risk          5.0          4.42X Average Risk          9.6          7.13X Average Risk          15.0  11.0                       NonHDL 76.86     Comment: NOTE:  Non-HDL goal should be 30 mg/dL higher than patient's LDL goal (i.e. LDL goal of < 70 mg/dL, would have non-HDL goal of < 100 mg/dL)  POCT urine pregnancy     Status: Normal   Collection Time: 07/15/23  9:37 AM  Result Value Ref Range   Preg Test, Ur Negative Negative     Psychiatric Specialty Exam: Physical Exam  Review of Systems  Musculoskeletal:  Positive for back pain.    Weight 275 lb (124.7 kg).There is no height or weight on file to calculate BMI.  General Appearance: Casual  Eye Contact:  Good  Speech:  Normal Rate  Volume:  Normal  Mood:  Anxious  Affect:  Congruent  Thought Process:  Goal Directed  Orientation:  Full (Time, Place, and Person)  Thought Content:  Rumination  Suicidal Thoughts:  No  Homicidal Thoughts:  No  Memory:  Immediate;   Good Recent;   Good Remote;   Good  Judgement:  Fair  Insight:  Shallow  Psychomotor Activity:  Normal  Concentration:  Concentration: Good and Attention Span: Good  Recall:  Good  Fund of Knowledge:  Good  Language:  Good  Akathisia:  No  Handed:  Right  AIMS (if indicated):     Assets:  Communication Skills Desire for Improvement Housing Talents/Skills Transportation  ADL's:  Intact  Cognition:  WNL  Sleep:  ok       07/10/2023   12:26 PM 01/28/2023    1:18 PM 01/26/2023   12:22 PM 01/12/2023    8:19 AM 02/18/2022    9:48 AM  Depression screen PHQ 2/9  Decreased Interest 2 3 3  0 2  Down, Depressed, Hopeless 2 3 2  0 2  PHQ - 2 Score 4 6 5  0 4  Altered sleeping 2 3 3  3   Tired, decreased energy 2 3 2  2   Change in appetite 2 2 2  2   Feeling bad or failure about yourself  2 3 2  2   Trouble concentrating 1 2 1  1   Moving slowly or fidgety/restless 0 1 0  0  Suicidal  thoughts 1 3 1  1   PHQ-9 Score 14 23 16  15   Difficult doing work/chores Not difficult at all Very difficult Very difficult  Extremely dIfficult    Assessment/Plan: GAD (generalized anxiety disorder)  MDD (major depressive disorder), recurrent episode, moderate (HCC)  Panic attacks  Patient is a 33 year old African-American female with history of low vitamin D , status post gastric bypass, GERD, generalized anxiety disorder, panic attacks and major depressive disorder.  I reviewed collateral information from other provider, current medication, blood work results.  Discussed psychosocial stressors.  Currently patient doing fine without the medication and able to handle her symptoms.  She also feels since son is not staying with her her stress level is not as increased.  However her son is most likely coming back to stay with her.  Discussed coping skills, emphasis to continue therapy with Nathanel Coombs.  Patient like to try without the medication for now but realizes if needed and she will call us  back for medication.  She is on Abilify  7.5, Prozac  20 mg daily and propranolol  10 mg and Klonopin  as needed.  No new medication given visit.  Reviewed blood work results with the patient.  Low vitamin D  but otherwise labs are stable.  Hemoglobin A1c normal.  Will follow-up in 3 months unless patient need an sooner appointment.   Follow Up Instructions:     I discussed the assessment and treatment plan with the patient. The patient was provided an opportunity to ask questions and all were answered. The patient agreed with the plan and demonstrated an understanding of the instructions.   The patient was advised to call back or seek an in-person evaluation if the symptoms worsen or if the condition fails to improve as anticipated.    Collaboration of Care: Other provider involved in patient's care AEB notes are available in epic to review  Patient/Guardian was advised Release of Information must be  obtained prior to any record release in order to collaborate their care with an outside provider. Patient/Guardian was advised if they have not already done so to contact the registration department to sign all necessary forms in order for us  to release information regarding their care.   Consent: Patient/Guardian gives verbal consent for treatment and assignment of benefits for services provided during this visit. Patient/Guardian expressed understanding and agreed to proceed.     Total encounter time 26 minutes which includes face-to-face time, chart reviewed, care coordination, order entry and documentation during this encounter.   Note: This document was prepared by [employer] voice dictation technology and any errors that results from this process are unintentional.    Leni ONEIDA Client, MD 08/21/2023

## 2023-09-08 ENCOUNTER — Ambulatory Visit (INDEPENDENT_AMBULATORY_CARE_PROVIDER_SITE_OTHER): Admitting: Professional

## 2023-09-08 ENCOUNTER — Encounter: Payer: Self-pay | Admitting: Professional

## 2023-09-08 DIAGNOSIS — F411 Generalized anxiety disorder: Secondary | ICD-10-CM

## 2023-09-08 DIAGNOSIS — F331 Major depressive disorder, recurrent, moderate: Secondary | ICD-10-CM | POA: Diagnosis not present

## 2023-09-08 NOTE — Progress Notes (Signed)
 [facility] Counselor/Therapist Progress Note   Patient ID: Crystal Diaz, MRN: [id number],     Date: 09/08/2023   Time Spent: 46 minutes 1206-1252pm   Treatment Type: Individual Therapy   Risk Assessment: Danger to Self:  No Self-injurious Behavior: No Danger to Others: No   Subjective: This session was held via video teletherapy. The patient consented to video teletherapy and was located in her home during this session. She is aware it is the responsibility of the patient to secure confidentiality on her end of the session. The provider was in a private home office for the duration of this session.    The patient arrived early for her Caregility appointment.    Issues addressed: 1-homework -consider discussing concerns related to son with DSS worker 2-mood -intermittent depressed and lonely -sometimes helpless and hopeless but did so this morning 3-son [name] a-is back from his father's home and having some issues -she is trying to not dwell on what is happening but doing the job of parenting -she is having some positive interactions with her son -school shopping and she was agitated at son not wanting what she could afford b-her bf is not interested in dealing with it at all and pt questions the future of relationship c-first day of school went pretty good -his school physical must be uploaded and they are waiting for approval by the athletic director -pt not permitted cell phone at school due to amount of problems in school -she went to bus stop to get cell phone and he said it was in his room and he lied 4-how has therapy been helpful a-being able to talk about things because she has no one else b-would like the outcome to be a change in the way she feels but has not yet managed -pt said it's helpful to get it out but she still feels depressed c-pt was hoping to feel better but doesn't think things will ever get better regarding loss of mom d-anniversary  of father's death earlier this month and bf did not even acknowledge; this happened on the same day he told her he was not dealing with her son 4-character -doesn't trust people -does not like two-faced people; gossip one minute and talk to the person the next 5-relationships -how to develop healthy relationships -where to meet people that you might have things in common -when will you know if you would invite a person into your personal life -avoid putting all you eggs in one basket  Treatment Plan Problems: Unipolar Depression, Anxiety, Chronic Pain, Grief / Loss Unresolved Symptoms: Excessive and/or unrealistic worry that is difficult to control occurring more days than not for at least 6 months about a number of events or activities. Hypervigilance (e.g., feeling constantly on edge, experiencing concentration difficulties, having trouble falling or staying asleep, exhibiting a general state of irritability). Experiences pain beyond the normal healing process (six months or more) that significantly limits physical activities. Uses increased amounts of medications with little, if any, pain relief. Experiences back or neck pain, interstitial cystitis, or diabetic neuropathy. Has decreased or stopped activities such as work, household chores, socializing, exercise, sex, or other pleasurable activities because of pain. Experiences an increase in general physical discomfort (e.g., fatigue, night sweats, insomnia, muscle tension, body aches). Exhibits signs and symptoms of depression. Makes many complaintive, depressive statements like I can't do what I used to; No one understands me; Why me?; When will this go away?; I can't take this pain anymore; and I  can't go on. Thoughts dominated by loss coupled with poor concentration, tearful spells, and confusion about the future. Serial losses in life (i.e., deaths, divorces, jobs) that led to depression and discouragement. Lack of  appetite, weight loss, and/or insomnia as well as other depression signs that occurred since the loss. Strong emotional response of sadness exhibited when losses are discussed. Feelings of guilt that not enough was done for the lost significant other, or an unreasonable belief of having contributed to the death of the significant other. Depressed or irritable mood. Decrease or loss of appetite. Diminished interest in or enjoyment of activities. Psychomotor agitation or retardation. Sleeplessness or hypersomnia. Lack of energy. Social withdrawal. Feelings of hopelessness, worthlessness, or inappropriate guilt. Low self-esteem. Unresolved grief issues. History of chronic or recurrent depression for which the client has taken antidepressant medication, been hospitalized, had outpatient treatment, or had a course of electroconvulsive therapy. Goals: Reduce overall frequency, intensity, and duration of the anxiety so that daily functioning is not impaired. Stabilize anxiety level while increasing ability to function on a daily basis. Resolve the core conflict that is the source of anxiety. Enhance ability to effectively cope with the full variety of life's worries and anxieties. Learn and implement coping skills that result in a reduction of anxiety and worry, and improved daily functioning. Acquire and utilize the necessary pain management skills. Find relief from pain and build renewed contentment and joy in performing activities of everyday life. Begin a healthy grieving process around the loss. Develop an awareness of how the avoidance of grieving has affected life and begin the healing process. Complete the process of letting go of the lost significant other. Alleviate depressive symptoms and return to previous level of effective functioning. Recognize, accept, and cope with feelings of depression. Develop healthy thinking patterns and beliefs about self, others, and the world that lead to  the alleviation and help prevent the relapse of depression. Develop healthy interpersonal relationships that lead to the alleviation and help prevent the relapse of depression. Appropriately grieve the loss in order to normalize mood and to return to previously adaptive level of functioning. Objectives target date for all objectives is 03/01/2024 Describe situations, thoughts, feelings, and actions associated with anxieties and worries, their impact on functioning, and attempts to resolve them. Verbalize an understanding of the cognitive, physiological, and behavioral components of anxiety and its treatment. Learn and implement calming skills to reduce overall anxiety and manage anxiety symptoms. Verbalize an understanding of the role that cognitive biases play in excessive irrational worry and persistent anxiety symptoms. Identify, challenge, and replace biased, fearful self-talk with positive, realistic, and empowering self-talk. Learn and implement problem-solving strategies for realistically addressing worries. Identify and engage in pleasant activities on a daily basis. Describe the nature of, history of, impact of, and understood causes of chronic pain. Identify and monitor specific pain triggers. Learn and implement calming skills such as relaxation, biofeedback, or mindfulness meditation to ease pain. Identify, challenge, and change maladaptive thoughts and beliefs about pain and pain management and replace them with more adaptive thoughts and beliefs. Learn and implement specific coping skills as well as when and how to use them to manage pain and its consequences. Engage in positive self-talk as an alternative to the depressing, negative thoughts about self and the world. Tell in detail the story of the current loss that is triggering symptoms. Identify what stages of grief have been experienced in the continuum of the grieving process. Begin verbalizing feelings associated with the  loss.  Attend a grief/loss support group. Identify how avoiding dealing with loss has negatively impacted life. Decrease unrealistic thoughts, statements, and feelings of being responsible for the loss. Express thoughts and feelings about the deceased that went unexpressed while the deceased was alive. Report decreased time spent each day focusing on the loss. Describe current and past experiences with depression including their impact on functioning and attempts to resolve it. Verbalize an accurate understanding of depression. Identify and replace thoughts and beliefs that support depression. Learn and implement behavioral strategies to overcome depression. Identify important people in life, past and present, and describe the quality, good and poor, of those relationships. Learn and implement problem-solving and decision-making skills. Learn and implement conflict resolution skills to resolve interpersonal problems. Learn and implement relapse prevention skills. Interventions: Use empathy, compassion, and support, allowing the client to tell in detail the story of his/her recent loss. Ask the client to elaborate in an autobiography the circumstances, feelings, and effects of the losses in him/her; assess the characteristics of the loss (e.g., type, suddenness, trauma), previous functioning, current functioning, and coping style. Ask the client to list ways that avoidance of grieving has negatively impacted his/her life. Use a cognitive therapy approach to identify the client's bias toward thoughts of personal responsibility for the loss and replace them with factual, reality-based thoughts (or assign Negative Thoughts Trigger Negative Feelings in the Adult Psychotherapy Homework Planner by Jenniffer). Conduct an empty-chair exercise with the client where he/she focuses on expressing to the lost loved one imagined in the chair what he/she never said while that loved one was alive. Assign the  client to visit the grave of the lost loved one to talk to the deceased and express his/her feelings. Ask the client to write a letter to the lost person describing his/her fond memories and/or painful and regretful memories, and how he/she currently feels life (or assign Dear _____: A Letter to a Lost Loved One in the Adult Psychotherapy Homework Planner by [facility]); process the letter in session. Assign the client to write to the deceased loved one with a special focus on his/her feelings associated with the last meaningful contact with that person. Suggest that the client set aside a specific time-limited period each day to focus on mourning his/her loss. After each day's time is up, the client will resume regular activities and postpone grieving thoughts until the next scheduled time. For example, mourning times could include putting on dark clothing and/or sad music; clothing would be changed when the allotted time is up. Educate the client on the stages of the grieving process and answer any questions he/she may have. Assist the client in identifying the stages of grief that he/she has experienced and which stage he/she is presently working through. Ask the client to bring pictures or mementos connected with his/her loss to a session and talk about them (or assign Creating a Patent examiner in the Adult Psychotherapy Administrator, arts by Jenniffer). Assist the client in identifying and expressing feelings connected with his/her loss. Ask the client to attend a grief/loss support group and report to the therapist how he/she felt about attending. Ask the client to describe his/her past experiences of anxiety and their impact on functioning; assess the focus, excessiveness, and uncontrollability of the worry and the type, frequency, intensity, and duration of his/her anxiety symptoms (consider using a structured interview such as The Anxiety Disorders Interview Schedule-Adult Version). Explore the  client's schema and self-talk that mediate his/her fear response; assist him/her in challenging the  biases; replace the distorted messages with reality-based alternatives and positive, realistic self-talk that will increase his/her self-confidence in coping with irrational fears (see Cognitive Therapy of Anxiety Disorders by Gretta armin Mon). Teach the client problem-solving strategies involving specifically defining a problem, generating options for addressing it, evaluating the pros and cons of each option, selecting and implementing an optional action, and reevaluating and refining the action (or assign Applying Problem-Solving to Interpersonal Conflict in the Adult Psychotherapy Homework Planner by Jenniffer). Engage the client in behavioral activation, increasing the client's contact with sources of reward, identifying processes that inhibit activation, and teaching skills to solve life problems (or assign Identify and Schedule Pleasant Activities in the Adult Psychotherapy Homework Planner by Jenniffer); use behavioral techniques such as instruction, rehearsal, role-playing, role reversal as needed to assist adoption into the client's daily life; reinforce success. Discuss how generalized anxiety typically involves excessive worry about unrealistic threats, various bodily expressions of tension, overarousal, and hypervigilance, and avoidance of what is threatening that interact to maintain the problem (see Mastery of Your Anxiety and Worry: Therapist Guide by Venson River, and [place]; Treating Generalized Anxiety Disorder by Rygh and Red). Discuss how treatment targets worry, anxiety symptoms, and avoidance to help the client manage worry effectively, reduce overarousal, and eliminate unnecessary avoidance. Teach the client calming/relaxation skills (e.g., applied relaxation, progressive muscle relaxation, cue controlled relaxation; mindful breathing; biofeedback) and how to discriminate better  between relaxation and tension; teach the client how to apply these skills to his/her daily life (e.g., New Directions in Progressive Muscle Relaxation by Thornell Collier, and Hazlett-Stevens; Treating Generalized Anxiety Disorder by Rygh and Red). Assign the client to read about progressive muscle relaxation and other calming strategies in relevant books or treatment manuals (e.g., Progressive Relaxation Training by Thornell and Collier; Mastery of Your Anxiety and Worry: Workbook by River armin Given). Assist the client in analyzing his/her worries by examining potential biases such as the probability of the negative expectation occurring, the real consequences of it occurring, his/her ability to control the outcome, the worst possible outcome, and his/her ability to accept it (see Analyze the Probability of a Feared Event in the Adult Psychotherapy Homework Planner by Jenniffer; Cognitive Therapy of Anxiety Disorders by Gretta armin Mon). Discuss examples demonstrating that unrealistic worry typically overestimates the probability of threats and underestimates or overlooks the client's ability to manage realistic demands (or assign Past Successful Anxiety Coping in the Adult Psychotherapy Homework Planner by Jenniffer). Encourage the client to share his/her thoughts and feelings of depression; express empathy and build rapport while identifying primary cognitive, behavioral, interpersonal, or other contributors to depression. Assign the client to self-monitor thoughts, feelings, and actions in daily journal (e.g., Negative Thoughts Trigger Negative Feelings in the Adult Psychotherapy Homework Planner by Jenniffer; Daily Record of Dysfunctional Thoughts in Cognitive Therapy of Depression by Mon Candida Gentry and Shona); process the journal material to challenge depressive thinking patterns and replace them with reality-based thoughts. Facilitate and reinforce the client's shift from biased  depressive self-talk and beliefs to reality-based cognitive messages that enhance self-confidence and increase adaptive actions (see Positive Self-Talk in the Adult Psychotherapy Homework Planner by Jenniffer). Engage the client in behavioral activation, increasing his/her activity level and contact with sources of reward, while identifying processes that inhibit activation (see Behavioral Activation for Depression by Loleta, Dimidjian, and Herman-Dunn; or assign Identify and Schedule Pleasant Activities in the Adult Psychotherapy Homework Planner by [facility]); use behavioral techniques such as instruction, rehearsal, role-playing, role reversal, as needed, to  facilitate activity in the client's daily life; reinforce success. Conduct Interpersonal Therapy (see Interpersonal Psychotherapy of Depression by Anne dunker al.), beginning with the assessment of the client's interpersonal inventory of important past and present relationships; develop a case formulation linking depression to grief, interpersonal role disputes, role transitions, and/or interpersonal deficits). Assign behavioral experiments in which depressive automatic thoughts are treated as hypotheses/prediction, reality-based alternative hypotheses/prediction are generated, and both are tested against the client's past, present, and/or future experiences. Conduct Problem-Solving Therapy (see Problem-Solving Therapy by Francisco and Nezu) using techniques such as psychoeducation, modeling, and role-playing to teach client problem-solving skills (i.e., defining a problem specifically, generating possible solutions, evaluating the pros and cons of each solution, selecting and implementing a plan of action, evaluating the efficacy of the plan, accepting or revising the plan); role-play application of the problem-solving skill to a real life issue (or assign Applying Problem-Solving to Interpersonal Conflict in the Adult Psychotherapy Homework  Planner by Jenniffer). Teach conflict resolution skills (e.g., empathy, active listening, I messages, respectful communication, assertiveness without aggression, compromise); use psychoeducation, modeling, role-playing, and rehearsal to work through several current conflicts; assign homework exercises; review and repeat so as to integrate their use into the client's life. Discuss with the client the distinction between a lapse and relapse, associating a lapse with a rather common, temporary setback that may involve, for example, re-experiencing a depressive thought and/or urge to withdraw or avoid (perhaps as related to some loss or conflict) and a relapse as a sustained return to a pattern of depressive thinking and feeling usually accompanied by interpersonal withdrawal and/or avoidance. Identify and rehearse with the client the management of future situations or circumstances in which lapses could occur. Consistent with the treatment model, discuss how cognitive, behavioral, interpersonal, and/or other factors (e.g., family history) contribute to depression. Assess the manifestation of chronic pain, its history, current status, triggers, and methods of coping (see The Handbook of Pain Assessment by Corinne armin Sow). Teach the client self-monitoring of his/her symptoms; ask the client to keep a pain journal that records time of day, where and what he/she was doing, the severity of stress at the time, the severity of, and what was done to alleviate the pain (or assign Pain and Stress Journal in the Adult Psychotherapy Homework Planner by [facility]); process the journal with the client to increase understanding of the nature of the pain, cognitive, affective, and behavioral triggers, and the positive or negative effects of the coping strategies he/she is currently using. Teach the client relaxation skills (e.g., progressive muscle relaxation, guided imagery, slow diaphragmatic breathing) or mindfulness  meditation, explaining the rationale and how to apply these skills to his/her daily life (see New Directions in Progressive Muscle Relaxation by Thornell Collier, and Hazlett-Stevens). Assign the client to read about progressive muscle relaxation and other calming strategies in relevant books or treatment manuals (e.g., The Relaxation and Stress Reduction Workbook by Nicholaus Aw, and [place]; Living Beyond Your Pain by Doreene and [place]). Explore the client's schema and self-talk that mediate his/her pain response, challenging the biases, assisting him/her in generating thoughts that correct for the biases, facilitate coping, and build confidence in managing pain. Use cognitive therapy techniques to help the client change his/her view of their pain and suffering from overwhelming to manageable. Teach the client specific coping skills based on an assessment of need (e.g., problem-solving, social/communication, conflict resolution, goal-setting). Assist the client in reframing thoughts about his/her life as one that has many positive elements outside of the pain;  ask him/her to list positive aspects of himself/herself as well as his life circumstances (or assign Positive Self-Talk and/or What's Good about Me and My Life? in the Adult Psychotherapy Homework Planner by Jenniffer).   Diagnosis:MDD (major depressive disorder), recurrent episode, moderate (HCC)   Generalized anxiety disorder   Plan:  -consider opportunities to meet people and develop relationships -next session will Tuesday, August 26th, 2025 at 12pm virtual.

## 2023-09-21 ENCOUNTER — Other Ambulatory Visit: Payer: Self-pay | Admitting: Primary Care

## 2023-09-21 ENCOUNTER — Other Ambulatory Visit: Payer: Self-pay

## 2023-09-21 DIAGNOSIS — K219 Gastro-esophageal reflux disease without esophagitis: Secondary | ICD-10-CM

## 2023-09-21 MED FILL — Pantoprazole Sodium EC Tab 40 MG (Base Equiv): ORAL | 90 days supply | Qty: 90 | Fill #0 | Status: AC

## 2023-09-22 ENCOUNTER — Other Ambulatory Visit: Payer: Self-pay

## 2023-09-23 ENCOUNTER — Other Ambulatory Visit: Payer: Self-pay

## 2023-10-13 ENCOUNTER — Ambulatory Visit: Admitting: Professional

## 2023-10-13 ENCOUNTER — Encounter: Payer: Self-pay | Admitting: Professional

## 2023-10-13 DIAGNOSIS — F331 Major depressive disorder, recurrent, moderate: Secondary | ICD-10-CM | POA: Diagnosis not present

## 2023-10-13 DIAGNOSIS — F411 Generalized anxiety disorder: Secondary | ICD-10-CM | POA: Diagnosis not present

## 2023-10-13 NOTE — Progress Notes (Signed)
 [facility] Counselor/Therapist Progress Note   Patient ID: Crystal Diaz, MRN: [id number],     Date: 10/13/2023   Time Spent: 30 minutes 1202-1232pm   Treatment Type: Individual Therapy   Risk Assessment: Danger to Self:  No Self-injurious Behavior: No Danger to Others: No   Subjective: This session was held via video teletherapy. The patient consented to video teletherapy and was located in her home during this session. She is aware it is the responsibility of the patient to secure confidentiality on her end of the session. The provider was in a private home office for the duration of this session.    The patient arrived early for her Caregility appointment.    Issues addressed: 1-personal a-move to [facility] Nov 15th and to be closer to her fella Nequan -bf understands to be part of pt's life he must be part of her life -she thinks that it was a phase he was going through -he is trying and pt feels he really loves her b-son is disrespectful and unapproachable -son is excited about the move -he likes mom's bf -bf doesn't like how her son talks to her -son likes him and likes to play games with him c-pt and bf made decision to remain in their own space and adjust -pt plans to rent her home out so that she still has that asset -she will be renting d-family and friends are supportive -grandmother made comment that she is moving away from her -incarcerated brother has had two infractions and cannot have visits until November -he has been placed in the hole and shackled 23 hours per day -provided potential resources to pt  ([facility], previous provider) 2-reviewed treatment plan -reviewed objectives with patient -she has been successful in working her plan and lowest score was 60% on two objectives  Treatment Plan Problems: Unipolar Depression, Anxiety, Chronic Pain, Grief / Loss Unresolved Symptoms: Excessive and/or unrealistic worry that is difficult to  control occurring more days than not for at least 6 months about a number of events or activities. Hypervigilance (e.g., feeling constantly on edge, experiencing concentration difficulties, having trouble falling or staying asleep, exhibiting a general state of irritability). Experiences pain beyond the normal healing process (six months or more) that significantly limits physical activities. Uses increased amounts of medications with little, if any, pain relief. Experiences back or neck pain, interstitial cystitis, or diabetic neuropathy. Has decreased or stopped activities such as work, household chores, socializing, exercise, sex, or other pleasurable activities because of pain. Experiences an increase in general physical discomfort (e.g., fatigue, night sweats, insomnia, muscle tension, body aches). Exhibits signs and symptoms of depression. Makes many complaintive, depressive statements like I can't do what I used to; No one understands me; Why me?; When will this go away?; I can't take this pain anymore; and I can't go on. Thoughts dominated by loss coupled with poor concentration, tearful spells, and confusion about the future. Serial losses in life (i.e., deaths, divorces, jobs) that led to depression and discouragement. Lack of appetite, weight loss, and/or insomnia as well as other depression signs that occurred since the loss. Strong emotional response of sadness exhibited when losses are discussed. Feelings of guilt that not enough was done for the lost significant other, or an unreasonable belief of having contributed to the death of the significant other. Depressed or irritable mood. Decrease or loss of appetite. Diminished interest in or enjoyment of activities. Psychomotor agitation or retardation. Sleeplessness or hypersomnia. Lack of energy. Social withdrawal. Feelings  of hopelessness, worthlessness, or inappropriate guilt. Low self-esteem. Unresolved grief  issues. History of chronic or recurrent depression for which the client has taken antidepressant medication, been hospitalized, had outpatient treatment, or had a course of electroconvulsive therapy. Goals: Reduce overall frequency, intensity, and duration of the anxiety so that daily functioning is not impaired. Stabilize anxiety level while increasing ability to function on a daily basis. Resolve the core conflict that is the source of anxiety. Enhance ability to effectively cope with the full variety of life's worries and anxieties. Learn and implement coping skills that result in a reduction of anxiety and worry, and improved daily functioning. Acquire and utilize the necessary pain management skills. Find relief from pain and build renewed contentment and joy in performing activities of everyday life. Begin a healthy grieving process around the loss. Develop an awareness of how the avoidance of grieving has affected life and begin the healing process. Complete the process of letting go of the lost significant other. Alleviate depressive symptoms and return to previous level of effective functioning. Recognize, accept, and cope with feelings of depression. Develop healthy thinking patterns and beliefs about self, others, and the world that lead to the alleviation and help prevent the relapse of depression. Develop healthy interpersonal relationships that lead to the alleviation and help prevent the relapse of depression. Appropriately grieve the loss in order to normalize mood and to return to previously adaptive level of functioning. Objectives target date for all objectives is 03/01/2024 Describe situations, thoughts, feelings, and actions associated with anxieties and worries, their impact on functioning, and attempts to resolve them.    70 Verbalize an understanding of the cognitive, physiological, and behavioral components of anxiety and its treatment.     80 Learn and implement calming  skills to reduce overall anxiety and manage anxiety symptoms.     80 Verbalize an understanding of the role that cognitive biases play in excessive irrational worry and persistent anxiety symptoms.     70 Identify, challenge, and replace biased, fearful self-talk with positive, realistic, and empowering self-talk.     80 Learn and implement problem-solving strategies for realistically addressing worries.70 Identify and engage in pleasant activities on a daily basis.     60 Describe the nature of, history of, impact of, and understood causes of chronic pain. 90 Identify and monitor specific pain triggers.     90 Learn and implement calming skills such as relaxation, biofeedback, or mindfulness meditation to ease pain.     80 Identify, challenge, and change maladaptive thoughts and beliefs about pain and pain management and replace them with more adaptive thoughts and beliefs.     80 Learn and implement specific coping skills as well as when and how to use them to manage pain and its consequences.     80 Engage in positive self-talk as an alternative to the depressing, negative thoughts about self and the world.     70 Tell in detail the story of the current loss that is triggering symptoms.     100 Identify what stages of grief have been experienced in the continuum of the grieving process.     80 Begin verbalizing feelings associated with the loss.     80 Attend a grief/loss support group.     0 REMOVE PER PT//klc Identify how avoiding dealing with loss has negatively impacted life.   60 Decrease unrealistic thoughts, statements, and feelings of being responsible for the loss.     100 Express thoughts and feelings  about the deceased that went unexpressed while the deceased was alive.     90 Report decreased time spent each day focusing on the loss.     90 Describe current and past experiences with depression including their impact on functioning and attempts to resolve it.     90 Verbalize an  accurate understanding of depression.     80 Identify and replace thoughts and beliefs that support depression.     80 Learn and implement behavioral strategies to overcome depression.     80 Identify important people in life, past and present, and describe the quality, good and poor, of those relationships.     90 Learn and implement problem-solving and decision-making skills.     90 Learn and implement conflict resolution skills to resolve interpersonal problems.     90 Learn and implement relapse prevention skills.   100 Interventions: Use empathy, compassion, and support, allowing the client to tell in detail the story of his/her recent loss. Ask the client to elaborate in an autobiography the circumstances, feelings, and effects of the losses in him/her; assess the characteristics of the loss (e.g., type, suddenness, trauma), previous functioning, current functioning, and coping style. Ask the client to list ways that avoidance of grieving has negatively impacted his/her life. Use a cognitive therapy approach to identify the client's bias toward thoughts of personal responsibility for the loss and replace them with factual, reality-based thoughts (or assign Negative Thoughts Trigger Negative Feelings in the Adult Psychotherapy Homework Planner by Jenniffer). Conduct an empty-chair exercise with the client where he/she focuses on expressing to the lost loved one imagined in the chair what he/she never said while that loved one was alive. Assign the client to visit the grave of the lost loved one to talk to the deceased and express his/her feelings. Ask the client to write a letter to the lost person describing his/her fond memories and/or painful and regretful memories, and how he/she currently feels life (or assign Dear _____: A Letter to a Lost Loved One in the Adult Psychotherapy Homework Planner by [facility]); process the letter in session. Assign the client to write to the deceased loved  one with a special focus on his/her feelings associated with the last meaningful contact with that person. Suggest that the client set aside a specific time-limited period each day to focus on mourning his/her loss. After each day's time is up, the client will resume regular activities and postpone grieving thoughts until the next scheduled time. For example, mourning times could include putting on dark clothing and/or sad music; clothing would be changed when the allotted time is up. Educate the client on the stages of the grieving process and answer any questions he/she may have. Assist the client in identifying the stages of grief that he/she has experienced and which stage he/she is presently working through. Ask the client to bring pictures or mementos connected with his/her loss to a session and talk about them (or assign Creating a Patent examiner in the Adult Psychotherapy Administrator, arts by Jenniffer). Assist the client in identifying and expressing feelings connected with his/her loss. Ask the client to attend a grief/loss support group and report to the therapist how he/she felt about attending. Ask the client to describe his/her past experiences of anxiety and their impact on functioning; assess the focus, excessiveness, and uncontrollability of the worry and the type, frequency, intensity, and duration of his/her anxiety symptoms (consider using a structured interview such as The Anxiety Disorders Interview Schedule-Adult  Version). Explore the client's schema and self-talk that mediate his/her fear response; assist him/her in challenging the biases; replace the distorted messages with reality-based alternatives and positive, realistic self-talk that will increase his/her self-confidence in coping with irrational fears (see Cognitive Therapy of Anxiety Disorders by Gretta armin Mon). Teach the client problem-solving strategies involving specifically defining a problem, generating options for  addressing it, evaluating the pros and cons of each option, selecting and implementing an optional action, and reevaluating and refining the action (or assign Applying Problem-Solving to Interpersonal Conflict in the Adult Psychotherapy Homework Planner by Jenniffer). Engage the client in behavioral activation, increasing the client's contact with sources of reward, identifying processes that inhibit activation, and teaching skills to solve life problems (or assign Identify and Schedule Pleasant Activities in the Adult Psychotherapy Homework Planner by Jenniffer); use behavioral techniques such as instruction, rehearsal, role-playing, role reversal as needed to assist adoption into the client's daily life; reinforce success. Discuss how generalized anxiety typically involves excessive worry about unrealistic threats, various bodily expressions of tension, overarousal, and hypervigilance, and avoidance of what is threatening that interact to maintain the problem (see Mastery of Your Anxiety and Worry: Therapist Guide by Venson River, and Barlow; Treating Generalized Anxiety Disorder by Rygh and Red). Discuss how treatment targets worry, anxiety symptoms, and avoidance to help the client manage worry effectively, reduce overarousal, and eliminate unnecessary avoidance. Teach the client calming/relaxation skills (e.g., applied relaxation, progressive muscle relaxation, cue controlled relaxation; mindful breathing; biofeedback) and how to discriminate better between relaxation and tension; teach the client how to apply these skills to his/her daily life (e.g., New Directions in Progressive Muscle Relaxation by Thornell Collier, and Hazlett-Stevens; Treating Generalized Anxiety Disorder by Rygh and Red). Assign the client to read about progressive muscle relaxation and other calming strategies in relevant books or treatment manuals (e.g., Progressive Relaxation Training by Thornell and Collier;  Mastery of Your Anxiety and Worry: Workbook by River armin Given). Assist the client in analyzing his/her worries by examining potential biases such as the probability of the negative expectation occurring, the real consequences of it occurring, his/her ability to control the outcome, the worst possible outcome, and his/her ability to accept it (see Analyze the Probability of a Feared Event in the Adult Psychotherapy Homework Planner by Jenniffer; Cognitive Therapy of Anxiety Disorders by Gretta armin Mon). Discuss examples demonstrating that unrealistic worry typically overestimates the probability of threats and underestimates or overlooks the client's ability to manage realistic demands (or assign Past Successful Anxiety Coping in the Adult Psychotherapy Homework Planner by Jenniffer). Encourage the client to share his/her thoughts and feelings of depression; express empathy and build rapport while identifying primary cognitive, behavioral, interpersonal, or other contributors to depression. Assign the client to self-monitor thoughts, feelings, and actions in daily journal (e.g., Negative Thoughts Trigger Negative Feelings in the Adult Psychotherapy Homework Planner by Jenniffer; Daily Record of Dysfunctional Thoughts in Cognitive Therapy of Depression by Mon Candida Gentry and Shona); process the journal material to challenge depressive thinking patterns and replace them with reality-based thoughts. Facilitate and reinforce the client's shift from biased depressive self-talk and beliefs to reality-based cognitive messages that enhance self-confidence and increase adaptive actions (see Positive Self-Talk in the Adult Psychotherapy Homework Planner by Jenniffer). Engage the client in behavioral activation, increasing his/her activity level and contact with sources of reward, while identifying processes that inhibit activation (see Behavioral Activation for Depression by Loleta, Dimidjian, and Herman-Dunn;  or assign Identify and Schedule Pleasant Activities in the Adult  Psychotherapy Administrator, arts by Jenniffer); use behavioral techniques such as instruction, rehearsal, role-playing, role reversal, as needed, to facilitate activity in the client's daily life; reinforce success. Conduct Interpersonal Therapy (see Interpersonal Psychotherapy of Depression by Anne dunker al.), beginning with the assessment of the client's interpersonal inventory of important past and present relationships; develop a case formulation linking depression to grief, interpersonal role disputes, role transitions, and/or interpersonal deficits). Assign behavioral experiments in which depressive automatic thoughts are treated as hypotheses/prediction, reality-based alternative hypotheses/prediction are generated, and both are tested against the client's past, present, and/or future experiences. Conduct Problem-Solving Therapy (see Problem-Solving Therapy by Francisco and Nezu) using techniques such as psychoeducation, modeling, and role-playing to teach client problem-solving skills (i.e., defining a problem specifically, generating possible solutions, evaluating the pros and cons of each solution, selecting and implementing a plan of action, evaluating the efficacy of the plan, accepting or revising the plan); role-play application of the problem-solving skill to a real life issue (or assign Applying Problem-Solving to Interpersonal Conflict in the Adult Psychotherapy Homework Planner by Jenniffer). Teach conflict resolution skills (e.g., empathy, active listening, I messages, respectful communication, assertiveness without aggression, compromise); use psychoeducation, modeling, role-playing, and rehearsal to work through several current conflicts; assign homework exercises; review and repeat so as to integrate their use into the client's life. Discuss with the client the distinction between a lapse and relapse, associating a lapse  with a rather common, temporary setback that may involve, for example, re-experiencing a depressive thought and/or urge to withdraw or avoid (perhaps as related to some loss or conflict) and a relapse as a sustained return to a pattern of depressive thinking and feeling usually accompanied by interpersonal withdrawal and/or avoidance. Identify and rehearse with the client the management of future situations or circumstances in which lapses could occur. Consistent with the treatment model, discuss how cognitive, behavioral, interpersonal, and/or other factors (e.g., family history) contribute to depression. Assess the manifestation of chronic pain, its history, current status, triggers, and methods of coping (see The Handbook of Pain Assessment by Corinne armin Sow). Teach the client self-monitoring of his/her symptoms; ask the client to keep a pain journal that records time of day, where and what he/she was doing, the severity of stress at the time, the severity of, and what was done to alleviate the pain (or assign Pain and Stress Journal in the Adult Psychotherapy Homework Planner by [facility]); process the journal with the client to increase understanding of the nature of the pain, cognitive, affective, and behavioral triggers, and the positive or negative effects of the coping strategies he/she is currently using. Teach the client relaxation skills (e.g., progressive muscle relaxation, guided imagery, slow diaphragmatic breathing) or mindfulness meditation, explaining the rationale and how to apply these skills to his/her daily life (see New Directions in Progressive Muscle Relaxation by Thornell Collier, and Hazlett-Stevens). Assign the client to read about progressive muscle relaxation and other calming strategies in relevant books or treatment manuals (e.g., The Relaxation and Stress Reduction Workbook by Nicholaus Aw, and Beatrice; Living Beyond Your Pain by Doreene and Argentina). Explore the  client's schema and self-talk that mediate his/her pain response, challenging the biases, assisting him/her in generating thoughts that correct for the biases, facilitate coping, and build confidence in managing pain. Use cognitive therapy techniques to help the client change his/her view of their pain and suffering from overwhelming to manageable. Teach the client specific coping skills based on an assessment of need (e.g., problem-solving, social/communication, conflict resolution, goal-setting). Assist the  client in reframing thoughts about his/her life as one that has many positive elements outside of the pain; ask him/her to list positive aspects of himself/herself as well as his life circumstances (or assign Positive Self-Talk and/or What's Good about Me and My Life? in the Adult Psychotherapy Homework Planner by Jenniffer).   Diagnosis:MDD (major depressive disorder), recurrent episode, moderate (HCC)   Generalized anxiety disorder   Plan:  -pt provided resources to try and get support for her brother -next session will Tuesday, November 10, 2023 at 12pm virtual.

## 2023-11-02 DIAGNOSIS — M4727 Other spondylosis with radiculopathy, lumbosacral region: Secondary | ICD-10-CM | POA: Diagnosis not present

## 2023-11-02 DIAGNOSIS — M549 Dorsalgia, unspecified: Secondary | ICD-10-CM | POA: Diagnosis not present

## 2023-11-02 DIAGNOSIS — M21062 Valgus deformity, not elsewhere classified, left knee: Secondary | ICD-10-CM | POA: Diagnosis not present

## 2023-11-02 DIAGNOSIS — M2170 Unequal limb length (acquired), unspecified site: Secondary | ICD-10-CM | POA: Diagnosis not present

## 2023-11-04 ENCOUNTER — Other Ambulatory Visit: Payer: Self-pay

## 2023-11-04 MED ORDER — FLUZONE 0.5 ML IM SUSY
0.5000 mL | PREFILLED_SYRINGE | Freq: Once | INTRAMUSCULAR | 0 refills | Status: AC
  Filled 2023-11-04: qty 0.5, 1d supply, fill #0

## 2023-11-10 ENCOUNTER — Ambulatory Visit: Admitting: Professional

## 2023-11-10 ENCOUNTER — Encounter: Payer: Self-pay | Admitting: Professional

## 2023-11-10 DIAGNOSIS — F411 Generalized anxiety disorder: Secondary | ICD-10-CM

## 2023-11-10 DIAGNOSIS — F331 Major depressive disorder, recurrent, moderate: Secondary | ICD-10-CM | POA: Diagnosis not present

## 2023-11-10 NOTE — Progress Notes (Signed)
 [facility] Counselor/Therapist Progress Note   Patient ID: Crystal Diaz, MRN: [id number],     Date: 11/10/2023   Time Spent: 24 minutes 1205-1229pm   Treatment Type: Individual Therapy   Risk Assessment: Danger to Self:  No Self-injurious Behavior: No Danger to Others: No   Subjective: This session was held via video teletherapy. The patient consented to video teletherapy and was located in her home during this session. She is aware it is the responsibility of the patient to secure confidentiality on her end of the session. The provider was in a private home office for the duration of this session.    The patient arrived on time for her Caregility appointment.    Issues addressed: 1-personal a-pt move to [place] this week b-bf supportive of her and is helping her with the move -he is supportive of everything that is going on in his life 2-son [name] in [facility] for minimum of 30 days a-recommended by therapist b-pt feels some relief with not having to deal with daily issues -she is experiencing all the stressors of preparing for the move c-talks daily but she is still involved -she has only met once with his treatment plan when she was told they would meet weekly d-she has talked with MD about starting medication e-he is now taking Melatonin for sleep f-she did have a concern when she tried to reach her son and was told the phones were down and to call back on Monday -she told the worker that would not work and she needed to contact 2-professional -she is not sad to leave the job but is sad to leave the company after 13 years -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 Hypervigilance (e.g., feeling constantly on edge, experiencing concentration difficulties, having trouble falling or staying asleep, exhibiting a general state of irritability). Experiences pain beyond the normal healing process (six months or more) that significantly limits physical activities. Uses increased amounts of medications with little, if any, pain relief. Experiences back or neck pain, interstitial cystitis, or diabetic neuropathy. Has decreased or stopped activities such as work, household chores, socializing, exercise, sex, or other pleasurable activities because of pain. Experiences an increase in general physical discomfort (e.g., fatigue, night sweats, insomnia, muscle tension, body aches). Exhibits signs and symptoms of depression. Makes many complaintive, depressive statements like I can't do what I used to; No one understands me; Why me?; When will this go away?; I can't take this pain anymore; and I can't go on. Thoughts dominated by loss coupled with poor concentration, tearful spells, and confusion about the future. Serial losses in life (i.e., deaths, divorces, jobs) that led to depression and discouragement. Lack of appetite, weight loss, and/or insomnia as well as other depression signs that occurred since the loss. Strong emotional response of sadness exhibited when losses are discussed. Feelings of guilt that not enough was done for the lost significant other, or an unreasonable belief of having contributed to the death of the significant other. Depressed or irritable mood. Decrease or loss of appetite. Diminished  interest in or enjoyment of activities. Psychomotor agitation or retardation. Sleeplessness or hypersomnia. Lack of energy. Social withdrawal. Feelings of hopelessness,  worthlessness, or inappropriate guilt. Low self-esteem. Unresolved grief issues. History of chronic or recurrent depression for which the client has taken antidepressant medication, been hospitalized, had outpatient treatment, or had a course of electroconvulsive therapy. Goals: Reduce overall frequency, intensity, and duration of the anxiety so that daily functioning is not impaired. Stabilize anxiety level while increasing ability to function on a daily basis. Resolve the core conflict that is the source of anxiety. Enhance ability to effectively cope with the full variety of life's worries and anxieties. Learn and implement coping skills that result in a reduction of anxiety and worry, and improved daily functioning. Acquire and utilize the necessary pain management skills. Find relief from pain and build renewed contentment and joy in performing activities of everyday life. Begin a healthy grieving process around the loss. Develop an awareness of how the avoidance of grieving has affected life and begin the healing process. Complete the process of letting go of the lost significant other. Alleviate depressive symptoms and return to previous level of effective functioning. Recognize, accept, and cope with feelings of depression. Develop healthy thinking patterns and beliefs about self, others, and the world that lead to the alleviation and help prevent the relapse of depression. Develop healthy interpersonal relationships that lead to the alleviation and help prevent the relapse of depression. Appropriately grieve the loss in order to normalize mood and to return to previously adaptive level of functioning. Objectives target date for all objectives is 03/01/2024 Describe situations, thoughts, feelings, and actions associated with anxieties and worries, their impact on functioning, and attempts to resolve them.    70 Verbalize an understanding of the cognitive, physiological, and behavioral  components of anxiety and its treatment.     80 Learn and implement calming skills to reduce overall anxiety and manage anxiety symptoms.     80 Verbalize an understanding of the role that cognitive biases play in excessive irrational worry and persistent anxiety symptoms.     70 Identify, challenge, and replace biased, fearful self-talk with positive, realistic, and empowering self-talk.     80 Learn and implement problem-solving strategies for realistically addressing worries.70 Identify and engage in pleasant activities on a daily basis.     60 Describe the nature of, history of, impact of, and understood causes of chronic pain. 90 Identify and monitor specific pain triggers.     90 Learn and implement calming skills such as relaxation, biofeedback, or mindfulness meditation to ease pain.     80 Identify, challenge, and change maladaptive thoughts and beliefs about pain and pain management and replace them with more adaptive thoughts and beliefs.     80 Learn and implement specific coping skills as well as when and how to use them to manage pain and its consequences.     80 Engage in positive self-talk as an alternative to the depressing, negative thoughts about self and the world.     70 Tell in detail the story of the current loss that is triggering symptoms.     100 Identify what stages of grief have been experienced in the continuum of the grieving process.     80 Begin verbalizing feelings associated with the loss.     80 Attend a grief/loss support group.     0 REMOVE PER PT//klc Identify how avoiding dealing with loss has negatively impacted life.   60 Decrease unrealistic  thoughts, statements, and feelings of being responsible for the loss.     100 Express thoughts and feelings about the deceased that went unexpressed while the deceased was alive.     90 Report decreased time spent each day focusing on the loss.     90 Describe current and past experiences with depression including  their impact on functioning and attempts to resolve it.     90 Verbalize an accurate understanding of depression.     80 Identify and replace thoughts and beliefs that support depression.     80 Learn and implement behavioral strategies to overcome depression.     80 Identify important people in life, past and present, and describe the quality, good and poor, of those relationships.     90 Learn and implement problem-solving and decision-making skills.     90 Learn and implement conflict resolution skills to resolve interpersonal problems.     90 Learn and implement relapse prevention skills.   100 Interventions: Use empathy, compassion, and support, allowing the client to tell in detail the story of his/her recent loss. Ask the client to elaborate in an autobiography the circumstances, feelings, and effects of the losses in him/her; assess the characteristics of the loss (e.g., type, suddenness, trauma), previous functioning, current functioning, and coping style. Ask the client to list ways that avoidance of grieving has negatively impacted his/her life. Use a cognitive therapy approach to identify the client's bias toward thoughts of personal responsibility for the loss and replace them with factual, reality-based thoughts (or assign Negative Thoughts Trigger Negative Feelings in the Adult Psychotherapy Homework Planner by Jenniffer). Conduct an empty-chair exercise with the client where he/she focuses on expressing to the lost loved one imagined in the chair what he/she never said while that loved one was alive. Assign the client to visit the grave of the lost loved one to talk to the deceased and express his/her feelings. Ask the client to write a letter to the lost person describing his/her fond memories and/or painful and regretful memories, and how he/she currently feels life (or assign Dear _____: A Letter to a Lost Loved One in the Adult Psychotherapy Homework Planner by [facility]);  process the letter in session. Assign the client to write to the deceased loved one with a special focus on his/her feelings associated with the last meaningful contact with that person. Suggest that the client set aside a specific time-limited period each day to focus on mourning his/her loss. After each day's time is up, the client will resume regular activities and postpone grieving thoughts until the next scheduled time. For example, mourning times could include putting on dark clothing and/or sad music; clothing would be changed when the allotted time is up. Educate the client on the stages of the grieving process and answer any questions he/she may have. Assist the client in identifying the stages of grief that he/she has experienced and which stage he/she is presently working through. Ask the client to bring pictures or mementos connected with his/her loss to a session and talk about them (or assign Creating a Patent Examiner in the Adult Psychotherapy Administrator, Arts by Jenniffer). Assist the client in identifying and expressing feelings connected with his/her loss. Ask the client to attend a grief/loss support group and report to the therapist how he/she felt about attending. Ask the client to describe his/her past experiences of anxiety and their impact on functioning; assess the focus, excessiveness, and uncontrollability of the worry and the type, frequency,  intensity, and duration of his/her anxiety symptoms (consider using a structured interview such as The Anxiety Disorders Interview Schedule-Adult Version). Explore the client's schema and self-talk that mediate his/her fear response; assist him/her in challenging the biases; replace the distorted messages with reality-based alternatives and positive, realistic self-talk that will increase his/her self-confidence in coping with irrational fears (see Cognitive Therapy of Anxiety Disorders by Gretta armin Mon). Teach the client problem-solving  strategies involving specifically defining a problem, generating options for addressing it, evaluating the pros and cons of each option, selecting and implementing an optional action, and reevaluating and refining the action (or assign Applying Problem-Solving to Interpersonal Conflict in the Adult Psychotherapy Homework Planner by Jenniffer). Engage the client in behavioral activation, increasing the client's contact with sources of reward, identifying processes that inhibit activation, and teaching skills to solve life problems (or assign Identify and Schedule Pleasant Activities in the Adult Psychotherapy Homework Planner by Jenniffer); use behavioral techniques such as instruction, rehearsal, role-playing, role reversal as needed to assist adoption into the client's daily life; reinforce success. Discuss how generalized anxiety typically involves excessive worry about unrealistic threats, various bodily expressions of tension, overarousal, and hypervigilance, and avoidance of what is threatening that interact to maintain the problem (see Mastery of Your Anxiety and Worry: Therapist Guide by Venson River, and [place]; Treating Generalized Anxiety Disorder by Rygh and Red). Discuss how treatment targets worry, anxiety symptoms, and avoidance to help the client manage worry effectively, reduce overarousal, and eliminate unnecessary avoidance. Teach the client calming/relaxation skills (e.g., applied relaxation, progressive muscle relaxation, cue controlled relaxation; mindful breathing; biofeedback) and how to discriminate better between relaxation and tension; teach the client how to apply these skills to his/her daily life (e.g., New Directions in Progressive Muscle Relaxation by Thornell Collier, and Hazlett-Stevens; Treating Generalized Anxiety Disorder by Rygh and Red). Assign the client to read about progressive muscle relaxation and other calming strategies in relevant books or  treatment manuals (e.g., Progressive Relaxation Training by Thornell and Collier; Mastery of Your Anxiety and Worry: Workbook by River armin Given). Assist the client in analyzing his/her worries by examining potential biases such as the probability of the negative expectation occurring, the real consequences of it occurring, his/her ability to control the outcome, the worst possible outcome, and his/her ability to accept it (see Analyze the Probability of a Feared Event in the Adult Psychotherapy Homework Planner by Jenniffer; Cognitive Therapy of Anxiety Disorders by Gretta armin Mon). Discuss examples demonstrating that unrealistic worry typically overestimates the probability of threats and underestimates or overlooks the client's ability to manage realistic demands (or assign Past Successful Anxiety Coping in the Adult Psychotherapy Homework Planner by Jenniffer). Encourage the client to share his/her thoughts and feelings of depression; express empathy and build rapport while identifying primary cognitive, behavioral, interpersonal, or other contributors to depression. Assign the client to self-monitor thoughts, feelings, and actions in daily journal (e.g., Negative Thoughts Trigger Negative Feelings in the Adult Psychotherapy Homework Planner by Jenniffer; Daily Record of Dysfunctional Thoughts in Cognitive Therapy of Depression by Mon Candida Gentry and Shona); process the journal material to challenge depressive thinking patterns and replace them with reality-based thoughts. Facilitate and reinforce the client's shift from biased depressive self-talk and beliefs to reality-based cognitive messages that enhance self-confidence and increase adaptive actions (see Positive Self-Talk in the Adult Psychotherapy Homework Planner by Jenniffer). Engage the client in behavioral activation, increasing his/her activity level and contact with sources of reward, while identifying processes that inhibit  activation (see  Behavioral Activation for Depression by Loleta, Dimidjian, and Herman-Dunn; or assign Identify and Schedule Pleasant Activities in the Adult Psychotherapy Homework Planner by [facility]); use behavioral techniques such as instruction, rehearsal, role-playing, role reversal, as needed, to facilitate activity in the client's daily life; reinforce success. Conduct Interpersonal Therapy (see Interpersonal Psychotherapy of Depression by Anne dunker al.), beginning with the assessment of the client's interpersonal inventory of important past and present relationships; develop a case formulation linking depression to grief, interpersonal role disputes, role transitions, and/or interpersonal deficits). Assign behavioral experiments in which depressive automatic thoughts are treated as hypotheses/prediction, reality-based alternative hypotheses/prediction are generated, and both are tested against the client's past, present, and/or future experiences. Conduct Problem-Solving Therapy (see Problem-Solving Therapy by Francisco and Nezu) using techniques such as psychoeducation, modeling, and role-playing to teach client problem-solving skills (i.e., defining a problem specifically, generating possible solutions, evaluating the pros and cons of each solution, selecting and implementing a plan of action, evaluating the efficacy of the plan, accepting or revising the plan); role-play application of the problem-solving skill to a real life issue (or assign Applying Problem-Solving to Interpersonal Conflict in the Adult Psychotherapy Homework Planner by Jenniffer). Teach conflict resolution skills (e.g., empathy, active listening, I messages, respectful communication, assertiveness without aggression, compromise); use psychoeducation, modeling, role-playing, and rehearsal to work through several current conflicts; assign homework exercises; review and repeat so as to integrate their use into the client's  life. Discuss with the client the distinction between a lapse and relapse, associating a lapse with a rather common, temporary setback that may involve, for example, re-experiencing a depressive thought and/or urge to withdraw or avoid (perhaps as related to some loss or conflict) and a relapse as a sustained return to a pattern of depressive thinking and feeling usually accompanied by interpersonal withdrawal and/or avoidance. Identify and rehearse with the client the management of future situations or circumstances in which lapses could occur. Consistent with the treatment model, discuss how cognitive, behavioral, interpersonal, and/or other factors (e.g., family history) contribute to depression. Assess the manifestation of chronic pain, its history, current status, triggers, and methods of coping (see The Handbook of Pain Assessment by Corinne armin Sow). Teach the client self-monitoring of his/her symptoms; ask the client to keep a pain journal that records time of day, where and what he/she was doing, the severity of stress at the time, the severity of, and what was done to alleviate the pain (or assign Pain and Stress Journal in the Adult Psychotherapy Homework Planner by [facility]); process the journal with the client to increase understanding of the nature of the pain, cognitive, affective, and behavioral triggers, and the positive or negative effects of the coping strategies he/she is currently using. Teach the client relaxation skills (e.g., progressive muscle relaxation, guided imagery, slow diaphragmatic breathing) or mindfulness meditation, explaining the rationale and how to apply these skills to his/her daily life (see New Directions in Progressive Muscle Relaxation by Thornell Collier, and Hazlett-Stevens). Assign the client to read about progressive muscle relaxation and other calming strategies in relevant books or treatment manuals (e.g., The Relaxation and Stress Reduction Workbook by  Nicholaus Aw, and [place]; Living Beyond Your Pain by Doreene and [place]). Explore the client's schema and self-talk that mediate his/her pain response, challenging the biases, assisting him/her in generating thoughts that correct for the biases, facilitate coping, and build confidence in managing pain. Use cognitive therapy techniques to help the client change his/her view of their pain and suffering from overwhelming to manageable. Teach  the client specific coping skills based on an assessment of need (e.g., problem-solving, social/communication, conflict resolution, goal-setting). Assist the client in reframing thoughts about his/her life as one that has many positive elements outside of the pain; ask him/her to list positive aspects of himself/herself as well as his life circumstances (or assign Positive Self-Talk and/or What's Good about Me and My Life? in the Adult Psychotherapy Homework Planner by Jenniffer).   Diagnosis:MDD (major depressive disorder), recurrent episode, moderate (HCC)   Generalized anxiety disorder   Plan:  -pt provided resources to try and get support for her brother ALMEDA request info on any [facility] programming) -next session will Tuesday, December 08, 2023 at 12pm virtual.

## 2023-11-10 NOTE — Progress Notes (Signed)
   Crystal Diaz, [facility]

## 2023-11-17 ENCOUNTER — Telehealth (HOSPITAL_COMMUNITY): Admitting: Psychiatry

## 2023-11-18 ENCOUNTER — Telehealth (HOSPITAL_COMMUNITY): Admitting: Psychiatry

## 2023-11-20 ENCOUNTER — Telehealth (HOSPITAL_COMMUNITY): Admitting: Psychiatry

## 2023-11-27 ENCOUNTER — Encounter (HOSPITAL_COMMUNITY): Payer: Self-pay | Admitting: Psychiatry

## 2023-11-27 ENCOUNTER — Telehealth (HOSPITAL_COMMUNITY): Admitting: Psychiatry

## 2023-11-27 VITALS — Wt 275.0 lb

## 2023-11-27 DIAGNOSIS — F410 Panic disorder [episodic paroxysmal anxiety] without agoraphobia: Secondary | ICD-10-CM

## 2023-11-27 DIAGNOSIS — F331 Major depressive disorder, recurrent, moderate: Secondary | ICD-10-CM

## 2023-11-27 DIAGNOSIS — F411 Generalized anxiety disorder: Secondary | ICD-10-CM | POA: Diagnosis not present

## 2023-11-27 MED ORDER — FLUOXETINE HCL 20 MG PO CAPS: 20.0000 mg | ORAL_CAPSULE | Freq: Every day | ORAL | 1 refills | Status: DC

## 2023-11-27 MED ORDER — ARIPIPRAZOLE 5 MG PO TABS: 5.0000 mg | ORAL_TABLET | Freq: Every day | ORAL | 1 refills | Status: DC

## 2023-11-27 NOTE — Progress Notes (Signed)
  Health MD Virtual Progress Note   Patient Location: In Car Provider Location: Home Office  I connect with patient by video and verified that I am speaking with correct person by using two identifiers. I discussed the limitations of evaluation and management by telemedicine and the availability of in person appointments. I also discussed with the patient that there may be a patient responsible charge related to this service. The patient expressed understanding and agreed to proceed.  Crystal Diaz [id number] 33 y.o.  11/27/2023 11:48 AM  History of Present Illness:  Patient is evaluated by video session.  She reported a lot of things change since the last visit.  She is now moved to a new city which is 2-1/2 hours away.  Her 48 year old son is in treatment residential facility and not having a good time.  She is getting phone call from facility because son is acting up.  She is very frustrated with the staff because she is not able to get in touch with son.  She also engaged in all living with the fianc.  Patient told relationship is going okay but sometimes have difficulty understanding patient's problem.  She also sad because she has to give up the dog when she moved.  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  Patient told she thought that she will like the new job but patient dying is not helping her situation.  She reported a lot of anxiety, irritability, crying spells, mood swings and not sleeping very well.  She denies any active or passive suicidal thoughts or homicidal thoughts.  She is trying to lose weight however has not been successful.  She thought initially when she stopped the medication she will lose weight but it did not happen.  She is in therapy with Kathy Colyer and also trying to get in touch with organization called [facility] but they are not helpful.  She like to go back on medication.  She is having outbursts, panic attack, irritability.  Past  Psychiatric History: Denies any history of suicidal attempt, inpatient treatment, psychosis, hallucination, PTSD. Tried Zoloft , Effexor  and Lexapro  from PCP but they were ineffective. Lamictal  helped but caused rash. Had IOP in Feb 2025.   Past Medical History:  Diagnosis Date   Anemia    Anxiety    Depression 01/2018   Encounter for birth control 03/23/2018   GERD (gastroesophageal reflux disease)    Headache    Lumbar radiculopathy    PCOS (polycystic ovarian syndrome)    Plantar fasciitis, bilateral     Outpatient Encounter Medications as of 11/27/2023  Medication Sig   ARIPiprazole  (ABILIFY ) 15 MG tablet Take 0.5 tablets (7.5 mg total) by mouth daily. (Patient not taking: Reported on 07/15/2023)   clonazePAM  (KLONOPIN ) 0.5 MG tablet Take 1 tablet (0.5 mg total) by mouth 3 (three) times daily as needed.   cyanocobalamin  (VITAMIN B12) 1000 MCG/ML injection Inject 1 mL (1,000 mcg total) into the muscle every 30 (thirty) days. (Patient not taking: Reported on 07/15/2023)   desogestrel -ethinyl estradiol  (MIRCETTE ) 0.15-0.02/0.01 MG (21/5) tablet Take 1 tablet by mouth daily. (Patient not taking: Reported on 07/15/2023)   FLUoxetine  (PROZAC ) 20 MG capsule Take 1 capsule (20 mg total) by mouth daily. (Patient not taking: Reported on 07/15/2023)   ketoconazole  (NIZORAL ) 2 % cream Apply 1 Application topically 2 (two) times daily. (Patient not taking: Reported on 07/15/2023)   lidocaine  (XYLOCAINE ) 5 % ointment Apply 1 Application topically as needed. (Patient not taking:  Reported on 07/15/2023)   medroxyPROGESTERone  (PROVERA ) 10 MG tablet Take 1 tablet (10 mg total) by mouth daily. (Patient not taking: Reported on 07/15/2023)   pantoprazole  (PROTONIX ) 40 MG tablet Take 1 tablet (40 mg total) by mouth daily for heartburn   Prenatal Vit-Fe Fumarate-FA (PRENATAL VITAMIN) 27-0.8 MG TABS Take 1 tablet by mouth daily. (Patient not taking: Reported on 07/15/2023)   propranolol  (INDERAL ) 10 MG tablet Take 1 tablet  (10 mg total) by mouth daily. (Patient not taking: Reported on 07/15/2023)   SYRINGE-NEEDLE, DISP, 3 ML (B-D 3CC LUER-LOK SYR 23GX1) 23G X 1 3 ML MISC Use once monthly with vitamin B12 as directed (Patient not taking: Reported on 07/15/2023)   Vitamin D , Ergocalciferol , (DRISDOL ) 1.25 MG (50000 UNIT) CAPS capsule Take 1 capsule (50,000 Units total) by mouth once a week for 12 weeks (Patient not taking: Reported on 07/15/2023)   No facility-administered encounter medications on file as of 11/27/2023.    No results found for this or any previous visit (from the past 2160 hours).   Psychiatric Specialty Exam: Physical Exam  Review of Systems  Weight 275 lb (124.7 kg).There is no height or weight on file to calculate BMI.  General Appearance: Casual  Eye Contact:  Good  Speech:  Normal Rate  Volume:  Normal  Mood:  Anxious, Depressed, Dysphoric, and Irritable  Affect:  Constricted and Depressed  Thought Process:  Descriptions of Associations: Intact  Orientation:  Full (Time, Place, and Person)  Thought Content:  Rumination  Suicidal Thoughts:  No  Homicidal Thoughts:  No  Memory:  Immediate;   Good Recent;   Good Remote;   Good  Judgement:  Fair  Insight:  Shallow  Psychomotor Activity:  Decreased  Concentration:  Concentration: Fair and Attention Span: Fair  Recall:  Good  Fund of Knowledge:  Good  Language:  Good  Akathisia:  No  Handed:  Right  AIMS (if indicated):     Assets:  Communication Skills Desire for Improvement Housing Talents/Skills Transportation  ADL's:  Intact  Cognition:  WNL  Sleep:  poor       07/10/2023   12:26 PM 01/28/2023    1:18 PM 01/26/2023   12:22 PM 01/12/2023    8:19 AM 02/18/2022    9:48 AM  Depression screen PHQ 2/9  Decreased Interest 2 3 3  0 2  Down, Depressed, Hopeless 2 3 2  0 2  PHQ - 2 Score 4 6 5  0 4  Altered sleeping 2 3 3  3   Tired, decreased energy 2 3 2  2   Change in appetite 2 2 2  2   Feeling bad or failure about yourself  2  3 2  2   Trouble concentrating 1 2 1  1   Moving slowly or fidgety/restless 0 1 0  0  Suicidal thoughts 1 3 1  1   PHQ-9 Score 14  23  16   15    Difficult doing work/chores Not difficult at all Very difficult Very difficult  Extremely dIfficult     Data saved with a previous flowsheet row definition    Assessment/Plan: MDD (major depressive disorder), recurrent episode, moderate (HCC) - Plan: FLUoxetine  (PROZAC ) 20 MG capsule, ARIPiprazole  (ABILIFY ) 5 MG tablet  GAD (generalized anxiety disorder) - Plan: FLUoxetine  (PROZAC ) 20 MG capsule  Panic attacks - Plan: FLUoxetine  (PROZAC ) 20 MG capsule  Patient is a 33 year old African-American female with history of major depressive disorder, panic attacks and anxiety disorder.  Reviewed psychosocial stressors.  Multiple stressors  as 13 year old son is in residential facility, recently started new job and engaged.  Moved to new place which is 2-1/2-hour away from her her son.  She decided not to go back to medication.  She used to take Abilify  7.5 mg, Prozac  20 mg, propranolol  10 mg and Klonopin  as needed.  Recommend start low-dose Abilify  and Prozac  to see how it works.  Will start Abilify  5 mg daily and Prozac  20 mg daily.  We will monitor and if needed may consider adding the medication in the future.  I encouraged to have more frequent visit with Nathanel Coombs.  Discussed safety concerns and anytime having active suicidal thoughts or homicidal thought that she need to call 911 or go to local emergency room.  Follow-up in 6 to 8 weeks.   Follow Up Instructions:     I discussed the assessment and treatment plan with the patient. The patient was provided an opportunity to ask questions and all were answered. The patient agreed with the plan and demonstrated an understanding of the instructions.   The patient was advised to call back or seek an in-person evaluation if the symptoms worsen or if the condition fails to improve as  anticipated.    Collaboration of Care: Other provider involved in patient's care AEB notes are available in epic to review  Patient/Guardian was advised Release of Information must be obtained prior to any record release in order to collaborate their care with an outside provider. Patient/Guardian was advised if they have not already done so to contact the registration department to sign all necessary forms in order for us  to release information regarding their care.   Consent: Patient/Guardian gives verbal consent for treatment and assignment of benefits for services provided during this visit. Patient/Guardian expressed understanding and agreed to proceed.     Total encounter time 26 minutes which includes face-to-face time, chart reviewed, care coordination, order entry and documentation during this encounter.   Note: This document was prepared by [employer] voice dictation technology and any errors that results from this process are unintentional.    Leni ONEIDA Client, MD 11/27/2023

## 2023-12-08 ENCOUNTER — Ambulatory Visit (INDEPENDENT_AMBULATORY_CARE_PROVIDER_SITE_OTHER): Admitting: Professional

## 2023-12-08 ENCOUNTER — Encounter: Payer: Self-pay | Admitting: Professional

## 2023-12-08 DIAGNOSIS — F411 Generalized anxiety disorder: Secondary | ICD-10-CM

## 2023-12-08 DIAGNOSIS — F331 Major depressive disorder, recurrent, moderate: Secondary | ICD-10-CM

## 2023-12-08 NOTE — Progress Notes (Signed)
 [facility] Counselor/Therapist Progress Note   Patient ID: Crystal Diaz, MRN: [id number],     Date: 12/08/2023   Time Spent: 28 minutes 12-1228pm   Treatment Type: Individual Therapy   Risk Assessment: Danger to Self:  No Self-injurious Behavior: No Danger to Others: No   Subjective: This session was held via video teletherapy. The patient consented to video teletherapy and was located in her car during this session. She is aware it is the responsibility of the patient to secure confidentiality on her end of the session. The provider was in a private home office for the duration of this session.    The patient arrived on time for her Caregility appointment.    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 Curry and he placed her on Abilify  and Prozac  to  address her anxiety -they talked about ongoing therapy and both agree that she should continue -pt would like to attend 1/mo -pt notices her anxiety was not as great in our session having to revisit topics that cause the anxiety to increase  Treatment Plan Problems: Unipolar Depression, Anxiety, Chronic Pain, Grief / Loss Unresolved Symptoms: Excessive and/or unrealistic worry that is difficult to control occurring more days than not for at least 6 months about a number of events or activities. Hypervigilance (e.g., feeling constantly on edge, experiencing concentration difficulties, having trouble falling or staying asleep, exhibiting a general state of irritability). Experiences pain beyond the normal healing process (six months or more) that significantly limits physical activities. Uses increased amounts of medications with little, if any, pain relief. Experiences back or neck pain, interstitial cystitis, or diabetic neuropathy. Has decreased or stopped activities such as work, household chores, socializing, exercise, sex, or other pleasurable activities because of pain. Experiences an increase in general physical discomfort (e.g., fatigue, night sweats, insomnia, muscle tension, body aches). Exhibits signs and symptoms of depression. Makes many complaintive, depressive statements like I can't do what I used to; No one understands me; Why me?; When will this go away?; I can't take this pain anymore; and I can't go on. Thoughts dominated by loss coupled with poor concentration, tearful spells, and confusion about the future. Serial losses in life (i.e., deaths, divorces, jobs) that led to depression and discouragement. Lack of appetite, weight loss, and/or  insomnia as well as other depression signs that occurred since the loss. Strong emotional response of sadness exhibited when losses are discussed. Feelings of guilt that not enough was done for the lost significant other, or an  unreasonable belief of having contributed to the death of the significant other. Depressed or irritable mood. Decrease or loss of appetite. Diminished interest in or enjoyment of activities. Psychomotor agitation or retardation. Sleeplessness or hypersomnia. Lack of energy. Social withdrawal. Feelings of hopelessness, worthlessness, or inappropriate guilt. Low self-esteem. Unresolved grief issues. History of chronic or recurrent depression for which the client has taken antidepressant medication, been hospitalized, had outpatient treatment, or had a course of electroconvulsive therapy. Goals: Reduce overall frequency, intensity, and duration of the anxiety so that daily functioning is not impaired. Stabilize anxiety level while increasing ability to function on a daily basis. Resolve the core conflict that is the source of anxiety. Enhance ability to effectively cope with the full variety of life's worries and anxieties. Learn and implement coping skills that result in a reduction of anxiety and worry, and improved daily functioning. Acquire and utilize the necessary pain management skills. Find relief from pain and build renewed contentment and joy in performing activities of everyday life. Begin a healthy grieving process around the loss. Develop an awareness of how the avoidance of grieving has affected life and begin the healing process. Complete the process of letting go of the lost significant other. Alleviate depressive symptoms and return to previous level of effective functioning. Recognize, accept, and cope with feelings of depression. Develop healthy thinking patterns and beliefs about self, others, and the world that lead to the alleviation and help prevent the relapse of depression. Develop healthy interpersonal relationships that lead to the alleviation and help prevent the relapse of depression. Appropriately grieve the loss in order to normalize mood and to return to  previously adaptive level of functioning. Objectives target date for all objectives is 03/01/2024 Describe situations, thoughts, feelings, and actions associated with anxieties and worries, their impact on functioning, and attempts to resolve them.    70 Verbalize an understanding of the cognitive, physiological, and behavioral components of anxiety and its treatment.     80 Learn and implement calming skills to reduce overall anxiety and manage anxiety symptoms.     80 Verbalize an understanding of the role that cognitive biases play in excessive irrational worry and persistent anxiety symptoms.     70 Identify, challenge, and replace biased, fearful self-talk with positive, realistic, and empowering self-talk.     80 Learn and implement problem-solving strategies for realistically addressing worries.70 Identify and engage in pleasant activities on a daily basis.     60 Describe the nature of, history of, impact of, and understood causes of chronic pain. 90 Identify and monitor specific pain triggers.     90 Learn and implement calming skills such as relaxation, biofeedback, or mindfulness meditation to ease pain.     80 Identify, challenge, and change maladaptive thoughts and beliefs about pain and pain management and replace them with more adaptive thoughts and beliefs.     80 Learn and implement specific coping skills as well as when and how to use them to manage pain and its consequences.     80 Engage in positive self-talk as an alternative to the depressing, negative thoughts about self and the world.     70 Tell in detail the story of the current loss that is triggering symptoms.     100 Identify  what stages of grief have been experienced in the continuum of the grieving process.     80 Begin verbalizing feelings associated with the loss.     80 Attend a grief/loss support group.     0 REMOVE PER PT//klc Identify how avoiding dealing with loss has negatively impacted life.   60 Decrease  unrealistic thoughts, statements, and feelings of being responsible for the loss.     100 Express thoughts and feelings about the deceased that went unexpressed while the deceased was alive.     90 Report decreased time spent each day focusing on the loss.     90 Describe current and past experiences with depression including their impact on functioning and attempts to resolve it.     90 Verbalize an accurate understanding of depression.     80 Identify and replace thoughts and beliefs that support depression.     80 Learn and implement behavioral strategies to overcome depression.     80 Identify important people in life, past and present, and describe the quality, good and poor, of those relationships.     90 Learn and implement problem-solving and decision-making skills.     90 Learn and implement conflict resolution skills to resolve interpersonal problems.     90 Learn and implement relapse prevention skills.   100 Interventions: Use empathy, compassion, and support, allowing the client to tell in detail the story of his/her recent loss. Ask the client to elaborate in an autobiography the circumstances, feelings, and effects of the losses in him/her; assess the characteristics of the loss (e.g., type, suddenness, trauma), previous functioning, current functioning, and coping style. Ask the client to list ways that avoidance of grieving has negatively impacted his/her life. Use a cognitive therapy approach to identify the client's bias toward thoughts of personal responsibility for the loss and replace them with factual, reality-based thoughts (or assign Negative Thoughts Trigger Negative Feelings in the Adult Psychotherapy Homework Planner by Jenniffer). Conduct an empty-chair exercise with the client where he/she focuses on expressing to the lost loved one imagined in the chair what he/she never said while that loved one was alive. Assign the client to visit the grave of the lost loved one to  talk to the deceased and express his/her feelings. Ask the client to write a letter to the lost person describing his/her fond memories and/or painful and regretful memories, and how he/she currently feels life (or assign Dear _____: A Letter to a Lost Loved One in the Adult Psychotherapy Homework Planner by [facility]); process the letter in session. Assign the client to write to the deceased loved one with a special focus on his/her feelings associated with the last meaningful contact with that person. Suggest that the client set aside a specific time-limited period each day to focus on mourning his/her loss. After each day's time is up, the client will resume regular activities and postpone grieving thoughts until the next scheduled time. For example, mourning times could include putting on dark clothing and/or sad music; clothing would be changed when the allotted time is up. Educate the client on the stages of the grieving process and answer any questions he/she may have. Assist the client in identifying the stages of grief that he/she has experienced and which stage he/she is presently working through. Ask the client to bring pictures or mementos connected with his/her loss to a session and talk about them (or assign Creating a Patent Examiner in the Adult Psychotherapy Administrator, Arts by Jenniffer).  Assist the client in identifying and expressing feelings connected with his/her loss. Ask the client to attend a grief/loss support group and report to the therapist how he/she felt about attending. Ask the client to describe his/her past experiences of anxiety and their impact on functioning; assess the focus, excessiveness, and uncontrollability of the worry and the type, frequency, intensity, and duration of his/her anxiety symptoms (consider using a structured interview such as The Anxiety Disorders Interview Schedule-Adult Version). Explore the client's schema and self-talk that mediate his/her  fear response; assist him/her in challenging the biases; replace the distorted messages with reality-based alternatives and positive, realistic self-talk that will increase his/her self-confidence in coping with irrational fears (see Cognitive Therapy of Anxiety Disorders by Gretta armin Mon). Teach the client problem-solving strategies involving specifically defining a problem, generating options for addressing it, evaluating the pros and cons of each option, selecting and implementing an optional action, and reevaluating and refining the action (or assign Applying Problem-Solving to Interpersonal Conflict in the Adult Psychotherapy Homework Planner by Jenniffer). Engage the client in behavioral activation, increasing the client's contact with sources of reward, identifying processes that inhibit activation, and teaching skills to solve life problems (or assign Identify and Schedule Pleasant Activities in the Adult Psychotherapy Homework Planner by Jenniffer); use behavioral techniques such as instruction, rehearsal, role-playing, role reversal as needed to assist adoption into the client's daily life; reinforce success. Discuss how generalized anxiety typically involves excessive worry about unrealistic threats, various bodily expressions of tension, overarousal, and hypervigilance, and avoidance of what is threatening that interact to maintain the problem (see Mastery of Your Anxiety and Worry: Therapist Guide by Venson River, and Barlow; Treating Generalized Anxiety Disorder by Rygh and Red). Discuss how treatment targets worry, anxiety symptoms, and avoidance to help the client manage worry effectively, reduce overarousal, and eliminate unnecessary avoidance. Teach the client calming/relaxation skills (e.g., applied relaxation, progressive muscle relaxation, cue controlled relaxation; mindful breathing; biofeedback) and how to discriminate better between relaxation and tension; teach the client how  to apply these skills to his/her daily life (e.g., New Directions in Progressive Muscle Relaxation by Thornell Collier, and Hazlett-Stevens; Treating Generalized Anxiety Disorder by Rygh and Red). Assign the client to read about progressive muscle relaxation and other calming strategies in relevant books or treatment manuals (e.g., Progressive Relaxation Training by Thornell and Collier; Mastery of Your Anxiety and Worry: Workbook by River armin Given). Assist the client in analyzing his/her worries by examining potential biases such as the probability of the negative expectation occurring, the real consequences of it occurring, his/her ability to control the outcome, the worst possible outcome, and his/her ability to accept it (see Analyze the Probability of a Feared Event in the Adult Psychotherapy Homework Planner by Jenniffer; Cognitive Therapy of Anxiety Disorders by Gretta armin Mon). Discuss examples demonstrating that unrealistic worry typically overestimates the probability of threats and underestimates or overlooks the client's ability to manage realistic demands (or assign Past Successful Anxiety Coping in the Adult Psychotherapy Homework Planner by Jenniffer). Encourage the client to share his/her thoughts and feelings of depression; express empathy and build rapport while identifying primary cognitive, behavioral, interpersonal, or other contributors to depression. Assign the client to self-monitor thoughts, feelings, and actions in daily journal (e.g., Negative Thoughts Trigger Negative Feelings in the Adult Psychotherapy Homework Planner by Jenniffer; Daily Record of Dysfunctional Thoughts in Cognitive Therapy of Depression by Mon Candida Gentry and Shona); process the journal material to challenge depressive thinking patterns and replace them with reality-based  thoughts. Facilitate and reinforce the client's shift from biased depressive self-talk and beliefs to reality-based cognitive  messages that enhance self-confidence and increase adaptive actions (see Positive Self-Talk in the Adult Psychotherapy Homework Planner by Jenniffer). Engage the client in behavioral activation, increasing his/her activity level and contact with sources of reward, while identifying processes that inhibit activation (see Behavioral Activation for Depression by Loleta, Dimidjian, and Herman-Dunn; or assign Identify and Schedule Pleasant Activities in the Adult Psychotherapy Homework Planner by [facility]); use behavioral techniques such as instruction, rehearsal, role-playing, role reversal, as needed, to facilitate activity in the client's daily life; reinforce success. Conduct Interpersonal Therapy (see Interpersonal Psychotherapy of Depression by Anne dunker al.), beginning with the assessment of the client's interpersonal inventory of important past and present relationships; develop a case formulation linking depression to grief, interpersonal role disputes, role transitions, and/or interpersonal deficits). Assign behavioral experiments in which depressive automatic thoughts are treated as hypotheses/prediction, reality-based alternative hypotheses/prediction are generated, and both are tested against the client's past, present, and/or future experiences. Conduct Problem-Solving Therapy (see Problem-Solving Therapy by Francisco and Nezu) using techniques such as psychoeducation, modeling, and role-playing to teach client problem-solving skills (i.e., defining a problem specifically, generating possible solutions, evaluating the pros and cons of each solution, selecting and implementing a plan of action, evaluating the efficacy of the plan, accepting or revising the plan); role-play application of the problem-solving skill to a real life issue (or assign Applying Problem-Solving to Interpersonal Conflict in the Adult Psychotherapy Homework Planner by Jenniffer). Teach conflict resolution skills (e.g.,  empathy, active listening, I messages, respectful communication, assertiveness without aggression, compromise); use psychoeducation, modeling, role-playing, and rehearsal to work through several current conflicts; assign homework exercises; review and repeat so as to integrate their use into the client's life. Discuss with the client the distinction between a lapse and relapse, associating a lapse with a rather common, temporary setback that may involve, for example, re-experiencing a depressive thought and/or urge to withdraw or avoid (perhaps as related to some loss or conflict) and a relapse as a sustained return to a pattern of depressive thinking and feeling usually accompanied by interpersonal withdrawal and/or avoidance. Identify and rehearse with the client the management of future situations or circumstances in which lapses could occur. Consistent with the treatment model, discuss how cognitive, behavioral, interpersonal, and/or other factors (e.g., family history) contribute to depression. Assess the manifestation of chronic pain, its history, current status, triggers, and methods of coping (see The Handbook of Pain Assessment by Corinne armin Sow). Teach the client self-monitoring of his/her symptoms; ask the client to keep a pain journal that records time of day, where and what he/she was doing, the severity of stress at the time, the severity of, and what was done to alleviate the pain (or assign Pain and Stress Journal in the Adult Psychotherapy Homework Planner by [facility]); process the journal with the client to increase understanding of the nature of the pain, cognitive, affective, and behavioral triggers, and the positive or negative effects of the coping strategies he/she is currently using. Teach the client relaxation skills (e.g., progressive muscle relaxation, guided imagery, slow diaphragmatic breathing) or mindfulness meditation, explaining the rationale and how to apply these skills  to his/her daily life (see New Directions in Progressive Muscle Relaxation by Thornell Collier, and Hazlett-Stevens). Assign the client to read about progressive muscle relaxation and other calming strategies in relevant books or treatment manuals (e.g., The Relaxation and Stress Reduction Workbook by Nicholaus Aw, and Thayer; Living Beyond  Your Pain by Doreene and Lundgren). Explore the client's schema and self-talk that mediate his/her pain response, challenging the biases, assisting him/her in generating thoughts that correct for the biases, facilitate coping, and build confidence in managing pain. Use cognitive therapy techniques to help the client change his/her view of their pain and suffering from overwhelming to manageable. Teach the client specific coping skills based on an assessment of need (e.g., problem-solving, social/communication, conflict resolution, goal-setting). Assist the client in reframing thoughts about his/her life as one that has many positive elements outside of the pain; ask him/her to list positive aspects of himself/herself as well as his life circumstances (or assign Positive Self-Talk and/or What's Good about Me and My Life? in the Adult Psychotherapy Homework Planner by Jenniffer).   Diagnosis:MDD (major depressive disorder), recurrent episode, moderate (HCC)   Generalized anxiety disorder   Plan:  -pt would like to continue one time per month check ins on Fridays since that is her day off -next session will Friday, January 07, 2024 at [facility].

## 2024-01-01 ENCOUNTER — Ambulatory Visit (INDEPENDENT_AMBULATORY_CARE_PROVIDER_SITE_OTHER): Payer: Self-pay | Admitting: Professional

## 2024-01-01 ENCOUNTER — Encounter: Payer: Self-pay | Admitting: Professional

## 2024-01-01 DIAGNOSIS — F331 Major depressive disorder, recurrent, moderate: Secondary | ICD-10-CM

## 2024-01-01 DIAGNOSIS — F439 Reaction to severe stress, unspecified: Secondary | ICD-10-CM

## 2024-01-01 DIAGNOSIS — F411 Generalized anxiety disorder: Secondary | ICD-10-CM

## 2024-01-01 NOTE — Progress Notes (Signed)
 "   [facility] Counselor/Therapist Progress Note   Patient ID: Crystal Diaz, MRN: [id number],     Date: 01/01/2024   Time Spent: 42 minutes 903-945am   Treatment Type: Individual Therapy   Risk Assessment: Danger to Self:  No Self-injurious Behavior: No Danger to Others: No   Subjective: This session was held via video teletherapy. The patient consented to video teletherapy and was located in her car during this session. She is aware it is the responsibility of the patient to secure confidentiality on her end of the session. The provider was in a private home office for the duration of this session.    The patient arrived on time for her Caregility appointment.    Issues addressed: 1-incarcerated brother Frederic -saw brother last Friday for an inperson visit -pt reports she could visibly see him continue to struggle with his schizophrenia -he was responding to visual hallucinations -she questioned him when he said you wanna fight was directed at her -in her mind she thought she needed to get up and leave -she was on a chair only 4 ft apart and she felt nervous -she started to cry and told Frederic she was sorry he was going through this -she asked him if she wanted her to leave and he did not respond -she knew that was the illness talking and that it was not her brother -she made the decision to get up and leave and when she turned around he was just sitting there -she called her grandmother to talk to her and her grandmother reminded her he is all she has -pt feels concerned about her ability to keep mentally healthy and have him in her life -she reached out to Amanda, the mental health professional and she does think that something will be done -he has lost over one hundred pounds since incarcerated and is not medicated which was where the weight gain came from -sometimes he will get inappropriate to a partner about sexual contact between them and he never talks  about sexual things with her brother -right now she is not going in person, she has been talking to him via phone -she would like to see him but would like to have the heavy glass between them 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 going well -she is adapting but is noticing how many of her patients are dying and it is challenging -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 -pt likes the career choice, it is the schedule that she doesn't enjoy -end goal would be to work 8-5 -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, Research (physical Sciences) -is okay with son's discharge but does not appear as excited -pt verbalized her disappointment that he has not responded positively -he worries about the previous behaviors 4-son -discharging from his inpatient placement -he is excited about coming home and she is excited as well -she does have some worry but misses him so much -she will pick him up at 2pm and she has three hours of road time 5-mother's death anniversary on the 01/15/2024-she is trying to think of the fireworks as a celebration of her life -pt still feels sad that her mother died alone -the new job is part of her healing process  Treatment Plan Problems: Unipolar Depression, Anxiety, Chronic Pain, Grief / Loss Unresolved Symptoms: Excessive and/or unrealistic worry that is difficult to control  occurring more days than not for at least 6 months about a number of events or activities. Hypervigilance (e.g., feeling constantly on edge, experiencing concentration difficulties, having trouble falling or staying asleep, exhibiting a general state of irritability). Experiences pain beyond the normal healing process (six months or more) that significantly limits physical activities. Uses increased amounts of medications with little, if any, pain relief. Experiences back or neck pain, interstitial cystitis,  or diabetic neuropathy. Has decreased or stopped activities such as work, household chores, socializing, exercise, sex, or other pleasurable activities because of pain. Experiences an increase in general physical discomfort (e.g., fatigue, night sweats, insomnia, muscle tension, body aches). Exhibits signs and symptoms of depression. Makes many complaintive, depressive statements like I can't do what I used to; No one understands me; Why me?; When will this go away?; I can't take this pain anymore; and I can't go on. Thoughts dominated by loss coupled with poor concentration, tearful spells, and confusion about the future. Serial losses in life (i.e., deaths, divorces, jobs) that led to depression and discouragement. Lack of appetite, weight loss, and/or insomnia as well as other depression signs that occurred since the loss. Strong emotional response of sadness exhibited when losses are discussed. Feelings of guilt that not enough was done for the lost significant other, or an unreasonable belief of having contributed to the death of the significant other. Depressed or irritable mood. Decrease or loss of appetite. Diminished interest in or enjoyment of activities. Psychomotor agitation or retardation. Sleeplessness or hypersomnia. Lack of energy. Social withdrawal. Feelings of hopelessness, worthlessness, or inappropriate guilt. Low self-esteem. Unresolved grief issues. History of chronic or recurrent depression for which the client has taken antidepressant medication, been hospitalized, had outpatient treatment, or had a course of electroconvulsive therapy. Goals: Reduce overall frequency, intensity, and duration of the anxiety so that daily functioning is not impaired. Stabilize anxiety level while increasing ability to function on a daily basis. Resolve the core conflict that is the source of anxiety. Enhance ability to effectively cope with the full variety of life's worries  and anxieties. Learn and implement coping skills that result in a reduction of anxiety and worry, and improved daily functioning. Acquire and utilize the necessary pain management skills. Find relief from pain and build renewed contentment and joy in performing activities of everyday life. Begin a healthy grieving process around the loss. Develop an awareness of how the avoidance of grieving has affected life and begin the healing process. Complete the process of letting go of the lost significant other. Alleviate depressive symptoms and return to previous level of effective functioning. Recognize, accept, and cope with feelings of depression. Develop healthy thinking patterns and beliefs about self, others, and the world that lead to the alleviation and help prevent the relapse of depression. Develop healthy interpersonal relationships that lead to the alleviation and help prevent the relapse of depression. Appropriately grieve the loss in order to normalize mood and to return to previously adaptive level of functioning. Objectives target date for all objectives is 03/01/2024 Describe situations, thoughts, feelings, and actions associated with anxieties and worries, their impact on functioning, and attempts to resolve them.    70 Verbalize an understanding of the cognitive, physiological, and behavioral components of anxiety and its treatment.     80 Learn and implement calming skills to reduce overall anxiety and manage anxiety symptoms.     80 Verbalize an understanding of the role that cognitive biases play in excessive irrational worry and persistent anxiety symptoms.  70 Identify, challenge, and replace biased, fearful self-talk with positive, realistic, and empowering self-talk.     80 Learn and implement problem-solving strategies for realistically addressing worries.70 Identify and engage in pleasant activities on a daily basis.     60 Describe the nature of, history of, impact of, and  understood causes of chronic pain. 90 Identify and monitor specific pain triggers.     90 Learn and implement calming skills such as relaxation, biofeedback, or mindfulness meditation to ease pain.     80 Identify, challenge, and change maladaptive thoughts and beliefs about pain and pain management and replace them with more adaptive thoughts and beliefs.     80 Learn and implement specific coping skills as well as when and how to use them to manage pain and its consequences.     80 Engage in positive self-talk as an alternative to the depressing, negative thoughts about self and the world.     70 Tell in detail the story of the current loss that is triggering symptoms.     100 Identify what stages of grief have been experienced in the continuum of the grieving process.     80 Begin verbalizing feelings associated with the loss.     80 Attend a grief/loss support group.     0 REMOVE PER PT//klc Identify how avoiding dealing with loss has negatively impacted life.   60 Decrease unrealistic thoughts, statements, and feelings of being responsible for the loss.     100 Express thoughts and feelings about the deceased that went unexpressed while the deceased was alive.     90 Report decreased time spent each day focusing on the loss.     90 Describe current and past experiences with depression including their impact on functioning and attempts to resolve it.     90 Verbalize an accurate understanding of depression.     80 Identify and replace thoughts and beliefs that support depression.     80 Learn and implement behavioral strategies to overcome depression.     80 Identify important people in life, past and present, and describe the quality, good and poor, of those relationships.     90 Learn and implement problem-solving and decision-making skills.     90 Learn and implement conflict resolution skills to resolve interpersonal problems.     90 Learn and implement relapse prevention skills.    100 Interventions: Use empathy, compassion, and support, allowing the client to tell in detail the story of his/her recent loss. Ask the client to elaborate in an autobiography the circumstances, feelings, and effects of the losses in him/her; assess the characteristics of the loss (e.g., type, suddenness, trauma), previous functioning, current functioning, and coping style. Ask the client to list ways that avoidance of grieving has negatively impacted his/her life. Use a cognitive therapy approach to identify the client's bias toward thoughts of personal responsibility for the loss and replace them with factual, reality-based thoughts (or assign Negative Thoughts Trigger Negative Feelings in the Adult Psychotherapy Homework Planner by Jenniffer). Conduct an empty-chair exercise with the client where he/she focuses on expressing to the lost loved one imagined in the chair what he/she never said while that loved one was alive. Assign the client to visit the grave of the lost loved one to talk to the deceased and express his/her feelings. Ask the client to write a letter to the lost person describing his/her fond memories and/or painful and regretful memories, and how he/she currently feels life (  or assign Dear _____: A Letter to a Lost Loved One in the Adult Psychotherapy Homework Planner by [facility]); process the letter in session. Assign the client to write to the deceased loved one with a special focus on his/her feelings associated with the last meaningful contact with that person. Suggest that the client set aside a specific time-limited period each day to focus on mourning his/her loss. After each day's time is up, the client will resume regular activities and postpone grieving thoughts until the next scheduled time. For example, mourning times could include putting on dark clothing and/or sad music; clothing would be changed when the allotted time is up. Educate the client on the stages of the  grieving process and answer any questions he/she may have. Assist the client in identifying the stages of grief that he/she has experienced and which stage he/she is presently working through. Ask the client to bring pictures or mementos connected with his/her loss to a session and talk about them (or assign Creating a Patent Examiner in the Adult Psychotherapy Administrator, Arts by Jenniffer). Assist the client in identifying and expressing feelings connected with his/her loss. Ask the client to attend a grief/loss support group and report to the therapist how he/she felt about attending. Ask the client to describe his/her past experiences of anxiety and their impact on functioning; assess the focus, excessiveness, and uncontrollability of the worry and the type, frequency, intensity, and duration of his/her anxiety symptoms (consider using a structured interview such as The Anxiety Disorders Interview Schedule-Adult Version). Explore the client's schema and self-talk that mediate his/her fear response; assist him/her in challenging the biases; replace the distorted messages with reality-based alternatives and positive, realistic self-talk that will increase his/her self-confidence in coping with irrational fears (see Cognitive Therapy of Anxiety Disorders by Gretta armin Mon). Teach the client problem-solving strategies involving specifically defining a problem, generating options for addressing it, evaluating the pros and cons of each option, selecting and implementing an optional action, and reevaluating and refining the action (or assign Applying Problem-Solving to Interpersonal Conflict in the Adult Psychotherapy Homework Planner by Jenniffer). Engage the client in behavioral activation, increasing the client's contact with sources of reward, identifying processes that inhibit activation, and teaching skills to solve life problems (or assign Identify and Schedule Pleasant Activities in the Adult  Psychotherapy Homework Planner by Jenniffer); use behavioral techniques such as instruction, rehearsal, role-playing, role reversal as needed to assist adoption into the client's daily life; reinforce success. Discuss how generalized anxiety typically involves excessive worry about unrealistic threats, various bodily expressions of tension, overarousal, and hypervigilance, and avoidance of what is threatening that interact to maintain the problem (see Mastery of Your Anxiety and Worry: Therapist Guide by Venson River, and Barlow; Treating Generalized Anxiety Disorder by Rygh and Red). Discuss how treatment targets worry, anxiety symptoms, and avoidance to help the client manage worry effectively, reduce overarousal, and eliminate unnecessary avoidance. Teach the client calming/relaxation skills (e.g., applied relaxation, progressive muscle relaxation, cue controlled relaxation; mindful breathing; biofeedback) and how to discriminate better between relaxation and tension; teach the client how to apply these skills to his/her daily life (e.g., New Directions in Progressive Muscle Relaxation by Thornell Collier, and Hazlett-Stevens; Treating Generalized Anxiety Disorder by Rygh and Red). Assign the client to read about progressive muscle relaxation and other calming strategies in relevant books or treatment manuals (e.g., Progressive Relaxation Training by Thornell and Collier; Mastery of Your Anxiety and Worry: Workbook by River armin Given). Assist the client in  analyzing his/her worries by examining potential biases such as the probability of the negative expectation occurring, the real consequences of it occurring, his/her ability to control the outcome, the worst possible outcome, and his/her ability to accept it (see Analyze the Probability of a Feared Event in the Adult Psychotherapy Homework Planner by Jenniffer; Cognitive Therapy of Anxiety Disorders by Gretta armin Mon). Discuss examples  demonstrating that unrealistic worry typically overestimates the probability of threats and underestimates or overlooks the client's ability to manage realistic demands (or assign Past Successful Anxiety Coping in the Adult Psychotherapy Homework Planner by Jenniffer). Encourage the client to share his/her thoughts and feelings of depression; express empathy and build rapport while identifying primary cognitive, behavioral, interpersonal, or other contributors to depression. Assign the client to self-monitor thoughts, feelings, and actions in daily journal (e.g., Negative Thoughts Trigger Negative Feelings in the Adult Psychotherapy Homework Planner by Jenniffer; Daily Record of Dysfunctional Thoughts in Cognitive Therapy of Depression by Mon Candida Gentry and Shona); process the journal material to challenge depressive thinking patterns and replace them with reality-based thoughts. Facilitate and reinforce the client's shift from biased depressive self-talk and beliefs to reality-based cognitive messages that enhance self-confidence and increase adaptive actions (see Positive Self-Talk in the Adult Psychotherapy Homework Planner by Jenniffer). Engage the client in behavioral activation, increasing his/her activity level and contact with sources of reward, while identifying processes that inhibit activation (see Behavioral Activation for Depression by Loleta, Dimidjian, and Herman-Dunn; or assign Identify and Schedule Pleasant Activities in the Adult Psychotherapy Homework Planner by [facility]); use behavioral techniques such as instruction, rehearsal, role-playing, role reversal, as needed, to facilitate activity in the client's daily life; reinforce success. Conduct Interpersonal Therapy (see Interpersonal Psychotherapy of Depression by Anne dunker al.), beginning with the assessment of the client's interpersonal inventory of important past and present relationships; develop a case formulation linking  depression to grief, interpersonal role disputes, role transitions, and/or interpersonal deficits). Assign behavioral experiments in which depressive automatic thoughts are treated as hypotheses/prediction, reality-based alternative hypotheses/prediction are generated, and both are tested against the client's past, present, and/or future experiences. Conduct Problem-Solving Therapy (see Problem-Solving Therapy by Francisco and Nezu) using techniques such as psychoeducation, modeling, and role-playing to teach client problem-solving skills (i.e., defining a problem specifically, generating possible solutions, evaluating the pros and cons of each solution, selecting and implementing a plan of action, evaluating the efficacy of the plan, accepting or revising the plan); role-play application of the problem-solving skill to a real life issue (or assign Applying Problem-Solving to Interpersonal Conflict in the Adult Psychotherapy Homework Planner by Jenniffer). Teach conflict resolution skills (e.g., empathy, active listening, I messages, respectful communication, assertiveness without aggression, compromise); use psychoeducation, modeling, role-playing, and rehearsal to work through several current conflicts; assign homework exercises; review and repeat so as to integrate their use into the client's life. Discuss with the client the distinction between a lapse and relapse, associating a lapse with a rather common, temporary setback that may involve, for example, re-experiencing a depressive thought and/or urge to withdraw or avoid (perhaps as related to some loss or conflict) and a relapse as a sustained return to a pattern of depressive thinking and feeling usually accompanied by interpersonal withdrawal and/or avoidance. Identify and rehearse with the client the management of future situations or circumstances in which lapses could occur. Consistent with the treatment model, discuss how cognitive,  behavioral, interpersonal, and/or other factors (e.g., family history) contribute to depression. Assess the manifestation of chronic pain, its history, current  status, triggers, and methods of coping (see The Handbook of Pain Assessment by Corinne armin Sow). Teach the client self-monitoring of his/her symptoms; ask the client to keep a pain journal that records time of day, where and what he/she was doing, the severity of stress at the time, the severity of, and what was done to alleviate the pain (or assign Pain and Stress Journal in the Adult Psychotherapy Homework Planner by [facility]); process the journal with the client to increase understanding of the nature of the pain, cognitive, affective, and behavioral triggers, and the positive or negative effects of the coping strategies he/she is currently using. Teach the client relaxation skills (e.g., progressive muscle relaxation, guided imagery, slow diaphragmatic breathing) or mindfulness meditation, explaining the rationale and how to apply these skills to his/her daily life (see New Directions in Progressive Muscle Relaxation by Thornell Collier, and Hazlett-Stevens). Assign the client to read about progressive muscle relaxation and other calming strategies in relevant books or treatment manuals (e.g., The Relaxation and Stress Reduction Workbook by Nicholaus Aw, and Pelzer; Living Beyond Your Pain by Doreene and Argentina). Explore the client's schema and self-talk that mediate his/her pain response, challenging the biases, assisting him/her in generating thoughts that correct for the biases, facilitate coping, and build confidence in managing pain. Use cognitive therapy techniques to help the client change his/her view of their pain and suffering from overwhelming to manageable. Teach the client specific coping skills based on an assessment of need (e.g., problem-solving, social/communication, conflict resolution, goal-setting). Assist the  client in reframing thoughts about his/her life as one that has many positive elements outside of the pain; ask him/her to list positive aspects of himself/herself as well as his life circumstances (or assign Positive Self-Talk and/or What's Good about Me and My Life? in the Adult Psychotherapy Homework Planner by Jenniffer).   Diagnosis: MDD (major depressive disorder), recurrent episode, moderate (HCC)   Generalized anxiety disorder   Plan:  -next session will Wednesday, January 27, 2024 at 1pm virtual.   "

## 2024-01-27 ENCOUNTER — Ambulatory Visit: Payer: Self-pay | Admitting: Professional

## 2024-01-27 ENCOUNTER — Encounter: Payer: Self-pay | Admitting: Professional

## 2024-01-27 DIAGNOSIS — F411 Generalized anxiety disorder: Secondary | ICD-10-CM

## 2024-01-27 DIAGNOSIS — F331 Major depressive disorder, recurrent, moderate: Secondary | ICD-10-CM

## 2024-01-27 NOTE — Progress Notes (Signed)
 "   [facility] Counselor/Therapist Progress Note   Patient ID: Crystal Diaz, MRN: [id number],     Date: 01/27/2024   Time Spent: 30 minutes 103-133pm   Treatment Type: Individual Therapy   Risk Assessment: Danger to Self:  No Self-injurious Behavior: No Danger to Others: No   Subjective: This session was held via video teletherapy. The patient consented to video teletherapy and was located in her car during this session. She is aware it is the responsibility of the patient to secure confidentiality on her end of the session. The provider was in a private home office for the duration of this session.    The patient arrived on time for her Caregility appointment.    Issues addressed: 1-incarcerated brother Frederic -saw brother last Friday for an inperson visit -pt reports she could visibly see him continue to struggle with his schizophrenia -he was responding to visual hallucinations -she questioned him when he said you wanna fight was directed at her -in her mind she thought she needed to get up and leave -she was on a chair only 4 ft apart and she felt nervous -she started to cry and told Frederic she was sorry he was going through this -she asked him if she wanted her to leave and he did not respond -she knew that was the illness talking and that it was not her brother -she made the decision to get up and leave and when she turned around he was just sitting there -she called her grandmother to talk to her and her grandmother reminded her he is all she has -pt feels concerned about her ability to keep mentally healthy and have him in her life -she reached out to Amanda, the mental health professional and she does think that something will be done -he has lost over one hundred pounds since incarcerated and is not medicated which was where the weight gain came from -sometimes he will get inappropriate to a partner about sexual contact between them and he never talks  about sexual things with her brother -right now she is not going in person, she has been talking to him via phone -she would like to see him but would like to have the heavy glass between them 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[place] is going well -she is adapting but is noticing how many of her patients are dying and it is challenging -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 -pt likes the career choice, it is the schedule that she doesn't enjoy -end goal would be to work 8-5 -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, Research (physical Sciences) -is okay with son's discharge but does not appear as excited -pt verbalized her disappointment that he has not responded positively -he worries about the previous behaviors 4-son -he's home and everything is going okay 5-she got Christmas off and was excited to spent home with her son He entered school this week and thus far things are goo -there has been no acting out behavior -he has had a bit of attitude -prior to his admission to facility it was the testing boundaries that became so out of control -she thinks that what breaking the patio glass -having another female in the home to listen to and being told what to do by his mother's fiance 6-fiance Nequan -appears jealous of relationship with son -he says she is not calm when  she talks to him but is for her son -her fiance is argumentative and believes he is right and there is no -he wants to be treated differently than her son and she treats people for who they are -Nequan doesn't like when she gets upset and  -she would like to see him as more open and honest in the moment and has done so on one occasion -pt recognizes all this is new and needs to be worked on and that if she has to leave she will get her own apartment -pt needs for Nequan to be more understanding of    Treatment Plan Problems: Unipolar  Depression, Anxiety, Chronic Pain, Grief / Loss Unresolved Symptoms: Excessive and/or unrealistic worry that is difficult to control occurring more days than not for at least 6 months about a number of events or activities. Hypervigilance (e.g., feeling constantly on edge, experiencing concentration difficulties, having trouble falling or staying asleep, exhibiting a general state of irritability). Experiences pain beyond the normal healing process (six months or more) that significantly limits physical activities. Uses increased amounts of medications with little, if any, pain relief. Experiences back or neck pain, interstitial cystitis, or diabetic neuropathy. Has decreased or stopped activities such as work, household chores, socializing, exercise, sex, or other pleasurable activities because of pain. Experiences an increase in general physical discomfort (e.g., fatigue, night sweats, insomnia, muscle tension, body aches). Exhibits signs and symptoms of depression. Makes many complaintive, depressive statements like I can't do what I used to; No one understands me; Why me?; When will this go away?; I can't take this pain anymore; and I can't go on. Thoughts dominated by loss coupled with poor concentration, tearful spells, and confusion about the future. Serial losses in life (i.e., deaths, divorces, jobs) that led to depression and discouragement. Lack of appetite, weight loss, and/or insomnia as well as other depression signs that occurred since the loss. Strong emotional response of sadness exhibited when losses are discussed. Feelings of guilt that not enough was done for the lost significant other, or an unreasonable belief of having contributed to the death of the significant other. Depressed or irritable mood. Decrease or loss of appetite. Diminished interest in or enjoyment of activities. Psychomotor agitation or retardation. Sleeplessness or hypersomnia. Lack of  energy. Social withdrawal. Feelings of hopelessness, worthlessness, or inappropriate guilt. Low self-esteem. Unresolved grief issues. History of chronic or recurrent depression for which the client has taken antidepressant medication, been hospitalized, had outpatient treatment, or had a course of electroconvulsive therapy. Goals: Reduce overall frequency, intensity, and duration of the anxiety so that daily functioning is not impaired. Stabilize anxiety level while increasing ability to function on a daily basis. Resolve the core conflict that is the source of anxiety. Enhance ability to effectively cope with the full variety of life's worries and anxieties. Learn and implement coping skills that result in a reduction of anxiety and worry, and improved daily functioning. Acquire and utilize the necessary pain management skills. Find relief from pain and build renewed contentment and joy in performing activities of everyday life. Begin a healthy grieving process around the loss. Develop an awareness of how the avoidance of grieving has affected life and begin the healing process. Complete the process of letting go of the lost significant other. Alleviate depressive symptoms and return to previous level of effective functioning. Recognize, accept, and cope with feelings of depression. Develop healthy thinking patterns and beliefs about self, others, and the world that lead to the alleviation and  help prevent the relapse of depression. Develop healthy interpersonal relationships that lead to the alleviation and help prevent the relapse of depression. Appropriately grieve the loss in order to normalize mood and to return to previously adaptive level of functioning. Objectives target date for all objectives is 03/01/2024 Describe situations, thoughts, feelings, and actions associated with anxieties and worries, their impact on functioning, and attempts to resolve them.    70 Verbalize an  understanding of the cognitive, physiological, and behavioral components of anxiety and its treatment.     80 Learn and implement calming skills to reduce overall anxiety and manage anxiety symptoms.     80 Verbalize an understanding of the role that cognitive biases play in excessive irrational worry and persistent anxiety symptoms.     70 Identify, challenge, and replace biased, fearful self-talk with positive, realistic, and empowering self-talk.     80 Learn and implement problem-solving strategies for realistically addressing worries.70 Identify and engage in pleasant activities on a daily basis.     60 Describe the nature of, history of, impact of, and understood causes of chronic pain. 90 Identify and monitor specific pain triggers.     90 Learn and implement calming skills such as relaxation, biofeedback, or mindfulness meditation to ease pain.     80 Identify, challenge, and change maladaptive thoughts and beliefs about pain and pain management and replace them with more adaptive thoughts and beliefs.     80 Learn and implement specific coping skills as well as when and how to use them to manage pain and its consequences.     80 Engage in positive self-talk as an alternative to the depressing, negative thoughts about self and the world.     70 Tell in detail the story of the current loss that is triggering symptoms.     100 Identify what stages of grief have been experienced in the continuum of the grieving process.     80 Begin verbalizing feelings associated with the loss.     80 Attend a grief/loss support group.     0 REMOVE PER PT//klc Identify how avoiding dealing with loss has negatively impacted life.   60 Decrease unrealistic thoughts, statements, and feelings of being responsible for the loss.     100 Express thoughts and feelings about the deceased that went unexpressed while the deceased was alive.     90 Report decreased time spent each day focusing on the loss.      90 Describe current and past experiences with depression including their impact on functioning and attempts to resolve it.     90 Verbalize an accurate understanding of depression.     80 Identify and replace thoughts and beliefs that support depression.     80 Learn and implement behavioral strategies to overcome depression.     80 Identify important people in life, past and present, and describe the quality, good and poor, of those relationships.     90 Learn and implement problem-solving and decision-making skills.     90 Learn and implement conflict resolution skills to resolve interpersonal problems.     90 Learn and implement relapse prevention skills.   100 Interventions: Use empathy, compassion, and support, allowing the client to tell in detail the story of his/her recent loss. Ask the client to elaborate in an autobiography the circumstances, feelings, and effects of the losses in him/her; assess the characteristics of the loss (e.g., type, suddenness, trauma), previous functioning, current functioning, and coping style. Ask  the client to list ways that avoidance of grieving has negatively impacted his/her life. Use a cognitive therapy approach to identify the client's bias toward thoughts of personal responsibility for the loss and replace them with factual, reality-based thoughts (or assign Negative Thoughts Trigger Negative Feelings in the Adult Psychotherapy Homework Planner by Jenniffer). Conduct an empty-chair exercise with the client where he/she focuses on expressing to the lost loved one imagined in the chair what he/she never said while that loved one was alive. Assign the client to visit the grave of the lost loved one to talk to the deceased and express his/her feelings. Ask the client to write a letter to the lost person describing his/her fond memories and/or painful and regretful memories, and how he/she currently feels life (or assign Dear _____: A Letter to a Lost Loved  One in the Adult Psychotherapy Homework Planner by [facility]); process the letter in session. Assign the client to write to the deceased loved one with a special focus on his/her feelings associated with the last meaningful contact with that person. Suggest that the client set aside a specific time-limited period each day to focus on mourning his/her loss. After each day's time is up, the client will resume regular activities and postpone grieving thoughts until the next scheduled time. For example, mourning times could include putting on dark clothing and/or sad music; clothing would be changed when the allotted time is up. Educate the client on the stages of the grieving process and answer any questions he/she may have. Assist the client in identifying the stages of grief that he/she has experienced and which stage he/she is presently working through. Ask the client to bring pictures or mementos connected with his/her loss to a session and talk about them (or assign Creating a Patent Examiner in the Adult Psychotherapy Administrator, Arts by Jenniffer). Assist the client in identifying and expressing feelings connected with his/her loss. Ask the client to attend a grief/loss support group and report to the therapist how he/she felt about attending. Ask the client to describe his/her past experiences of anxiety and their impact on functioning; assess the focus, excessiveness, and uncontrollability of the worry and the type, frequency, intensity, and duration of his/her anxiety symptoms (consider using a structured interview such as The Anxiety Disorders Interview Schedule-Adult Version). Explore the client's schema and self-talk that mediate his/her fear response; assist him/her in challenging the biases; replace the distorted messages with reality-based alternatives and positive, realistic self-talk that will increase his/her self-confidence in coping with irrational fears (see Cognitive Therapy of Anxiety  Disorders by Gretta armin Mon). Teach the client problem-solving strategies involving specifically defining a problem, generating options for addressing it, evaluating the pros and cons of each option, selecting and implementing an optional action, and reevaluating and refining the action (or assign Applying Problem-Solving to Interpersonal Conflict in the Adult Psychotherapy Homework Planner by Jenniffer). Engage the client in behavioral activation, increasing the client's contact with sources of reward, identifying processes that inhibit activation, and teaching skills to solve life problems (or assign Identify and Schedule Pleasant Activities in the Adult Psychotherapy Homework Planner by Jenniffer); use behavioral techniques such as instruction, rehearsal, role-playing, role reversal as needed to assist adoption into the client's daily life; reinforce success. Discuss how generalized anxiety typically involves excessive worry about unrealistic threats, various bodily expressions of tension, overarousal, and hypervigilance, and avoidance of what is threatening that interact to maintain the problem (see Mastery of Your Anxiety and Worry: Therapist Guide by Venson River,  and Barlow; Treating Generalized Anxiety Disorder by Rygh and Red). Discuss how treatment targets worry, anxiety symptoms, and avoidance to help the client manage worry effectively, reduce overarousal, and eliminate unnecessary avoidance. Teach the client calming/relaxation skills (e.g., applied relaxation, progressive muscle relaxation, cue controlled relaxation; mindful breathing; biofeedback) and how to discriminate better between relaxation and tension; teach the client how to apply these skills to his/her daily life (e.g., New Directions in Progressive Muscle Relaxation by Thornell Collier, and Hazlett-Stevens; Treating Generalized Anxiety Disorder by Rygh and Red). Assign the client to read about progressive muscle  relaxation and other calming strategies in relevant books or treatment manuals (e.g., Progressive Relaxation Training by Thornell and Collier; Mastery of Your Anxiety and Worry: Workbook by Richarda armin Given). Assist the client in analyzing his/her worries by examining potential biases such as the probability of the negative expectation occurring, the real consequences of it occurring, his/her ability to control the outcome, the worst possible outcome, and his/her ability to accept it (see Analyze the Probability of a Feared Event in the Adult Psychotherapy Homework Planner by Jenniffer; Cognitive Therapy of Anxiety Disorders by Gretta armin Mon). Discuss examples demonstrating that unrealistic worry typically overestimates the probability of threats and underestimates or overlooks the client's ability to manage realistic demands (or assign Past Successful Anxiety Coping in the Adult Psychotherapy Homework Planner by Jenniffer). Encourage the client to share his/her thoughts and feelings of depression; express empathy and build rapport while identifying primary cognitive, behavioral, interpersonal, or other contributors to depression. Assign the client to self-monitor thoughts, feelings, and actions in daily journal (e.g., Negative Thoughts Trigger Negative Feelings in the Adult Psychotherapy Homework Planner by Jenniffer; Daily Record of Dysfunctional Thoughts in Cognitive Therapy of Depression by Mon Candida Gentry and Shona); process the journal material to challenge depressive thinking patterns and replace them with reality-based thoughts. Facilitate and reinforce the client's shift from biased depressive self-talk and beliefs to reality-based cognitive messages that enhance self-confidence and increase adaptive actions (see Positive Self-Talk in the Adult Psychotherapy Homework Planner by Jenniffer). Engage the client in behavioral activation, increasing his/her activity level and contact with sources  of reward, while identifying processes that inhibit activation (see Behavioral Activation for Depression by Loleta, Dimidjian, and Herman-Dunn; or assign Identify and Schedule Pleasant Activities in the Adult Psychotherapy Homework Planner by [facility]); use behavioral techniques such as instruction, rehearsal, role-playing, role reversal, as needed, to facilitate activity in the client's daily life; reinforce success. Conduct Interpersonal Therapy (see Interpersonal Psychotherapy of Depression by Anne dunker al.), beginning with the assessment of the client's interpersonal inventory of important past and present relationships; develop a case formulation linking depression to grief, interpersonal role disputes, role transitions, and/or interpersonal deficits). Assign behavioral experiments in which depressive automatic thoughts are treated as hypotheses/prediction, reality-based alternative hypotheses/prediction are generated, and both are tested against the client's past, present, and/or future experiences. Conduct Problem-Solving Therapy (see Problem-Solving Therapy by Francisco and Nezu) using techniques such as psychoeducation, modeling, and role-playing to teach client problem-solving skills (i.e., defining a problem specifically, generating possible solutions, evaluating the pros and cons of each solution, selecting and implementing a plan of action, evaluating the efficacy of the plan, accepting or revising the plan); role-play application of the problem-solving skill to a real life issue (or assign Applying Problem-Solving to Interpersonal Conflict in the Adult Psychotherapy Homework Planner by Jenniffer). Teach conflict resolution skills (e.g., empathy, active listening, I messages, respectful communication, assertiveness without aggression, compromise); use psychoeducation, modeling, role-playing, and rehearsal to work  through several current conflicts; assign homework exercises; review and  repeat so as to integrate their use into the client's life. Discuss with the client the distinction between a lapse and relapse, associating a lapse with a rather common, temporary setback that may involve, for example, re-experiencing a depressive thought and/or urge to withdraw or avoid (perhaps as related to some loss or conflict) and a relapse as a sustained return to a pattern of depressive thinking and feeling usually accompanied by interpersonal withdrawal and/or avoidance. Identify and rehearse with the client the management of future situations or circumstances in which lapses could occur. Consistent with the treatment model, discuss how cognitive, behavioral, interpersonal, and/or other factors (e.g., family history) contribute to depression. Assess the manifestation of chronic pain, its history, current status, triggers, and methods of coping (see The Handbook of Pain Assessment by Corinne armin Sow). Teach the client self-monitoring of his/her symptoms; ask the client to keep a pain journal that records time of day, where and what he/she was doing, the severity of stress at the time, the severity of, and what was done to alleviate the pain (or assign Pain and Stress Journal in the Adult Psychotherapy Homework Planner by [facility]); process the journal with the client to increase understanding of the nature of the pain, cognitive, affective, and behavioral triggers, and the positive or negative effects of the coping strategies he/she is currently using. Teach the client relaxation skills (e.g., progressive muscle relaxation, guided imagery, slow diaphragmatic breathing) or mindfulness meditation, explaining the rationale and how to apply these skills to his/her daily life (see New Directions in Progressive Muscle Relaxation by Thornell Collier, and Hazlett-Stevens). Assign the client to read about progressive muscle relaxation and other calming strategies in relevant books or treatment manuals  (e.g., The Relaxation and Stress Reduction Workbook by Nicholaus Aw, and [place]; Living Beyond Your Pain by Doreene and Argentina). Explore the client's schema and self-talk that mediate his/her pain response, challenging the biases, assisting him/her in generating thoughts that correct for the biases, facilitate coping, and build confidence in managing pain. Use cognitive therapy techniques to help the client change his/her view of their pain and suffering from overwhelming to manageable. Teach the client specific coping skills based on an assessment of need (e.g., problem-solving, social/communication, conflict resolution, goal-setting). Assist the client in reframing thoughts about his/her life as one that has many positive elements outside of the pain; ask him/her to list positive aspects of himself/herself as well as his life circumstances (or assign Positive Self-Talk and/or What's Good about Me and My Life? in the Adult Psychotherapy Homework Planner by Jenniffer).   Diagnosis: MDD (major depressive disorder), recurrent episode, moderate (HCC)   Generalized anxiety disorder   Plan:  -make plans to discuss couples issues with Nequan -complete values self-assessment and discuss with partner; negotiate significant difference -next session will Friday, January 26, 2024 at 10am virtual.   "

## 2024-01-29 ENCOUNTER — Encounter (HOSPITAL_COMMUNITY): Payer: Self-pay | Admitting: Psychiatry

## 2024-01-29 ENCOUNTER — Telehealth (HOSPITAL_BASED_OUTPATIENT_CLINIC_OR_DEPARTMENT_OTHER): Admitting: Psychiatry

## 2024-01-29 VITALS — Wt 265.0 lb

## 2024-01-29 DIAGNOSIS — F331 Major depressive disorder, recurrent, moderate: Secondary | ICD-10-CM | POA: Diagnosis not present

## 2024-01-29 DIAGNOSIS — F410 Panic disorder [episodic paroxysmal anxiety] without agoraphobia: Secondary | ICD-10-CM | POA: Diagnosis not present

## 2024-01-29 DIAGNOSIS — F411 Generalized anxiety disorder: Secondary | ICD-10-CM | POA: Diagnosis not present

## 2024-01-29 MED ORDER — ARIPIPRAZOLE 5 MG PO TABS: 5.0000 mg | ORAL_TABLET | Freq: Every day | ORAL | 2 refills | Status: AC

## 2024-01-29 MED ORDER — FLUOXETINE HCL 20 MG PO CAPS: 20.0000 mg | ORAL_CAPSULE | Freq: Every day | ORAL | 2 refills | Status: AC

## 2024-01-29 NOTE — Progress Notes (Signed)
 " [facility] MD Virtual Progress Note   Patient Location: Home Provider Location: Home Office  I connect with patient by video and verified that I am speaking with correct person by using two identifiers. I discussed the limitations of evaluation and management by telemedicine and the availability of in person appointments. I also discussed with the patient that there may be a patient responsible charge related to this service. The patient expressed understanding and agreed to proceed.  Crystal Diaz [id number] 34 y.o.  01/29/2024 11:59 AM  History of Present Illness:  Patient is evaluated by video session.  She started taking Abilify  5 mg and she noticed much improvement in her mood, irritability, depression and anxiety.  Her 68 year old son is now back and living with her.  But she is pleased that son is getting therapy and taking the medication and following the recommendation and not causing significant behavior problem.  She is happy with the progress.  She also back with her boyfriend and she reported relationship is going well.  She is pleased that having a female figure in the house is very helpful for her son.  Patient is now moved and she really liked her new job.  In the beginning she was stressed but now adjusting better.  She is now working 12 hours a day and 3 days a week.  She is in therapy with Nathanel Coombs.  Sometimes she does uses space talk when she need to vent out.  She reported sleep is not as good but also realize she may need to change the bed.  She is using coping skills when she feels very nervous anxious or having any panic attack.  She had good Christmas as able to take some time off and visited her paternal grandmother who lives 2 hours away.  She is not have to take Klonopin  or propranolol  in a while.  She admitted considering to going back to GLP-1 to consider weight loss.  She tried losing weight but it is not successful.  Past Psychiatric  History: Denies any history of suicidal attempt, inpatient treatment, psychosis, hallucination, PTSD. Tried Zoloft , Effexor  and Lexapro  from PCP but they were ineffective. Lamictal  helped but caused rash. Had IOP in Feb 2025.   Past Medical History:  Diagnosis Date   Anemia    Anxiety    Depression 01/2018   Encounter for birth control 03/23/2018   GERD (gastroesophageal reflux disease)    Headache    Lumbar radiculopathy    PCOS (polycystic ovarian syndrome)    Plantar fasciitis, bilateral     Outpatient Encounter Medications as of 01/29/2024  Medication Sig   ARIPiprazole  (ABILIFY ) 5 MG tablet Take 1 tablet (5 mg total) by mouth daily.   clonazePAM  (KLONOPIN ) 0.5 MG tablet Take 1 tablet (0.5 mg total) by mouth 3 (three) times daily as needed.   cyanocobalamin  (VITAMIN B12) 1000 MCG/ML injection Inject 1 mL (1,000 mcg total) into the muscle every 30 (thirty) days. (Patient not taking: Reported on 07/15/2023)   desogestrel -ethinyl estradiol  (MIRCETTE ) 0.15-0.02/0.01 MG (21/5) tablet Take 1 tablet by mouth daily. (Patient not taking: Reported on 07/15/2023)   FLUoxetine  (PROZAC ) 20 MG capsule Take 1 capsule (20 mg total) by mouth daily.   ketoconazole  (NIZORAL ) 2 % cream Apply 1 Application topically 2 (two) times daily. (Patient not taking: Reported on 07/15/2023)   lidocaine  (XYLOCAINE ) 5 % ointment Apply 1 Application topically as needed. (Patient not taking: Reported on 07/15/2023)   medroxyPROGESTERone  (PROVERA ) 10 MG tablet  Take 1 tablet (10 mg total) by mouth daily. (Patient not taking: Reported on 07/15/2023)   pantoprazole  (PROTONIX ) 40 MG tablet Take 1 tablet (40 mg total) by mouth daily for heartburn   Prenatal Vit-Fe Fumarate-FA (PRENATAL VITAMIN) 27-0.8 MG TABS Take 1 tablet by mouth daily. (Patient not taking: Reported on 07/15/2023)   propranolol  (INDERAL ) 10 MG tablet Take 1 tablet (10 mg total) by mouth daily. (Patient not taking: Reported on 07/15/2023)   SYRINGE-NEEDLE, DISP, 3 ML  (B-D 3CC LUER-LOK SYR 23GX1) 23G X 1 3 ML MISC Use once monthly with vitamin B12 as directed (Patient not taking: Reported on 07/15/2023)   Vitamin D , Ergocalciferol , (DRISDOL ) 1.25 MG (50000 UNIT) CAPS capsule Take 1 capsule (50,000 Units total) by mouth once a week for 12 weeks (Patient not taking: Reported on 07/15/2023)   No facility-administered encounter medications on file as of 01/29/2024.    No results found for this or any previous visit (from the past 2160 hours).   Psychiatric Specialty Exam: Physical Exam  Review of Systems  Weight 265 lb (120.2 kg).There is no height or weight on file to calculate BMI.  General Appearance: Casual  Eye Contact:  Good  Speech:  Normal Rate  Volume:  Normal  Mood:  Euthymic  Affect:  Congruent  Thought Process:  Goal Directed  Orientation:  Full (Time, Place, and Person)  Thought Content:  Rumination  Suicidal Thoughts:  No  Homicidal Thoughts:  No  Memory:  Immediate;   Good Recent;   Good Remote;   Good  Judgement:  Fair  Insight:  Shallow  Psychomotor Activity:  Normal  Concentration:  Concentration: Fair and Attention Span: Fair  Recall:  Good  Fund of Knowledge:  Good  Language:  Good  Akathisia:  No  Handed:  Right  AIMS (if indicated):     Assets:  Communication Skills Desire for Improvement Housing Talents/Skills Transportation  ADL's:  Intact  Cognition:  WNL  Sleep:  fair, may need to change bed       07/10/2023   12:26 PM 01/28/2023    1:18 PM 01/26/2023   12:22 PM 01/12/2023    8:19 AM 02/18/2022    9:48 AM  Depression screen PHQ 2/9  Decreased Interest 2 3 3  0 2  Down, Depressed, Hopeless 2 3 2  0 2  PHQ - 2 Score 4 6 5  0 4  Altered sleeping 2 3 3  3   Tired, decreased energy 2 3 2  2   Change in appetite 2 2 2  2   Feeling bad or failure about yourself  2 3 2  2   Trouble concentrating 1 2 1  1   Moving slowly or fidgety/restless 0 1 0  0  Suicidal thoughts 1 3 1  1   PHQ-9 Score 14  23  16   15    Difficult  doing work/chores Not difficult at all Very difficult Very difficult  Extremely dIfficult     Data saved with a previous flowsheet row definition    Assessment/Plan: MDD (major depressive disorder), recurrent episode, moderate (HCC) - Plan: ARIPiprazole  (ABILIFY ) 5 MG tablet, FLUoxetine  (PROZAC ) 20 MG capsule  GAD (generalized anxiety disorder) - Plan: FLUoxetine  (PROZAC ) 20 MG capsule  Panic attacks - Plan: FLUoxetine  (PROZAC ) 20 MG capsule  Patient is a 34 year old African-American female with history of major depressive disorder, panic attacks and anxiety disorder.  She is doing better overall since started the Abilify .  She now like her job and trying to adjust  better.  She noticed improvement with Abilify .  So far no major concern or side effects.  Her son is back and living with her and she is back in a relationship.  Continue Abilify  5 mg daily, Prozac  20 mg daily.  Has not taken propranolol  and Klonopin  in a while.  She used to take Abilify  7.5 mg but she is comfortable with this dose.  She is going to consult with her primary care to consider GLP-1 to help with weight loss.  Discussed medication side effects and benefits.  Encouraged to continue therapy with Nathanel Coombs. Follow-up in 3 months unless patient need a sooner appointment.   Follow Up Instructions:     I discussed the assessment and treatment plan with the patient. The patient was provided an opportunity to ask questions and all were answered. The patient agreed with the plan and demonstrated an understanding of the instructions.   The patient was advised to call back or seek an in-person evaluation if the symptoms worsen or if the condition fails to improve as anticipated.    Collaboration of Care: Other provider involved in patient's care AEB notes are available in epic to review  Patient/Guardian was advised Release of Information must be obtained prior to any record release in order to collaborate their care with  an outside provider. Patient/Guardian was advised if they have not already done so to contact the registration department to sign all necessary forms in order for us  to release information regarding their care.   Consent: Patient/Guardian gives verbal consent for treatment and assignment of benefits for services provided during this visit. Patient/Guardian expressed understanding and agreed to proceed.     Total encounter time 30 minutes which includes face-to-face time, chart reviewed, care coordination, order entry and documentation during this encounter.   Note: This document was prepared by [employer] voice dictation technology and any errors that results from this process are unintentional.    Leni ONEIDA Client, MD 01/29/2024   "

## 2024-02-11 ENCOUNTER — Ambulatory Visit: Admitting: Professional

## 2024-02-11 ENCOUNTER — Encounter: Payer: Self-pay | Admitting: Professional

## 2024-02-11 DIAGNOSIS — F411 Generalized anxiety disorder: Secondary | ICD-10-CM | POA: Diagnosis not present

## 2024-02-11 DIAGNOSIS — F331 Major depressive disorder, recurrent, moderate: Secondary | ICD-10-CM
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
## 2024-02-18 ENCOUNTER — Ambulatory Visit: Admitting: Professional

## 2024-02-26 ENCOUNTER — Ambulatory Visit: Payer: Self-pay | Admitting: Professional

## 2024-03-25 ENCOUNTER — Ambulatory Visit: Admitting: Professional

## 2024-04-01 ENCOUNTER — Ambulatory Visit: Admitting: Professional

## 2024-04-08 ENCOUNTER — Ambulatory Visit: Admitting: Professional

## 2024-04-29 ENCOUNTER — Telehealth (HOSPITAL_COMMUNITY): Admitting: Psychiatry
